# Patient Record
Sex: Male | Born: 1960
Health system: Southern US, Community
[De-identification: ages and names within clinical notes are randomized; demographics above are authoritative.]

## PROBLEM LIST (undated history)

## (undated) DIAGNOSIS — M1990 Unspecified osteoarthritis, unspecified site: Secondary | ICD-10-CM

## (undated) DIAGNOSIS — F419 Anxiety disorder, unspecified: Secondary | ICD-10-CM

## (undated) DIAGNOSIS — Z720 Tobacco use: Secondary | ICD-10-CM

## (undated) DIAGNOSIS — F32A Depression, unspecified: Secondary | ICD-10-CM

## (undated) DIAGNOSIS — F1910 Other psychoactive substance abuse, uncomplicated: Secondary | ICD-10-CM

## (undated) DIAGNOSIS — Z860100 Personal history of colon polyps, unspecified: Secondary | ICD-10-CM

## (undated) DIAGNOSIS — K219 Gastro-esophageal reflux disease without esophagitis: Secondary | ICD-10-CM

## (undated) DIAGNOSIS — F309 Manic episode, unspecified: Secondary | ICD-10-CM

## (undated) DIAGNOSIS — F329 Major depressive disorder, single episode, unspecified: Secondary | ICD-10-CM

## (undated) DIAGNOSIS — F319 Bipolar disorder, unspecified: Secondary | ICD-10-CM

## (undated) HISTORY — PX: TONSILLECTOMY: SUR1361

## (undated) HISTORY — DX: Tobacco use: Z72.0

## (undated) HISTORY — PX: EYE SURGERY: SHX253

## (undated) HISTORY — DX: Manic episode, unspecified: F30.9

## (undated) HISTORY — DX: Unspecified osteoarthritis, unspecified site: M19.90

## (undated) HISTORY — DX: Anxiety disorder, unspecified: F41.9

## (undated) HISTORY — DX: Major depressive disorder, single episode, unspecified: F32.9

## (undated) HISTORY — DX: Depression, unspecified: F32.A

## (undated) HISTORY — DX: Other psychoactive substance abuse, uncomplicated: F19.10

## (undated) HISTORY — DX: Bipolar disorder, unspecified: F31.9

## (undated) HISTORY — DX: Gastro-esophageal reflux disease without esophagitis: K21.9

## (undated) NOTE — *Deleted (*Deleted)
Patient ID: Austin Cobb, male   DOB: 07/04/1960, 54 y.o.   MRN: [id number]   After hospitalization 10/15-10/21/2021  From discharge summary: Disposition and follow-up:   [name] was discharged from [facility] in [place] condition.  At the hospital follow up visit please address:  1.  AoC HFrEF. TTE on 12/25/19 showing EF 25-30% with global hypokinesis, unchanged from prior. Not candidate for advanced interventions due to medication non-compliance and recent cocaine use. BNP 2812. Diuresed down to dry weight of 61.7kg. Discharged with Sherryll Burger 24-26mg  BID, spironolactone 25mg  daily, lasix 20mg  daily, and coreg 25mg  BID. Will f/u with cardiology, Dr. Bjorn Pippin at [facility], on January 03, 2020. Will establish new PCP at [facility] on January 12, 2020.  Atrial Flutter w/ variable blockade. On eliquis 5mg  BID at home. Initially loaded with digoxin, but due to medication non-compliance history, switched to coreg. Increased coreg to 25mg  BID before finally achieving adequate rate control. Will discharge with coreg 25mg  BID and eliquis 5mg  BID.  Enterobacter aerogenes UTI. Hx of E coli UTI. Initially had asymptomatic bacteriuria as patient was not complaining of symptoms. On 10/19, began complaining of urinary symptoms and had one episode of urethral bleeding (3cc). Repeat urinalysis showing positive nitrites, large leuks, many bacteria. Initially started on keflex 500mg  BID as urine culture showing >100,000 GNR, suspected to be E coli UTI due to history of E coli UTI. After discharge, found to be Enterobacter aerogenes. Will await susceptibilities before sending the appropriate abx to patient's local pharmacy.  Alcohol Use Disorder. Alcoholic cirrhosis vs alcoholic hepatitis. RUQ U/S consistent with possible cirrhosis, also showed dilated common bile duct (6.30mm). Required CIWA with ativan and librium for a couple of days during  admission, but tolerated quick taper. Has not required ativan for the past several days. Liver enzymes significantly decreased since admission. Emphasized avoidance of alcohol consumption.   AKI on CKDIIIa. Baseline creatinine variable between 1.1 - 1.4. Cr 1.53 on admission. Cr 1.37 on day of discharge.   2.  Labs / imaging needed at time of follow-up: CBC, CMP, repeat UA in 1 month.  3.  Pending labs/ test needing follow-up: NA   Hospital Course by problem list: 1.  AoC HFrEF. TTE on 12/25/19 showing EF 25-30% with global hypokinesis, unchanged from prior. Not candidate for advanced interventions due to medication non-compliance and recent cocaine use. BNP 2812. Diuresed down to dry weight of 61.7kg. Able to maintain euvolemia with lasix 20mg  daily and spironolactone 25mg  daily. Discharged with Entresto 24-26mg  BID, spironolactone 25mg  daily, lasix 20mg  daily, and coreg 25mg  BID. Will f/u with cardiology, Dr. Bjorn Pippin at [facility], on January 03, 2020. Will establish new PCP at [facility] on January 12, 2020.  Atrial Flutter w/ variable blockade. On eliquis 5mg  BID at home. Initially loaded with digoxin, but due to medication non-compliance history, switched to coreg. Increased coreg to 25mg  BID before finally achieving adequate rate control. Will discharge with coreg 25mg  BID and eliquis 5mg  BID.  Enterobacter aerogenes UTI. Hx of E coli UTI. Initially had asymptomatic bacteriuria as patient was not complaining of symptoms. On 10/19, began complaining of urinary symptoms and had one episode of urethral bleeding (3cc). Repeat urinalysis showing positive nitrites, large leuks, many bacteria. Initially started on keflex 500mg  BID as urine culture showing >100,000 GNR, suspected to be E coli UTI due to history of E coli UTI. After discharge, found to be Enterobacter aerogenes.  Will await susceptibilities before sending the appropriate abx to patient's local  pharmacy.  Alcohol Use Disorder. Alcoholic cirrhosis vs alcoholic hepatitis. RUQ U/S consistent with possible cirrhosis, also showed dilated common bile duct (6.21mm). Required CIWA with ativan and librium for a couple of days during admission, but tolerated quick taper. Has not required ativan for the past several days. Emphasized avoidance of alcohol consumption.   AKI on CKDIIIa. Baseline creatinine variable between 1.1 - 1.4. Cr 1.53 on admission. Cr 1.37 on day of discharge.

---

## 2005-01-16 ENCOUNTER — Inpatient Hospital Stay (HOSPITAL_COMMUNITY): Admission: EM | Admit: 2005-01-16 | Discharge: 2005-01-21 | Payer: Self-pay | Admitting: Emergency Medicine

## 2008-07-20 ENCOUNTER — Emergency Department (HOSPITAL_COMMUNITY): Admission: EM | Admit: 2008-07-20 | Discharge: 2008-07-20 | Payer: Self-pay | Admitting: Emergency Medicine

## 2010-05-09 ENCOUNTER — Encounter (INDEPENDENT_AMBULATORY_CARE_PROVIDER_SITE_OTHER): Payer: Self-pay | Admitting: *Deleted

## 2010-05-10 ENCOUNTER — Ambulatory Visit: Payer: Self-pay | Admitting: Cardiology

## 2010-05-17 NOTE — Letter (Signed)
Summary: Pre Visit Letter Revised  [facility]  [address] [place], [place] [address]   Phone: [phone]  Fax: [phone]        05/09/2010 MRN: [id number] Austin Cobb [address] [place], [place]  [address]             Procedure Date:  06/07/2010 @ 1:30   Direct colon-Dr. Arlyce Dice   Welcome to the Gastroenterology Division at [facility].    You are scheduled to see a nurse for your pre-procedure visit on 05/15/2010 at 1:30 on the 3rd floor at [facility], [address]  We ask that you try to arrive at our office 15 minutes prior to your appointment time to allow for check-in.  Please take a minute to review the attached form.  If you answer "Yes" to one or more of the questions on the first page, we ask that you call the person listed at your earliest opportunity.  If you answer "No" to all of the questions, please complete the rest of the form and bring it to your appointment.    Your nurse visit will consist of discussing your medical and surgical history, your immediate family medical history, and your medications.   If you are unable to list all of your medications on the form, please bring the medication bottles to your appointment and we will list them.  We will need to be aware of both prescribed and over the counter drugs.  We will need to know exact dosage information as well.    Please be prepared to read and sign documents such as consent forms, a financial agreement, and acknowledgement forms.  If necessary, and with your consent, a friend or relative is welcome to sit-in on the nurse visit with you.  Please bring your insurance card so that we may make a copy of it.  If your insurance requires a referral to see a specialist, please bring your referral form from your primary care physician.  No co-pay is required for this nurse visit.     If you cannot keep your appointment, please call [phone] to cancel or reschedule  prior to your appointment date.  This allows [place] the opportunity to schedule an appointment for another patient in need of care.    Thank you for choosing [facility] for your medical needs.  We appreciate the opportunity to care for you.  Please visit [place] at our website  to learn more about our practice.  Sincerely, The Gastroenterology Division

## 2010-05-30 ENCOUNTER — Ambulatory Visit (AMBULATORY_SURGERY_CENTER): Payer: Medicare Other | Admitting: *Deleted

## 2010-05-30 VITALS — Ht 70.0 in | Wt 172.0 lb

## 2010-05-30 DIAGNOSIS — Z1211 Encounter for screening for malignant neoplasm of colon: Secondary | ICD-10-CM

## 2010-05-30 DIAGNOSIS — M545 Low back pain, unspecified: Secondary | ICD-10-CM

## 2010-05-30 DIAGNOSIS — R109 Unspecified abdominal pain: Secondary | ICD-10-CM

## 2010-05-30 DIAGNOSIS — R1030 Lower abdominal pain, unspecified: Secondary | ICD-10-CM

## 2010-05-30 MED ORDER — PEG-KCL-NACL-NASULF-NA ASC-C 100 G PO SOLR: 1.0000 | Freq: Once | ORAL | Status: AC

## 2010-06-06 ENCOUNTER — Encounter: Payer: Self-pay | Admitting: Gastroenterology

## 2010-06-07 ENCOUNTER — Encounter: Payer: Self-pay | Admitting: Gastroenterology

## 2010-06-07 ENCOUNTER — Ambulatory Visit (AMBULATORY_SURGERY_CENTER): Payer: Medicare Other | Admitting: Gastroenterology

## 2010-06-07 VITALS — BP 122/90 | HR 73 | Temp 97.9°F | Resp 15 | Ht 70.0 in | Wt 170.0 lb

## 2010-06-07 DIAGNOSIS — D126 Benign neoplasm of colon, unspecified: Secondary | ICD-10-CM

## 2010-06-07 DIAGNOSIS — K635 Polyp of colon: Secondary | ICD-10-CM

## 2010-06-07 DIAGNOSIS — Z1211 Encounter for screening for malignant neoplasm of colon: Secondary | ICD-10-CM

## 2010-06-07 NOTE — Patient Instructions (Signed)
Discharge instructions reviewed  Repeat colonoscopy in 3 years  Education material on polyps given

## 2010-06-08 ENCOUNTER — Telehealth: Payer: Self-pay | Admitting: *Deleted

## 2010-06-08 NOTE — Telephone Encounter (Signed)

## 2010-06-12 ENCOUNTER — Encounter: Payer: Self-pay | Admitting: Gastroenterology

## 2010-06-12 NOTE — Procedures (Signed)
Summary: Colonoscopy  Patient: [name] Note: All result statuses are Final unless otherwise noted.  Tests: (1) Colonoscopy (COL)   COL Colonoscopy           DONE     [facility]     [address]     [place], [place]  [address]          COLONOSCOPY PROCEDURE REPORT          PATIENT:  Austin Cobb, Austin Cobb  MR#:  [id number]     BIRTHDATE:  December 27, 1960, 49 yrs. old  GENDER:  male          ENDOSCOPIST:  Barbette Hair. Arlyce Dice, MD     Referred by:  Marga Melnick, M.D.          PROCEDURE DATE:  06/07/2010     PROCEDURE:  Colonoscopy with snare polypectomy     ASA CLASS:  Class II     INDICATIONS:  1) Routine Risk Screening          MEDICATIONS:   Fentanyl 100 mcg IV, Versed 10 mg IV, Benadryl 50     mg IV          DESCRIPTION OF PROCEDURE:   After the risks benefits and     alternatives of the procedure were thoroughly explained, informed     consent was obtained.  Digital rectal exam was performed and     revealed no abnormalities.   The LB CF-[id number] endoscope     was introduced through the anus and advanced to the cecum, which     was identified by both the appendix and ileocecal valve, without     limitations.  The quality of the prep was excellent, using     MoviPrep.  The instrument was then slowly withdrawn as the colon     was fully examined.     <<PROCEDUREIMAGES>>          FINDINGS:  Three polyps were found (see image5, image6, and     image7)in the transverse colon. 3 10-40mm sessile polyps - removed     with hot polypectomy snare and submitted to pathology  A sessile     polyp was found in the sigmoid colon. It was 10 mm in size. It was     found 28 cm from the point of entry. Polyp was snared, then     cauterized with monopolar cautery. Retrieval was successful (see     image12). snare polyp  This was otherwise a normal examination of     the colon (see image2, image3, image10, image14, and image16).     Retroflexed views in the rectum  revealed no abnormalities.    The     time to cecum =  3.50  minutes. The scope was then withdrawn (time     =  11.75  min) from the patient and the procedure completed.          COMPLICATIONS:  None          ENDOSCOPIC IMPRESSION:     1) Three polyps in transverse colon     2) 10 mm sessile polyp in the sigmoid colon     3) Otherwise normal examination     RECOMMENDATIONS:     1) Colonoscopy in 3 years b/c of polyposis     2) Sedation with MAC for future procedures          REPEAT EXAM:   3 year(s) Colonoscopy  ______________________________     Barbette Hair Arlyce Dice, MD          CC:          n.     eSIGNED:   Barbette Hair. Guillermo Nehring at 06/07/2010 02:17 PM          Zigmund Gottron, [id number]  Note: An exclamation mark (!) indicates a result that was not dispersed into the flowsheet. Document Creation Date: 06/07/2010 2:17 PM _______________________________________________________________________  (1) Order result status: Final Collection or observation date-time: 06/07/2010 14:06 Requested date-time:  Receipt date-time:  Reported date-time:  Referring Physician:   Ordering Physician: Melvia Heaps [id number]) Specimen Source:  Source: Launa Grill Order Number: [id number] Lab site:

## 2010-06-19 LAB — RAPID URINE DRUG SCREEN, HOSP PERFORMED
Amphetamines: NOT DETECTED
Barbiturates: NOT DETECTED
Benzodiazepines: NOT DETECTED
Cocaine: NOT DETECTED
Opiates: NOT DETECTED
Tetrahydrocannabinol: NOT DETECTED

## 2010-06-19 LAB — CBC
HCT: 41.9 % (ref 39.0–52.0)
Hemoglobin: 14.7 g/dL (ref 13.0–17.0)
MCHC: 35.2 g/dL (ref 30.0–36.0)
MCV: 89.2 fL (ref 78.0–100.0)
Platelets: 217 10*3/uL (ref 150–400)
RBC: 4.7 MIL/uL (ref 4.22–5.81)
RDW: 14.3 % (ref 11.5–15.5)
WBC: 9.3 10*3/uL (ref 4.0–10.5)

## 2010-06-19 LAB — DIFFERENTIAL
Basophils Absolute: 0.1 10*3/uL (ref 0.0–0.1)
Basophils Relative: 1 % (ref 0–1)
Eosinophils Absolute: 0 10*3/uL (ref 0.0–0.7)
Eosinophils Relative: 0 % (ref 0–5)
Lymphocytes Relative: 14 % (ref 12–46)
Lymphs Abs: 1.3 10*3/uL (ref 0.7–4.0)
Monocytes Absolute: 0.5 10*3/uL (ref 0.1–1.0)
Monocytes Relative: 5 % (ref 3–12)
Neutro Abs: 7.4 10*3/uL (ref 1.7–7.7)
Neutrophils Relative %: 79 % — ABNORMAL HIGH (ref 43–77)

## 2010-06-19 LAB — COMPREHENSIVE METABOLIC PANEL
ALT: 38 U/L (ref 0–53)
AST: 106 U/L — ABNORMAL HIGH (ref 0–37)
Albumin: 4.5 g/dL (ref 3.5–5.2)
Alkaline Phosphatase: 84 U/L (ref 39–117)
BUN: 7 mg/dL (ref 6–23)
CO2: 24 mEq/L (ref 19–32)
Calcium: 9.4 mg/dL (ref 8.4–10.5)
Chloride: 94 mEq/L — ABNORMAL LOW (ref 96–112)
Creatinine, Ser: 1.08 mg/dL (ref 0.4–1.5)
GFR calc Af Amer: >60 mL/min (ref >60)
GFR calc non Af Amer: >60 mL/min (ref >60)
Glucose, Bld: 105 mg/dL — ABNORMAL HIGH (ref 70–99)
Potassium: UNDETERMINED mEq/L (ref 3.5–5.1)
Sodium: 133 mEq/L — ABNORMAL LOW (ref 135–145)
Total Bilirubin: 4 mg/dL — ABNORMAL HIGH (ref 0.3–1.2)
Total Protein: UNDETERMINED g/dL (ref 6.0–8.3)

## 2010-06-19 LAB — POTASSIUM: Potassium: 3.7 mEq/L (ref 3.5–5.1)

## 2010-06-19 LAB — ETHANOL: Alcohol, Ethyl (B): <5 mg/dL (ref 0–10)

## 2010-07-10 ENCOUNTER — Encounter: Payer: Self-pay | Admitting: Gastroenterology

## 2010-07-12 ENCOUNTER — Telehealth: Payer: Self-pay | Admitting: *Deleted

## 2010-07-12 NOTE — Telephone Encounter (Signed)
PT STATED PCP IS DAVID HOPPER NOT WILLIAM HOPPER

## 2010-07-12 NOTE — Telephone Encounter (Signed)
Message copied by Merri Ray on Thu Jul 12, 2010  8:59 AM ------      Message from: Melvia Heaps MD      Created: Wed Jul 11, 2010  4:04 PM       That's great news all around.        I will change the PCP      ----- Message -----         From: Pecola Lawless, MD         Sent: 07/11/2010  11:52 AM           To: Louis Meckel, MD            Please have your Nurse verify correct primary care MD. Last night our son Mardelle Matte won cook-off @ "[employer]). Bennette  Sings your praises. " I haven't felt this good & been able to eat well for years", she exclaimed. Thanks, [employer]

## 2010-07-27 NOTE — Discharge Summary (Signed)
Austin Cobb, Austin Cobb             ACCOUNT NO.:  [id number]   MEDICAL RECORD NO.:  [id number]          PATIENT TYPE:  INP   LOCATION:  1404                         FACILITY:  [facility]   PHYSICIAN:  Nelma Rothman, MD   DATE OF BIRTH:  October 30, 1960   DATE OF ADMISSION:  01/16/2005  DATE OF DISCHARGE:  01/21/2005                                 DISCHARGE SUMMARY   DISCHARGE DIAGNOSES:  1.  Alcohol withdrawal with delirium tremens.  2.  Hyponatremia.  3.  Hypokalemia.  4.  Thrombocytopenia likely secondary to alcohol use and cirrhosis.   PROCEDURES:  Abdominal ultrasound on November9 demonstrated prominent liver  size with probable hemangioma in the right lobe of the liver as well as a  contracted gallbladder which was thick-walled without ductal stones,  prominent pancreatic duct measuring 2.5 mm, and a minimal amount of ascites  surrounding the liver.   LABORATORY DATA:  Most recent CBC demonstrated a white blood cell count of  6.2, hemoglobin 11.3, hematocrit 33.1, and platelet count 79.  Reticulocyte  count 1% sodium improved from 123 on admission to 133 in the morning prior  to discharge.  Potassium at that time was 3.2, and creatinine was 0.8.  Liver function tests demonstrated a total bilirubin of 1.8 of which 1.5 was  fractionated as indirect bilirubin, alkaline phosphatase 59, AST 54, ALT 26,  total protein 5.8, albumin 2.7, calcium 7.6.  An LDH level was 173.  TSH  1.550, total iron was 73 with a total iron-binding capacity of 194.  Vitamin  B12 level 549 and folate greater than 20.  Alpha-fetoprotein was 1.7 which  is within normal limits, and a haptoglobin level was 158 which is within  normal limits.  Hepatitis C antibody was negative as was hepatitis B E  antibody.  An alcohol level on admission was less than five.  Urine culture  was negative.   HISTORY AND PHYSICAL:  Austin Cobb is a 50 year old homeless man with  alcoholism who was picked up by the police after he was  found by a bypasser  wandering [place] the side of the highway, appearing confused.   HOSPITAL COURSE AS ARRANGED BY SYSTEMS:  1.  Alcohol withdrawal with delirium tremens.  The patient relates a      longstanding history of alcoholism.  His alcohol level on admission was      undetectable.  He stated that he did not have any money to buy alcohol      for the prior 24 hours, which was [place].  When he was admitted, he was placed on the Ativan      withdrawal protocol, but over the subsequent 48 hours was quite      combative and noncompliant.  His presentation was felt most consistent      with delirium tremens.  He was managed with Ativan standing and as      needed.  This gradually improved over the course of his hospitalization,      as his Ativan was slowly tapered.  2.  Hyponatremia.  This, too, improved over the course of his      hospitalization and was likely secondary to dehydration.  3.  Hepatic hemangioma.  Ultrasound performed on admission demonstrated a      probable hepatic hemangioma, but given his longstanding history of      alcohol use, could not entirely rule out a liver mass.  As the patient      was unable to tolerate an MRI secondary to severe agitation during      delirium tremens, an alpha-fetoprotein level was checked for screening      for hepatocellular carcinoma, which was within normal limits.  This was      felt more likely consistent with a hepatic hemangioma.  It was felt that      he may beneift from having a follow-up MRI performed once his agitation      had improved such that he would tolerate an MRI; however, the patient      left the hosptial prior to this being able to be performed.  4.  Disposition.  Austin Cobb agitation improved greatly over his 6 days      of hospitalization and, in fact, the day prior to discharge, he was      actually quite pleasant, and I had a long conversation with him about       abstinence from alcohol.  He, at that time, declined any assistance in      alcohol cessation.  He strongly wished to be discharged back to the      street where he stated he would start drinking again, as it is the only      thing that makes life tolerable for him.  That evening, I strongly      suggested that he remain in the hospital until our social workers were      able to arrange for possible inpatient alcohol detox and treatment, as      well as mental health evaluation.  He agreed at that time, but it      appears from review of the chart at approximately 2 o'clock a.m. the      following morning, the patient left the hospital against medical advice.      Nelma Rothman, MD  Electronically Signed     RAR/MEDQ  D:  04/01/2005  T:  04/02/2005  Job:  [id number]

## 2010-07-27 NOTE — H&P (Signed)
NAME:  Austin Cobb, Austin Cobb             ACCOUNT NO.:  [id number]   MEDICAL RECORD NO.:  [id number]          PATIENT TYPE:  EMS   LOCATION:  ED                           FACILITY:  [facility]   PHYSICIAN:  Mobolaji B. Bakare, M.D.DATE OF BIRTH:  [birth year]   DATE OF ADMISSION:  01/16/2005  DATE OF DISCHARGE:                                HISTORY & PHYSICAL   PRIMARY CARE PHYSICIAN:  Unassigned.   CHIEF COMPLAINT:  DTs.   HISTORY OF PRESENTING COMPLAINT:  Austin Cobb is a 50 year old male [place] He drinks mainly beer about five to six times  a day. His last drink was yesterday evening. He stated that he went to bed  and woke up this morning with shakiness and pain in his body. He did not  have any alcohol to drink; hence, he migrated to the highway. He was seen by  hematologist who notified 9-1-1 and the patient was picked by EMS.   The patient states that he drinks alcohol to keep his arthritis in check. He  states he has osteoarthritis involving his neck, knees. He denies any  vomiting, abdominal pain, and nausea. He has been having loose BMs  two  times today, but no abdominal pain. He denies chest pain, shortness of  breath, headaches, dysuria, urgency, or increased frequency or micturition.  There is no skin rash.   REVIEW OF SYSTEMS:  As in the HPI.   PAST MEDICAL HISTORY:  Osteoarthritis involving the knees.   PAST SURGICAL HISTORY:  None.   MEDICATIONS:  None.   ALLERGIES:  No known drug allergies.   SOCIAL HISTORY:  The patient has been drinking alcohol on and off since  childhood and he drinks mainly beer; currently drinks about 5 cans per day.  He smokes cigarettes one pack per day and has been smoking since childhood.  He lives on social security. He is homeless. He buys his food from a  convenience store. He [place] [address]. He denies any  drug use.   FAMILY HISTORY:  His parents are in [place]. He does not have any  family  members here in [place]. There is no known family history of heart  disease or stroke.   PHYSICAL EXAMINATION:  VITAL SIGNS: Blood pressure 164/99, temperature 99.0,  pulse 87, respiratory rate 20, O2 saturations of 98% on room air.  GENERAL: The patient is alert, oriented to time, place, and person. He is  shaking all over.  Not in respiratory distress.  HEENT: Normocephalic and atraumatic head. He appears very unkempt. Mucous  membranes moist.  NECK: No elevated JVD, no carotid bruits.  LUNGS: Clear clinically to auscultation.  CVS:  S1 and S2 regular. No murmurs, rubs, or gallops.  ABDOMEN: Nondistended, soft, nontender. No palpable organomegaly. Bowel  sounds present.  EXTREMITIES: No pedal edema or calf tenderness. Dorsalis pedis pulses 2+  bilaterally.  CNS: No focal neurologic deficits. Muscle power 5/5 in all limbs.  SKIN: Very dry and he has hyperpigmentation in the lower extremities. No  rash or petechia.  INITIAL LABORATORY DATA:  White cells 7.6, hemoglobin 12.4, hematocrit 36.1,  MCV 93.3, platelet count 61,000, neutrophils 86%, lymphocytes 6%. Sodium  123, potassium 3.1, chloride 89, bicarbonate 22, glucose 98, BUN 8,  creatinine 0.9, bilirubin 2.4, alkaline phosphatase 76, AST 62, ALT 33,  total protein 6.8, albumin 3.3, calcium 8.2, alcohol less than 5. Urinalysis  revealed specific gravity of 0.22, large blood, trace leukocytes, negative  for nitrates, protein 100. Microscopy insignificant, except for hyaline  cast. Urine drug screen negative.   ASSESSMENT:  Austin Cobb is a 50 year old male presenting with alcohol  withdrawal symptoms. He is homeless and has not seen a physician in several  years.  1.  Alcohol withdrawal syndrome.  2.  Hyponatremia probably secondary to alcohol.  3.  Hypokalemia.  4.  Anemia.  5.  Thrombocytopenia most likely secondary to alcohol.  6.  Abnormal transaminase and elevated bilirubin.  7.  Alcohol abuse.  8.  Tobacco  abuse.  9.  Elevated blood pressure.  10. Homelessness.   PLAN:  1.  Will admit to telemetry, start Ativan withdrawal protocol. We gave      thiamine 100 mg p.o. daily, multivitamin one p.o. daily, folic acid 1 mg      p.o. daily, Vicodin one to two p.o. q.4h. p.r.n. pain, IV fluid normal      saline at 100 mL per hour plus 20 mEq of Kay Ciel.  2.  Will replete potassium with 40 mEq of Kay Ciel p.o. times one now.  3.  Check anemia panel, stool hemoccult, obtain ultrasound of gallbladder      and biliary tree to evaluate for liver cirrhosis and ascites.  4.  Will initiate tobacco cessation and alcohol cessation counseling.  Check      hepatitis B and C serologies. Check serum osmolality, urine osmolality,      and urine sodium. Will check stool culture and Clostridium difficile if      loose stool persists. I anticipate that elevated blood pressure will      improve with control of DTs.  He may require beta blockers.      Mobolaji B. Corky Downs, M.D.  Electronically Signed     MBB/MEDQ  D:  01/16/2005  T:  01/16/2005  Job:  [id number]

## 2011-06-25 ENCOUNTER — Ambulatory Visit (INDEPENDENT_AMBULATORY_CARE_PROVIDER_SITE_OTHER): Payer: Medicare Other | Admitting: Family Medicine

## 2011-06-25 VITALS — BP 135/85 | HR 89 | Temp 98.4°F | Resp 18 | Ht 69.5 in | Wt 160.0 lb

## 2011-06-25 DIAGNOSIS — K219 Gastro-esophageal reflux disease without esophagitis: Secondary | ICD-10-CM

## 2011-06-25 DIAGNOSIS — R079 Chest pain, unspecified: Secondary | ICD-10-CM

## 2011-06-25 DIAGNOSIS — IMO0001 Reserved for inherently not codable concepts without codable children: Secondary | ICD-10-CM

## 2011-06-25 NOTE — Progress Notes (Signed)
[facility]:  Office Visit  Chief Complaint:  Chief Complaint  Patient presents with  . Chest Pain    off and on x 3 years light "bing" pain on both sides of chest  . Mass    off and on x 3 years front of neck    HPI: Austin Cobb is a 51 y.o. male who complains of: 1. Wants a PE-would like his PSA drawn.  2. Has anterior chest pain, not localized, moving, intermittnet depending on movement every 1-2 times a month. Also depends on stress level. He states " that everythime he argues with government about social security for his wife" he gets chest pain. He has had chest pain like this before. He has a history of reflux and alcohol use. Drinks 3-4 24 oz malt liquor daily, stopped drinking hard liquor because can't afford it. Denies use of marijuan, crack cocaine, meth, IVDA. Patient states that he has not tried anything for this except Tums which works when he has CP. He would like a chest xray. Denies all CAGE questions. Has never had blackouts or DTs.  3. Patient wants to know if he can have me fill out paperwork for declaring capacity. He wants to take his "girlfriend/wife off " as Barrister's clerk for social security benefits. "this is the main reason why I am here" he states. He was told by social security admin that he can get his PCP to write a letter of " medical capability" to get his girlfriend off his ss benefits since she is also getting ss benefits soon and can't be on his as a beneficiary if she is getting her own. The patient has a history of anxiety/depression and bipolar which he is not being actively treated for. 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   Past Medical History  Diagnosis Date  . Arthritis   . Anxiety   . Depression History of Bipolar    not on meds  . GERD (gastroesophageal reflux disease)   . Substance abuse     72yrs ago smoke crack and marjuana  . Bipolar 1 disorder   . Manic disorder    Past  Surgical History  Procedure Date  . Eye surgery     bilateral eye sx as a child [age]yrs old.   History   Social History  . Marital Status: Divorced    Spouse Name: N/A    Number of Children: N/A  . Years of Education: N/A   Social History Main Topics  . Smoking status: Current Everyday Smoker -- 1.0 packs/day for 25 years    Types: Cigarettes  . Smokeless tobacco: Never Used  . Alcohol Use: 7.2 oz/week    12 Cans of beer per week  . Drug Use: No  . Sexually Active: None   Other Topics Concern  . None   Social History Narrative  . None   Family History  Problem Relation Age of Onset  . Colon polyps Father   . Diabetes Father   . Diabetes Brother    No Known Allergies Prior to Admission medications   Medication Sig Start Date End Date Taking? Authorizing Provider  aspirin 81 MG tablet Take 81 mg by mouth daily.   Yes Historical Provider, MD  diphenhydramine-acetaminophen (TYLENOL PM) 25-500 MG TABS Take 2 tablets by mouth 3 (three) times daily. As needed for pain    Yes Historical Provider, MD  ROS: Currently The patient denies fevers, chills, night sweats, unintentional weight loss, diaphoresis, dizziness, palpitations, wheezing, dyspnea on exertion, nausea, vomiting, abdominal pain, dysuria, hematuria, melena, numbness, weakness, or tingling. Denies thoughts of SI/HI/hallucinations.  All other systems have been reviewed and were otherwise negative with the exception of those mentioned in the HPI and as above.    PHYSICAL EXAM: Filed Vitals:   06/25/11 1114  BP: 135/85  Pulse: 89  Temp: 98.4 F (36.9 C)  Resp: 18   Filed Vitals:   06/25/11 1114  Height: 5' 9.5" (1.765 m)  Weight: 160 lb (72.576 kg)   Body mass index is 23.29 kg/(m^2).  General: Alert, no acute distress HEENT:  Normocephalic, atraumatic, oropharynx patent.  Cardiovascular:  Regular rate and rhythm, no rubs murmurs or gallops.  No Carotid bruits, radial pulse intact. No pedal edema.    Respiratory: Clear to auscultation bilaterally.  No wheezes, rales, or rhonchi.  No cyanosis, no use of accessory musculature GI: No organomegaly, abdomen is soft and non-tender, positive bowel sounds.  No masses. Skin: No rashes. Neurologic: Facial musculature symmetric. Tangential train of thought and conversation. Mood is appropriate. Makes eye contact Psychiatric: Patient is appropriate throughout our interaction. Lymphatic: No cervical lymphadenopathy Musculoskeletal: Gait intact.   LABS:  EKG/XRAY:   Primary read interpreted by Dr. Conley Rolls at [facility].   ASSESSMENT/PLAN: Encounter Diagnosis  Name Primary?  . Reflux Yes   Continue with Tums prn for GERD, Decrease Alcohol use.  If have worsening s/sx of  CP/SOB then go to ER.  Patient wants me to declare him capable of making his own medical decisions-I do not feel comfortable with his untreated dx of bipolar and anxiety/depression to be able to make  That decision based on this one time initial appt. I have given him a list of psychiatrist to call to see if he can get an appointment with them so that psych can make the " capacity" decision. Also advise him to call the back of his insurance card to see which psychiatrist would take his insurance if the list I gave him does not take his insurance. He needs to have appropriate paperwork from [facility] for that upcoming appointment.     Hamilton Capri PHUONG, DO 06/25/2011 12:34 PM

## 2011-12-26 ENCOUNTER — Ambulatory Visit (INDEPENDENT_AMBULATORY_CARE_PROVIDER_SITE_OTHER): Payer: Medicare Other | Admitting: Family Medicine

## 2011-12-26 VITALS — BP 130/88 | HR 93 | Temp 98.1°F | Resp 16 | Ht 71.4 in | Wt 159.6 lb

## 2011-12-26 DIAGNOSIS — M549 Dorsalgia, unspecified: Secondary | ICD-10-CM

## 2011-12-26 MED ORDER — NABUMETONE 500 MG PO TABS: 500.0000 mg | ORAL_TABLET | Freq: Two times a day (BID) | ORAL | Status: DC

## 2011-12-26 NOTE — Progress Notes (Signed)
[facility]:  Office Visit  Chief Complaint:  Chief Complaint  Patient presents with  . Rectal Pain  . Back Pain    HPI: Austin Cobb is a 51 y.o. male who complains of  Last week since Friday has had discomfort in rectal area, had constipation then diarrhea, has been taking tylenol pm.  This is better. Now has back pain from all of this. He can't make his bed. He has a problem with nerve damage. Denies any numbness, tingling, Denies hematuria, melena or brbpr. Rectal pain now 90% gone. Is drinking malt and liquor daily and still taking tylenol.  Past Medical History  Diagnosis Date  . Arthritis   . Anxiety   . Depression History of Bipolar    not on meds  . GERD (gastroesophageal reflux disease)   . Substance abuse     34yrs ago smoke crack and marjuana  . Bipolar 1 disorder   . Manic disorder    Past Surgical History  Procedure Date  . Eye surgery     bilateral eye sx as a child [age]yrs old.   History   Social History  . Marital Status: Divorced    Spouse Name: N/A    Number of Children: N/A  . Years of Education: N/A   Social History Main Topics  . Smoking status: Current Every Day Smoker -- 1.0 packs/day for 25 years    Types: Cigarettes  . Smokeless tobacco: Never Used  . Alcohol Use: 7.2 oz/week    12 Cans of beer per week  . Drug Use: No  . Sexually Active: Not on file   Other Topics Concern  . Not on file   Social History Narrative  . No narrative on file   Family History  Problem Relation Age of Onset  . Colon polyps Father   . Diabetes Father   . Diabetes Brother    No Known Allergies Prior to Admission medications   Medication Sig Start Date End Date Taking? Authorizing Provider  aspirin 81 MG tablet Take 81 mg by mouth daily.   Yes Historical Provider, MD  diphenhydramine-acetaminophen (TYLENOL PM) 25-500 MG TABS Take 2 tablets by mouth 3 (three) times daily. As needed for pain    Yes Historical Provider, MD     ROS:  The patient denies fevers, chills, night sweats, unintentional weight loss, chest pain, palpitations, wheezing, dyspnea on exertion, nausea, vomiting, abdominal pain, dysuria, hematuria, melena, numbness, weakness, or tingling.  All other systems have been reviewed and were otherwise negative with the exception of those mentioned in the HPI and as above.    PHYSICAL EXAM: Filed Vitals:   12/26/11 1504  BP: 130/88  Pulse: 93  Temp: 98.1 F (36.7 C)  Resp: 16   Filed Vitals:   12/26/11 1504  Height: 5' 11.4" (1.814 m)  Weight: 159 lb 9.6 oz (72.394 kg)   Body mass index is 22.01 kg/(m^2).  General: Alert, no acute distress HEENT:  Normocephalic, atraumatic, oropharynx patent.  Cardiovascular:  Regular rate and rhythm, no rubs murmurs or gallops.  No Carotid bruits, radial pulse intact. No pedal edema.  Respiratory: Clear to auscultation bilaterally.  No wheezes, rales, or rhonchi.  No cyanosis, no use of accessory musculature GI: No organomegaly, abdomen is soft and non-tender, positive bowel sounds.  No masses. Skin: No rashes. Neurologic: Facial musculature symmetric. Psychiatric: Patient is appropriate throughout our interaction. Lymphatic: No cervical lymphadenopathy Musculoskeletal: Gait intact. Back, Right and Left Shoulder ROM intact  5/5 strength Sensation intact 2/2 DTR   LABS: Results for orders placed during the hospital encounter of 07/20/08  DRUG SCREEN PANEL, EMERGENCY      Component Value Range   Opiates NONE DETECTED  NONE DETECTED   Cocaine NONE DETECTED  NONE DETECTED   Benzodiazepines NONE DETECTED  NONE DETECTED   Amphetamines NONE DETECTED  NONE DETECTED   Tetrahydrocannabinol NONE DETECTED  NONE DETECTED   Barbiturates    NONE DETECTED   Value: NONE DETECTED            DRUG SCREEN FOR MEDICAL PURPOSES     ONLY.  IF CONFIRMATION IS NEEDED     FOR ANY PURPOSE, NOTIFY LAB     WITHIN 5 DAYS.                LOWEST DETECTABLE LIMITS     FOR URINE  DRUG SCREEN     Drug Class       Cutoff (ng/mL)     Amphetamine      1000     Barbiturate      200     Benzodiazepine   200     Tricyclics       300     Opiates          300     Cocaine          300     THC              50  ETHANOL      Component Value Range   Alcohol, Ethyl (B)    0 - 10 mg/dL   Value: <5            LOWEST DETECTABLE LIMIT FOR     SERUM ALCOHOL IS 5 mg/dL     FOR MEDICAL PURPOSES ONLY  CBC      Component Value Range   WBC 9.3  4.0 - 10.5 K/uL   RBC 4.70  4.22 - 5.81 MIL/uL   Hemoglobin 14.7  13.0 - 17.0 g/dL   HCT 09.8  11.9 - 14.7 %   MCV 89.2  78.0 - 100.0 fL   MCHC 35.2  30.0 - 36.0 g/dL   RDW 82.9  56.2 - 13.0 %   Platelets 217  150 - 400 K/uL  COMPREHENSIVE METABOLIC PANEL      Component Value Range   Sodium 133 (*) 135 - 145 mEq/L   Potassium UNABLE TO DETERMINE DUE TO HEMOLYSIS  3.5 - 5.1 mEq/L   Chloride 94 (*) 96 - 112 mEq/L   CO2 24  19 - 32 mEq/L   Glucose, Bld 105 (*) 70 - 99 mg/dL   BUN 7  6 - 23 mg/dL   Creatinine, Ser 8.65  0.4 - 1.5 mg/dL   Calcium 9.4  8.4 - 78.4 mg/dL   Total Protein UNABLE TO DETERMINE DUE TO HEMOLYSIS  6.0 - 8.3 g/dL   Albumin 4.5  3.5 - 5.2 g/dL   AST 696 (*) 0 - 37 U/L   ALT 38  0 - 53 U/L   Alkaline Phosphatase 84  39 - 117 U/L   Total Bilirubin 4.0 MARKED HEMOLYSIS (*) 0.3 - 1.2 mg/dL   GFR calc non Af Amer >60  >60 mL/min   GFR calc Af Amer    >60 mL/min   Value: >60            The eGFR has been calculated  using the MDRD equation.     This calculation has not been     validated in all clinical     situations.     eGFR's persistently     <60 mL/min signify     possible Chronic Kidney Disease.  DIFFERENTIAL      Component Value Range   Neutrophils Relative 79 (*) 43 - 77 %   Neutro Abs 7.4  1.7 - 7.7 K/uL   Lymphocytes Relative 14  12 - 46 %   Lymphs Abs 1.3  0.7 - 4.0 K/uL   Monocytes Relative 5  3 - 12 %   Monocytes Absolute 0.5  0.1 - 1.0 K/uL   Eosinophils Relative 0  0 - 5 %   Eosinophils  Absolute 0.0  0.0 - 0.7 K/uL   Basophils Relative 1  0 - 1 %   Basophils Absolute 0.1  0.0 - 0.1 K/uL  POTASSIUM      Component Value Range   Potassium 3.7  3.5 - 5.1 mEq/L     EKG/XRAY:   Primary read interpreted by Dr. Conley Rolls at [facility].   ASSESSMENT/PLAN: Encounter Diagnosis  Name Primary?  . Back pain Yes   His rectal pain is improved with Tylenol PM. He declined a rectal exam. I advise him that he should decrease his Tylenol intake to less than 3grams daily since he drinks malt liquor on a regular basis.  Rx Relafen for chronic back pain and arthritis F/u prn    Aliza Moret PHUONG, DO 12/26/2011 4:17 PM

## 2011-12-27 ENCOUNTER — Telehealth: Payer: Self-pay

## 2011-12-27 NOTE — Telephone Encounter (Signed)
Pt states he saw dr le recently for GI issues. States he was here for a very long time and didn't ask for an antibiotic or pain meds because he was too exhausted by the end of his appt and didn't remember.  Cannot afford to rtc and doesn't have reliable transportation. [facility] [address].  ° °Please call for clarification  °Best: [phone] ° °bf °

## 2011-12-27 NOTE — Telephone Encounter (Signed)
Patient called again and is irate that he has not heard from [place] yet. I explained procedure and he says he cannot wait 24-48 hours, he needs this by 5pm today. I made no guarantees and he is not happy.  Best # [phone]  [facility] - , [place] - [address]

## 2011-12-27 NOTE — Telephone Encounter (Signed)
Pt states he saw dr Conley Rolls recently for GI issues. States he was here for a very long time and didn't ask for an antibiotic or pain meds because he was too exhausted by the end of his appt and didn't remember.  Cannot afford to rtc and doesn't have reliable transportation. Would like antibiotic[facility] [address].   Please call for clarification  Best: [phone]  bf

## 2011-12-28 NOTE — Telephone Encounter (Signed)
I do not see where antibiotics are indicated for his CC. He was given pain medication at his office visit. Is this not helping?

## 2011-12-30 ENCOUNTER — Telehealth: Payer: Self-pay | Admitting: Family Medicine

## 2011-12-30 NOTE — Telephone Encounter (Signed)
Spoke with patient he is very very upset cussing and helling want to speak with Dr Perrin Maltese or Dr Katrinka Blazing states that he wasn't tested correctly and how does Dr Conley Rolls know if he DOESN'T need pain medicine or antibiotics and that were getting worst as a practice please respond to patient I have apologized over and over and told him that I will get someone to respond to this message

## 2011-12-30 NOTE — Telephone Encounter (Signed)
PT STATES HE WANTS A CALL BACK FROM DR Alen Bleacher

## 2011-12-30 NOTE — Telephone Encounter (Signed)
Patient still upset, advised no antibiotic indicated. Advised he is welcome to come back in, he is still angry, he disconnected call. He states he should have not seend Dr Conley Rolls,

## 2011-12-30 NOTE — Telephone Encounter (Signed)
Spoke with patient -- evaluated on Thursday, 12/26/11 by Dr. Conley Rolls.  Having back, shoulder,rectal pain.  Waited 3 hours; took three generic Tylenol PM pills before office visit.  Rectal pain had greatly improved with Tylenol PM.  Pain returned after Tylenol PM wore off.  Initially wanted rectal exam but after waiting three hours declined rectal exam; rectal pain had improved with Tylenol PM.  Similar rectal symptoms in 1997 but spontaneously resolved.  Had prostate exam by Dr. Alwyn Ren in past six months so does not feel he is suffering with prostate infection.  S/p lumbar spine films; rx for Relafen provided.  Called back earlier today requesting antibiotic for "general infection".

## 2011-12-30 NOTE — Telephone Encounter (Signed)
See previous message.  A/P: Recurrent rectal pain.  Patient advised that he must return to [facility] for reevaluation.  We are unable to prescribe antibiotics for a generalized infection.  Warrants rectal exam, prostate exam to rule out rectal abscess, hemorrhoid pathology, prostatitis, etc. May also warrant u/a.  Also advised patient that he must advise [place] at each office visit that he prefers a male provider.  No new rx provided.  Patient expressed understanding and thanked caller for returning his call and clarifying issues.

## 2011-12-30 NOTE — Telephone Encounter (Signed)
The patient called very irate and rude, stating he needs to speak to Dr. Perrin Maltese to "get anything done here".  The patient stated he was seen by Dr. Conley Rolls who should not have seen him because she is male and men should only see male providers.  This Clinical research associate asked the patient if he advised anyone of his concerns at the time, and the patient stated he was too tired from taking 3 Tylenol PMs before coming to office.  The patient stated that it is an unwritten rule that male patients do not see male providers.  The patient stated that he does not have enough money to return to office, pay for bus fair and prescriptions as well.  The patient wants a resolution to this as soon as possible, the patient stated he is in a lot of pain.  Please call the patient at [phone].

## 2012-01-03 ENCOUNTER — Ambulatory Visit (INDEPENDENT_AMBULATORY_CARE_PROVIDER_SITE_OTHER): Payer: Medicare Other | Admitting: Internal Medicine

## 2012-01-03 VITALS — BP 138/88 | HR 72 | Temp 98.2°F | Resp 18 | Ht 70.0 in | Wt 157.0 lb

## 2012-01-03 DIAGNOSIS — F418 Other specified anxiety disorders: Secondary | ICD-10-CM | POA: Insufficient documentation

## 2012-01-03 DIAGNOSIS — G8929 Other chronic pain: Secondary | ICD-10-CM | POA: Insufficient documentation

## 2012-01-03 DIAGNOSIS — M542 Cervicalgia: Secondary | ICD-10-CM | POA: Insufficient documentation

## 2012-01-03 DIAGNOSIS — R197 Diarrhea, unspecified: Secondary | ICD-10-CM

## 2012-01-03 DIAGNOSIS — K6289 Other specified diseases of anus and rectum: Secondary | ICD-10-CM

## 2012-01-03 DIAGNOSIS — F39 Unspecified mood [affective] disorder: Secondary | ICD-10-CM | POA: Insufficient documentation

## 2012-01-03 MED ORDER — CIPROFLOXACIN HCL 500 MG PO TABS: 500.0000 mg | ORAL_TABLET | Freq: Two times a day (BID) | ORAL | Status: DC

## 2012-01-03 MED ORDER — HYDROCORTISONE ACETATE 25 MG RE SUPP: 25.0000 mg | Freq: Two times a day (BID) | RECTAL | Status: DC

## 2012-01-03 NOTE — Progress Notes (Signed)
  Subjective:    Patient ID: Austin Cobb, male    DOB: April 09, 1960, 51 y.o.   MRN: [id number]  HPI See 10/17 not better Still paincontinuously--sharp shooting plus always dull ache Diarrhea every other day 1-2 times for past 7d Pain started 14d w/out any diarrhea Then solid on other days 97 same proctalgia ? Why lasted 2-3 d went away w/out Rx No hematochezia or melena No N/V Eats well No fever colonos 2011 or 12 Then needed f/u polypectomy  Pmh= Patient Active Problem List  Diagnosis  . Chronic shoulder pain  . Neck pain, chronic  . Mood disorder  . Depression with anxiety  Disabled  Review of Systems No fever chills or night sweats No weight loss No anal intercourse No male partners No history of HIV C. History of substance abuse/none for yearsx4    Objective:   Physical Exam Vital signs stable Abdomen soft nontender and nondistended with no organomegaly or masses Rectal exam with no external hemorrhoids Stool is liquid and brown Hemoccult is positive Prostate is small without nodules or tenderness There are no rectal masses No internal hemorrhoids palpated     Results for orders placed in visit on 01/03/12  IFOBT (OCCULT BLOOD)      Component Value Range   IFOBT Positive        Assessment & Plan:  Problem #1 proctalgia Problem #2 recurrent diarrhea Problem #3 heme-positive stool Problem #4 history of internal hemorrhoids by colonoscopy  Plan Meds ordered this encounter  Medications  . ciprofloxacin (CIPRO) 500 MG tablet    Sig: Take 1 tablet (500 mg total) by mouth 2 (two) times daily.    Dispense:  20 tablet    Refill:  0  . hydrocortisone (ANUSOL-HC) 25 MG suppository    Sig: Place 1 suppository (25 mg total) rectally 2 (two) times daily.    Dispense:  12 suppository    Refill:  0   Stool culture done Followup in 10-14 days

## 2012-01-03 NOTE — Patient Instructions (Addendum)
cipro for infection anusol suppos for hemorrhoids

## 2012-01-06 ENCOUNTER — Encounter: Payer: Self-pay | Admitting: Internal Medicine

## 2012-01-06 LAB — WOUND CULTURE

## 2012-01-19 ENCOUNTER — Telehealth: Payer: Self-pay

## 2012-01-19 MED ORDER — HYDROCORTISONE ACETATE 25 MG RE SUPP: 25.0000 mg | Freq: Two times a day (BID) | RECTAL | Status: DC

## 2012-01-19 NOTE — Telephone Encounter (Signed)
Pt notified that rx was sent in 

## 2012-01-19 NOTE — Telephone Encounter (Signed)
The patient called to request refill for [facility] 25mg  suppository.  The patient stated that he ran out of suppositories last evening and that he ran out early due to a bout of diarrhea.  Please call the patient at [phone].  The patient stated that he does not have the money to be seen again and does not have a car to get here.  The patient would like the rx called into to the [facility] on [address]

## 2012-01-19 NOTE — Telephone Encounter (Signed)
Done for patient.

## 2012-02-03 ENCOUNTER — Encounter (HOSPITAL_COMMUNITY): Payer: Self-pay | Admitting: *Deleted

## 2012-02-03 ENCOUNTER — Emergency Department (HOSPITAL_COMMUNITY)
Admission: EM | Admit: 2012-02-03 | Discharge: 2012-02-04 | Disposition: A | Discharge status: Home / Self Care | Payer: Medicare Other | Attending: Emergency Medicine | Admitting: Emergency Medicine

## 2012-02-03 DIAGNOSIS — F17200 Nicotine dependence, unspecified, uncomplicated: Secondary | ICD-10-CM | POA: Insufficient documentation

## 2012-02-03 DIAGNOSIS — K649 Unspecified hemorrhoids: Secondary | ICD-10-CM

## 2012-02-03 DIAGNOSIS — K644 Residual hemorrhoidal skin tags: Secondary | ICD-10-CM | POA: Insufficient documentation

## 2012-02-03 DIAGNOSIS — F411 Generalized anxiety disorder: Secondary | ICD-10-CM | POA: Insufficient documentation

## 2012-02-03 DIAGNOSIS — F3289 Other specified depressive episodes: Secondary | ICD-10-CM | POA: Insufficient documentation

## 2012-02-03 DIAGNOSIS — M129 Arthropathy, unspecified: Secondary | ICD-10-CM | POA: Insufficient documentation

## 2012-02-03 DIAGNOSIS — K219 Gastro-esophageal reflux disease without esophagitis: Secondary | ICD-10-CM | POA: Insufficient documentation

## 2012-02-03 DIAGNOSIS — K602 Anal fissure, unspecified: Secondary | ICD-10-CM | POA: Insufficient documentation

## 2012-02-03 DIAGNOSIS — F3160 Bipolar disorder, current episode mixed, unspecified: Secondary | ICD-10-CM | POA: Insufficient documentation

## 2012-02-03 DIAGNOSIS — F329 Major depressive disorder, single episode, unspecified: Secondary | ICD-10-CM | POA: Insufficient documentation

## 2012-02-03 DIAGNOSIS — Z79899 Other long term (current) drug therapy: Secondary | ICD-10-CM | POA: Insufficient documentation

## 2012-02-03 NOTE — ED Notes (Signed)
Pt reports rectal bleeding that began tonight.  Pt reports pain when he sits down.  Blood was noted on the blue pad.  Pt talks very fast but thoughts are coherent.

## 2012-02-03 NOTE — ED Notes (Signed)
Per EMS- pt in c/o rectal pain and rectal infection. Pt has been seeing urgent cares and various ED's for this over the last several weeks. Pt today noted bright red blood in his underwear, and then noted some in his stool. Pt states this is new. No relief with suppositories and tylenol. Noncompliant with his antibiotics.

## 2012-02-03 NOTE — ED Notes (Signed)
[id number]<AV> Expected date:<BR> Expected time:<BR> Means of arrival:<BR> Comments:<BR> Hold for [place]

## 2012-02-04 MED ORDER — LIDOCAINE HCL 2 % EX GEL
CUTANEOUS | Status: DC | PRN
  Administered 2012-02-04: 10 via TOPICAL
  Filled 2012-02-04: qty 10

## 2012-02-04 MED ORDER — LIDOCAINE (ANORECTAL) 5 % EX CREA: TOPICAL_CREAM | CUTANEOUS | Status: DC

## 2012-02-04 NOTE — ED Provider Notes (Signed)
History     CSN: [id number]  Arrival date & time 02/03/12  2317   First MD Initiated Contact with Patient 02/04/12 0115      Chief Complaint  Patient presents with  . Rectal Pain    (Consider location/radiation/quality/duration/timing/severity/associated sxs/prior treatment) HPI This is a 51 year old male with about a 7 week history of spasmodic, knifelike pain in his rectum. It usually follows bowel movements but recently has been occurring at random times. He has been treated with Anusol hydrocortisone suppositories with transient relief but the pain returned yesterday. It has been associated with rectal bleeding. He states the pain is severe at its worst. He has been taking Tylenol for the pain without relief. He denies nausea, vomiting or abdominal pain.  Past Medical History  Diagnosis Date  . Arthritis   . Anxiety   . Depression History of Bipolar    not on meds  . GERD (gastroesophageal reflux disease)   . Substance abuse     9yrs ago smoke crack and marjuana  . Bipolar 1 disorder   . Manic disorder     Past Surgical History  Procedure Date  . Eye surgery     bilateral eye sx as a child 5-71yrs old.    Family History  Problem Relation Age of Onset  . Colon polyps Father   . Diabetes Father   . Diabetes Brother     History  Substance Use Topics  . Smoking status: Current Every Day Smoker -- 1.0 packs/day for 25 years    Types: Cigarettes  . Smokeless tobacco: Never Used  . Alcohol Use: 7.2 oz/week    12 Cans of beer per week      Review of Systems  All other systems reviewed and are negative.    Allergies  Review of patient's allergies indicates no known allergies.  Home Medications   Current Outpatient Rx  Name  Route  Sig  Dispense  Refill  . ASPIRIN 81 MG PO TABS   Oral   Take 81 mg by mouth 2 (two) times daily.          Marland Kitchen DIPHENHYDRAMINE-APAP (SLEEP) 25-500 MG PO TABS   Oral   Take 2 tablets by mouth 3 (three) times daily. As needed  for pain          . HYDROCORTISONE ACETATE 25 MG RE SUPP   Rectal   Place 1 suppository (25 mg total) rectally 2 (two) times daily.   12 suppository   0   . NABUMETONE 500 MG PO TABS   Oral   Take 1 tablet (500 mg total) by mouth 2 (two) times daily. Take with food   30 tablet   1     BP 160/100  Pulse 100  Resp 20  SpO2 100%  Physical Exam General: Well-developed, well-nourished male in no acute distress; appearance consistent with age of record HENT: normocephalic, atraumatic Eyes: pupils equal round and reactive to light; extraocular muscles intact Neck: supple Heart: regular rate and rhythm Lungs: Normal respiratory effort and excursion Abdomen: soft; nondistended Rectal: Tender, bleeding external hemorrhoid; posterior midline tenderness suggestive of a fissure although patient's pain prohibited formal anoscopy Extremities: No deformity; full range of motion; pulses normal Neurologic: Awake, alert and oriented; motor function intact in all extremities and symmetric; no facial droop Skin: Warm and dry Psychiatric: Normal mood and affect; mildly pressured speech    ED Course  Procedures (including critical care time)     MDM  We'll provide  topical anesthetic and refer to surgery.        Hanley Seamen, MD 02/04/12 0130

## 2012-02-08 ENCOUNTER — Telehealth: Payer: Self-pay

## 2012-02-08 NOTE — Telephone Encounter (Signed)
Pt wants a refill on cipro and hydrocortisone suppositories.  Pt states Dr. Alwyn Ren you can call him with any questions.

## 2012-02-08 NOTE — Telephone Encounter (Signed)
DR.HOPPER, PT WOULD LIKE TO DISCUSS HIS MEDICATIONS WITH YOU, HE STATES THAT HE HAS NOT BEEN ABLE TO TAKE THEM ALL TOGETHER BECAUSE HE HAS NOT HAD THE MONEY TO BUY THEM TOGETHER TO TAKE AT THE SAME TIME. HE STATES HE FEELS THAT NOT TAKING THE MEDICATIONS THAT WERE PRESCRIBED AT THE SAME TIME CAUSED THE PROBLEM WITH RECTAL PAIN TO PRES IST. PLEASE ADVISE BEST# [phone]

## 2012-02-08 NOTE — Telephone Encounter (Signed)
Lm with wife to cb

## 2012-02-09 ENCOUNTER — Other Ambulatory Visit: Payer: Self-pay | Admitting: Internal Medicine

## 2012-02-10 ENCOUNTER — Telehealth: Payer: Self-pay

## 2012-02-10 ENCOUNTER — Other Ambulatory Visit: Payer: Self-pay | Admitting: Internal Medicine

## 2012-02-10 NOTE — Telephone Encounter (Signed)
The patient states the pharmacy needs authorization to fill the ciproflox rx.  The patient stated he needs this done asap.  The patient stated he will probably need to "speak to Dr. Alwyn Ren to get anything done".  The patient uses [facility] in [place].  The patient would like call stating that this has been completed at [phone].

## 2012-02-10 NOTE — Telephone Encounter (Signed)
Patient has called back cursing and raising his voice at me over the phone. I have tried to advise him the Rx request was sent to Dr Alwyn Ren. Patient very angry. States he will go [place] to get his medications. I am unsure how else I can help him. I have advised him Rx request sent. Keirra Zeimet

## 2012-02-10 NOTE — Telephone Encounter (Signed)
Patient's old paper chart was reviewed to determine when I had seen him for these problems. I did a physical exam on him a year and a half ago, and so him once since then, and he has seen a couple of other physicians in the electronic medical record. Advised he come in for recheck, that we would not call in the antibiotics. He was upset about this and wanted to speak to me. I called him back and spoke to him. He expressed his irritation at physicians for not being willing to call in antibiotics. I to explain things to the but he kept on talking nonstop even when I was trying to talk to him, so there is very little communication. Ultimately I up on him because I could not communicate. I have tried to explain to him that he needed to come in and get rechecked. He apparently does not have a car and does not have much money.  If he calls back please just advised him to come on in for a reevaluation. If he is completely unhappy with her care here suggested he find a another physician elsewhere that he can be happy with.

## 2012-02-11 ENCOUNTER — Telehealth: Payer: Self-pay | Admitting: Family Medicine

## 2012-02-11 NOTE — Telephone Encounter (Signed)
Opened in error

## 2012-02-13 ENCOUNTER — Other Ambulatory Visit: Payer: Self-pay | Admitting: *Deleted

## 2012-02-19 ENCOUNTER — Telehealth: Payer: Self-pay

## 2012-02-19 NOTE — Telephone Encounter (Signed)
PT IS VERY IRRITATED AND REFUSES TO LISTEN.  - HE WOULD LIKE FOR [place] TO CALL [place] TO FIND OUT WHAT PLACE ACTUALLY CARRIES THE SUPPOSITORIES HE HAS BEEN PRESCRIBED.  SAYS [place] IN [place] AND THE LOCAL DRUG STORES DO NOT CARRY THE SCRIPT.  SAYS HE IS BEING PING-PONGED BACK AND FORTH BETWEEN [place] AND  AND FEELS LIKE A ORPHAN BECAUSE HE CANT GET THE HELP HE NEEDS.  WANTED OUR ADMINISTRATOR TO CALL HIM BACK.  VERY CONFUSING CONVERSATION.  PLEASE CALL [phone]

## 2012-02-20 MED ORDER — HYDROCORTISONE ACETATE 25 MG RE SUPP: 25.0000 mg | Freq: Two times a day (BID) | RECTAL | Status: DC

## 2012-02-20 NOTE — Telephone Encounter (Signed)
Sent to [facility], they should have them. I tried to call him to advise, his voice mail box was full. I left message for the [facility] to call him if they have them in stock. Amy

## 2012-02-21 ENCOUNTER — Other Ambulatory Visit: Payer: Self-pay | Admitting: Family Medicine

## 2012-02-21 ENCOUNTER — Telehealth: Payer: Self-pay

## 2012-02-21 MED ORDER — HYDROCORTISONE ACETATE 25 MG RE SUPP: 25.0000 mg | Freq: Two times a day (BID) | RECTAL | Status: DC | PRN

## 2012-02-21 NOTE — Telephone Encounter (Addendum)
I spoke with patient for >15 minutes - he did not let me talk a lot - and his speech was tangential - still unsure what patient really wants - He asked for cipro and rectal meds - and I told him that I would not refill his cipro - It looks like the suppository were refilled yesterday and I will send them to the correct pharmacy that the patient requested.  I have him options of recheck here with Dr Alwyn Ren or referral to GI - I had to end the conversation by telling him I could not longer be on the phone with him because I had a patient waiting and i had to tell him this several times.  He still did not tell me what he wanted me to do.  Pt was not rude he just would not listen.

## 2012-02-21 NOTE — Telephone Encounter (Signed)
Pts call was transferred to me while i was working in MR. Pt called asking for refill on antibiotic and to have suppositories resent to a different pharmacy. Pt was difficult on the phone. Pt continued to talk over me and cut me off as i was trying to help him. Benny Lennert entered MR to ask for a chart and overheard the phone call. Sarah asked for the phone to speak with pt and spent over 15 minutes on the phone with pt. Maralyn Sago explained reasons why we could not refill antibiotic continuously and told pt she would send suppository to correct pharmacy. Pt continued to interrupt and ask for the same things over again. Maralyn Sago continued to ask how she could help pt at this time and gave him options of a referral or to see Dr Alwyn Ren again. Sarah ended phone call with above options.   bf

## 2012-02-21 NOTE — Addendum Note (Signed)
Addended by: Morrell Riddle on: 02/21/2012 03:34 PM   Modules accepted: Orders

## 2012-02-24 ENCOUNTER — Telehealth: Payer: Self-pay

## 2012-02-24 NOTE — Telephone Encounter (Signed)
THE ANSWERING SERVICE STATES PT CALLED THEM LAST NIGHT AND WANTED A CALL BACK FROM DR HOPPER, HE DIDN'T STATE WHAT IT WAS ABOUT PLEASE CALL [phone]

## 2012-03-03 ENCOUNTER — Emergency Department (HOSPITAL_COMMUNITY)
Admission: EM | Admit: 2012-03-03 | Discharge: 2012-03-03 | Disposition: A | Discharge status: Home / Self Care | Payer: Medicare Other | Attending: Emergency Medicine | Admitting: Emergency Medicine

## 2012-03-03 ENCOUNTER — Encounter (HOSPITAL_COMMUNITY): Payer: Self-pay | Admitting: Emergency Medicine

## 2012-03-03 DIAGNOSIS — Z8659 Personal history of other mental and behavioral disorders: Secondary | ICD-10-CM | POA: Insufficient documentation

## 2012-03-03 DIAGNOSIS — F17200 Nicotine dependence, unspecified, uncomplicated: Secondary | ICD-10-CM | POA: Insufficient documentation

## 2012-03-03 DIAGNOSIS — Z8719 Personal history of other diseases of the digestive system: Secondary | ICD-10-CM | POA: Insufficient documentation

## 2012-03-03 DIAGNOSIS — F1210 Cannabis abuse, uncomplicated: Secondary | ICD-10-CM | POA: Insufficient documentation

## 2012-03-03 DIAGNOSIS — Z7982 Long term (current) use of aspirin: Secondary | ICD-10-CM | POA: Insufficient documentation

## 2012-03-03 DIAGNOSIS — K6289 Other specified diseases of anus and rectum: Secondary | ICD-10-CM

## 2012-03-03 DIAGNOSIS — Z8739 Personal history of other diseases of the musculoskeletal system and connective tissue: Secondary | ICD-10-CM | POA: Insufficient documentation

## 2012-03-03 MED ORDER — LIDOCAINE HCL 2 % EX GEL
Freq: Once | CUTANEOUS | Status: AC
  Administered 2012-03-03: 16:00:00
  Filled 2012-03-03: qty 10

## 2012-03-03 NOTE — ED Notes (Signed)
Pt left prior to signing D/C instructions. Pt stated he couldn't wait any longer and wanted to catch the bus.

## 2012-03-03 NOTE — ED Provider Notes (Signed)
Medical screening examination/treatment/procedure(s) were performed by non-physician practitioner and as supervising physician I was immediately available for consultation/collaboration.  Ethelda Chick, MD 03/03/12 [phone]

## 2012-03-03 NOTE — ED Notes (Signed)
PA Tran at bedside. 

## 2012-03-03 NOTE — ED Notes (Signed)
Pt reports h/o hemorrhoids, states the medicine we gave him at his last visit helps around the outside but not on the inside.  Pt denies rectal bleeding.

## 2012-03-03 NOTE — ED Provider Notes (Signed)
History     CSN: [id number]  Arrival date & time 03/03/12  1320   First MD Initiated Contact with Patient 03/03/12 1519      Chief Complaint  Patient presents with  . Hemorrhoids    (Consider location/radiation/quality/duration/timing/severity/associated sxs/prior treatment) HPI  51 year old male with hx of hemorroids presents for further evaluation of his rectal pain.  Pt reports he was seen and was diagnosed with hemorrhoids in the ER last month for his rectal pain.  Was given Anusol suppository which has help on the outside, however no improvement inside of rectum.  Report pain has been worsen in the past 3 days.  Denies rectal bleeding.  Denies abdominal pain, fever, chills, rash.  Denies straining when using the bathroom.    Past Medical History  Diagnosis Date  . Arthritis   . Anxiety   . Depression History of Bipolar    not on meds  . GERD (gastroesophageal reflux disease)   . Substance abuse     90yrs ago smoke crack and marjuana  . Bipolar 1 disorder   . Manic disorder     Past Surgical History  Procedure Date  . Eye surgery     bilateral eye sx as a child 5-89yrs old.    Family History  Problem Relation Age of Onset  . Colon polyps Father   . Diabetes Father   . Diabetes Brother     History  Substance Use Topics  . Smoking status: Current Every Day Smoker -- 1.0 packs/day for 25 years    Types: Cigarettes  . Smokeless tobacco: Never Used  . Alcohol Use: 7.2 oz/week    12 Cans of beer per week      Review of Systems  Constitutional: Negative for fever.  Gastrointestinal: Positive for rectal pain. Negative for abdominal pain, diarrhea, constipation and blood in stool.  Musculoskeletal: Negative for back pain.  Skin: Negative for rash.  Neurological: Negative for numbness.    Allergies  Review of patient's allergies indicates no known allergies.  Home Medications   Current Outpatient Rx  Name  Route  Sig  Dispense  Refill  . ASPIRIN 81 MG  PO TABS   Oral   Take 81 mg by mouth 2 (two) times daily.          Marland Kitchen DIPHENHYDRAMINE-APAP (SLEEP) 25-500 MG PO TABS   Oral   Take 2 tablets by mouth 3 (three) times daily. As needed for pain         . HYDROCORTISONE ACETATE 25 MG RE SUPP   Rectal   Place 1 suppository (25 mg total) rectally 2 (two) times daily as needed for hemorrhoids.   6 suppository   0   . LIDOCAINE (ANORECTAL) 5 % EX CREA      Apply to rectal area as needed for pain.   45 g   1     BP 156/100  Pulse 88  Temp 97.8 F (36.6 C) (Oral)  Resp 18  SpO2 100%  Physical Exam  Nursing note and vitals reviewed. Constitutional: He appears well-developed and well-nourished. He appears distressed (agitated, talking to himself, however nontoxic).  HENT:  Head: Atraumatic.  Mouth/Throat: Oropharynx is clear and moist.  Eyes: Conjunctivae normal are normal.  Neck: Neck supple.  Abdominal: Soft. There is no tenderness.  Genitourinary:       Rectal exam with normal rectal tone, external hemorrhoid noted. Questionable anal fissure.  Moderate tenderness on exam.  No mass, prostate tenderness, or  frank blood.    Examination is limited due to patient's uncooperation  Neurological: He is alert.  Skin: Skin is warm. No rash noted.    ED Course  Procedures (including critical care time)  Labs Reviewed - No data to display No results found.   No diagnosis found.  1. Rectal pain  MDM  Pt with hemorrhoid and rectal pain.  Questionable anal fissure.  Pt has been seen several times for same complaint.  Sts the only thing that works is lido lubricant that was given at one of his visit.  He demands the same treatment.  Will apply lido-lube and will refer pt to general surgery for further care.  Pt reports he still have anusol and Preparation H and does not want a refill.    3:50 PM I have apply lido-lube to rectum without difficulty.  Pt tolerates well. Pt agrees to f/u with [facility] for further  care.    BP 156/100  Pulse 88  Temp 97.8 F (36.6 C) (Oral)  Resp 18  SpO2 100%  I have reviewed nursing notes and vital signs. I personally reviewed the imaging tests through PACS system  I reviewed available ER/hospitalization records thought the EMR     Fayrene Helper, PA-C 03/03/12 1551

## 2012-03-17 ENCOUNTER — Ambulatory Visit (INDEPENDENT_AMBULATORY_CARE_PROVIDER_SITE_OTHER): Payer: Self-pay | Admitting: General Surgery

## 2012-03-26 ENCOUNTER — Ambulatory Visit (INDEPENDENT_AMBULATORY_CARE_PROVIDER_SITE_OTHER): Payer: Self-pay | Admitting: General Surgery

## 2012-05-13 ENCOUNTER — Ambulatory Visit (INDEPENDENT_AMBULATORY_CARE_PROVIDER_SITE_OTHER): Payer: Medicare Other | Admitting: General Surgery

## 2012-05-13 ENCOUNTER — Encounter (INDEPENDENT_AMBULATORY_CARE_PROVIDER_SITE_OTHER): Payer: Self-pay | Admitting: Surgery

## 2012-05-13 ENCOUNTER — Ambulatory Visit (INDEPENDENT_AMBULATORY_CARE_PROVIDER_SITE_OTHER): Payer: Medicare Other | Admitting: Surgery

## 2012-05-13 VITALS — BP 170/88 | HR 73 | Temp 97.2°F | Resp 18 | Ht 70.0 in | Wt 160.0 lb

## 2012-05-13 DIAGNOSIS — Z720 Tobacco use: Secondary | ICD-10-CM

## 2012-05-13 DIAGNOSIS — D126 Benign neoplasm of colon, unspecified: Secondary | ICD-10-CM

## 2012-05-13 DIAGNOSIS — F6089 Other specific personality disorders: Secondary | ICD-10-CM

## 2012-05-13 DIAGNOSIS — F17200 Nicotine dependence, unspecified, uncomplicated: Secondary | ICD-10-CM

## 2012-05-13 DIAGNOSIS — K602 Anal fissure, unspecified: Secondary | ICD-10-CM

## 2012-05-13 HISTORY — DX: Tobacco use: Z72.0

## 2012-05-13 MED ORDER — AMBULATORY NON FORMULARY MEDICATION: 1.0000 "application " | Freq: Four times a day (QID) | Status: DC

## 2012-05-13 NOTE — Progress Notes (Signed)
Subjective:     Patient ID: Austin Cobb, male   DOB: Jul 21, 1960, 52 y.o.   MRN: [id number]  HPI  Austin Cobb  [birth year] [id number]  Patient Care Team: Peyton Najjar, MD as PCP - General (Family Medicine) Louis Meckel, MD as Consulting Physician (Gastroenterology)  This patient is a 52 y.o.male who presents today for surgical evaluation at the request of Dr. Alwyn Ren.   Reason for visit: Anal pain  Smoking male with rectal "situation" for the past six months.  He had a flare of anal pain in the mid 1990s it went away after two days.  He notes that this pain started coming back up in October 2013.  He notes that he gets sharp pains with bowel movements.  We will fill some spasming.  He wonders if his prostate is infected as well.  No severe drainage.  Went to the emergency room around Thanksgiving 2013 and then around Christmas Eve.  Workup did not reveal anything scary, however, it was recommended to him he followup with Gen. Surgery for treatment of probable hemorrhoids vs. Fissure.    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  He has been using Preparation H rather regularly.  Switched to Aquaphor or Vaseline.  Has had numerous Anusol prescriptions in the past.  He has been badgering his primary care physician for refills but not wanting to come in.  Finally, he was able to come in and be seen by [place].  Denies much in the way of bleeding.  Sharp pains after he was helping move last month.  Also with defecation.  He has a history of serrated adenoma seen on colon screening colonoscopy two years ago.  Due for followup colonoscopy in 2015.  No personal nor family history of GI/colon cancer, inflammatory bowel disease, irritable bowel syndrome, allergy such as Celiac Sprue, dietary/dairy problems, colitis, ulcers nor gastritis.  No recent sick contacts/gastroenteritis.  No travel outside the country.  No changes in diet.    Patient  Active Problem List  Diagnosis  . Chronic shoulder pain  . Neck pain, chronic  . Mood disorder  . Depression with anxiety  . Serrated adenoma of colon  . Anal fissure  . Tobacco abuse    Past Medical History  Diagnosis Date  . Arthritis   . Anxiety   . Depression History of Bipolar    not on meds  . GERD (gastroesophageal reflux disease)   . Substance abuse     43yrs ago smoke crack and marjuana  . Bipolar 1 disorder   . Manic disorder   . Tobacco abuse 05/13/2012    Past Surgical History  Procedure Laterality Date  . Eye surgery      bilateral eye sx as a child [age]yrs old.    History   Social History  . Marital Status: Divorced    Spouse Name: N/A    Number of Children: N/A  . Years of Education: N/A   Occupational History  . Not on file.   Social History Main Topics  . Smoking status: Current Every Day Smoker -- 1.00 packs/day for 25 years    Types: Cigarettes  . Smokeless tobacco: Never Used  . Alcohol Use: 7.2 oz/week    12 Cans of beer per week  . Drug Use: No  . Sexually Active: No   Other Topics Concern  . Not on file   Social History Narrative  . No  narrative on file    Family History  Problem Relation Age of Onset  . Colon polyps Father   . Diabetes Father   . Diabetes Brother     Current Outpatient Prescriptions  Medication Sig Dispense Refill  . aspirin 81 MG tablet Take 81 mg by mouth 2 (two) times daily.       . diphenhydramine-acetaminophen (TYLENOL PM) 25-500 MG TABS Take 2 tablets by mouth 3 (three) times daily. As needed for pain      . hydrocortisone (ANUSOL-HC) 25 MG suppository Place 1 suppository (25 mg total) rectally 2 (two) times daily as needed for hemorrhoids.  6 suppository  0  . Lidocaine, Anorectal, 5 % CREA Apply to rectal area as needed for pain.  45 g  1  . AMBULATORY NON FORMULARY MEDICATION Place 1 application rectally 4 (four) times daily. Diltiazem 2% compounded suspension.  1 Tube  2   No current  facility-administered medications for this visit.     No Known Allergies  BP 170/88  Pulse 73  Temp(Src) 97.2 F (36.2 C) (Temporal)  Resp 18  Ht 5\' 10"  (1.778 m)  Wt 160 lb (72.576 kg)  BMI 22.96 kg/m2  No results found.   Review of Systems  Constitutional: Negative for fever, chills and diaphoresis.  HENT: Negative for nosebleeds, sore throat, facial swelling, mouth sores, trouble swallowing and ear discharge.   Eyes: Negative for photophobia, discharge and visual disturbance.  Respiratory: Negative for choking, chest tightness, shortness of breath and stridor.   Cardiovascular: Negative for chest pain and palpitations.  Gastrointestinal: Positive for anal bleeding and rectal pain. Negative for nausea, vomiting, abdominal pain, diarrhea, constipation, blood in stool and abdominal distention.  Genitourinary: Negative for dysuria, urgency, difficulty urinating and testicular pain.  Musculoskeletal: Negative for myalgias, back pain, arthralgias and gait problem.  Skin: Negative for color change, pallor, rash and wound.  Neurological: Negative for dizziness, speech difficulty, weakness, numbness and headaches.  Hematological: Negative for adenopathy. Does not bruise/bleed easily.  Psychiatric/Behavioral: Positive for behavioral problems. Negative for hallucinations, confusion, dysphoric mood, decreased concentration and agitation. The patient is hyperactive.        Objective:   Physical Exam  Constitutional: He is oriented to person, place, and time. He appears well-developed and well-nourished. No distress.  HENT:  Head: Normocephalic.  Mouth/Throat: Oropharynx is clear and moist. No oropharyngeal exudate.  Eyes: Conjunctivae and EOM are normal. Pupils are equal, round, and reactive to light. No scleral icterus.  Neck: Normal range of motion. Neck supple. No tracheal deviation present.  Cardiovascular: Normal rate, regular rhythm and intact distal pulses.   Pulmonary/Chest:  Effort normal and breath sounds normal. No respiratory distress.  Abdominal: Soft. He exhibits no distension. There is no tenderness. Hernia confirmed negative in the right inguinal area and confirmed negative in the left inguinal area.  Genitourinary:  Exam done with assistance of male Medical Assistant in the room.  Perianal skin clean with fair hygiene.  No pruritis.  No external skin tags / hemorrhoids of significance.  No pilonidal disease.  No abscess/fistula.     Obvious posterior midline fissure.  Very sensitive.  Increased sphincter tone.  Did not attempt digital or anoscopic rectal examination due to severe sensitivity and spasm.   Musculoskeletal: Normal range of motion. He exhibits no tenderness.  Lymphadenopathy:    He has no cervical adenopathy.       Right: No inguinal adenopathy present.       Left: No inguinal adenopathy  present.  Neurological: He is alert and oriented to person, place, and time. No cranial nerve deficit. He exhibits normal muscle tone. Coordination normal.  Skin: Skin is warm and dry. No rash noted. He is not diaphoretic. No erythema. No pallor.  Psychiatric: He has a normal mood and affect. His behavior is normal. Judgment and thought content normal. His speech is rapid and/or pressured. Cognition and memory are normal.  Very talkative.  Bordering on mania.  Talking to himself in the room by himself area.  Not hostile.  Pleasant.  Very detailed memory of dates/distant events.       Assessment:     History and physical very suspicious for anal fissure.  Chronic tobacco abuse and smoking.  Hypomania with history of verbal combativeness with prior physicians.  No strong evidence of agitation today.     Plan:     Trial of muscle relaxant diltiazem.  Surgery backup plan:  The anatomy & physiology of the anorectal region was discussed.  The pathophysiology of anal fissure and differential diagnosis was discussed.  Natural history progression  was  discussed.   I stressed the importance of a bowel regimen to have daily soft bowel movements to minimize progression of disease.   I discussed the use of warm soaks &  muscle relaxant, diltiazem, to help the anal sphincter relax, allow the spasming to stop, and help the tear/fissure to heal.  If non-operative treatment does not heal the fissure, I would recommend examination under anesthesia for better examination to confirm the diagnosis and treat by lateral internal sphincterotomy to allow the fissure to heal.  Technique, benefits, alternatives discussed.  Risks such as bleeding, pain, incontinence, recurrence, heart attack, death, and other risks were discussed.    Educational handouts further explaining the pathology, treatment options, and bowel regimen were given as well.  The patient expressed understanding & will follow up PRN.  If the pain does not resolve in a few weeks or worsens, he should proceed with surgery.  We talked to the patient about the dangers of smoking.  We stressed that tobacco use dramatically increases the risk of peri-operative complications such as infection, tissue necrosis leaving to problems with incision/wound and organ healing, heart attack, stroke, DVT, pulmonary embolism, and death.  We noted there are programs in our community to help stop smoking.  Definitely keep followup colonoscopy in 2015 given the Four serrated adenomas seen by Dr. Arlyce Dice.  Consider psychiatric evaluation to help stabilize his hypomania.  At the end of the visit he was quite happy with the recommendations and was expressing willingness to go with them.

## 2012-05-13 NOTE — Patient Instructions (Signed)
Anal Fissure, Adult An anal fissure is a small tear or crack in the skin around the anus. Bleeding from a fissure usually stops on its own within a few minutes. However, bleeding will often reoccur with each bowel movement until the crack heals.  CAUSES   Passing large, hard stools.  Frequent diarrheal stools.  Constipation.  Inflammatory bowel disease (Crohn's disease or ulcerative colitis).  Infections.  Anal sex. SYMPTOMS   Small amounts of blood seen on your stools, on toilet paper, or in the toilet after a bowel movement.  Rectal bleeding.  Painful bowel movements.  Itching or irritation around the anus. DIAGNOSIS Your caregiver will examine the anal area. An anal fissure can usually be seen with careful inspection. A rectal exam may be performed and a short tube (anoscope) may be used to examine the anal canal. TREATMENT   You may be instructed to take fiber supplements. These supplements can soften your stool to help make bowel movements easier.  Sitz baths may be recommended to help heal the tear. Do not use soap in the sitz baths.  A medicated cream or ointment may be prescribed to lessen discomfort. HOME CARE INSTRUCTIONS   Maintain a diet high in fruits, whole grains, and vegetables. Avoid constipating foods like bananas and dairy products.  Take sitz baths as directed by your caregiver.  Drink enough fluids to keep your urine clear or pale yellow.  Only take over-the-counter or prescription medicines for pain, discomfort, or fever as directed by your caregiver. Do not take aspirin as this may increase bleeding.  Do not use ointments containing numbing medications (anesthetics) or hydrocortisone. They could slow healing. SEEK MEDICAL CARE IF:   Your fissure is not completely healed within 3 days.  You have further bleeding.  You have a fever.  You have diarrhea mixed with blood.  You have pain.  Your problem is getting worse rather than  better. MAKE SURE YOU:   Understand these instructions.  Will watch your condition.  Will get help right away if you are not doing well or get worse. Document Released: 02/25/2005 Document Revised: 05/20/2011 Document Reviewed: 08/12/2010 [facility] Patient Information 2013 [place], [place].  HEMORRHOIDS  The rectum is the last foot of your colon, and it naturally stretches to hold stool.  Hemorrhoidal piles are natural clusters of blood vessels that help the rectum and anal canal stretch to hold stool and allow bowel movements to eliminate feces.   Hemorrhoids are abnormally swollen blood vessels in the rectum.  Too much pressure in the rectum causes hemorrhoids by forcing blood to stretch and bulge the walls of the veins, sometimes even rupturing them.  Hemorrhoids can become like varicose veins you might see on a person's legs.  Most people will develop a flare of hemorrhoids in their lifetime.  When bulging hemorrhoidal veins are irritated, they can swell, burn, itch, cause pain, and bleed.  Most flares will calm down gradually own within a few weeks.  However, once hemorrhoids are created, they are difficult to get rid of completely and tend to flare more easily than the first flare.   Fortunately, good habits and simple medical treatment usually control hemorrhoids well, and surgery is needed only in severe cases. Types of Hemorrhoids:  Internal hemorrhoids usually don't initially hurt or itch; they are deep inside the rectum and usually have no sensation. If they begin to push out (prolapse), pain and burning can occur.  However, internal hemorrhoids can bleed.  Anal bleeding should not  be ignored since bleeding could come from a dangerous source like colorectal cancer, so persistent rectal bleeding should be investigated by a doctor, sometimes with a colonoscopy.  External hemorrhoids cause most of the symptoms - pain, burning, and itching. Nonirritated hemorrhoids can look like small skin tags  coming out of the anus.   Thrombosed hemorrhoids can form when a hemorrhoid blood vessel bursts and causes the hemorrhoid to suddenly swell.  A purple blood clot can form in it and become an excruciatingly painful lump at the anus. Because of these unpleasant symptoms, immediate incision and drainage by a surgeon at an office visit can provide much relief of the pain.    PREVENTION Avoiding the most frequent causes listed below will prevent most cases of hemorrhoids: Constipation Hard stools Diarrhea  Constant sitting  Straining with bowel movements Sitting on the toilet for a long time  Severe coughing  episodes Pregnancy / Childbirth  Heavy Lifting  Sometimes avoiding the above triggers is difficult:  How can you avoid sitting all day if you have a seated job? Also, we try to avoid coughing and diarrhea, but sometimes it's beyond your control.  Still, there are some practical hints to help: Keep the anal and genital area clean.  Moistened tissues such as flushable wet wipes are less irritating than toilet paper.  Using irrigating showers or bottle irrigation washing gently cleans this sensitive area.   Avoid dry toilet paper when cleaning after bowel movements.  Marland Kitchen Keep the anal and genital area dry.  Lightly pat the rectal area dry.  Avoid rubbing.  Talcum or baby powders can help GET YOUR STOOLS SOFT.   This is the most important way to prevent irritated hemorrhoids.  Hard stools are like sandpaper to the anorectal canal and will cause more problems.  The goal: ONE SOFT BOWEL MOVEMENT A DAY!  BMs from every other day to 3 times a day is a tolerable range Treat coughing, diarrhea and constipation early since irritated hemorrhoids may soon follow.  If your main job activity is seated, always stand or walk during your breaks. Make it a point to stand and walk at least 5 minutes every hour and try to shift frequently in your chair to avoid direct rectal pressure.  Always exhale as you strain or  lift. Don't hold your breath.  Do not delay or try to prevent a bowel movement when the urge is present. Exercise regularly (walking or jogging 60 minutes a day) to stimulate the bowels to move. No reading or other activity while on the toilet. If bowel movements take longer than 5 minutes, you are too constipated. AVOID CONSTIPATION Drink plenty of liquids (1 1/2 to 2 quarts of water and other fluids a day unless fluid restricted for another medical condition). Liquids that contain caffeine (coffee a, tea, soft drinks) can be dehydrating and should be avoided until constipation is controlled. Consider minimizing milk, as dairy products may be constipating. Eat plenty of fiber (30g a day ideal, more if needed).  Fiber is the undigested part of plant food that passes into the colon, acting as "natures broom" to encourage bowel motility and movement.  Fiber can absorb and hold large amounts of water. This results in a larger, bulkier stool, which is soft and easier to pass.  Eating foods high in fiber - 12 servings - such as  Vegetables: Root (potatoes, carrots, turnips), Leafy green (lettuce, salad greens, celery, spinach), High residue (cabbage, broccoli, etc.) Fruit: Fresh, Dried (prunes, apricots,  cherries), Stewed (applesauce)  Whole grain breads, pasta, whole wheat Bran cereals, muffins, etc. Consider adding supplemental bulking fiber which retains large volumes of water: Psyllium ground seeds --available as Metamucil, Konsyl, Effersyllium, Per Diem Fiber, or the less expensive generic forms.  Citrucel  (methylcellulose wood fiber) . FiberCon (Polycarbophil) Polyethylene Glycol - and "artificial" fiber commonly called Miralax or Glycolax.  It is helpful for people with gassy or bloated feelings with regular fiber Flax Seed - a less gassy natural fiber  Laxatives can be useful for a short period if constipation is severe Osmotics (Milk of Magnesia, Fleets Phospho-Soda, Magnesium Citrate)   Stimulants (Senokot,   Castor Oil,  Dulcolax, Ex-Lax)    Laxatives are not a good long-term solution as it can stress the bowels and cause too much mineral loss and dehydration.   Avoid taking laxatives for more than 7 days in a row.  AVOID DIARRHEA Switch to liquids and simpler foods for a few days to avoid stressing your intestines further. Avoid dairy products (especially milk & ice cream) for a short time.  The intestines often can lose the ability to digest lactose when stressed. Avoid foods that cause gassiness or bloating.  Typical foods include beans and other legumes, cabbage, broccoli, and dairy foods.  Every person has some sensitivity to other foods, so listen to your body and avoid those foods that trigger problems for you. Adding fiber (Citrucel, Metamucil, FiberCon, Flax seed, Miralax) gradually can help thicken stools by absorbing excess fluid and retrain the intestines to act more normally.  Slowly increase the dose over a few weeks.  Too much fiber too soon can backfire and cause cramping & bloating. Probiotics (such as active yogurt, Align, etc) may help repopulate the intestines and colon with normal bacteria and calm down a sensitive digestive tract.  Most studies show it to be of mild help, though, and such products can be costly. Medicines: Bismuth subsalicylate (ex. Kayopectate, Pepto Bismol) every 30 minutes for up to 6 doses can help control diarrhea.  Avoid if pregnant. Loperamide (Immodium) can slow down diarrhea.  Start with two tablets (4mg  total) first and then try one tablet every 6 hours.  Avoid if you are having fevers or severe pain.  If you are not better or start feeling worse, stop all medicines and call your doctor for advice Call your doctor if you are getting worse or not better.  Sometimes further testing (cultures, endoscopy, X-ray studies, bloodwork, etc) may be needed to help diagnose and treat the cause of the diarrhea. TREATMENT OF HEMORRHOID FLARE If  these preventive measures fail, you must take action right away! Hemorrhoids are one condition that can be mild in the morning and become intolerable by nightfall. Most hemorrhoidal flares take several weeks to calm down.  These suggestions can help: Warm soaks.  This helps more than any topical medication.  Use up to 8 times a day.  Usually sitz baths or sitting in a warm bathtub helps.  Sitting on moist warm towels are helpful.  Switching to ice packs/cool compresses can be helpful Normalize your bowels.  Extremes of diarrhea or constipation will make hemorrhoids worse.  One soft bowel movement a day is the goal.  Fiber can help get your bowels regular Wet wipes instead of toilet paper Pain control with a NSAID such as ibuprofen (Advil) or naproxen (Aleve) or acetaminophen (Tylenol) around the clock.  Narcotics are constipating and should be minimized if possible Topical creams contain steroids (bydrocortisone) or local  anesthetic (xylocaine) can help make pain and itching more tolerable.   EVALUATION If hemorrhoids are still causing problems, you could benefit by an evaluation by a surgeon.  The surgeon will obtain a history and examine you.  If hemorrhoids are diagnosed, some therapies can be offered in the office, usually with an anoscope into the less sensitive area of the rectum: -injection of hemorrhoids (sclerotherapy) can scar the blood vessels of the swollen/enlarged hemorrhoids to help shrink them down to a more normal size -rubber banding of the enlarged hemorrhoids to help shrink them down to a more normal size -drainage of the blood clot causing a thrombosed hemorrhoid,  to relieve the severe pain   While 90% of the time such problems from hemorrhoids can be managed without preceding to surgery, sometimes the hemorrhoids require a operation to control the problem (uncontrolled bleeding, prolapse, pain, etc.).   This involves being placed under general anesthesia where the surgeon can  confirm the diagnosis and remove, suture, or staple the hemorrhoid(s).  Your surgeon can help you treat the problem appropriately.

## 2012-10-08 ENCOUNTER — Encounter (INDEPENDENT_AMBULATORY_CARE_PROVIDER_SITE_OTHER): Payer: Self-pay | Admitting: Surgery

## 2012-10-08 ENCOUNTER — Ambulatory Visit (INDEPENDENT_AMBULATORY_CARE_PROVIDER_SITE_OTHER): Payer: Medicare Other | Admitting: Surgery

## 2012-10-08 VITALS — BP 118/78 | HR 76 | Resp 18 | Ht 69.0 in | Wt 152.0 lb

## 2012-10-08 DIAGNOSIS — K602 Anal fissure, unspecified: Secondary | ICD-10-CM

## 2012-10-08 DIAGNOSIS — Z720 Tobacco use: Secondary | ICD-10-CM

## 2012-10-08 DIAGNOSIS — F6089 Other specific personality disorders: Secondary | ICD-10-CM

## 2012-10-08 MED ORDER — AMBULATORY NON FORMULARY MEDICATION: 1.0000 "application " | Freq: Four times a day (QID) | Status: DC

## 2012-10-08 NOTE — Progress Notes (Signed)
Subjective:     Patient ID: Austin Cobb, male   DOB: 12-30-1960, 52 y.o.   MRN: [id number]  HPI   Austin Cobb  1960-06-28 [id number]  Patient Care Team: Peyton Najjar, MD as PCP - General (Family Medicine) Louis Meckel, MD as Consulting Physician (Gastroenterology)  This patient is a 52 y.o.male who presents today for surgical evaluation at the request of Dr. Alwyn Ren.   Reason for visit: Persitent anal pain  Smoking male with anal fissure diagnosed by me in March 2014.  He tried many over-the-counter treatments including Aquaphor and steroid creams.  It made it more tolerable but not healing.  I recommended a prescription of diltiazem and if that did not heal, surgical sphincterotomy.  I also recommended he stop smoking.  Returns with similar complaints.  He did not fill the prescription for diltiazem as the $28 copay was not affordable to him at the time.  He tells me is in a much better financial situation.  Please see my note from March for detailed past history/story on this.  He has a history of serrated adenoma seen on colon screening colonoscopy two years ago.  Due for followup colonoscopy in 2015.  No personal nor family history of GI/colon cancer, inflammatory bowel disease, irritable bowel syndrome, allergy such as Celiac Sprue, dietary/dairy problems, colitis, ulcers nor gastritis.  No recent sick contacts/gastroenteritis.  No travel outside the country.  No changes in diet.    Patient Active Problem List   Diagnosis Date Noted  . Serrated adenoma of colon 05/13/2012  . Anal fissure 05/13/2012  . Tobacco abuse 05/13/2012  . 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 Chronic shoulder pain 01/03/2012  . Neck pain, chronic 01/03/2012  . Mood disorder 01/03/2012  . Depression with anxiety 01/03/2012    Past Medical History  Diagnosis Date  . Arthritis   . Anxiety   . Depression History of Bipolar    not on meds  . GERD (gastroesophageal reflux disease)   . Substance  abuse     95yrs ago smoke crack and marjuana  . Bipolar 1 disorder   . Manic disorder   . Tobacco abuse 05/13/2012    Past Surgical History  Procedure Laterality Date  . Eye surgery      bilateral eye sx as a child 6-28yrs old.    History   Social History  . Marital Status: Divorced    Spouse Name: N/A    Number of Children: N/A  . Years of Education: N/A   Occupational History  . Not on file.   Social History Main Topics  . Smoking status: Current Every Day Smoker -- 1.00 packs/day for 25 years    Types: Cigarettes  . Smokeless tobacco: Never Used  . Alcohol Use: 7.2 oz/week    12 Cans of beer per week  . Drug Use: No  . Sexually Active: No   Other Topics Concern  . Not on file   Social History Narrative  . No narrative on file    Family History  Problem Relation Age of Onset  . Colon polyps Father   . Diabetes Father   . Diabetes Brother     Current Outpatient Prescriptions  Medication Sig Dispense Refill  . AMBULATORY NON FORMULARY MEDICATION Place 1 application rectally 4 (four) times daily. Diltiazem 2% compounded suspension.  1 Tube  2  . aspirin 81 MG tablet Take 81 mg by mouth 2 (two) times daily.       . diphenhydramine-acetaminophen (  TYLENOL PM) 25-500 MG TABS Take 2 tablets by mouth 3 (three) times daily. As needed for pain      . hydrocortisone (ANUSOL-HC) 25 MG suppository Place 1 suppository (25 mg total) rectally 2 (two) times daily as needed for hemorrhoids.  6 suppository  0  . Lidocaine, Anorectal, 5 % CREA Apply to rectal area as needed for pain.  45 g  1   No current facility-administered medications for this visit.     No Known Allergies  BP 118/78  Pulse 76  Resp 18  Ht 5\' 9"  (1.753 m)  Wt 152 lb (68.947 kg)  BMI 22.44 kg/m2  No results found.   Review of Systems  Constitutional: Negative for fever, chills and diaphoresis.  HENT: Negative for nosebleeds, sore throat, facial swelling, mouth sores, trouble swallowing and ear  discharge.   Eyes: Negative for photophobia, discharge and visual disturbance.  Respiratory: Negative for choking, chest tightness, shortness of breath and stridor.   Cardiovascular: Negative for chest pain and palpitations.  Gastrointestinal: Positive for anal bleeding and rectal pain. Negative for nausea, vomiting, abdominal pain, diarrhea, constipation, blood in stool and abdominal distention.  Genitourinary: Negative for dysuria, urgency, difficulty urinating and testicular pain.  Musculoskeletal: Negative for myalgias, back pain, arthralgias and gait problem.  Skin: Negative for color change, pallor, rash and wound.  Neurological: Negative for dizziness, speech difficulty, weakness, numbness and headaches.  Hematological: Negative for adenopathy. Does not bruise/bleed easily.  Psychiatric/Behavioral: Positive for behavioral problems. Negative for hallucinations, confusion, dysphoric mood, decreased concentration and agitation. The patient is hyperactive.        Objective:   Physical Exam  Constitutional: He is oriented to person, place, and time. He appears well-developed and well-nourished. No distress.  HENT:  Head: Normocephalic.  Mouth/Throat: Oropharynx is clear and moist. No oropharyngeal exudate.  Eyes: Conjunctivae and EOM are normal. Pupils are equal, round, and reactive to light. No scleral icterus.  Neck: Normal range of motion. Neck supple. No tracheal deviation present.  Cardiovascular: Normal rate, regular rhythm and intact distal pulses.   Pulmonary/Chest: Effort normal and breath sounds normal. No respiratory distress.  Abdominal: Soft. He exhibits no distension. There is no tenderness. Hernia confirmed negative in the right inguinal area and confirmed negative in the left inguinal area.  Genitourinary:  Exam done with assistance of male Medical Assistant in the room.  Perianal skin clean with fair hygiene.  No pruritis.  No external skin tags / hemorrhoids of  significance.  No pilonidal disease.  No abscess/fistula.     Obvious posterior midline fissure.  Sensitive.  Increased sphincter tone.  Again did not attempt digital or anoscopic rectal examination due to severe sensitivity and spasm.   Musculoskeletal: Normal range of motion. He exhibits no tenderness.  Lymphadenopathy:    He has no cervical adenopathy.       Right: No inguinal adenopathy present.       Left: No inguinal adenopathy present.  Neurological: He is alert and oriented to person, place, and time. No cranial nerve deficit. He exhibits normal muscle tone. Coordination normal.  Skin: Skin is warm and dry. No rash noted. He is not diaphoretic. No erythema. No pallor.  Psychiatric: He has a normal mood and affect. Thought content normal. His affect is not angry and not labile. His speech is rapid and/or pressured and tangential. His speech is not slurred. He is hyperactive. He is not aggressive. Thought content is not paranoid. Cognition and memory are normal. Cognition and  memory are not impaired. He does not exhibit a depressed mood. He expresses no homicidal ideation.  Very talkative.  Constantly interrupts c/w mania.  Talking to himself in the room by himself again & then Staff in hallways  Not hostile but pleasant.         Assessment:     History and physical very suspicious for anal fissure.  Chronic tobacco abuse and smoking.  Hypomania with history of verbal combativeness with prior physicians.  No strong evidence of agitation today.     Plan:     Again explained the pathophysiology of fissures.  I again recommended a trial of muscle relaxant diltiazem.  He promises me he will fill at this time.  With his smoking history, I am concerned that it may not work.  However, I Noted there are risks to surgery so I was like to try nonoperative options first which is standard.  Surgery as backup plan:  The anatomy & physiology of the anorectal region was discussed.  The  pathophysiology of anal fissure and differential diagnosis was discussed.  Natural history progression  was discussed.   I stressed the importance of a bowel regimen to have daily soft bowel movements to minimize progression of disease.   I discussed the use of warm soaks &  muscle relaxant, diltiazem, to help the anal sphincter relax, allow the spasming to stop, and help the tear/fissure to heal.  If non-operative treatment does not heal the fissure, I would recommend examination under anesthesia for better examination to confirm the diagnosis and treat by lateral internal sphincterotomy to allow the fissure to heal.  Technique, benefits, alternatives discussed.  Risks such as bleeding, pain, incontinence, recurrence, heart attack, death, and other risks were discussed.    Educational handouts further explaining the pathology, treatment options, and bowel regimen were given as well.  The patient expressed understanding.  If the pain does not resolve in a few weeks or worsens, he should proceed with surgery.  We talked to the patient about the dangers of smoking.  We stressed that tobacco use dramatically increases the risk of peri-operative complications such as infection, tissue necrosis leaving to problems with incision/wound and organ healing, heart attack, stroke, DVT, pulmonary embolism, and death.  We noted there are programs in our community to help stop smoking.  Definitely keep followup colonoscopy in 2015 given the h/o 4 serrated adenomas seen by Dr. Arlyce Dice.  Consider psychiatric evaluation to help stabilize his hypomania.  At the end of the visit he was quite happy with the recommendations and was expressing willingness to go with them.  However, he said this the last time as well.  We will see.Marland KitchenMarland Kitchen

## 2012-10-08 NOTE — Patient Instructions (Addendum)
Use a muscle relaxant diltiazem cream to allow the fissure to heal.  If not healed by three weeks and still feeling pain, you will require surgery to treat this as the handout shows  Anal Fissure, Adult An anal fissure is a small tear or crack in the skin around the anus. Bleeding from a fissure usually stops on its own within a few minutes. However, bleeding will often reoccur with each bowel movement until the crack heals.  CAUSES   Passing large, hard stools.  Frequent diarrheal stools.  Constipation.  Inflammatory bowel disease (Crohn's disease or ulcerative colitis).  Infections.  Anal sex. SYMPTOMS   Small amounts of blood seen on your stools, on toilet paper, or in the toilet after a bowel movement.  Rectal bleeding.  Painful bowel movements.  Itching or irritation around the anus. DIAGNOSIS Your caregiver will examine the anal area. An anal fissure can usually be seen with careful inspection. A rectal exam may be performed and a short tube (anoscope) may be used to examine the anal canal. TREATMENT   You may be instructed to take fiber supplements. These supplements can soften your stool to help make bowel movements easier.  Sitz baths may be recommended to help heal the tear. Do not use soap in the sitz baths.  A medicated cream or ointment may be prescribed to lessen discomfort. HOME CARE INSTRUCTIONS   Maintain a diet high in fruits, whole grains, and vegetables. Avoid constipating foods like bananas and dairy products.  Take sitz baths as directed by your caregiver.  Drink enough fluids to keep your urine clear or pale yellow.  Only take over-the-counter or prescription medicines for pain, discomfort, or fever as directed by your caregiver. Do not take aspirin as this may increase bleeding.  Do not use ointments containing numbing medications (anesthetics) or hydrocortisone. They could slow healing. SEEK MEDICAL CARE IF:   Your fissure is not completely  healed within 3 days.  You have further bleeding.  You have a fever.  You have diarrhea mixed with blood.  You have pain.  Your problem is getting worse rather than better. MAKE SURE YOU:   Understand these instructions.  Will watch your condition.  Will get help right away if you are not doing well or get worse. Document Released: 02/25/2005 Document Revised: 05/20/2011 Document Reviewed: 08/12/2010 [facility] Patient Information 2014 [place], [place].  GETTING TO GOOD BOWEL HEALTH. Irregular bowel habits such as constipation and diarrhea can lead to many problems over time.  Having one soft bowel movement a day is the most important way to prevent further problems.  The anorectal canal is designed to handle stretching and feces to safely manage our ability to get rid of solid waste (feces, poop, stool) out of our body.  BUT, hard constipated stools can act like ripping concrete bricks and diarrhea can be a burning fire to this very sensitive area of our body, causing inflamed hemorrhoids, anal fissures, increasing risk is perirectal abscesses, abdominal pain/bloating, an making irritable bowel worse.     The goal: ONE SOFT BOWEL MOVEMENT A DAY!  To have soft, regular bowel movements:    Drink at least 8 tall glasses of water a day.     Take plenty of fiber.  Fiber is the undigested part of plant food that passes into the colon, acting s "natures broom" to encourage bowel motility and movement.  Fiber can absorb and hold large amounts of water. This results in a larger, bulkier stool, which  is soft and easier to pass. Work gradually over several weeks up to 6 servings a day of fiber (25g a day even more if needed) in the form of: o Vegetables -- Root (potatoes, carrots, turnips), leafy green (lettuce, salad greens, celery, spinach), or cooked high residue (cabbage, broccoli, etc) o Fruit -- Fresh (unpeeled skin & pulp), Dried (prunes, apricots, cherries, etc ),  or stewed ( applesauce)   o Whole grain breads, pasta, etc (whole wheat)  o Bran cereals    Bulking Agents -- This type of water-retaining fiber generally is easily obtained each day by one of the following:  o Psyllium bran -- The psyllium plant is remarkable because its ground seeds can retain so much water. This product is available as Metamucil, Konsyl, Effersyllium, Per Diem Fiber, or the less expensive generic preparation in drug and health food stores. Although labeled a laxative, it really is not a laxative.  o Methylcellulose -- This is another fiber derived from wood which also retains water. It is available as Citrucel. o Polyethylene Glycol - and "artificial" fiber commonly called Miralax or Glycolax.  It is helpful for people with gassy or bloated feelings with regular fiber o Flax Seed - a less gassy fiber than psyllium   No reading or other relaxing activity while on the toilet. If bowel movements take longer than 5 minutes, you are too constipated   AVOID CONSTIPATION.  High fiber and water intake usually takes care of this.  Sometimes a laxative is needed to stimulate more frequent bowel movements, but    Laxatives are not a good long-term solution as it can wear the colon out. o Osmotics (Milk of Magnesia, Fleets phosphosoda, Magnesium citrate, MiraLax, GoLytely) are safer than  o Stimulants (Senokot, Castor Oil, Dulcolax, Ex Lax)    o Do not take laxatives for more than 7days in a row.    IF SEVERELY CONSTIPATED, try a Bowel Retraining Program: o Do not use laxatives.  o Eat a diet high in roughage, such as bran cereals and leafy vegetables.  o Drink six (6) ounces of prune or apricot juice each morning.  o Eat two (2) large servings of stewed fruit each day.  o Take one (1) heaping tablespoon of a psyllium-based bulking agent twice a day. Use sugar-free sweetener when possible to avoid excessive calories.  o Eat a normal breakfast.  o Set aside 15 minutes after breakfast to sit on the toilet, but do  not strain to have a bowel movement.  o If you do not have a bowel movement by the third day, use an enema and repeat the above steps.    Controlling diarrhea o Switch to liquids and simpler foods for a few days to avoid stressing your intestines further. o Avoid dairy products (especially milk & ice cream) for a short time.  The intestines often can lose the ability to digest lactose when stressed. o Avoid foods that cause gassiness or bloating.  Typical foods include beans and other legumes, cabbage, broccoli, and dairy foods.  Every person has some sensitivity to other foods, so listen to our body and avoid those foods that trigger problems for you. o Adding fiber (Citrucel, Metamucil, psyllium, Miralax) gradually can help thicken stools by absorbing excess fluid and retrain the intestines to act more normally.  Slowly increase the dose over a few weeks.  Too much fiber too soon can backfire and cause cramping & bloating. o Probiotics (such as active yogurt, Align, etc) may help  repopulate the intestines and colon with normal bacteria and calm down a sensitive digestive tract.  Most studies show it to be of mild help, though, and such products can be costly. o Medicines:   Bismuth subsalicylate (ex. Kayopectate, Pepto Bismol) every 30 minutes for up to 6 doses can help control diarrhea.  Avoid if pregnant.   Loperamide (Immodium) can slow down diarrhea.  Start with two tablets (4mg  total) first and then try one tablet every 6 hours.  Avoid if you are having fevers or severe pain.  If you are not better or start feeling worse, stop all medicines and call your doctor for advice o Call your doctor if you are getting worse or not better.  Sometimes further testing (cultures, endoscopy, X-ray studies, bloodwork, etc) may be needed to help diagnose and treat the cause of the diarrhea.  ANORECTAL SURGERY: POST OP INSTRUCTIONS  1. Take your usually prescribed home medications unless otherwise  directed. 2. DIET: Follow a light bland diet the first 24 hours after arrival home, such as soup, liquids, crackers, etc.  Be sure to include lots of fluids daily.  Avoid fast food or heavy meals as your are more likely to get nauseated.  Eat a low fat the next few days after surgery.   3. PAIN CONTROL: a. Pain is best controlled by a usual combination of three different methods TOGETHER: i. Ice/Heat ii. Over the counter pain medication iii. Prescription pain medication b. Most patients will experience some swelling and discomfort in the anus/rectal area. and incisions.  Ice packs or heat (30-60 minutes up to 6 times a day) will help. Use ice for the first few days to help decrease swelling and bruising, then switch to heat such as warm towels, sitz baths, warm baths, etc to help relax tight/sore spots and speed recovery.  Some people prefer to use ice alone, heat alone, alternating between ice & heat.  Experiment to what works for you.  Swelling and bruising can take several weeks to resolve.   c. It is helpful to take an over-the-counter pain medication regularly for the first few weeks.  Choose one of the following that works best for you: i. Naproxen (Aleve, etc)  Two 220mg  tabs twice a day ii. Ibuprofen (Advil, etc) Three 200mg  tabs four times a day (every meal & bedtime) iii. Acetaminophen (Tylenol, etc) 500-650mg  four times a day (every meal & bedtime) d. A  prescription for pain medication (such as oxycodone, hydrocodone, etc) should be given to you upon discharge.  Take your pain medication as prescribed.  i. If you are having problems/concerns with the prescription medicine (does not control pain, nausea, vomiting, rash, itching, etc), please call us [phone] to see if we need to switch you to a different pain medicine that will work better for you and/or control your side effect better. ii. If you need a refill on your pain medication, please contact your pharmacy.  They will contact  our office to request authorization. Prescriptions will not be filled after 5 pm or on week-ends. 4. KEEP YOUR BOWELS REGULAR a. The goal is one bowel movement a day b. Avoid getting constipated.  Between the surgery and the pain medications, it is common to experience some constipation.  Increasing fluid intake and taking a fiber supplement (such as Metamucil, Citrucel, FiberCon, MiraLax, etc) 1-2 times a day regularly will usually help prevent this problem from occurring.  A mild laxative (prune juice, Milk of Magnesia, MiraLax, etc) should be  taken according to package directions if there are no bowel movements after 48 hours. c. Watch out for diarrhea.  If you have many loose bowel movements, simplify your diet to bland foods & liquids for a few days.  Stop any stool softeners and decrease your fiber supplement.  Switching to mild anti-diarrheal medications (Kayopectate, Pepto Bismol) can help.  If this worsens or does not improve, please call us.  5. Wound Care a. Remove your bandages the day after surgery.  Unless discharge instructions indicate otherwise, leave your bandage dry and in place overnight.  Remove the bandage during your first bowel movement.   b. Allow the wound packing to fall out over the next few days.  You can trim exposed gauze / ribbon as it falls out.  You do not need to repack the wound unless instructed otherwise.  Wear an absorbent pad or soft cotton gauze in your underwear as needed to catch any drainage and help keep the area  c. Keep the area clean and dry.  Bathe / shower every day.  Keep the area clean by showering / bathing over the incision / wound.   It is okay to soak an open wound to help wash it.  Wet wipes or showers / gentle washing after bowel movements is often less traumatic than regular toilet paper. d. Bonita Quin may have some styrofoam-like soft packing in the rectum which will come out with the first bowel movement.  e. You will often notice bleeding with bowel  movements.  This should slow down by the end of the first week of surgery f. Expect some drainage.  This should slow down, too, by the end of the first week of surgery.  Wear an absorbent pad or soft cotton gauze in your underwear until the drainage stops. 6. ACTIVITIES as tolerated:   a. You may resume regular (light) daily activities beginning the next day-such as daily self-care, walking, climbing stairs-gradually increasing activities as tolerated.  If you can walk 30 minutes without difficulty, it is safe to try more intense activity such as jogging, treadmill, bicycling, low-impact aerobics, swimming, etc. b. Save the most intensive and strenuous activity for last such as sit-ups, heavy lifting, contact sports, etc  Refrain from any heavy lifting or straining until you are off narcotics for pain control.   c. DO NOT PUSH THROUGH PAIN.  Let pain be your guide: If it hurts to do something, don't do it.  Pain is your body warning you to avoid that activity for another week until the pain goes down. d. You may drive when you are no longer taking prescription pain medication, you can comfortably sit for long periods of time, and you can safely maneuver your car and apply brakes. e. Bonita Quin may have sexual intercourse when it is comfortable.  7. FOLLOW UP in our office a. Please call [facility] at [phone] to set up an appointment to see your surgeon in the office for a follow-up appointment approximately 2 weeks after your surgery. b. Make sure that you call for this appointment the day you arrive home to insure a convenient appointment time. 10. IF YOU HAVE DISABILITY OR FAMILY LEAVE FORMS, BRING THEM TO THE OFFICE FOR PROCESSING.  DO NOT GIVE THEM TO YOUR DOCTOR.        WHEN TO CALL us [phone]: 1. Poor pain control 2. Reactions / problems with new medications (rash/itching, nausea, etc)  3. Fever over 101.5 F (38.5 C) 4. Inability to urinate  5. Nausea and/or vomiting 6. Worsening  swelling or bruising 7. Continued bleeding from incision. 8. Increased pain, redness, or drainage from the incision  The clinic staff is available to answer your questions during regular business hours (8:30am-5pm).  Please don't hesitate to call and ask to speak to one of our nurses for clinical concerns.   A surgeon from [facility] is always on call at the hospitals   If you have a medical emergency, go to the nearest emergency room or call 911.    [facility] [address], [address], [place], [place]  [address] ? MAIN: [phone] ? TOLL FREE: [phone] ? FAX [phone] [email or web address]

## 2012-10-29 ENCOUNTER — Telehealth (INDEPENDENT_AMBULATORY_CARE_PROVIDER_SITE_OTHER): Payer: Self-pay | Admitting: *Deleted

## 2012-10-29 NOTE — Telephone Encounter (Signed)
Patient called to report that he continues to have difficulty with pain at this time.  Patient states he has been using all the medication prescribed and gets better for a few days then becomes worse again.  Patient states he doesn't want to keep spending money on medication that isn't going to work for him.  Explained to patient that I do see in Dr. Gordy Savers last note on 10/08/12 where he states if patient is still having pain and complications in 3 weeks then surgery may be appropriate.  Offered to make patient an appt to come in to see Dr. Michaell Cowing so they could discuss this.  Patient states that he does want to move forward with surgery but doesn't want to come back in the office to see Dr. Michaell Cowing.  Patient states he lives further away and doesn't have transportation along with that he doesn't feel like he should have to pay a copay again.  Patient went into a long explanation of how he keeps going to doctors offices and the ED with no relief.  Patient states he believes Dr. Michaell Cowing should be able to call him on the phone to discuss surgery and move forward that way.  Patient asked that a message be sent to Dr. Michaell Cowing to ask him to just call him to formulate the plan forward.  Tried to explain to patient that is not the typical way this works but a message would be sent to ask for Dr. Gordy Savers opinion of what should happen next.  Patient states understanding and agreeable at this time to await Dr. Gordy Savers decision.

## 2012-10-30 ENCOUNTER — Other Ambulatory Visit (INDEPENDENT_AMBULATORY_CARE_PROVIDER_SITE_OTHER): Payer: Self-pay | Admitting: Surgery

## 2012-10-30 NOTE — Telephone Encounter (Signed)
Called pt to let him know that we would just go ahead and schedule pt per Dr Michaell Cowing. The surgical orders have been completed and sent to surgery schedulers. The pt understands.

## 2012-10-30 NOTE — Progress Notes (Signed)
Discussed with the patient before.  I think he needs examination under anesthesia with sphincterotomy.  I posted orders.  I will try and discuss with him later in the day. 

## 2012-11-03 ENCOUNTER — Encounter (HOSPITAL_COMMUNITY): Payer: Self-pay | Admitting: *Deleted

## 2012-11-03 ENCOUNTER — Telehealth (INDEPENDENT_AMBULATORY_CARE_PROVIDER_SITE_OTHER): Payer: Self-pay | Admitting: *Deleted

## 2012-11-03 NOTE — Telephone Encounter (Signed)
Received a call from [facility] with Pre-op regarding the pateint.  Marsh Dolly states that she attempted multiple times to discuss the instructions for surgery with the patient however the patient was difficult to talk with.  She wanted to let us know that she had advised the patient he would need to stop his Aspirin 5 days prior to the surgical date however patient became very argumentative starting this was a major cardiac medication and he did not feel it appropriate to stop.  Marsh Dolly states that she spent approximately 30 minutes discussing Aspirin with the patient.  Patient is not known to have any cardiac issues.  Patient continued to state that he feels very strongly that he should not stop his Aspirin for surgery.  Wilamina wanted to make [place] aware that she has instructed the patient to stop the Aspirin however she truly feels that patient is not going to do so.  She even explained to patient that if he does not stop the Aspirin it could delay his surgery however patient is still adamant that he should not stop Aspirin.  Explained to [facility] that a message will be sent to Dr. Michaell Cowing and Elease Hashimoto CMA to make them aware of this.

## 2012-11-04 ENCOUNTER — Encounter (HOSPITAL_COMMUNITY): Payer: Self-pay | Admitting: Pharmacy Technician

## 2012-11-04 NOTE — Progress Notes (Signed)
EKG from 2/12 obtained for reference, placed on chart

## 2012-11-04 NOTE — Telephone Encounter (Signed)
Called pt to notify him that Dr Michaell Cowing stated it was ok for pt to stay on the baby aspirin before surgery. The pt understands.

## 2012-11-10 ENCOUNTER — Encounter (INDEPENDENT_AMBULATORY_CARE_PROVIDER_SITE_OTHER): Payer: Medicare Other | Admitting: Surgery

## 2012-11-12 ENCOUNTER — Encounter (HOSPITAL_COMMUNITY): Payer: Self-pay | Admitting: *Deleted

## 2012-11-12 ENCOUNTER — Encounter (HOSPITAL_COMMUNITY)
Admission: RE | Disposition: A | Discharge status: Home / Self Care | Payer: Self-pay | Source: Ambulatory Visit | Attending: Surgery

## 2012-11-12 ENCOUNTER — Ambulatory Visit (HOSPITAL_COMMUNITY)
Admission: RE | Admit: 2012-11-12 | Discharge: 2012-11-12 | Disposition: A | Discharge status: Home / Self Care | Payer: Medicare Other | Source: Ambulatory Visit | Attending: Surgery | Admitting: Surgery

## 2012-11-12 ENCOUNTER — Ambulatory Visit (HOSPITAL_COMMUNITY): Payer: Medicare Other | Admitting: Anesthesiology

## 2012-11-12 ENCOUNTER — Encounter (HOSPITAL_COMMUNITY): Payer: Self-pay | Admitting: Anesthesiology

## 2012-11-12 DIAGNOSIS — Z720 Tobacco use: Secondary | ICD-10-CM | POA: Diagnosis present

## 2012-11-12 DIAGNOSIS — K602 Anal fissure, unspecified: Secondary | ICD-10-CM

## 2012-11-12 DIAGNOSIS — K603 Anal fistula, unspecified: Secondary | ICD-10-CM | POA: Insufficient documentation

## 2012-11-12 DIAGNOSIS — F418 Other specified anxiety disorders: Secondary | ICD-10-CM | POA: Diagnosis present

## 2012-11-12 DIAGNOSIS — K219 Gastro-esophageal reflux disease without esophagitis: Secondary | ICD-10-CM | POA: Insufficient documentation

## 2012-11-12 DIAGNOSIS — K620 Anal polyp: Secondary | ICD-10-CM | POA: Insufficient documentation

## 2012-11-12 DIAGNOSIS — F6089 Other specific personality disorders: Secondary | ICD-10-CM | POA: Diagnosis present

## 2012-11-12 HISTORY — DX: Personal history of colon polyps, unspecified: Z86.0100

## 2012-11-12 HISTORY — PX: EXAMINATION UNDER ANESTHESIA: SHX1540

## 2012-11-12 HISTORY — PX: SPHINCTEROTOMY: SHX5279

## 2012-11-12 HISTORY — DX: Personal history of colonic polyps: Z86.010

## 2012-11-12 SURGERY — SPHINCTEROTOMY, ANAL
Anesthesia: General | Site: Anus | Wound class: Dirty or Infected

## 2012-11-12 MED ORDER — CHLORHEXIDINE GLUCONATE 4 % EX LIQD: 1.0000 "application " | Freq: Once | CUTANEOUS | Status: DC

## 2012-11-12 MED ORDER — CISATRACURIUM BESYLATE (PF) 10 MG/5ML IV SOLN
INTRAVENOUS | Status: DC | PRN
  Administered 2012-11-12: 3 mg via INTRAVENOUS

## 2012-11-12 MED ORDER — SODIUM CHLORIDE 0.9 % IV SOLN: 250.0000 mL | INTRAVENOUS | Status: DC | PRN

## 2012-11-12 MED ORDER — LIP MEDEX EX OINT: 1.0000 "application " | TOPICAL_OINTMENT | Freq: Two times a day (BID) | CUTANEOUS | Status: DC

## 2012-11-12 MED ORDER — LACTATED RINGERS IV SOLN
INTRAVENOUS | Status: DC
  Administered 2012-11-12: 1000 mL via INTRAVENOUS

## 2012-11-12 MED ORDER — MIDAZOLAM HCL 5 MG/5ML IJ SOLN
INTRAMUSCULAR | Status: DC | PRN
  Administered 2012-11-12: 2 mg via INTRAVENOUS

## 2012-11-12 MED ORDER — DIPHENHYDRAMINE HCL 50 MG/ML IJ SOLN: 12.5000 mg | Freq: Four times a day (QID) | INTRAMUSCULAR | Status: DC | PRN

## 2012-11-12 MED ORDER — OXYCODONE HCL 5 MG PO TABS: 5.0000 mg | ORAL_TABLET | ORAL | Status: DC | PRN

## 2012-11-12 MED ORDER — FENTANYL CITRATE 0.05 MG/ML IJ SOLN
INTRAMUSCULAR | Status: DC | PRN
  Administered 2012-11-12 (×2): 50 ug via INTRAVENOUS
  Administered 2012-11-12: 150 ug via INTRAVENOUS
  Administered 2012-11-12: 50 ug via INTRAVENOUS

## 2012-11-12 MED ORDER — MENTHOL 3 MG MT LOZG: 1.0000 | LOZENGE | OROMUCOSAL | Status: DC | PRN

## 2012-11-12 MED ORDER — SODIUM CHLORIDE 0.9 % IJ SOLN: 3.0000 mL | Freq: Two times a day (BID) | INTRAMUSCULAR | Status: DC

## 2012-11-12 MED ORDER — BUPIVACAINE-EPINEPHRINE PF 0.25-1:200000 % IJ SOLN
INTRAMUSCULAR | Status: AC
  Filled 2012-11-12: qty 30

## 2012-11-12 MED ORDER — LORAZEPAM 2 MG/ML IJ SOLN: 0.5000 mg | Freq: Three times a day (TID) | INTRAMUSCULAR | Status: DC | PRN

## 2012-11-12 MED ORDER — METOPROLOL TARTRATE 1 MG/ML IV SOLN: 5.0000 mg | Freq: Four times a day (QID) | INTRAVENOUS | Status: DC | PRN

## 2012-11-12 MED ORDER — NAPROXEN 500 MG PO TABS: 500.0000 mg | ORAL_TABLET | Freq: Two times a day (BID) | ORAL | Status: DC

## 2012-11-12 MED ORDER — BUPIVACAINE LIPOSOME 1.3 % IJ SUSP
20.0000 mL | Freq: Once | INTRAMUSCULAR | Status: DC
  Filled 2012-11-12: qty 20

## 2012-11-12 MED ORDER — ONDANSETRON HCL 4 MG/2ML IJ SOLN: 4.0000 mg | Freq: Four times a day (QID) | INTRAMUSCULAR | Status: DC | PRN

## 2012-11-12 MED ORDER — PRAMOXINE HCL 1 % RE FOAM: RECTAL | Status: DC | PRN

## 2012-11-12 MED ORDER — FENTANYL CITRATE 0.05 MG/ML IJ SOLN: 25.0000 ug | INTRAMUSCULAR | Status: DC | PRN

## 2012-11-12 MED ORDER — BUPIVACAINE LIPOSOME 1.3 % IJ SUSP
INTRAMUSCULAR | Status: DC | PRN
  Administered 2012-11-12: 20 mL

## 2012-11-12 MED ORDER — PROMETHAZINE HCL 25 MG/ML IJ SOLN: 12.5000 mg | Freq: Four times a day (QID) | INTRAMUSCULAR | Status: DC | PRN

## 2012-11-12 MED ORDER — LIDOCAINE HCL (CARDIAC) 20 MG/ML IV SOLN
INTRAVENOUS | Status: DC | PRN
  Administered 2012-11-12: 100 mg via INTRAVENOUS

## 2012-11-12 MED ORDER — ALUM & MAG HYDROXIDE-SIMETH 200-200-20 MG/5ML PO SUSP: 30.0000 mL | Freq: Four times a day (QID) | ORAL | Status: DC | PRN

## 2012-11-12 MED ORDER — DEXTROSE 5 % IV SOLN
2.0000 g | INTRAVENOUS | Status: AC
  Administered 2012-11-12: 2 g via INTRAVENOUS
  Filled 2012-11-12: qty 2

## 2012-11-12 MED ORDER — MAGIC MOUTHWASH: 15.0000 mL | Freq: Four times a day (QID) | ORAL | Status: DC | PRN

## 2012-11-12 MED ORDER — SODIUM CHLORIDE 0.9 % IJ SOLN: 3.0000 mL | INTRAMUSCULAR | Status: DC | PRN

## 2012-11-12 MED ORDER — ASPIRIN 81 MG PO TABS: 81.0000 mg | ORAL_TABLET | Freq: Two times a day (BID) | ORAL | Status: DC

## 2012-11-12 MED ORDER — PROPOFOL 10 MG/ML IV BOLUS
INTRAVENOUS | Status: DC | PRN
  Administered 2012-11-12: 50 mg via INTRAVENOUS
  Administered 2012-11-12: 150 mg via INTRAVENOUS

## 2012-11-12 MED ORDER — GLYCOPYRROLATE 0.2 MG/ML IJ SOLN
INTRAMUSCULAR | Status: DC | PRN
  Administered 2012-11-12: 0.2 mg via INTRAVENOUS

## 2012-11-12 MED ORDER — CEFOXITIN SODIUM-DEXTROSE 1-4 GM-% IV SOLR (PREMIX)
INTRAVENOUS | Status: AC
  Filled 2012-11-12: qty 100

## 2012-11-12 SURGICAL SUPPLY — 45 items
APPLICATOR COTTON TIP 6IN STRL (MISCELLANEOUS) ×3 IMPLANT
BLADE HEX COATED 2.75 (ELECTRODE) ×3 IMPLANT
BLADE SURG 15 STRL LF DISP TIS (BLADE) ×4 IMPLANT
BLADE SURG 15 STRL SS (BLADE) ×6
BLADE SURG SZ10 CARB STEEL (BLADE) ×3 IMPLANT
CANISTER SUCTION 2500CC (MISCELLANEOUS) ×3 IMPLANT
CLOTH BEACON ORANGE TIMEOUT ST (SAFETY) ×3 IMPLANT
DECANTER SPIKE VIAL GLASS SM (MISCELLANEOUS) ×3 IMPLANT
DRAPE LAPAROTOMY T 102X78X121 (DRAPES) IMPLANT
DRAPE LG THREE QUARTER DISP (DRAPES) ×3 IMPLANT
DRSG PAD ABDOMINAL 8X10 ST (GAUZE/BANDAGES/DRESSINGS) IMPLANT
ELECT REM PT RETURN 9FT ADLT (ELECTROSURGICAL) ×3
ELECTRODE REM PT RTRN 9FT ADLT (ELECTROSURGICAL) ×2 IMPLANT
GAUZE SPONGE 4X4 16PLY XRAY LF (GAUZE/BANDAGES/DRESSINGS) ×3 IMPLANT
GLOVE BIOGEL PI IND STRL 7.0 (GLOVE) ×2 IMPLANT
GLOVE BIOGEL PI INDICATOR 7.0 (GLOVE) ×1
GLOVE ECLIPSE 8.0 STRL XLNG CF (GLOVE) ×3 IMPLANT
GLOVE INDICATOR 8.0 STRL GRN (GLOVE) ×6 IMPLANT
GOWN STRL NON-REIN LRG LVL3 (GOWN DISPOSABLE) ×3 IMPLANT
GOWN STRL REIN XL XLG (GOWN DISPOSABLE) ×6 IMPLANT
HEMOSTAT SURGICEL 4X8 (HEMOSTASIS) ×3 IMPLANT
KIT BASIN OR (CUSTOM PROCEDURE TRAY) ×3 IMPLANT
LEGGING LITHOTOMY PAIR STRL (DRAPES) IMPLANT
LUBRICANT JELLY K Y 4OZ (MISCELLANEOUS) ×3 IMPLANT
NDL SAFETY ECLIPSE 18X1.5 (NEEDLE) IMPLANT
NEEDLE HYPO 18GX1.5 SHARP (NEEDLE)
NEEDLE HYPO 22GX1.5 SAFETY (NEEDLE) ×3 IMPLANT
NS IRRIG 1000ML POUR BTL (IV SOLUTION) ×3 IMPLANT
PACK BASIC VI WITH GOWN DISP (CUSTOM PROCEDURE TRAY) ×3 IMPLANT
PACK LITHOTOMY IV (CUSTOM PROCEDURE TRAY) ×3 IMPLANT
PENCIL BUTTON HOLSTER BLD 10FT (ELECTRODE) ×3 IMPLANT
SPONGE GAUZE 4X4 12PLY (GAUZE/BANDAGES/DRESSINGS) ×3 IMPLANT
SPONGE LAP 18X18 X RAY DECT (DISPOSABLE) IMPLANT
SPONGE SURGIFOAM ABS GEL 12-7 (HEMOSTASIS) IMPLANT
SUT CHROMIC 2 0 SH (SUTURE) IMPLANT
SUT CHROMIC 3 0 SH 27 (SUTURE) IMPLANT
SUT MON AB 3-0 SH 27 (SUTURE) ×6
SUT MON AB 3-0 SH27 (SUTURE) ×4 IMPLANT
SUT VIC AB 2-0 SH 27 (SUTURE)
SUT VIC AB 2-0 SH 27X BRD (SUTURE) IMPLANT
SUT VIC AB 4-0 SH 18 (SUTURE) IMPLANT
SYR CONTROL 10ML LL (SYRINGE) ×3 IMPLANT
TOWEL OR 17X26 10 PK STRL BLUE (TOWEL DISPOSABLE) ×6 IMPLANT
YANKAUER SUCT BULB TIP 10FT TU (MISCELLANEOUS) ×3 IMPLANT
YANKAUER SUCT BULB TIP NO VENT (SUCTIONS) IMPLANT

## 2012-11-12 NOTE — Anesthesia Preprocedure Evaluation (Addendum)
Anesthesia Evaluation  Patient identified by MRN, date of birth, ID band Patient awake    Reviewed: Allergy & Precautions, H&P , NPO status , Patient's Chart, lab work & pertinent test results  Airway Mallampati: II TM Distance: >3 FB Neck ROM: Full    Dental  (+) Dental Advisory Given   Pulmonary Current Smoker,  breath sounds clear to auscultation        Cardiovascular negative cardio ROS  Rhythm:Regular Rate:Normal     Neuro/Psych PSYCHIATRIC DISORDERS Anxiety Depression negative neurological ROS     GI/Hepatic GERD-  Medicated,(+)     substance abuse  alcohol use, cocaine use and methamphetamine use,   Endo/Other  negative endocrine ROS  Renal/GU negative Renal ROS     Musculoskeletal negative musculoskeletal ROS (+)   Abdominal   Peds  Hematology negative hematology ROS (+)   Anesthesia Other Findings   Reproductive/Obstetrics                          Anesthesia Physical Anesthesia Plan  ASA: III  Anesthesia Plan: General   Post-op Pain Management:    Induction: Intravenous  Airway Management Planned: Oral ETT and LMA  Additional Equipment:   Intra-op Plan:   Post-operative Plan: Extubation in OR  Informed Consent: I have reviewed the patients History and Physical, chart, labs and discussed the procedure including the risks, benefits and alternatives for the proposed anesthesia with the patient or authorized representative who has indicated his/her understanding and acceptance.   Dental advisory given  Plan Discussed with: CRNA  Anesthesia Plan Comments:         Anesthesia Quick Evaluation

## 2012-11-12 NOTE — Transfer of Care (Signed)
Immediate Anesthesia Transfer of Care Note  Patient: Austin Cobb  Procedure(s) Performed: Procedure(s): SPHINCTEROTOMY (N/A) EXAM UNDER ANESTHESIA (N/A)  Patient Location: PACU  Anesthesia Type:General  Level of Consciousness: awake and oriented  Airway & Oxygen Therapy: Patient Spontanous Breathing and Patient connected to face mask oxygen  Post-op Assessment: Report given to PACU RN and Post -op Vital signs reviewed and stable  Post vital signs: Reviewed and stable  Complications: No apparent anesthesia complications

## 2012-11-12 NOTE — Preoperative (Signed)
Beta Blockers   Reason not to administer Beta Blockers:Not Applicable 

## 2012-11-12 NOTE — H&P (View-Only) (Signed)
Discussed with the patient before.  I think he needs examination under anesthesia with sphincterotomy.  I posted orders.  I will try and discuss with him later in the day.

## 2012-11-12 NOTE — Interval H&P Note (Signed)
History and Physical Interval Note:  11/12/2012 10:14 AM  Austin Cobb  has presented today for surgery, with the diagnosis of chronic anal fissure  The various methods of treatment have been discussed with the patient and family. After consideration of risks, benefits and other options for treatment, the patient has consented to  Procedure(s): SPHINCTEROTOMY (N/A) EXAM UNDER ANESTHESIA (N/A) as a surgical intervention .  The patient's history has been reviewed, patient examined, no change in status, stable for surgery.  I have reviewed the patient's chart and labs.  Questions were answered to the patient's satisfaction.     [name]

## 2012-11-12 NOTE — Anesthesia Procedure Notes (Signed)
Procedure Name: Intubation Date/Time: 11/12/2012 10:56 AM Performed by: Leroy Libman L Patient Re-evaluated:Patient Re-evaluated prior to inductionOxygen Delivery Method: Circle system utilized Preoxygenation: Pre-oxygenation with 100% oxygen Intubation Type: IV induction Ventilation: Mask ventilation without difficulty and Oral airway inserted - appropriate to patient size Laryngoscope Size: 3 and Miller Grade View: Grade I Tube type: Oral Tube size: 8.0 mm Number of attempts: 1 Airway Equipment and Method: Stylet Placement Confirmation: ETT inserted through vocal cords under direct vision,  breath sounds checked- equal and bilateral and positive ETCO2 Secured at: 21 cm Tube secured with: Tape Dental Injury: Teeth and Oropharynx as per pre-operative assessment

## 2012-11-12 NOTE — Op Note (Signed)
11/12/2012  11:54 AM  PATIENT:  Austin Cobb  52 y.o. male  Patient Care Team: Peyton Najjar, MD as PCP - General (Family Medicine) Louis Meckel, MD as Consulting Physician (Gastroenterology)  PRE-OPERATIVE DIAGNOSIS:  chronic anal fissure  POST-OPERATIVE DIAGNOSIS:  chronic anal fissure  PROCEDURE:  Procedure(s): SPHINCTEROTOMY EXAM UNDER ANESTHESIA  SURGEON:  Surgeon(s): Ardeth Sportsman, MD  ANESTHESIA:   local and general  EBL:     Delay start of Pharmacological VTE agent (>24hrs) due to surgical blood loss or risk of bleeding:  no  DRAINS: none   SPECIMEN:  Source of Specimen:  Posterior anal fissure  DISPOSITION OF SPECIMEN:  PATHOLOGY  COUNTS:  YES  PLAN OF CARE: Discharge to home after PACU  PATIENT DISPOSITION:  PACU - hemodynamically stable.  INDICATION: Patient with chronic anal fissure.  He tried diltiazem without relief.  I recommended examination and surgical treatment:  The anatomy & physiology of the anorectal region was discussed.  The pathophysiology of anal fissure and differential diagnosis was discussed.  Natural history progression  was discussed.   I stressed the importance of a bowel regimen to have daily soft bowel movements to minimize progression of disease.     The patient's condition is not adequately controlled.  Non-operative treatment has not healed the fissure.  Therefore, I recommended examination under anesthesia for better examination to confirm the diagnosis and treat by lateral internal sphincterotomy to relax the spasm better & allow the fissure to heal.  Technique, benefits, alternatives were discussed.   I noted a good likelihood this will help address the problem.  Risks such as bleeding, pain, incontinence, recurrence, heart attack, death, and other risks were discussed.    Educational handouts further explaining the pathology, treatment options, and bowel regimen were given as well.  The patient expressed understanding &  wishes to proceed with surgery.    OR FINDINGS: Patient had a chronic posterior midline fissure with Hypertrophic sentinel tags and partial granulation.  No evidence of fissure.  No abscess.  No hemorrhoids.    DESCRIPTION:   Informed consent was confirmed. Patient underwent general anesthesia without difficulty. Patient was placed into prone positioning.  The perianal region was prepped and draped in sterile fashion. Surgical timeout confirmed or plan.  I did digital rectal examination and then transitioned over to anoscopy to get a sense of the anatomy.  With gentle finger and anoscopic dilation, I could examine the rectum.  Prostate seems normal.  He had a posterior midline fissure.  It was partially granulated but not fully closed.  There was a fibrous brain causing a ridge keeping it from closing.  It was not part of the sphincter complex.  I went ahead and excised heaped up anal perianal tags to have a more flattened area now.  Excised scar as well.  I made sure that I stayed out of the sphincter complex.  Healthier tissue exposed.  Sphincter complex left alone.  I left the wound open.  I then proceeded with a lateral sphincterotomy.  Left lateral anal canal was identified.  I entered through the mucosa longitudinally.  I was able to find the internal sphincter complex.  Elevated it.  I partially transected the distal half.  I allowed the sphincter to be more fully relaxed.  This was at the apex of the fissure.  I reexamined the anal canal.   There is was no narrowing.  Hemostasis was excellent.  I repeated anoscopy and examination.  Hemostasis  was good. Patient is extubated in recovery room.  There is no one here to discuss operative findings but I will try and reach them by phone.  Instructions are written as well.

## 2012-11-12 NOTE — Anesthesia Postprocedure Evaluation (Signed)
Anesthesia Post Note  Patient: Austin Cobb  Procedure(s) Performed: Procedure(s) (LRB): SPHINCTEROTOMY (N/A) EXAM UNDER ANESTHESIA (N/A)  Anesthesia type: General  Patient location: PACU  Post pain: Pain level controlled  Post assessment: Post-op Vital signs reviewed  Last Vitals: BP 156/104  Pulse 80  Temp(Src) 36.2 C (Oral)  Resp 16  Ht 5\' 10"  (1.778 m)  Wt 155 lb (70.308 kg)  BMI 22.24 kg/m2  SpO2 100%  Post vital signs: Reviewed  Level of consciousness: sedated  Complications: No apparent anesthesia complications

## 2012-11-13 ENCOUNTER — Encounter (HOSPITAL_COMMUNITY): Payer: Self-pay | Admitting: Surgery

## 2012-11-18 ENCOUNTER — Telehealth (INDEPENDENT_AMBULATORY_CARE_PROVIDER_SITE_OTHER): Payer: Self-pay | Admitting: *Deleted

## 2012-11-18 NOTE — Telephone Encounter (Signed)
Returned pt's call. I notified him that Dr Michaell Cowing did check his prostate during the rectal exam and it appeared normal with no masses or tumor. I advised pt that Dr Michaell Cowing did not do much beyond that b/c the rest would be up to a urologist if the pt has any more concerns. I did advise if the pt still concerned about the prostate we could set him up with an urologist. The pt is very pleased that Dr Michaell Cowing did check the prostate and is ok for now. The pt has a f/u appt on 12/01/12 with Dr Michaell Cowing.

## 2012-11-18 NOTE — Telephone Encounter (Signed)
I did check his prostate during the rectal exam.  It appeared to be normal.  No masses or tumor.  Not particularly enlarged.  I did not do much beyond this as I am not a urologist.  If he is still concerned about this, he can see an urologist for more formal evaluation

## 2012-11-18 NOTE — Telephone Encounter (Signed)
Patient called in today to ask if Dr. Michaell Cowing during the surgery checked him for prostate issues and/or cancer.  Patient states he told all of the hospital staff that he wanted him to check him for this and didn't know if he did.  Explained to patient that it would be abnormal for Dr. Michaell Cowing to due this and there is no documentation of this.  Patient states he wants me to ask Dr. Michaell Cowing to see if he did though.  Explained to patient that Dr. Michaell Cowing is unavailable this afternoon however I will send a message to him to ask about this.  Patient states understanding at this time.

## 2012-11-19 ENCOUNTER — Telehealth (INDEPENDENT_AMBULATORY_CARE_PROVIDER_SITE_OTHER): Payer: Self-pay

## 2012-11-19 NOTE — Telephone Encounter (Signed)
Pt called wanting to know when he will be able to have a colonoscopy after his surgery. Pt advised he should wait to have his follow up exam with Dr Michaell Cowing for him to determine when pt will be healed enough for pt to have colonoscopy.Pt states he understands and will keep f/u appt.

## 2012-12-01 ENCOUNTER — Encounter (INDEPENDENT_AMBULATORY_CARE_PROVIDER_SITE_OTHER): Payer: Medicare Other | Admitting: Surgery

## 2012-12-07 NOTE — H&P (Signed)
Austin Cobb  11-Mar-1961 [id number]  CARE TEAM:  PCP: Janace Hoard, MD  Outpatient Care Team: Patient Care Team: Peyton Najjar, MD as PCP - General (Family Medicine) Louis Meckel, MD as Consulting Physician (Gastroenterology)  Inpatient Treatment Team:   This patient is a 52 y.o.male who presents today for surgical evaluation at the request of Dr. Alwyn Ren.   Reason for visit: Persitent anal pain with fissure   Smoking male with anal fissure diagnosed by me in March 2014. He tried many over-the-counter treatments including Aquaphor and steroid creams. It made it more tolerable but not healing. I recommended a prescription of diltiazem and if that did not heal, surgical sphincterotomy. I also recommended he stop smoking.  Returns with similar complaints. He did not fill the prescription for diltiazem as the $28 copay was not affordable to him at the time. He tells me is in a much better financial situation. Please see my note from March for detailed past history/story on this.  He has a history of serrated adenoma seen on colon screening colonoscopy two years ago. Due for followup colonoscopy in 2015. No personal nor family history of GI/colon cancer, inflammatory bowel disease, irritable bowel syndrome, allergy such as Celiac Sprue, dietary/dairy problems, colitis, ulcers nor gastritis. No recent sick contacts/gastroenteritis. No travel outside the country. No changes in diet.   Past Medical History  Diagnosis Date  . Arthritis   . Anxiety   . GERD (gastroesophageal reflux disease)   . Substance abuse     54yrs ago smoke crack and marjuana  . Bipolar 1 disorder   . Manic disorder   . Tobacco abuse 05/13/2012  . Depression History of Bipolar    not on meds/pt. stopped meds on own over 12 yrs ago  . History of colon polyps     Past Surgical History  Procedure Laterality Date  . Eye surgery      bilateral eye sx as a child 6-25yrs old.  . Tonsillectomy    . Sphincterotomy N/A  11/12/2012    Procedure: SPHINCTEROTOMY;  Surgeon: Ardeth Sportsman, MD;  Location: [facility];  Service: General;  Laterality: N/A;  . Examination under anesthesia N/A 11/12/2012    Procedure: Francia Greaves UNDER ANESTHESIA;  Surgeon: Ardeth Sportsman, MD;  Location: [facility];  Service: General;  Laterality: N/A;    History   Social History  . Marital Status: Divorced    Spouse Name: N/A    Number of Children: N/A  . Years of Education: N/A   Occupational History  . Not on file.   Social History Main Topics  . Smoking status: Current Every Day Smoker -- 1.00 packs/day for 35 years    Types: Cigarettes  . Smokeless tobacco: Never Used  . Alcohol Use: 7.2 oz/week    12 Cans of beer per week  . Drug Use: Yes    Special: Cocaine, Marijuana     Comment: Quit x6 yrs ago-   . Sexual Activity: No   Other Topics Concern  . Not on file   Social History Narrative  . No narrative on file    Family History  Problem Relation Age of Onset  . Colon polyps Father   . Diabetes Father   . Diabetes Brother     No current facility-administered medications for this encounter.   Current Outpatient Prescriptions  Medication Sig Dispense Refill  . aspirin 81 MG tablet Take 81 mg by mouth 2 (two) times daily.       Marland Kitchen  oxyCODONE (OXY IR/ROXICODONE) 5 MG immediate release tablet Take 1-2 tablets (5-10 mg total) by mouth every 4 (four) hours as needed for pain.  50 tablet  0  . pramoxine (PROCTOFOAM) 1 % foam Place rectally every 4 (four) hours as needed for hemorrhoids.  15 g  2     No Known Allergies  ROS: Constitutional:  No fevers, chills, sweats.  Weight stable Eyes:  No vision changes, No discharge HENT:  No sore throats, nasal drainage Lymph: No neck swelling, No bruising easily Pulmonary:  No cough, productive sputum CV: No orthopnea, PND  .  No exertional chest/neck/shoulder/arm pain. GI:  No recent sick contacts/gastroenteritis.  No travel outside the country.  No changes in diet. Renal: No  UTIs, No hematuria Genital:  No drainage, bleeding, masses Musculoskeletal: No severe joint pain.  Good ROM major joints Skin:  No sores or lesions.  No rashes Heme/Lymph:  No easy bleeding.  No swollen lymph nodes Neuro: No focal weakness/numbness.  No seizures Psych: No suicidal ideation.  No hallucinations  BP 164/99  Pulse 84  Temp(Src) 97.2 F (36.2 C) (Oral)  Resp 18  Ht 5\' 10"  (1.778 m)  Wt 155 lb (70.308 kg)  BMI 22.24 kg/m2  SpO2 100%  Physical Exam: General: Pt awake/alert/oriented x4 in no major acute distress Eyes: PERRL, normal EOM. Sclera nonicteric Neuro: CN II-XII intact w/o focal sensory/motor deficits. Lymph: No head/neck/groin lymphadenopathy Psych:  No severe delerium/psychosis/paranoia.  Occ talking to himself/pressured speech but mild HENT: Normocephalic, Mucus membranes moist.  No thrush Neck: Supple, No tracheal deviation Chest: No pain.  Good respiratory excursion. CV:  Pulses intact.  Regular rhythm Abdomen: Soft, Nondistended. Nontender.  No incarcerated hernias. Ext:  SCDs BLE.  No significant edema.  No cyanosis Skin: No petechiae / purpurea.  No major sores Musculoskeletal: No severe joint pain.  Good ROM major joints   Results:   Labs: No results found for this or any previous visit (from the past 48 hour(s)).  Imaging / Studies: No results found.  Medications / Allergies: per chart  Antibiotics: Anti-infectives   Start     Dose/Rate Route Frequency Ordered Stop   11/12/12 0801  cefOXitin (MEFOXIN) 2 g in dextrose 5 % 50 mL IVPB     2 g 100 mL/hr over 30 Minutes Intravenous On call to O.R. 11/12/12 0801 11/12/12 1105      Assessment  Austin Cobb  52 y.o. male  25 Days Post-Op  Procedure(s): SPHINCTEROTOMY EXAM UNDER ANESTHESIA  Problem List:  Principal Problem:   Anal fissure Active Problems:   Depression with anxiety   Tobacco abuse   Hypomanic personality   Chronic anal fissure  Plan:  Surgery/EUA:  The  anatomy & physiology of the anorectal region was discussed.  The pathophysiology of anal fissure and differential diagnosis was discussed.  Natural history progression  was discussed.   I stressed the importance of a bowel regimen to have daily soft bowel movements to minimize progression of disease.     The patient's condition is not adequately controlled.  Non-operative treatment has not healed the fissure.  Therefore, I recommended examination under anesthesia for better examination to confirm the diagnosis and treat by lateral internal sphincterotomy to relax the spasm better & allow the fissure to heal.  Technique, benefits, alternatives were discussed.   I noted a good likelihood this will help address the problem.  Risks such as bleeding, pain, incontinence, recurrence, heart attack, death, and other risks were discussed.  Educational handouts further explaining the pathology, treatment options, and bowel regimen were given as well.  The patient expressed understanding & wishes to proceed with surgery.      -VTE prophylaxis- SCDs, etc -mobilize as tolerated to help recovery    Ardeth Sportsman, M.D., F.A.C.S. Gastrointestinal and Minimally Invasive Surgery [facility] [address] [place], [place] [address] [phone] Main / Paging   12/07/2012

## 2013-05-21 ENCOUNTER — Encounter: Payer: Self-pay | Admitting: Gastroenterology

## 2013-07-13 ENCOUNTER — Encounter: Payer: Self-pay | Admitting: Gastroenterology

## 2013-09-14 ENCOUNTER — Encounter: Payer: Medicare Other | Admitting: Gastroenterology

## 2013-09-28 ENCOUNTER — Encounter: Payer: Medicare Other | Admitting: Gastroenterology

## 2013-11-29 ENCOUNTER — Encounter: Payer: Self-pay | Admitting: Gastroenterology

## 2014-01-07 ENCOUNTER — Encounter: Payer: Self-pay | Admitting: Gastroenterology

## 2014-02-15 ENCOUNTER — Ambulatory Visit (AMBULATORY_SURGERY_CENTER): Payer: Self-pay

## 2014-02-15 VITALS — Ht 70.0 in | Wt 151.0 lb

## 2014-02-15 DIAGNOSIS — Z86010 Personal history of colonic polyps: Secondary | ICD-10-CM

## 2014-02-15 MED ORDER — NA SULFATE-K SULFATE-MG SULF 17.5-3.13-1.6 GM/177ML PO SOLN: ORAL | Status: DC

## 2014-02-15 NOTE — Progress Notes (Signed)
Per pt, no allergies to soy or egg products.Pt not taking any weight loss meds or using  O2 at home. 

## 2014-02-23 ENCOUNTER — Encounter: Payer: Medicare Other | Admitting: Gastroenterology

## 2014-03-08 ENCOUNTER — Telehealth: Payer: Self-pay

## 2014-03-08 NOTE — Telephone Encounter (Signed)
Patient has discomfort in his crotch area.  He wants to know if the doctor can evaluate him over the phone.  He is scheduled for a colonoscopy in late January.   [phone]

## 2014-03-08 NOTE — Telephone Encounter (Signed)
Austin Cobb,  Pt called and had questions regarding [employer]. Pt said [employer] dropped him without him even knowing; so pt switched to [facility]. His [employer] will not take in effect until January 1st. [employer] switched his PCP to a different facility. Pt is concerned that his [employer] will not be accepted at our clinic. I informed pt when he receives his card to call them and have his PCP switched back to Dr. Linna Darner and then he should not have any issues. Was this correct to say? I told pt I would put in a message and he would get a call back. Pt thinks he has spoke with you in the past. I'm sorry if this sounds confusing. I was a little confused myself.   Thank you, Nan Maya

## 2014-03-08 NOTE — Telephone Encounter (Signed)
Spoke to pt. He is having discomfort around his scrotum. He has noticed changes in urination. His stream is slower and it can be difficult to start urinating and he also feels like it takes him longer to urinate. He thinks it might be related to his prostate. He wanted to just have the doctor who does his colonoscopy in late January to look at his prostate. I informed him he should be seen before late January.  Pt also has issues with insurance. He was dropped by [facility] and will start [facility] beginning January 1st. [employer] switched his PCP to another facility so he cannot call and change that until he receives his card. Pt is going to hold off on coming into the clinic until he gets his insurance figured out.  Anything else I should have told him?

## 2014-03-09 NOTE — Telephone Encounter (Signed)
Advice given to patient, to RTC for evaluation, was correct.  Patient could call the [facility] [phone] to see if they can see him, or we can do a payment agreement, or he can go to the facility where [facility] has assigned him.

## 2014-03-09 NOTE — Telephone Encounter (Signed)
Spoke to pt- he has his insurance information but does not have the card. Advised pt to RTC with this information and we can still see him. He now has [employer]. Pt is going to wait for the weather to calm down and come in.

## 2014-03-14 ENCOUNTER — Telehealth: Payer: Self-pay | Admitting: Gastroenterology

## 2014-03-14 NOTE — Telephone Encounter (Signed)
Patient has not received new insurance card from [employer]. Went to pick up prep and was unable to. Pharmacist talked to patient about cost and colyte. Explained to patient that this is not a preferred prep with our providers and we could change to Miralax split dose prep and he would not need a prescription as all medications needed for this prep are over the counter. Discussed with patient what he would need to purchase. Informed him that I would print off instructions and mail to him today. If he had not received instructions by Friday then to please call our office to have resent. Also encouraged him to call if questions after receiving them. Verified address.

## 2014-03-24 ENCOUNTER — Encounter: Payer: Self-pay | Admitting: *Deleted

## 2014-03-24 NOTE — Telephone Encounter (Signed)
error 

## 2014-04-08 ENCOUNTER — Encounter: Payer: Self-pay | Admitting: Gastroenterology

## 2014-04-08 ENCOUNTER — Ambulatory Visit (AMBULATORY_SURGERY_CENTER): Payer: Commercial Managed Care - HMO | Admitting: Gastroenterology

## 2014-04-08 VITALS — BP 171/95 | HR 61 | Temp 97.2°F | Resp 15 | Ht 70.0 in | Wt 151.0 lb

## 2014-04-08 DIAGNOSIS — F319 Bipolar disorder, unspecified: Secondary | ICD-10-CM | POA: Diagnosis not present

## 2014-04-08 DIAGNOSIS — Z86010 Personal history of colonic polyps: Secondary | ICD-10-CM

## 2014-04-08 MED ORDER — SODIUM CHLORIDE 0.9 % IV SOLN: 500.0000 mL | INTRAVENOUS | Status: DC

## 2014-04-08 NOTE — Op Note (Signed)
[facility]  [address] [place], [id number]   COLONOSCOPY PROCEDURE REPORT  PATIENT: Austin, Cobb  MR#: [id number] BIRTHDATE: 07-02-1960 , 85  yrs. old GENDER: male ENDOSCOPIST: Inda Castle, MD REFERRED LA:GTXMI Hopper, M.D. PROCEDURE DATE:  04/08/2014 PROCEDURE:   Colonoscopy, diagnostic First Screening Colonoscopy - Avg.  risk and is 50 yrs.  old or older - No.  Prior Negative Screening - Now for repeat screening. N/A  History of Adenoma - Now for follow-up colonoscopy & has been > or = to 3 yrs.  Yes hx of adenoma.  Has been 3 or more years since last colonoscopy.  Polyps Removed Today? No.  Recommend repeat exam, <10 yrs? Yes.  High risk (family or personal hx). ASA CLASS:   Class II INDICATIONS:high risk personal history of colonic polyps.  Multiple polyps 2012 MEDICATIONS: Monitored anesthesia care and Propofol 330 mg IV  DESCRIPTION OF PROCEDURE:   After the risks benefits and alternatives of the procedure were thoroughly explained, informed consent was obtained.  The digital rectal exam revealed no abnormalities of the rectum.   The [id number]  endoscope was introduced through the anus and advanced to the cecum, which was identified by both the appendix and ileocecal valve. No adverse events experienced.   The quality of the prep was Suprep good  The instrument was then slowly withdrawn as the colon was fully examined.      COLON FINDINGS: A normal appearing cecum, ileocecal valve, and appendiceal orifice were identified.  The ascending, transverse, descending, sigmoid colon, and rectum appeared unremarkable. Retroflexed views revealed no abnormalities. The time to cecum=3 minutes 51 seconds.  Withdrawal time=8 minutes 13 seconds.  The scope was withdrawn and the procedure completed. COMPLICATIONS: There were no immediate complications.  ENDOSCOPIC IMPRESSION: Normal colonoscopy  RECOMMENDATIONS: Colonoscopy 3  years  eSigned:  Inda Castle, MD 04/08/2014 3:10 PM   cc:

## 2014-04-08 NOTE — Patient Instructions (Signed)
Impressions/recommendations:  Normal colonoscopy.  Repeat colonoscopy 7 years verbally per Dr. Deatra Ina.  YOU HAD AN ENDOSCOPIC PROCEDURE TODAY AT [facility]: Refer to the procedure report that was given to you for any specific questions about what was found during the examination.  If the procedure report does not answer your questions, please call your gastroenterologist to clarify.  If you requested that your care partner not be given the details of your procedure findings, then the procedure report has been included in a sealed envelope for you to review at your convenience later.  YOU SHOULD EXPECT: Some feelings of bloating in the abdomen. Passage of more gas than usual.  Walking can help get rid of the air that was put into your GI tract during the procedure and reduce the bloating. If you had a lower endoscopy (such as a colonoscopy or flexible sigmoidoscopy) you may notice spotting of blood in your stool or on the toilet paper. If you underwent a bowel prep for your procedure, then you may not have a normal bowel movement for a few days.  DIET: Your first meal following the procedure should be a light meal and then it is ok to progress to your normal diet.  A half-sandwich or bowl of soup is an example of a good first meal.  Heavy or fried foods are harder to digest and may make you feel nauseous or bloated.  Likewise meals heavy in dairy and vegetables can cause extra gas to form and this can also increase the bloating.  Drink plenty of fluids but you should avoid alcoholic beverages for 24 hours.  ACTIVITY: Your care partner should take you home directly after the procedure.  You should plan to take it easy, moving slowly for the rest of the day.  You can resume normal activity the day after the procedure however you should NOT DRIVE or use heavy machinery for 24 hours (because of the sedation medicines used during the test).    SYMPTOMS TO REPORT IMMEDIATELY: A  gastroenterologist can be reached at any hour.  During normal business hours, 8:30 AM to 5:00 PM Monday through Friday, call [phone].  After hours and on weekends, please call the GI answering service at [phone] who will take a message and have the physician on call contact you.   Following lower endoscopy (colonoscopy or flexible sigmoidoscopy):  Excessive amounts of blood in the stool  Significant tenderness or worsening of abdominal pains  Swelling of the abdomen that is new, acute  Fever of 100F or higher  FOLLOW UP: If any biopsies were taken you will be contacted by phone or by letter within the next 1-3 weeks.  Call your gastroenterologist if you have not heard about the biopsies in 3 weeks.  Our staff will call the home number listed on your records the next business day following your procedure to check on you and address any questions or concerns that you may have at that time regarding the information given to you following your procedure. This is a courtesy call and so if there is no answer at the home number and we have not heard from you through the emergency physician on call, we will assume that you have returned to your regular daily activities without incident.  SIGNATURES/CONFIDENTIALITY: You and/or your care partner have signed paperwork which will be entered into your electronic medical record.  These signatures attest to the fact that that the information above on your After Visit Summary  has been reviewed and is understood.  Full responsibility of the confidentiality of this discharge information lies with you and/or your care-partner. 

## 2014-04-08 NOTE — Progress Notes (Signed)
Report to PACU, RN, vss, BBS= Clear.  

## 2014-04-08 NOTE — Progress Notes (Signed)
Dr. Deatra Ina in to consult with patient. Verbally the patient was informed repeat colonoscopy in 7 years, report states 3 years. Paged Dr. Deatra Ina after he had left the building, and was asked to correct the report. It was a typographical error and repeat colonoscopy should be 7 years.

## 2014-04-11 ENCOUNTER — Telehealth: Payer: Self-pay | Admitting: *Deleted

## 2014-04-11 NOTE — Telephone Encounter (Signed)
  Follow up Call-  Call back number 04/08/2014  Post procedure Call Back phone  # [phone]  Permission to leave phone message No  comments no voice mail    Spoke with wife, who states "Who is calling this early in the morning?" I explained who I was and why I was calling Will not let me talk to Mr. Austin Cobb but states he is "fine."

## 2014-04-21 ENCOUNTER — Ambulatory Visit (INDEPENDENT_AMBULATORY_CARE_PROVIDER_SITE_OTHER): Payer: Commercial Managed Care - HMO | Admitting: Family Medicine

## 2014-04-21 VITALS — BP 150/100 | HR 102 | Temp 98.2°F | Resp 16 | Ht 71.0 in | Wt 149.0 lb

## 2014-04-21 DIAGNOSIS — R109 Unspecified abdominal pain: Secondary | ICD-10-CM | POA: Diagnosis not present

## 2014-04-21 DIAGNOSIS — R1030 Lower abdominal pain, unspecified: Secondary | ICD-10-CM

## 2014-04-21 DIAGNOSIS — F99 Mental disorder, not otherwise specified: Secondary | ICD-10-CM

## 2014-04-21 LAB — COMPREHENSIVE METABOLIC PANEL
ALBUMIN: 4.5 g/dL (ref 3.5–5.2)
ALT: 32 U/L (ref 0–53)
AST: 55 U/L — ABNORMAL HIGH (ref 0–37)
Alkaline Phosphatase: 92 U/L (ref 39–117)
BILIRUBIN TOTAL: 1 mg/dL (ref 0.2–1.2)
BUN: 10 mg/dL (ref 6–23)
CHLORIDE: 104 meq/L (ref 96–112)
CO2: 26 meq/L (ref 19–32)
CREATININE: 0.91 mg/dL (ref 0.50–1.35)
Calcium: 8.7 mg/dL (ref 8.4–10.5)
Glucose, Bld: 92 mg/dL (ref 70–99)
Potassium: 4.4 mEq/L (ref 3.5–5.3)
Sodium: 141 mEq/L (ref 135–145)
Total Protein: 7.2 g/dL (ref 6.0–8.3)

## 2014-04-21 LAB — CBC
HCT: 42.2 % (ref 39.0–52.0)
Hemoglobin: 14.2 g/dL (ref 13.0–17.0)
MCH: 29.3 pg (ref 26.0–34.0)
MCHC: 33.6 g/dL (ref 30.0–36.0)
MCV: 87 fL (ref 78.0–100.0)
MPV: 9 fL (ref 8.6–12.4)
PLATELETS: 177 10*3/uL (ref 150–400)
RBC: 4.85 MIL/uL (ref 4.22–5.81)
RDW: 15.2 % (ref 11.5–15.5)
WBC: 8 10*3/uL (ref 4.0–10.5)

## 2014-04-21 NOTE — Progress Notes (Addendum)
[facility] [address], [place] [address] [phone]  Date:  04/21/2014   Name:  Austin Cobb   DOB:  10-10-1960   MRN:  [id number]  PCP:  Ruben Reason, MD    Chief Complaint: Prostate Check   History of Present Illness:  Austin Cobb is a 54 y.o. very pleasant male patient who presents with the following:  History of mental illness and per chart a long history of rectal complaints.  States that he had a colonoscopy last week; "because this did not show the problem" he thinks that he must have prostate cancer.  He notes a problem around his scrotum and perineum which is long standing.  He is not able to really describe the issue but states that he can just tell something is not right.  He would like me to do an x-ray or otherwise determine if he has prostate cancer.     He reports that he is able to sleep well and denies racing thoughts.   He left clinic for a time during our visit- was in the lobby talking to other people there and then went out to the parking lot to smoke for a while. Came back inside to have his blood drawn, stated that "I don't have time to burn up around here, my wife is 78 years old and takes 7 medications.  I never know when I will need to call an ambulance for her." Patient Active Problem List   Diagnosis Date Noted  . Serrated adenoma of colon 05/13/2012  . Anal fissure 05/13/2012  . Tobacco abuse 05/13/2012  . 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 Chronic shoulder pain 01/03/2012  . Neck pain, chronic 01/03/2012  . Mood disorder 01/03/2012  . Depression with anxiety 01/03/2012    Past Medical History  Diagnosis Date  . Arthritis   . Anxiety   . GERD (gastroesophageal reflux disease)   . Substance abuse     6yrs ago smoke crack and marjuana  . Bipolar 1 disorder   . Manic disorder   . Tobacco abuse 05/13/2012  . Depression History of Bipolar    not on meds/pt. stopped meds on own over 12 yrs ago  . History of  colon polyps     Past Surgical History  Procedure Laterality Date  . Eye surgery      bilateral eye sx as a child [age]yrs old.  . Tonsillectomy    . Sphincterotomy N/A 11/12/2012    Procedure: SPHINCTEROTOMY;  Surgeon: Adin Hector, MD;  Location: [facility];  Service: General;  Laterality: N/A;  . Examination under anesthesia N/A 11/12/2012    Procedure: Jasmine December UNDER ANESTHESIA;  Surgeon: Adin Hector, MD;  Location: [facility];  Service: General;  Laterality: N/A;    History  Substance Use Topics  . Smoking status: Current Every Day Smoker -- 1.00 packs/day for 35 years    Types: Cigarettes  . Smokeless tobacco: Never Used  . Alcohol Use: 2.4 oz/week    4 Cans of beer per week    Family History  Problem Relation Age of Onset  . Colon polyps Father   . Diabetes Father   . Diabetes Brother     No Known Allergies  Medication list has been reviewed and updated.  Current Outpatient Prescriptions on File Prior to Visit  Medication Sig Dispense Refill  . aspirin 81 MG tablet Take 81 mg by mouth 2 (two) times daily.     Marland Kitchen  Diphenhydramine-APAP, sleep, (TYLENOL PM EXTRA STRENGTH PO) Take by mouth. Take 2 at bedtime     No current facility-administered medications on file prior to visit.    Review of Systems:  As per HPI- otherwise negative.   Physical Examination: Filed Vitals:   04/21/14 1358  BP: 150/100  Pulse: 114  Temp: 98.2 F (36.8 C)  Resp: 16   Filed Vitals:   04/21/14 1358  Height: 5\' 11"  (1.803 m)  Weight: 149 lb (67.586 kg)   Body mass index is 20.79 kg/(m^2). Ideal Body Weight: Weight in (lb) to have BMI = 25: 178.9  GEN: WDWN, NAD, Non-toxic, A & O x 3, patient sitting in room with gown open to front and genitals exposed, removed cover that I placed over him several times Tobacco odor HEENT: Atraumatic, Normocephalic. Neck supple. No masses, No LAD. Ears and Nose: No external deformity. CV: RRR, No M/G/R. No JVD. No thrill. No extra heart  sounds. PULM: CTA B, no wheezes, crackles, rhonchi. No retractions. No resp. distress. No accessory muscle use. ABD: S, NT, ND. No rebound. No HSM. EXTR: No c/c/e NEURO Normal gait.  PSYCH: is not psychotic but is clearly hypomanic. Pressured speech, tangential speech, rapid speech.  Unusual behavior as described in HPI GU: no abnormality noted of genitals- scrotum, testes or penis.  No apparent tenderness, no masses or redness.  No penile discharge. Perineum unremarkable.  Prostate and rectal exam unremarkable   Wt Readings from Last 3 Encounters:  04/21/14 149 lb (67.586 kg)  04/08/14 151 lb (68.493 kg)  02/15/14 151 lb (68.493 kg)   Noted elevated BP on several occasions in chart.  However it is difficult to judge his true BP given his mania.    Assessment and Plan: Groin pain, unspecified laterality - Plan: PSA, CBC, Comprehensive metabolic panel  Chronic mental illness  Austin Cobb is here today with a non- specific complaint of genital/ rectal pain that has been present for at least 2 years per chart review.  Explained that I cannot tell him today if he has prostate cancer- we will await his PSA and then plan to refer him to urology if necessary.  Explained that I cannot predict his exact treatment plan if he does turn out to have prostate cancer.  He agreed to wait until I get his results and we will talk then.   His care is complicated by hypomania.  He has been off of medications for some time but currently does not feel that he has any problems or need medications   Signed Lamar Blinks, MD  addnd 2/12 CMA Latoria called pt today and let him know that his labs look good, prostate cancer is unlikely.  We will refer to urology for evaluation of his chronic groin pain.  He states understanding.    Placed referral today- JC

## 2014-04-22 ENCOUNTER — Telehealth: Payer: Self-pay | Admitting: Family Medicine

## 2014-04-22 LAB — PSA: PSA: 1.1 ng/mL (ref <=4.00)

## 2014-04-22 NOTE — Addendum Note (Signed)
Addended by: Lamar Blinks C on: 04/22/2014 02:36 PM   Modules accepted: Orders

## 2014-04-22 NOTE — Telephone Encounter (Signed)
Patient called and asked to speak with someone from our "nurse advisory board". Told patient I can take message and send to clinical staff. He has several issues including that he believes he has prostate cancer. Patient went on about past doctor's visits were they have used their fingers to determine if he has colon or prostate cancer. Patient states that he still having sensations in his groin and left buttocks area like a "gnawing feeling". Please call patient at [phone]. Patient states that if he is not at home when we call we can leave his wife a message.

## 2014-04-22 NOTE — Telephone Encounter (Signed)
Should this go to Dr. Linna Darner?

## 2014-04-23 NOTE — Telephone Encounter (Signed)
Dr. Lorelei Pont, please advise. Thanks

## 2014-04-23 NOTE — Telephone Encounter (Signed)
Patient states that last night he and his brother discussed his symptoms and conducted their own investigation through research on the Internet. States that his entire family has health issues. Patient believes he has cancer. Wants to know results of blood test because he states that he may need to purchase a $100,000 life insurance policy for his wife and dog. Informed clinical TL and Dr. Lorelei Pont that patient is very aggressive and yells at phones staff. They have asked that if patient calls to find clinical TL so they can speak with him.   [phone]

## 2014-04-23 NOTE — Telephone Encounter (Signed)
Patient is upset b/c he is not speaking to a medically trained person to answer his question regarding if he has cancer or an infection. Forwarded call to [employer].

## 2014-04-23 NOTE — Telephone Encounter (Signed)
Called him back and reassured that we do not think he has prostate CA based on his PSA test.  I will refer him to urology for further evaluation of his chronic genital/ groin pain.  He states understanding.  Asked him to let us know if he does not hear about this appt in the next few days

## 2014-05-17 ENCOUNTER — Telehealth: Payer: Self-pay

## 2014-05-17 ENCOUNTER — Telehealth: Payer: Self-pay | Admitting: Gastroenterology

## 2014-05-17 NOTE — Telephone Encounter (Signed)
Assessment and Plan: Groin pain, unspecified laterality - Plan: PSA, CBC, Comprehensive metabolic panel  Chronic mental illness  Gloria is here today with a non- specific complaint of genital/ rectal pain that has been present for at least 2 years per chart review. Explained that I cannot tell him today if he has prostate cancer- we will await his PSA and then plan to refer him to urology if necessary. Explained that I cannot predict his exact treatment plan if he does turn out to have prostate cancer. He agreed to wait until I get his results and we will talk then.   His care is complicated by hypomania. He has been off of medications for some time but currently does not feel that he has any problems or need medications   Signed Lamar Blinks, MD  addnd 2/12 CMA Latoria called pt today and let him know that his labs look good, prostate cancer is unlikely. We will refer to urology for evaluation of his chronic groin pain. He states understanding.   Placed referral today- JC  Dr. Lorelei Pont please advise. I feel he should keep his appt wwith the urologist.

## 2014-05-17 NOTE — Telephone Encounter (Signed)
Please give him a call back.  He is always welcome to come and see [place] but so far we have not been able to find a reason for his pain, which is indeed why we referred him to a specialist.  I agree that he should keep his follow-up appt.

## 2014-05-17 NOTE — Telephone Encounter (Signed)
Patient states he is still having issues with his scrotum. Per patient all his test came back clearing him of prostate cancer but he seems to think he may have an infection. He wants to know if he could just come back here and get more antibiotics instead of going to [facility]. He stated he does not have transportation and has to pay a cab and his cost to go to a specialist is high. Per patient he feels he has the same condition is brother had which was prostate infection. He feels he may need to also start taking pain medications. I informed patient our office was referring him to a specialist to further evaluate him more than our office can. Patient still request someone from our office to call him at [phone]

## 2014-05-17 NOTE — Telephone Encounter (Signed)
[facility] called and states pt refused referral.

## 2014-05-18 NOTE — Telephone Encounter (Signed)
Spoke with pt, advised message from Dr. Lorelei Pont. He states he would like Dr Linna Darner or Dr. Carlota Raspberry to review because he feels Dr. Lorelei Pont was up to the job. The patient feels he should be treated for infection and doesn't feel this is Actuary. His brother had the same thing and he feels he is spending money to see a urologist for nothing. He does not want to come in, he wants everyone to put their head together and come up with a solution. He states he has no money for this  Due to having to pay for a cab, copays, and specialist visits. Please advise, I spent 10 minutes on the phone with pt. He keeps saying he does not think this is too difficult.

## 2014-05-18 NOTE — Telephone Encounter (Signed)
Call patient back:  I have reviewed the reports and do not feel like I am going to be able to contribute anything more on this. He is arty been assessed. He is free to come back for another evaluation here, but it will probably end up that he has to pay a co-pay year and then again at the urologist office so he might as well go on straight to the urologist office for the evaluation that he seems to need.

## 2014-05-18 NOTE — Telephone Encounter (Signed)
Advised we are unable to determine if he has any issue with his prostate. Encouraged him to pursue seeing urology if he has concerns.

## 2014-05-18 NOTE — Telephone Encounter (Signed)
Pt calling again about questions about prostate being looked at, at the time of his colonoscopy,  Very persistant and does not want to have to see a Urologist.  (trying to cut the middle man)

## 2014-05-19 NOTE — Telephone Encounter (Signed)
Spoke with pt, advised message from Dr. Linna Darner. Pt agreed to call the urologist office and make appt.

## 2014-06-21 DIAGNOSIS — N500 Atrophy of testis: Secondary | ICD-10-CM | POA: Diagnosis not present

## 2014-06-21 DIAGNOSIS — N419 Inflammatory disease of prostate, unspecified: Secondary | ICD-10-CM | POA: Diagnosis not present

## 2014-06-23 ENCOUNTER — Encounter: Payer: Self-pay | Admitting: Family Medicine

## 2014-06-23 DIAGNOSIS — N419 Inflammatory disease of prostate, unspecified: Secondary | ICD-10-CM | POA: Insufficient documentation

## 2014-07-21 DIAGNOSIS — Z0100 Encounter for examination of eyes and vision without abnormal findings: Secondary | ICD-10-CM | POA: Diagnosis not present

## 2014-12-16 ENCOUNTER — Telehealth: Payer: Self-pay

## 2014-12-16 NOTE — Telephone Encounter (Signed)
Pt called wanting advise on his neck issues. I let him know he would need to be seen. He still would like a CB. Please advise at [phone]

## 2016-05-12 ENCOUNTER — Emergency Department (HOSPITAL_COMMUNITY)
Admission: EM | Admit: 2016-05-12 | Discharge: 2016-05-12 | Disposition: A | Discharge status: Home / Self Care | Payer: Medicare HMO | Attending: Emergency Medicine | Admitting: Emergency Medicine

## 2016-05-12 ENCOUNTER — Encounter (HOSPITAL_COMMUNITY): Payer: Self-pay | Admitting: *Deleted

## 2016-05-12 ENCOUNTER — Emergency Department (HOSPITAL_COMMUNITY): Payer: Medicare HMO

## 2016-05-12 DIAGNOSIS — N3000 Acute cystitis without hematuria: Secondary | ICD-10-CM | POA: Insufficient documentation

## 2016-05-12 DIAGNOSIS — I10 Essential (primary) hypertension: Secondary | ICD-10-CM

## 2016-05-12 DIAGNOSIS — F17210 Nicotine dependence, cigarettes, uncomplicated: Secondary | ICD-10-CM | POA: Insufficient documentation

## 2016-05-12 DIAGNOSIS — R300 Dysuria: Secondary | ICD-10-CM | POA: Diagnosis present

## 2016-05-12 DIAGNOSIS — Z7982 Long term (current) use of aspirin: Secondary | ICD-10-CM | POA: Insufficient documentation

## 2016-05-12 DIAGNOSIS — Z79899 Other long term (current) drug therapy: Secondary | ICD-10-CM | POA: Diagnosis not present

## 2016-05-12 DIAGNOSIS — B9689 Other specified bacterial agents as the cause of diseases classified elsewhere: Secondary | ICD-10-CM | POA: Diagnosis not present

## 2016-05-12 DIAGNOSIS — R079 Chest pain, unspecified: Secondary | ICD-10-CM | POA: Insufficient documentation

## 2016-05-12 LAB — URINALYSIS, ROUTINE W REFLEX MICROSCOPIC
BILIRUBIN URINE: NEGATIVE
Bacteria, UA: NONE SEEN
GLUCOSE, UA: NEGATIVE mg/dL
KETONES UR: NEGATIVE mg/dL
Nitrite: NEGATIVE
PH: 6 (ref 5.0–8.0)
Protein, ur: 100 mg/dL — AB
SPECIFIC GRAVITY, URINE: 1.011 (ref 1.005–1.030)
Squamous Epithelial / LPF: NONE SEEN

## 2016-05-12 LAB — I-STAT CHEM 8, ED
BUN: 10 mg/dL (ref 6–20)
CHLORIDE: 100 mmol/L — AB (ref 101–111)
Calcium, Ion: 1.01 mmol/L — ABNORMAL LOW (ref 1.15–1.40)
Creatinine, Ser: 1.1 mg/dL (ref 0.61–1.24)
Glucose, Bld: 96 mg/dL (ref 65–99)
HEMATOCRIT: 45 % (ref 39.0–52.0)
Hemoglobin: 15.3 g/dL (ref 13.0–17.0)
Potassium: 4.2 mmol/L (ref 3.5–5.1)
SODIUM: 140 mmol/L (ref 135–145)
TCO2: 27 mmol/L (ref 0–100)

## 2016-05-12 LAB — COMPREHENSIVE METABOLIC PANEL
ALT: 17 U/L (ref 17–63)
ANION GAP: 12 (ref 5–15)
AST: 33 U/L (ref 15–41)
Albumin: 4.1 g/dL (ref 3.5–5.0)
Alkaline Phosphatase: 93 U/L (ref 38–126)
BUN: 9 mg/dL (ref 6–20)
CHLORIDE: 100 mmol/L — AB (ref 101–111)
CO2: 26 mmol/L (ref 22–32)
Calcium: 8.9 mg/dL (ref 8.9–10.3)
Creatinine, Ser: 0.96 mg/dL (ref 0.61–1.24)
GFR calc non Af Amer: >60 mL/min (ref >60)
Glucose, Bld: 99 mg/dL (ref 65–99)
POTASSIUM: 4.1 mmol/L (ref 3.5–5.1)
SODIUM: 138 mmol/L (ref 135–145)
Total Bilirubin: 0.7 mg/dL (ref 0.3–1.2)
Total Protein: 7.9 g/dL (ref 6.5–8.1)

## 2016-05-12 LAB — CBC WITH DIFFERENTIAL/PLATELET
BASOS ABS: 0.2 10*3/uL — AB (ref 0.0–0.1)
Basophils Relative: 3 %
Eosinophils Absolute: 0.3 10*3/uL (ref 0.0–0.7)
Eosinophils Relative: 4 %
HCT: 41.4 % (ref 39.0–52.0)
Hemoglobin: 14.6 g/dL (ref 13.0–17.0)
LYMPHS ABS: 2.4 10*3/uL (ref 0.7–4.0)
Lymphocytes Relative: 32 %
MCH: 30.9 pg (ref 26.0–34.0)
MCHC: 35.3 g/dL (ref 30.0–36.0)
MCV: 87.5 fL (ref 78.0–100.0)
MONOS PCT: 6 %
Monocytes Absolute: 0.4 10*3/uL (ref 0.1–1.0)
Neutro Abs: 4.1 10*3/uL (ref 1.7–7.7)
Neutrophils Relative %: 55 %
PLATELETS: 190 10*3/uL (ref 150–400)
RBC: 4.73 MIL/uL (ref 4.22–5.81)
RDW: 13.9 % (ref 11.5–15.5)
WBC: 7.4 10*3/uL (ref 4.0–10.5)

## 2016-05-12 LAB — I-STAT TROPONIN, ED
Troponin i, poc: 0 ng/mL (ref 0.00–0.08)
Troponin i, poc: 0 ng/mL (ref 0.00–0.08)

## 2016-05-12 LAB — D-DIMER, QUANTITATIVE (NOT AT ARMC): D DIMER QUANT: 1.33 ug{FEU}/mL — AB (ref 0.00–0.50)

## 2016-05-12 LAB — RAPID URINE DRUG SCREEN, HOSP PERFORMED
Amphetamines: NOT DETECTED
BARBITURATES: NOT DETECTED
BENZODIAZEPINES: NOT DETECTED
Cocaine: NOT DETECTED
Opiates: NOT DETECTED
Tetrahydrocannabinol: NOT DETECTED

## 2016-05-12 LAB — LIPASE, BLOOD: Lipase: 35 U/L (ref 11–51)

## 2016-05-12 MED ORDER — NICOTINE 21 MG/24HR TD PT24
21.0000 mg | MEDICATED_PATCH | Freq: Once | TRANSDERMAL | Status: DC
  Administered 2016-05-12: 21 mg via TRANSDERMAL
  Filled 2016-05-12: qty 1

## 2016-05-12 MED ORDER — DEXTROSE 5 % IV SOLN
1.0000 g | Freq: Once | INTRAVENOUS | Status: AC
  Administered 2016-05-12: 1 g via INTRAVENOUS
  Filled 2016-05-12: qty 10

## 2016-05-12 MED ORDER — LORAZEPAM 2 MG/ML IJ SOLN
2.0000 mg | Freq: Once | INTRAMUSCULAR | Status: AC
  Administered 2016-05-12: 2 mg via INTRAVENOUS
  Filled 2016-05-12: qty 1

## 2016-05-12 MED ORDER — IOPAMIDOL (ISOVUE-370) INJECTION 76%
INTRAVENOUS | Status: AC
  Administered 2016-05-12: 100 mL
  Filled 2016-05-12: qty 100

## 2016-05-12 MED ORDER — CEPHALEXIN 500 MG PO CAPS: 500.0000 mg | ORAL_CAPSULE | Freq: Three times a day (TID) | ORAL | 0 refills | Status: AC

## 2016-05-12 MED ORDER — LORAZEPAM 2 MG/ML IJ SOLN
1.0000 mg | Freq: Once | INTRAMUSCULAR | Status: AC
  Administered 2016-05-12: 1 mg via INTRAVENOUS
  Filled 2016-05-12: qty 1

## 2016-05-12 MED ORDER — HYDROXYZINE HCL 50 MG PO TABS: 25.0000 mg | ORAL_TABLET | Freq: Three times a day (TID) | ORAL | 0 refills | Status: DC | PRN

## 2016-05-12 NOTE — Discharge Instructions (Signed)
-   You did have a small nodule on your chest CT. Follow-up with your doctor for repeat exam and CT in 6-12 months.

## 2016-05-12 NOTE — ED Triage Notes (Signed)
Pt arrived by [facility] for episode of chest pain that lasted 3 sec. Received Asa pta. Pt pulled iv out on arrival.

## 2016-05-12 NOTE — ED Provider Notes (Signed)
[facility] DEPT Provider Note   CSN: [id number] Arrival date & time: 05/12/16  [id number]     History   Chief Complaint Chief Complaint  Patient presents with  . Chest Pain    HPI Austin Cobb is a 56 y.o. male.  HPI 56 year old male with past medical history of bipolar disorder and polysubstance abuse who presents with chest pain. Patient states that he has been under significant amount of increased stress recently due to his wife's sickness. Earlier this morning, at approximately 4:00, the patient developed acute onset of sharp, transient, bilateral chest pain. This resolved over 4-5 seconds and has not returned. He denies any preceding symptoms. He currently feels back to his baseline. He does note that he likely has a urine infection which is common for him. Denies any flank pain, nausea, vomiting, or fever. Denies any history of blood clots. He denies any recent drug use. No specific alleviating or remitting factors and the symptoms came and resolved while he was sitting down.   Past Medical History:  Diagnosis Date  . Anxiety   . Arthritis   . Bipolar 1 disorder ([place])   . Depression History of Bipolar   not on meds/pt. stopped meds on own over 12 yrs ago  . GERD (gastroesophageal reflux disease)   . History of colon polyps   . Manic disorder ([place])   . Substance abuse    61yrs ago smoke crack and marjuana  . Tobacco abuse 05/13/2012    Patient Active Problem List   Diagnosis Date Noted  . Prostatitis 06/23/2014  . Serrated adenoma of colon 05/13/2012  . Anal fissure 05/13/2012  . Tobacco abuse 05/13/2012  . 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 Chronic shoulder pain 01/03/2012  . Neck pain, chronic 01/03/2012  . Mood disorder ([place]) 01/03/2012  . Depression with anxiety 01/03/2012    Past Surgical History:  Procedure Laterality Date  . EXAMINATION UNDER ANESTHESIA N/A 11/12/2012   Procedure: EXAM UNDER ANESTHESIA;  Surgeon: Adin Hector, MD;  Location: [facility];   Service: General;  Laterality: N/A;  . EYE SURGERY     bilateral eye sx as a child [age]yrs old.  Joan Mayans N/A 11/12/2012   Procedure: Joan Mayans;  Surgeon: Adin Hector, MD;  Location: [facility];  Service: General;  Laterality: N/A;  . TONSILLECTOMY         Home Medications    Prior to Admission medications   Medication Sig Start Date End Date Taking? Authorizing Provider  aspirin 81 MG tablet Take 81 mg by mouth 2 (two) times daily.    Yes Historical Provider, MD  Diphenhydramine-APAP, sleep, (TYLENOL PM EXTRA STRENGTH PO) Take by mouth. Take 2 at bedtime   Yes Historical Provider, MD  cephALEXin (KEFLEX) 500 MG capsule Take 1 capsule (500 mg total) by mouth 3 (three) times daily. 05/12/16 05/19/16  Duffy Bruce, MD  hydrOXYzine (ATARAX/VISTARIL) 50 MG tablet Take 0.5 tablets (25 mg total) by mouth every 8 (eight) hours as needed for anxiety (sleep). 05/12/16   Duffy Bruce, MD    Family History Family History  Problem Relation Age of Onset  . Colon polyps Father   . Diabetes Father   . Diabetes Brother     Social History Social History  Substance Use Topics  . Smoking status: Current Every Day Smoker    Packs/day: 1.00    Years: 35.00    Types: Cigarettes  . Smokeless tobacco: Never Used  . Alcohol use 2.4 oz/week  4 Cans of beer per week     Allergies   Patient has no known allergies.   Review of Systems Review of Systems  Constitutional: Positive for fatigue. Negative for chills and fever.  HENT: Negative for congestion and rhinorrhea.   Eyes: Negative for visual disturbance.  Respiratory: Negative for cough, shortness of breath and wheezing.   Cardiovascular: Positive for chest pain (Transient, resolved). Negative for leg swelling.  Gastrointestinal: Negative for abdominal pain, diarrhea, nausea and vomiting.  Genitourinary: Positive for dysuria. Negative for flank pain, scrotal swelling, testicular pain and urgency.  Musculoskeletal: Negative for  neck pain and neck stiffness.  Skin: Negative for rash and wound.  Allergic/Immunologic: Negative for immunocompromised state.  Neurological: Negative for syncope, weakness and headaches.  All other systems reviewed and are negative.    Physical Exam Updated Vital Signs BP 155/98   Pulse 71   Resp 18   SpO2 98%   Physical Exam  Constitutional: He is oriented to person, place, and time. He appears well-developed and well-nourished. No distress.  HENT:  Head: Normocephalic and atraumatic.  Eyes: Conjunctivae are normal.  Neck: Neck supple.  Cardiovascular: Normal rate, regular rhythm and normal heart sounds.  Exam reveals no friction rub.   No murmur heard. Pulmonary/Chest: Effort normal and breath sounds normal. No respiratory distress. He has no wheezes. He has no rales.  Abdominal: He exhibits no distension.  Musculoskeletal: He exhibits no edema.  Neurological: He is alert and oriented to person, place, and time. He exhibits normal muscle tone.  Skin: Skin is warm. Capillary refill takes less than 2 seconds.  Psychiatric: He has a normal mood and affect.  Nursing note and vitals reviewed.    ED Treatments / Results  Labs (all labs ordered are listed, but only abnormal results are displayed) Labs Reviewed  CBC WITH DIFFERENTIAL/PLATELET - Abnormal; Notable for the following:       Result Value   Basophils Absolute 0.2 (*)    All other components within normal limits  COMPREHENSIVE METABOLIC PANEL - Abnormal; Notable for the following:    Chloride 100 (*)    All other components within normal limits  URINALYSIS, ROUTINE W REFLEX MICROSCOPIC - Abnormal; Notable for the following:    APPearance CLOUDY (*)    Hgb urine dipstick SMALL (*)    Protein, ur 100 (*)    Leukocytes, UA LARGE (*)    All other components within normal limits  D-DIMER, QUANTITATIVE (NOT AT [facility]) - Abnormal; Notable for the following:    D-Dimer, Quant 1.33 (*)    All other components within  normal limits  I-STAT CHEM 8, ED - Abnormal; Notable for the following:    Chloride 100 (*)    Calcium, Ion 1.01 (*)    All other components within normal limits  URINE CULTURE  RAPID URINE DRUG SCREEN, HOSP PERFORMED  LIPASE, BLOOD  I-STAT TROPOININ, ED  I-STAT TROPOININ, ED    EKG  EKG Interpretation  Date/Time:  Sunday May 12 2016 07:53:39 EST Ventricular Rate:  74 PR Interval:    QRS Duration: 98 QT Interval:  352 QTC Calculation: 391 R Axis:   64 Text Interpretation:  Sinus rhythm Probable anteroseptal infarct, old LVH with repolarization abnormalities No significant change since last tracing Confirmed by Kelten Enochs MD, Hayzlee Mcsorley [phone]) on 05/12/2016 11:41:05 AM       Radiology Dg Chest 2 View  Result Date: 05/12/2016 CLINICAL DATA:  Patient reports recent episode of chest pain that lasted 3  seconds. EXAM: CHEST  2 VIEW COMPARISON:  None. FINDINGS: Heart is mildly enlarged. There are no focal consolidations or pleural effusions. No pulmonary edema. IMPRESSION: Mild cardiomegaly. Electronically Signed   By: Nolon Nations M.D.   On: 05/12/2016 10:13   Ct Angio Chest Pe W Or Wo Contrast  Result Date: 05/12/2016 CLINICAL DATA:  Chest pain EXAM: CT ANGIOGRAPHY CHEST WITH CONTRAST TECHNIQUE: Multidetector CT imaging of the chest was performed using the standard protocol during bolus administration of intravenous contrast. Multiplanar CT image reconstructions and MIPs were obtained to evaluate the vascular anatomy. CONTRAST:  80 cc Isovue 370 COMPARISON:  Chest x-ray from earlier same day. FINDINGS: Cardiovascular: There is no pulmonary embolism identified within the main, lobar or segmental pulmonary arteries bilaterally. Mild aortic atherosclerosis. No aortic aneurysm or evidence of aortic dissection. Heart size is upper normal.  No pericardial effusion. Mediastinum/Nodes: No mass or enlarged lymph nodes seen within the mediastinum, perihilar or axillary regions. Esophagus is  unremarkable. Trachea and central bronchi appear normal. Lungs/Pleura: 7 mm pulmonary nodule within the left upper lobe (series 6, image 48). Lungs otherwise clear. No pleural effusion or pneumothorax. Upper Abdomen: Limited images of the upper abdomen are unremarkable. Musculoskeletal: Mild degenerative spurring throughout the thoracic spine. No acute or suspicious osseous finding. Superficial soft tissues are unremarkable. Review of the MIP images confirms the above findings. IMPRESSION: 1. No acute findings.  No pulmonary embolism. 2. 7 mm pulmonary nodule within the left upper lobe. Non-contrast chest CT at 6-12 months is recommended. If the nodule is stable at time of repeat CT, then future CT at 18-24 months (from today's scan) is considered optional for low-risk patients, but is recommended for high-risk patients. This recommendation follows the consensus statement: Guidelines for Management of Incidental Pulmonary Nodules Detected on CT Images: From the Fleischner Society 2017; Radiology 2017; 284:228-243. 3. Lungs otherwise clear.  No pneumonia or pulmonary edema. 4. Aortic atherosclerosis. Electronically Signed   By: Franki Cabot M.D.   On: 05/12/2016 14:04    Procedures Procedures (including critical care time)  Medications Ordered in ED Medications  nicotine (NICODERM CQ - dosed in mg/24 hours) patch 21 mg (21 mg Transdermal Patch Applied 05/12/16 1309)  LORazepam (ATIVAN) injection 2 mg (2 mg Intravenous Given 05/12/16 0815)  cefTRIAXone (ROCEPHIN) 1 g in dextrose 5 % 50 mL IVPB (0 g Intravenous Stopped 05/12/16 1250)  iopamidol (ISOVUE-370) 76 % injection (100 mLs  Contrast Given 05/12/16 1313)  LORazepam (ATIVAN) injection 1 mg (1 mg Intravenous Given 05/12/16 1307)     Initial Impression / Assessment and Plan / ED Course  I have reviewed the triage vital signs and the nursing notes.  Pertinent labs & imaging results that were available during my care of the patient were reviewed by me and  considered in my medical decision making (see chart for details).     56 year old male with history of bipolar disorder, hypertension, who presents with transient, less than 5 second episode of chest pain. He is hypertensive on arrival and admits to being under significantly increased stress due to his wife's recent illness. He is goal-directed, however, with no suicidal ideation, homicidal ideation, or evidence of overt mania. I do not feel he merits psychiatric evaluation and he states he follows up with a psychiatrist regularly. Otherwise, suspect his pain is likely musculoskeletal versus GI related. He does incidentally have a UTI but no evidence of pyelo.Will treat for this. Otherwise, delta troponin is negative and he has a low  risk heart score and I do not suspect ACS. D-dimer is positive is well but CT angiogram obtained and is negative. Doubt PE or dissection. UDS is negative for cocaine. Patient's pain is completely resolved with Ativan and he is very well-appearing on repeat exam. Will discharge with instructions to follow up with his doctor regarding his UTI as well as his hypertension. Return precautions were given in detail.  Final Clinical Impressions(s) / ED Diagnoses   Final diagnoses:  Chest pain  Acute cystitis without hematuria  Essential hypertension    New Prescriptions Discharge Medication List as of 05/12/2016  2:20 PM    START taking these medications   Details  cephALEXin (KEFLEX) 500 MG capsule Take 1 capsule (500 mg total) by mouth 3 (three) times daily., Starting Sun 05/12/2016, Until Sun 05/19/2016, Print    hydrOXYzine (ATARAX/VISTARIL) 50 MG tablet Take 0.5 tablets (25 mg total) by mouth every 8 (eight) hours as needed for anxiety (sleep)., Starting Sun 05/12/2016, Print         Duffy Bruce, MD 05/12/16 [phone]

## 2016-05-14 LAB — URINE CULTURE

## 2016-05-15 ENCOUNTER — Telehealth: Payer: Self-pay | Admitting: Emergency Medicine

## 2016-05-15 NOTE — Telephone Encounter (Signed)
Post ED Visit - Positive Culture Follow-up  Culture report reviewed by antimicrobial stewardship pharmacist:  []  Elenor Quinones, Pharm.D. [x]  Heide Guile, Pharm.D., BCPS []  Parks Neptune, Pharm.D. []  Alycia Rossetti, Pharm.D., BCPS []  [place], Pharm.D., BCPS, AAHIVP []  Legrand Como, Pharm.D., BCPS, AAHIVP []  Milus Glazier, Pharm.D. []  Stephens November, Pharm.D.  Positive urine culture Treated with cephalexin, organism sensitive to the same and no further patient follow-up is required at this time.  Hazle Nordmann 05/15/2016, 1:27 PM

## 2016-05-19 ENCOUNTER — Telehealth: Payer: Self-pay | Admitting: Family Medicine

## 2016-05-19 NOTE — Telephone Encounter (Signed)
Answering service call.  Total time of call 24 minutes.   Patient calling with concerns of blood pressure and palpitations. Prior patient of Dr. Linna Darner.  He was seen in the ER approximately one week ago after EMS call for chest pain. He was told that his blood pressure was very high by EMS and reportedly 220/120. He was sent home from the emergency room, and was not advised any heart issue at that time. Since ER visit he has had some palpitations that come and go over the past 1 week that occur 3-4 times per day. Denies associated lightheadedness or dizziness. Difficult historian with regards to chest pains during the symptoms as he denied chest pains but then later in conversation states he is taking a full aspirin 3-4 times per day for the sensation in his chest. He is not on any antihypertensives at this time. He denies any current chest pains or current heart palpitations during discussion on phone. It appears his main concern is that he may need to be on other medications to help control the symptoms. He has not checked his blood pressure at home as no monitor.   Phone call difficult due to having to clarify multiple times his specific concerns and reason for call. Also throughout phone call, I was interrupted frequently when trying to clarify his concerns or responses, or to provide information in regards to his symptoms and plan. Recommended ER evaluation, but he did not feel that these were urgent symptoms. Ultimately advised him to be seen in the emergency room if any return of heart palpitations or chest pains tonight.  Also recommended against more than 1 aspirin per day at this time with heartburn symptoms. As long as he is asymptomatic overnight, advised to return to our office first thing in the morning to discuss his symptoms and plan further which may involve blood work, EKG, or cardiology eval. He expressed understanding of this plan and does plan to call our office in the morning to be seen,  and understood overnight ER precautions.

## 2017-04-22 ENCOUNTER — Encounter: Payer: Self-pay | Admitting: Gastroenterology

## 2017-05-22 ENCOUNTER — Ambulatory Visit: Payer: Commercial Managed Care - HMO | Admitting: Family Medicine

## 2017-07-10 ENCOUNTER — Ambulatory Visit: Payer: Self-pay

## 2017-07-10 NOTE — Telephone Encounter (Addendum)
Pt. C/o right shoulder pain started last Saturday when he moved a piece of furniture. States pain hurts in the back at his shoulder blade and comes around front to his breast area. "I think it may be cancer. I can't come in before next week though." Appointment made.  Reason for Disposition . [1] MODERATE pain (e.g., interferes with normal activities) AND [2] present > 3 days  Answer Assessment - Initial Assessment Questions 1. ONSET: "When did the pain start?"     Started Saturday 2. LOCATION: "Where is the pain located?"     Right shoulder blade and comes around to breast area 3. PAIN: "How bad is the pain?" (Scale 1-10; or mild, moderate, severe)   - MILD (1-3): doesn't interfere with normal activities   - MODERATE (4-7): interferes with normal activities (e.g., work or school) or awakens from sleep   - SEVERE (8-10): excruciating pain, unable to do any normal activities, unable to move arm at all due to pain     7-8 4. WORK OR EXERCISE: "Has there been any recent work or exercise that involved this part of the body?"     No 5. CAUSE: "What do you think is causing the shoulder pain?"     "I think it is cancer." 6. OTHER SYMPTOMS: "Do you have any other symptoms?" (e.g., neck pain, swelling, rash, fever, numbness, weakness)     No 7. PREGNANCY: "Is there any chance you are pregnant?" "When was your last menstrual period?"     No  Protocols used: SHOULDER PAIN-A-AH

## 2017-07-16 ENCOUNTER — Ambulatory Visit: Payer: Self-pay | Admitting: Emergency Medicine

## 2017-07-23 ENCOUNTER — Encounter: Payer: Self-pay | Admitting: Emergency Medicine

## 2017-07-23 ENCOUNTER — Other Ambulatory Visit: Payer: Self-pay

## 2017-07-23 ENCOUNTER — Ambulatory Visit: Payer: Medicare HMO | Admitting: Emergency Medicine

## 2017-07-23 VITALS — BP 118/80 | HR 70 | Temp 98.0°F | Resp 16 | Ht 69.0 in | Wt 135.0 lb

## 2017-07-23 DIAGNOSIS — B029 Zoster without complications: Secondary | ICD-10-CM | POA: Diagnosis not present

## 2017-07-23 DIAGNOSIS — Z9181 History of falling: Secondary | ICD-10-CM | POA: Insufficient documentation

## 2017-07-23 MED ORDER — VALACYCLOVIR HCL 1 G PO TABS: 1000.0000 mg | ORAL_TABLET | Freq: Two times a day (BID) | ORAL | 0 refills | Status: DC

## 2017-07-23 MED ORDER — DICLOFENAC SODIUM 75 MG PO TBEC: 75.0000 mg | DELAYED_RELEASE_TABLET | Freq: Two times a day (BID) | ORAL | 0 refills | Status: AC

## 2017-07-23 NOTE — Progress Notes (Signed)
Austin Cobb 57 y.o.   Chief Complaint  Patient presents with  . Herpes Zoster    x 2 weeks - per patient RIGHT side to back  . Fall    more than 2     HISTORY OF PRESENT ILLNESS: This is a 57 y.o. male developed shingles 2 weeks ago complaining of painful rash.  No other significant symptoms.. Shingles  HPI   Prior to Admission medications   Medication Sig Start Date End Date Taking? Authorizing Provider  aspirin 81 MG tablet Take 81 mg by mouth 2 (two) times daily.    Yes [provider]  Diphenhydramine-APAP, sleep, (TYLENOL PM EXTRA STRENGTH PO) Take by mouth. Take 2 at bedtime   Yes [provider]  hydrOXYzine (ATARAX/VISTARIL) 50 MG tablet Take 0.5 tablets (25 mg total) by mouth every 8 (eight) hours as needed for anxiety (sleep). Patient not taking: Reported on 07/23/2017 05/12/16   Duffy Bruce, MD    No Known Allergies  Patient Active Problem List   Diagnosis Date Noted  . Prostatitis 06/23/2014  . Serrated adenoma of colon 05/13/2012  . Anal fissure 05/13/2012  . Tobacco abuse 05/13/2012  . 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 Chronic shoulder pain 01/03/2012  . Neck pain, chronic 01/03/2012  . Mood disorder ([place]) 01/03/2012  . Depression with anxiety 01/03/2012    Past Medical History:  Diagnosis Date  . Anxiety   . Arthritis   . Bipolar 1 disorder ([place])   . Depression History of Bipolar   not on meds/pt. stopped meds on own over 12 yrs ago  . GERD (gastroesophageal reflux disease)   . History of colon polyps   . Manic disorder ([place])   . Substance abuse ([place])    32yrs ago smoke crack and marjuana  . Tobacco abuse 05/13/2012    Past Surgical History:  Procedure Laterality Date  . EXAMINATION UNDER ANESTHESIA N/A 11/12/2012   Procedure: EXAM UNDER ANESTHESIA;  Surgeon: Adin Hector, MD;  Location: [facility];  Service: General;  Laterality: N/A;  . EYE SURGERY     bilateral eye sx as a child [age]yrs old.  Joan Mayans  N/A 11/12/2012   Procedure: Joan Mayans;  Surgeon: Adin Hector, MD;  Location: [facility];  Service: General;  Laterality: N/A;  . TONSILLECTOMY      Social History   Socioeconomic History  . Marital status: Divorced    Spouse name: Not on file  . Number of children: Not on file  . Years of education: Not on file  . Highest education level: Not on file  Occupational History  . Not on file  Social Needs  . Financial resource strain: Not on file  . Food insecurity:    Worry: Not on file    Inability: Not on file  . Transportation needs:    Medical: Not on file    Non-medical: Not on file  Tobacco Use  . Smoking status: Current Every Day Smoker    Packs/day: 1.00    Years: 35.00    Pack years: 35.00    Types: Cigarettes  . Smokeless tobacco: Never Used  Substance and Sexual Activity  . Alcohol use: Yes    Alcohol/week: 2.4 oz    Types: 4 Cans of beer per week  . Drug use: Yes    Types: Cocaine, Marijuana    Comment: Quit x6 yrs ago-   . Sexual activity: Never  Lifestyle  . Physical activity:    Days per  week: Not on file    Minutes per session: Not on file  . Stress: Not on file  Relationships  . Social connections:    Talks on phone: Not on file    Gets together: Not on file    Attends religious service: Not on file    Active member of club or organization: Not on file    Attends meetings of clubs or organizations: Not on file    Relationship status: Not on file  . Intimate partner violence:    Fear of current or ex partner: Not on file    Emotionally abused: Not on file    Physically abused: Not on file    Forced sexual activity: Not on file  Other Topics Concern  . Not on file  Social History Narrative  . Not on file    Family History  Problem Relation Age of Onset  . Colon polyps Father   . Diabetes Father   . Diabetes Brother      Review of Systems  Constitutional: Negative.  Negative for chills and fever.  HENT: Negative.  Negative for sore  throat.   Eyes: Negative.  Negative for discharge and redness.  Respiratory: Negative for cough and shortness of breath.   Cardiovascular: Negative.  Negative for chest pain and palpitations.  Gastrointestinal: Negative.  Negative for abdominal pain, blood in stool, nausea and vomiting.  Genitourinary: Negative.   Musculoskeletal: Negative.  Negative for back pain, myalgias and neck pain.  Skin: Positive for rash.  Neurological: Negative.  Negative for dizziness and headaches.  Endo/Heme/Allergies: Negative.   All other systems reviewed and are negative.   Vitals:   07/23/17 1543  BP: 118/80  Pulse: 70  Resp: 16  Temp: 98 F (36.7 C)  SpO2: 97%    Physical Exam  Constitutional: He is oriented to person, place, and time. He appears well-developed and well-nourished.  HENT:  Head: Normocephalic and atraumatic.  Eyes: Pupils are equal, round, and reactive to light. Conjunctivae and EOM are normal.  Neck: Normal range of motion. Neck supple.  Cardiovascular: Normal rate and regular rhythm.  Pulmonary/Chest: Effort normal and breath sounds normal.  Abdominal: Soft. There is no tenderness.  Neurological: He is alert and oriented to person, place, and time.  Skin: Capillary refill takes less than 2 seconds.  Positive shingles rash to right side T3 dermatome.  Psychiatric: He has a normal mood and affect. His behavior is normal.  Vitals reviewed.  A total of 25 minutes was spent in the room with the patient, greater than 50% of which was in counseling/coordination of care regarding herpes zoster diagnosis, treatment, medications, pain management, prognosis, and need for follow-up.   ASSESSMENT & PLAN: Austin Cobb was seen today for herpes zoster and fall.  Diagnoses and all orders for this visit:  Herpes zoster without complication -     valACYclovir (VALTREX) 1000 MG tablet; Take 1 tablet (1,000 mg total) by mouth 2 (two) times daily. -     diclofenac (VOLTAREN) 75 MG EC tablet;  Take 1 tablet (75 mg total) by mouth 2 (two) times daily for 5 days.    Patient Instructions       IF you received an x-ray today, you will receive an invoice from [facility]. Please contact [facility] at [phone] with questions or concerns regarding your invoice.   IF you received labwork today, you will receive an invoice from [facility]. Please contact [facility] at [phone] with questions or concerns regarding your  invoice.   Our billing staff will not be able to assist you with questions regarding bills from these companies.  You will be contacted with the lab results as soon as they are available. The fastest way to get your results is to activate your My Chart account. Instructions are located on the last page of this paperwork. If you have not heard from [place] regarding the results in 2 weeks, please contact this office.     Shingles Shingles is an infection that causes a painful skin rash and fluid-filled blisters. Shingles is caused by the same virus that causes chickenpox. Shingles only develops in people who:  Have had chickenpox.  Have gotten the chickenpox vaccine. (This is rare.)  The first symptoms of shingles may be itching, tingling, or pain in an area on your skin. A rash will follow in a few days or weeks. The rash is usually on one side of the body in a bandlike or beltlike pattern. Over time, the rash turns into fluid-filled blisters that break open, scab over, and dry up. Medicines may:  Help you manage pain.  Help you recover more quickly.  Help to prevent long-term problems.  Follow these instructions at home: Medicines  Take medicines only as told by your doctor.  Apply an anti-itch or numbing cream to the affected area as told by your doctor. Blister and Rash Care  Take a cool bath or put cool compresses on the area of the rash or blisters as told by your doctor. This may help with pain and itching.  Keep your rash covered  with a loose bandage (dressing). Wear loose-fitting clothing.  Keep your rash and blisters clean with mild soap and cool water or as told by your doctor.  Check your rash every day for signs of infection. These include redness, swelling, and pain that lasts or gets worse.  Do not pick your blisters.  Do not scratch your rash. General instructions  Rest as told by your doctor.  Keep all follow-up visits as told by your doctor. This is important.  Until your blisters scab over, your infection can cause chickenpox in people who have never had it or been vaccinated against it. To prevent this from happening, avoid touching other people or being around other people, especially: ? Babies. ? Pregnant women. ? Children who have eczema. ? Elderly people who have transplants. ? People who have chronic illnesses, such as leukemia or AIDS. Contact a doctor if:  Your pain does not get better with medicine.  Your pain does not get better after the rash heals.  Your rash looks infected. Signs of infection include: ? Redness. ? Swelling. ? Pain that lasts or gets worse. Get help right away if:  The rash is on your face or nose.  You have pain in your face, pain around your eye area, or loss of feeling on one side of your face.  You have ear pain or you have ringing in your ear.  You have loss of taste.  Your condition gets worse. This information is not intended to replace advice given to you by your health care provider. Make sure you discuss any questions you have with your health care provider. Document Released: 08/14/2007 Document Revised: 10/22/2015 Document Reviewed: 12/07/2013 Elsevier Interactive Patient Education  2018 Elsevier Inc.      Agustina Caroli, MD [facility]

## 2017-07-23 NOTE — Patient Instructions (Addendum)
   IF you received an x-ray today, you will receive an invoice from [facility]. Please contact [facility] at [phone] with questions or concerns regarding your invoice.   IF you received labwork today, you will receive an invoice from [facility]. Please contact [facility] at [phone] with questions or concerns regarding your invoice.   Our billing staff will not be able to assist you with questions regarding bills from these companies.  You will be contacted with the lab results as soon as they are available. The fastest way to get your results is to activate your My Chart account. Instructions are located on the last page of this paperwork. If you have not heard from us regarding the results in 2 weeks, please contact this office.     Shingles Shingles is an infection that causes a painful skin rash and fluid-filled blisters. Shingles is caused by the same virus that causes chickenpox. Shingles only develops in people who:  Have had chickenpox.  Have gotten the chickenpox vaccine. (This is rare.)  The first symptoms of shingles may be itching, tingling, or pain in an area on your skin. A rash will follow in a few days or weeks. The rash is usually on one side of the body in a bandlike or beltlike pattern. Over time, the rash turns into fluid-filled blisters that break open, scab over, and dry up. Medicines may:  Help you manage pain.  Help you recover more quickly.  Help to prevent long-term problems.  Follow these instructions at home: Medicines  Take medicines only as told by your doctor.  Apply an anti-itch or numbing cream to the affected area as told by your doctor. Blister and Rash Care  Take a cool bath or put cool compresses on the area of the rash or blisters as told by your doctor. This may help with pain and itching.  Keep your rash covered with a loose bandage (dressing). Wear loose-fitting clothing.  Keep your rash and blisters clean  with mild soap and cool water or as told by your doctor.  Check your rash every day for signs of infection. These include redness, swelling, and pain that lasts or gets worse.  Do not pick your blisters.  Do not scratch your rash. General instructions  Rest as told by your doctor.  Keep all follow-up visits as told by your doctor. This is important.  Until your blisters scab over, your infection can cause chickenpox in people who have never had it or been vaccinated against it. To prevent this from happening, avoid touching other people or being around other people, especially: ? Babies. ? Pregnant women. ? Children who have eczema. ? Elderly people who have transplants. ? People who have chronic illnesses, such as leukemia or AIDS. Contact a doctor if:  Your pain does not get better with medicine.  Your pain does not get better after the rash heals.  Your rash looks infected. Signs of infection include: ? Redness. ? Swelling. ? Pain that lasts or gets worse. Get help right away if:  The rash is on your face or nose.  You have pain in your face, pain around your eye area, or loss of feeling on one side of your face.  You have ear pain or you have ringing in your ear.  You have loss of taste.  Your condition gets worse. This information is not intended to replace advice given to you by your health care provider. Make sure you discuss any questions   you have with your health care provider. Document Released: 08/14/2007 Document Revised: 10/22/2015 Document Reviewed: 12/07/2013 Elsevier Interactive Patient Education  2018 Elsevier Inc.  

## 2017-07-24 ENCOUNTER — Ambulatory Visit: Payer: Self-pay | Admitting: Family Medicine

## 2017-07-24 ENCOUNTER — Ambulatory Visit: Payer: Self-pay | Admitting: Urgent Care

## 2017-07-24 NOTE — Progress Notes (Deleted)
No chief complaint on file.   HPI  4 review of systems  Past Medical History:  Diagnosis Date  . Anxiety   . Arthritis   . Bipolar 1 disorder ([place])   . Depression History of Bipolar   not on meds/pt. stopped meds on own over 12 yrs ago  . GERD (gastroesophageal reflux disease)   . History of colon polyps   . Manic disorder ([place])   . Substance abuse ([place])    51yrs ago smoke crack and marjuana  . Tobacco abuse 05/13/2012    Current Outpatient Medications  Medication Sig Dispense Refill  . aspirin 81 MG tablet Take 81 mg by mouth 2 (two) times daily.     . diclofenac (VOLTAREN) 75 MG EC tablet Take 1 tablet (75 mg total) by mouth 2 (two) times daily for 5 days. 20 tablet 0  . Diphenhydramine-APAP, sleep, (TYLENOL PM EXTRA STRENGTH PO) Take by mouth. Take 2 at bedtime    . hydrOXYzine (ATARAX/VISTARIL) 50 MG tablet Take 0.5 tablets (25 mg total) by mouth every 8 (eight) hours as needed for anxiety (sleep). (Patient not taking: Reported on 07/23/2017) 12 tablet 0  . valACYclovir (VALTREX) 1000 MG tablet Take 1 tablet (1,000 mg total) by mouth 2 (two) times daily. 20 tablet 0   No current facility-administered medications for this visit.     Allergies: No Known Allergies  Past Surgical History:  Procedure Laterality Date  . EXAMINATION UNDER ANESTHESIA N/A 11/12/2012   Procedure: EXAM UNDER ANESTHESIA;  Surgeon: Adin Hector, MD;  Location: [facility];  Service: General;  Laterality: N/A;  . EYE SURGERY     bilateral eye sx as a child 6-60yrs old.  Joan Mayans N/A 11/12/2012   Procedure: Joan Mayans;  Surgeon: Adin Hector, MD;  Location: [facility];  Service: General;  Laterality: N/A;  . TONSILLECTOMY      Social History   Socioeconomic History  . Marital status: Divorced    Spouse name: Not on file  . Number of children: Not on file  . Years of education: Not on file  . Highest education level: Not on file  Occupational History  . Not on file  Social Needs  .  Financial resource strain: Not on file  . Food insecurity:    Worry: Not on file    Inability: Not on file  . Transportation needs:    Medical: Not on file    Non-medical: Not on file  Tobacco Use  . Smoking status: Current Every Day Smoker    Packs/day: 1.00    Years: 35.00    Pack years: 35.00    Types: Cigarettes  . Smokeless tobacco: Never Used  Substance and Sexual Activity  . Alcohol use: Yes    Alcohol/week: 2.4 oz    Types: 4 Cans of beer per week  . Drug use: Yes    Types: Cocaine, Marijuana    Comment: Quit x6 yrs ago-   . Sexual activity: Never  Lifestyle  . Physical activity:    Days per week: Not on file    Minutes per session: Not on file  . Stress: Not on file  Relationships  . Social connections:    Talks on phone: Not on file    Gets together: Not on file    Attends religious service: Not on file    Active member of club or organization: Not on file    Attends meetings of clubs or organizations: Not on file  Relationship status: Not on file  Other Topics Concern  . Not on file  Social History Narrative  . Not on file    Family History  Problem Relation Age of Onset  . Colon polyps Father   . Diabetes Father   . Diabetes Brother      ROS Review of Systems See HPI Constitution: No fevers or chills No malaise No diaphoresis Skin: No rash or itching Eyes: no blurry vision, no double vision GU: no dysuria or hematuria Neuro: no dizziness or headaches * all others reviewed and negative   Objective: There were no vitals filed for this visit.  Physical Exam  Assessment and Plan There are no diagnoses linked to this encounter.   Jaze Rodino P [employer]

## 2017-08-08 ENCOUNTER — Telehealth: Payer: Self-pay | Admitting: Family Medicine

## 2017-08-08 NOTE — Telephone Encounter (Signed)
Copied from [place] [id number]. Topic: Quick Communication - See Telephone Encounter >> Aug 08, 2017  9:07 AM Austin Cobb wrote: CRM for notification. See Telephone encounter for: 08/08/17. Pt called in and wanted to know if the meds for his shingles can be refilled.  He feels like it is is almost gone but would like one more round of the meds.  He seen Austin Cobb Cobb couple of weeks ago .   He has trouble with transportation. He does not drive.   Pharmacy - [facility] on [address] number [phone]

## 2017-08-08 NOTE — Telephone Encounter (Signed)
If patient desires refill on shingles medication, he needs to be seen for an office visit. Please call patient to schedule.

## 2017-08-09 ENCOUNTER — Encounter (HOSPITAL_COMMUNITY): Payer: Self-pay

## 2017-08-09 ENCOUNTER — Emergency Department (HOSPITAL_COMMUNITY)
Admission: EM | Admit: 2017-08-09 | Discharge: 2017-08-09 | Disposition: A | Discharge status: Home / Self Care | Payer: Medicare HMO | Attending: Emergency Medicine | Admitting: Emergency Medicine

## 2017-08-09 ENCOUNTER — Emergency Department (HOSPITAL_COMMUNITY): Payer: Medicare HMO

## 2017-08-09 DIAGNOSIS — F10929 Alcohol use, unspecified with intoxication, unspecified: Secondary | ICD-10-CM | POA: Diagnosis not present

## 2017-08-09 DIAGNOSIS — B029 Zoster without complications: Secondary | ICD-10-CM | POA: Insufficient documentation

## 2017-08-09 DIAGNOSIS — I499 Cardiac arrhythmia, unspecified: Secondary | ICD-10-CM | POA: Diagnosis not present

## 2017-08-09 DIAGNOSIS — D649 Anemia, unspecified: Secondary | ICD-10-CM | POA: Insufficient documentation

## 2017-08-09 DIAGNOSIS — Z79899 Other long term (current) drug therapy: Secondary | ICD-10-CM | POA: Diagnosis not present

## 2017-08-09 DIAGNOSIS — Z7982 Long term (current) use of aspirin: Secondary | ICD-10-CM | POA: Diagnosis not present

## 2017-08-09 DIAGNOSIS — I10 Essential (primary) hypertension: Secondary | ICD-10-CM | POA: Diagnosis not present

## 2017-08-09 DIAGNOSIS — F17210 Nicotine dependence, cigarettes, uncomplicated: Secondary | ICD-10-CM | POA: Diagnosis not present

## 2017-08-09 DIAGNOSIS — F99 Mental disorder, not otherwise specified: Secondary | ICD-10-CM | POA: Diagnosis not present

## 2017-08-09 DIAGNOSIS — R079 Chest pain, unspecified: Secondary | ICD-10-CM | POA: Diagnosis not present

## 2017-08-09 DIAGNOSIS — F29 Unspecified psychosis not due to a substance or known physiological condition: Secondary | ICD-10-CM | POA: Diagnosis not present

## 2017-08-09 LAB — URINALYSIS, ROUTINE W REFLEX MICROSCOPIC
BILIRUBIN URINE: NEGATIVE
GLUCOSE, UA: NEGATIVE mg/dL
Hgb urine dipstick: NEGATIVE
KETONES UR: NEGATIVE mg/dL
Leukocytes, UA: NEGATIVE
NITRITE: NEGATIVE
PH: 6 (ref 5.0–8.0)
Protein, ur: NEGATIVE mg/dL
SPECIFIC GRAVITY, URINE: 1.002 — AB (ref 1.005–1.030)

## 2017-08-09 LAB — COMPREHENSIVE METABOLIC PANEL
ALK PHOS: 71 U/L (ref 38–126)
ALT: 16 U/L — ABNORMAL LOW (ref 17–63)
ANION GAP: 10 (ref 5–15)
AST: 28 U/L (ref 15–41)
Albumin: 4.3 g/dL (ref 3.5–5.0)
BUN: 8 mg/dL (ref 6–20)
CALCIUM: 9 mg/dL (ref 8.9–10.3)
CHLORIDE: 107 mmol/L (ref 101–111)
CO2: 25 mmol/L (ref 22–32)
Creatinine, Ser: 1.01 mg/dL (ref 0.61–1.24)
GFR calc non Af Amer: >60 mL/min (ref >60)
GLUCOSE: 77 mg/dL (ref 65–99)
Potassium: 3.7 mmol/L (ref 3.5–5.1)
SODIUM: 142 mmol/L (ref 135–145)
Total Bilirubin: 0.7 mg/dL (ref 0.3–1.2)
Total Protein: 7.9 g/dL (ref 6.5–8.1)

## 2017-08-09 LAB — CBC
HCT: 35.8 % — ABNORMAL LOW (ref 39.0–52.0)
HEMOGLOBIN: 12.2 g/dL — AB (ref 13.0–17.0)
MCH: 31 pg (ref 26.0–34.0)
MCHC: 34.1 g/dL (ref 30.0–36.0)
MCV: 90.9 fL (ref 78.0–100.0)
PLATELETS: 189 10*3/uL (ref 150–400)
RBC: 3.94 MIL/uL — ABNORMAL LOW (ref 4.22–5.81)
RDW: 13.2 % (ref 11.5–15.5)
WBC: 6.6 10*3/uL (ref 4.0–10.5)

## 2017-08-09 LAB — RAPID URINE DRUG SCREEN, HOSP PERFORMED
AMPHETAMINES: NOT DETECTED
Barbiturates: NOT DETECTED
Benzodiazepines: NOT DETECTED
Cocaine: POSITIVE — AB
OPIATES: NOT DETECTED
TETRAHYDROCANNABINOL: NOT DETECTED

## 2017-08-09 LAB — I-STAT TROPONIN, ED: TROPONIN I, POC: 0 ng/mL (ref 0.00–0.08)

## 2017-08-09 LAB — MAGNESIUM: Magnesium: 2.1 mg/dL (ref 1.7–2.4)

## 2017-08-09 LAB — LIPASE, BLOOD: LIPASE: 40 U/L (ref 11–51)

## 2017-08-09 LAB — ETHANOL: Alcohol, Ethyl (B): 272 mg/dL — ABNORMAL HIGH (ref <10)

## 2017-08-09 MED ORDER — ASPIRIN 81 MG PO CHEW
324.0000 mg | CHEWABLE_TABLET | Freq: Once | ORAL | Status: AC
  Administered 2017-08-09: 324 mg via ORAL
  Filled 2017-08-09: qty 4

## 2017-08-09 MED ORDER — SODIUM CHLORIDE 0.9 % IV SOLN: INTRAVENOUS | Status: DC

## 2017-08-09 MED ORDER — SODIUM CHLORIDE 0.9 % IV BOLUS
1000.0000 mL | Freq: Once | INTRAVENOUS | Status: AC
  Administered 2017-08-09: 1000 mL via INTRAVENOUS

## 2017-08-09 NOTE — ED Notes (Signed)
Pt roommate is Morene Rankins [phone]. Call if needed, she does not have transportation.

## 2017-08-09 NOTE — Discharge Instructions (Addendum)
1. Medications: usual home medications 2. Treatment: rest, drink plenty of fluids,  3. Follow Up: Please followup with your primary doctor in 3-5 days for discussion of your diagnoses and further evaluation after today's visit including repeat bloodwork; Please return to the ER for new or persistent chest pain, sweating, passing out or other concerns

## 2017-08-09 NOTE — ED Provider Notes (Signed)
[facility] EMERGENCY DEPARTMENT Provider Note   CSN: [id number] Arrival date & time: 08/09/17  1928     History   Chief Complaint Chief Complaint  Patient presents with  . Chest Pain  . Herpes Zoster    HPI Austin Cobb is a 57 y.o. male with a hx of anxiety, arthritis, bipolar disorder, depression, GERD, substance abuse, manic disorder presents to the Emergency Department complaining of acute right-sided chest pain that radiated to his left side lasting approximately 10 seconds onset approximately 1 hour ago and not returning.  Reports no previous cardiac history.  He denies associated symptoms with the chest pain including nausea, diaphoresis, shortness of breath, syncope or near syncope.  Patient reports that he smokes more than 1 pack of cigarettes per day and drinks many beers every day.  Patient states he was recently diagnosed with shingles and has been dealing with the right-sided shingles pain for approximately 1 month.  No known aggravating or alleviating factors.  Patient does report that his shingles rash hurts with palpation.  He denies cough, fever, nasal congestion or other URI symptoms.  The history is provided by the patient and medical records. No language interpreter was used.    Past Medical History:  Diagnosis Date  . Anxiety   . Arthritis   . Bipolar 1 disorder ([place])   . Depression History of Bipolar   not on meds/pt. stopped meds on own over 12 yrs ago  . GERD (gastroesophageal reflux disease)   . History of colon polyps   . Manic disorder ([place])   . Substance abuse ([place])    52yrs ago smoke crack and marjuana  . Tobacco abuse 05/13/2012    Patient Active Problem List   Diagnosis Date Noted  . Herpes zoster without complication 76/28/3151  . History of fall 07/23/2017  . Prostatitis 06/23/2014  . Serrated adenoma of colon 05/13/2012  . Anal fissure 05/13/2012  . Tobacco abuse 05/13/2012  . 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 Chronic shoulder pain 01/03/2012  . Neck pain, chronic 01/03/2012  . Mood disorder ([place]) 01/03/2012  . Depression with anxiety 01/03/2012    Past Surgical History:  Procedure Laterality Date  . EXAMINATION UNDER ANESTHESIA N/A 11/12/2012   Procedure: EXAM UNDER ANESTHESIA;  Surgeon: Adin Hector, MD;  Location: [facility];  Service: General;  Laterality: N/A;  . EYE SURGERY     bilateral eye sx as a child [age]yrs old.  Joan Mayans N/A 11/12/2012   Procedure: Joan Mayans;  Surgeon: Adin Hector, MD;  Location: [facility];  Service: General;  Laterality: N/A;  . TONSILLECTOMY          Home Medications    Prior to Admission medications   Medication Sig Start Date End Date Taking? Authorizing Provider  aspirin 81 MG tablet Take 81 mg by mouth 2 (two) times daily.     [provider]  Diphenhydramine-APAP, sleep, (TYLENOL PM EXTRA STRENGTH PO) Take by mouth. Take 2 at bedtime    [provider]  hydrOXYzine (ATARAX/VISTARIL) 50 MG tablet Take 0.5 tablets (25 mg total) by mouth every 8 (eight) hours as needed for anxiety (sleep). Patient not taking: Reported on 07/23/2017 05/12/16   Duffy Bruce, MD  valACYclovir (VALTREX) 1000 MG tablet Take 1 tablet (1,000 mg total) by mouth 2 (two) times daily. 07/23/17   Horald Pollen, MD    Family History Family History  Problem Relation Age of Onset  . Colon polyps  Father   . Diabetes Father   . Diabetes Brother     Social History Social History   Tobacco Use  . Smoking status: Current Every Day Smoker    Packs/day: 1.00    Years: 35.00    Pack years: 35.00    Types: Cigarettes  . Smokeless tobacco: Never Used  Substance Use Topics  . Alcohol use: Yes    Alcohol/week: 2.4 oz    Types: 4 Cans of beer per week  . Drug use: Yes    Types: Cocaine, Marijuana    Comment: Quit x6 yrs ago-      Allergies   Patient has no known allergies.   Review of Systems Review of Systems  Constitutional:  Negative for appetite change, diaphoresis, fatigue, fever and unexpected weight change.  HENT: Negative for mouth sores.   Eyes: Negative for visual disturbance.  Respiratory: Negative for cough, chest tightness, shortness of breath and wheezing.   Cardiovascular: Positive for chest pain.  Gastrointestinal: Negative for abdominal pain, constipation, diarrhea, nausea and vomiting.  Endocrine: Negative for polydipsia, polyphagia and polyuria.  Genitourinary: Negative for dysuria, frequency, hematuria and urgency.  Musculoskeletal: Negative for back pain and neck stiffness.  Skin: Positive for rash.  Allergic/Immunologic: Negative for immunocompromised state.  Neurological: Negative for syncope, light-headedness and headaches.  Hematological: Does not bruise/bleed easily.  Psychiatric/Behavioral: Negative for sleep disturbance. The patient is not nervous/anxious.      Physical Exam Updated Vital Signs BP 107/74   Pulse 80   Resp 12   Ht 5\' 10"  (1.778 m)   Wt 61.2 kg (135 lb)   SpO2 98%   BMI 19.37 kg/m   Physical Exam  Constitutional: He appears well-developed and well-nourished. No distress.  Awake, alert, nontoxic appearance  HENT:  Head: Normocephalic and atraumatic.  Mouth/Throat: Oropharynx is clear and moist. No oropharyngeal exudate.  Eyes: Conjunctivae are normal. No scleral icterus.  Neck: Normal range of motion. Neck supple.  Cardiovascular: Normal rate, regular rhythm and intact distal pulses.  Pulmonary/Chest: Effort normal and breath sounds normal. No respiratory distress. He has no wheezes.  Equal chest expansion  Abdominal: Soft. Bowel sounds are normal. He exhibits no mass. There is no tenderness. There is no rebound and no guarding.  Healing rash standing from the right paraspinal muscles around the right side of his chest and across the right side of his chest to his sternum.  This is tender to palpation.  Musculoskeletal: Normal range of motion. He exhibits no  edema.  Neurological: He is alert.  Speech is clear and goal oriented Moves extremities without ataxia  Skin: Skin is warm and dry. Rash noted. He is not diaphoretic.  Psychiatric: He has a normal mood and affect.  Nursing note and vitals reviewed.    ED Treatments / Results  Labs (all labs ordered are listed, but only abnormal results are displayed) Labs Reviewed  CBC - Abnormal; Notable for the following components:      Result Value   RBC 3.94 (*)    Hemoglobin 12.2 (*)    HCT 35.8 (*)    All other components within normal limits  COMPREHENSIVE METABOLIC PANEL - Abnormal; Notable for the following components:   ALT 16 (*)    All other components within normal limits  RAPID URINE DRUG SCREEN, HOSP PERFORMED - Abnormal; Notable for the following components:   Cocaine POSITIVE (*)    All other components within normal limits  URINALYSIS, ROUTINE W REFLEX MICROSCOPIC - Abnormal;  Notable for the following components:   Color, Urine STRAW (*)    Specific Gravity, Urine 1.002 (*)    All other components within normal limits  ETHANOL - Abnormal; Notable for the following components:   Alcohol, Ethyl (B) 272 (*)    All other components within normal limits  MAGNESIUM  LIPASE, BLOOD  CBG MONITORING, ED  I-STAT TROPONIN, ED    EKG EKG Interpretation  Date/Time:  Saturday August 09 2017 19:57:59 EDT Ventricular Rate:  84 PR Interval:    QRS Duration: 101 QT Interval:  361 QTC Calculation: 427 R Axis:   71 Text Interpretation:  Sinus rhythm Probable left ventricular hypertrophy Anterior Q waves, possibly due to LVH ST elevation, consider inferior injury No significant change since last tracing Confirmed by Blanchie Dessert [phone]) on 08/09/2017 8:02:17 PM   Radiology Dg Chest 2 View  Result Date: 08/09/2017 CLINICAL DATA:  Acute chest pain. EXAM: CHEST - 2 VIEW COMPARISON:  05/12/2016 chest radiograph FINDINGS: Mild cardiomegaly noted. There is no evidence of focal airspace  disease, pulmonary edema, suspicious pulmonary nodule/mass, pleural effusion, or pneumothorax. No acute bony abnormalities are identified. Remote LEFT rib fractures noted. IMPRESSION: Mild cardiomegaly without evidence of acute cardiopulmonary disease. Electronically Signed   By: Margarette Canada M.D.   On: 08/09/2017 20:29    Procedures Procedures (including critical care time)  Medications Ordered in ED Medications  sodium chloride 0.9 % bolus 1,000 mL (0 mLs Intravenous Stopped 08/09/17 2226)    And  0.9 %  sodium chloride infusion ( Intravenous Not Given 08/09/17 2045)  aspirin chewable tablet 324 mg (324 mg Oral Given 08/09/17 2028)     Initial Impression / Assessment and Plan / ED Course  I have reviewed the triage vital signs and the nursing notes.  Pertinent labs & imaging results that were available during my care of the patient were reviewed by me and considered in my medical decision making (see chart for details).  Clinical Course as of Aug 10 2230  Sat Aug 09, 2017  2052 EKG is without ischemia and is unchanged from prior.   [HM]  2052 No pneumothorax, pulmonary edema or evidence of pneumonia.  No lung nodules noted.  I personally evaluated these images.  DG Chest 2 View [HM]  2052 Patient noted to be hypertensive.  Suspect this has been long-term due to his degree of LVH on his EKG.  BP(!): 197/98 [HM]  2227 Blood pressure improved significantly after patient calmed down.  BP: 107/74 [HM]  2228 Mild anemia noted.  Below baseline which was checked more than 1 year ago.  Hemoglobin(!): 12.2 [HM]  2228 Negative  Troponin i, poc: 0.00 [HM]  2228 Noted  COCAINE(!): POSITIVE [HM]  2228 Elevated  Alcohol, Ethyl (B)(!): 272 [HM]    Clinical Course User Index [HM] Levada Bowersox, Jarrett Soho, PA-C    With complaints of fleeting chest pain.  Pain has not returned throughout his time here in the emergency department.  Troponin is negative and EKG is without acute ischemia.  Patient's pain  is more likely related to his shingles.  Patient is cocaine positive and intoxicated.  Mild anemia, however I do not believe that this is the cause of his chest pain.  Labs otherwise reassuring.  Patient will have close follow-up with his primary care provider.  Discussed reasons to return immediately to the emergency department including new or worsening chest pain.  Patient states understanding and is in agreement with this plan.  Final Clinical Impressions(s) /  ED Diagnoses   Final diagnoses:  Herpes zoster without complication  Chest pain, unspecified type  Anemia, unspecified type    ED Discharge Orders    None       Audley Hinojos, Gwenlyn Perking 08/09/17 2232    Blanchie Dessert, MD 08/10/17 2130

## 2017-08-09 NOTE — ED Triage Notes (Signed)
Pt arrived via [facility] from home c/o chest pain radiating across chest and a shingles rash.

## 2017-08-12 ENCOUNTER — Telehealth: Payer: Self-pay | Admitting: Emergency Medicine

## 2017-08-12 NOTE — Telephone Encounter (Signed)
Called pt to try and get him scheduled for an OV for a refill for shingles med. He will need to be seen by someone before a refill will be given. Could not leave a VM - no DPR and no VM.

## 2019-05-04 ENCOUNTER — Other Ambulatory Visit: Payer: Self-pay

## 2019-05-04 ENCOUNTER — Emergency Department (HOSPITAL_COMMUNITY): Payer: Medicare PPO

## 2019-05-04 ENCOUNTER — Inpatient Hospital Stay (HOSPITAL_COMMUNITY)
Admission: EM | Admit: 2019-05-04 | Discharge: 2019-05-26 | DRG: 286 | Disposition: A | Discharge status: Skilled Nursing Facility | Payer: Medicare PPO | Attending: Internal Medicine | Admitting: Internal Medicine

## 2019-05-04 ENCOUNTER — Encounter (HOSPITAL_COMMUNITY): Payer: Self-pay | Admitting: Physician Assistant

## 2019-05-04 DIAGNOSIS — I4891 Unspecified atrial fibrillation: Secondary | ICD-10-CM | POA: Diagnosis not present

## 2019-05-04 DIAGNOSIS — D696 Thrombocytopenia, unspecified: Secondary | ICD-10-CM | POA: Diagnosis not present

## 2019-05-04 DIAGNOSIS — F10929 Alcohol use, unspecified with intoxication, unspecified: Secondary | ICD-10-CM | POA: Diagnosis not present

## 2019-05-04 DIAGNOSIS — I081 Rheumatic disorders of both mitral and tricuspid valves: Secondary | ICD-10-CM | POA: Diagnosis not present

## 2019-05-04 DIAGNOSIS — B9620 Unspecified Escherichia coli [E. coli] as the cause of diseases classified elsewhere: Secondary | ICD-10-CM | POA: Diagnosis present

## 2019-05-04 DIAGNOSIS — F1410 Cocaine abuse, uncomplicated: Secondary | ICD-10-CM | POA: Diagnosis present

## 2019-05-04 DIAGNOSIS — I5082 Biventricular heart failure: Secondary | ICD-10-CM | POA: Diagnosis not present

## 2019-05-04 DIAGNOSIS — N179 Acute kidney failure, unspecified: Secondary | ICD-10-CM | POA: Diagnosis not present

## 2019-05-04 DIAGNOSIS — R4182 Altered mental status, unspecified: Secondary | ICD-10-CM | POA: Diagnosis not present

## 2019-05-04 DIAGNOSIS — I4589 Other specified conduction disorders: Secondary | ICD-10-CM | POA: Diagnosis present

## 2019-05-04 DIAGNOSIS — I509 Heart failure, unspecified: Secondary | ICD-10-CM | POA: Diagnosis not present

## 2019-05-04 DIAGNOSIS — F1910 Other psychoactive substance abuse, uncomplicated: Secondary | ICD-10-CM | POA: Diagnosis present

## 2019-05-04 DIAGNOSIS — G9341 Metabolic encephalopathy: Secondary | ICD-10-CM | POA: Diagnosis present

## 2019-05-04 DIAGNOSIS — F329 Major depressive disorder, single episode, unspecified: Secondary | ICD-10-CM | POA: Diagnosis not present

## 2019-05-04 DIAGNOSIS — R7989 Other specified abnormal findings of blood chemistry: Secondary | ICD-10-CM | POA: Diagnosis present

## 2019-05-04 DIAGNOSIS — F17210 Nicotine dependence, cigarettes, uncomplicated: Secondary | ICD-10-CM | POA: Diagnosis present

## 2019-05-04 DIAGNOSIS — I5041 Acute combined systolic (congestive) and diastolic (congestive) heart failure: Secondary | ICD-10-CM | POA: Diagnosis not present

## 2019-05-04 DIAGNOSIS — J90 Pleural effusion, not elsewhere classified: Secondary | ICD-10-CM | POA: Diagnosis present

## 2019-05-04 DIAGNOSIS — Z20822 Contact with and (suspected) exposure to covid-19: Secondary | ICD-10-CM | POA: Diagnosis present

## 2019-05-04 DIAGNOSIS — F10939 Alcohol use, unspecified with withdrawal, unspecified: Secondary | ICD-10-CM | POA: Diagnosis present

## 2019-05-04 DIAGNOSIS — Z7982 Long term (current) use of aspirin: Secondary | ICD-10-CM

## 2019-05-04 DIAGNOSIS — R030 Elevated blood-pressure reading, without diagnosis of hypertension: Secondary | ICD-10-CM | POA: Diagnosis not present

## 2019-05-04 DIAGNOSIS — I5042 Chronic combined systolic (congestive) and diastolic (congestive) heart failure: Secondary | ICD-10-CM | POA: Diagnosis present

## 2019-05-04 DIAGNOSIS — F319 Bipolar disorder, unspecified: Secondary | ICD-10-CM | POA: Diagnosis not present

## 2019-05-04 DIAGNOSIS — R5381 Other malaise: Secondary | ICD-10-CM | POA: Diagnosis not present

## 2019-05-04 DIAGNOSIS — I428 Other cardiomyopathies: Secondary | ICD-10-CM | POA: Diagnosis present

## 2019-05-04 DIAGNOSIS — N390 Urinary tract infection, site not specified: Secondary | ICD-10-CM | POA: Diagnosis present

## 2019-05-04 DIAGNOSIS — R778 Other specified abnormalities of plasma proteins: Secondary | ICD-10-CM | POA: Diagnosis not present

## 2019-05-04 DIAGNOSIS — R0602 Shortness of breath: Secondary | ICD-10-CM

## 2019-05-04 DIAGNOSIS — Z8371 Family history of colonic polyps: Secondary | ICD-10-CM

## 2019-05-04 DIAGNOSIS — E875 Hyperkalemia: Secondary | ICD-10-CM | POA: Diagnosis not present

## 2019-05-04 DIAGNOSIS — I248 Other forms of acute ischemic heart disease: Secondary | ICD-10-CM | POA: Diagnosis not present

## 2019-05-04 DIAGNOSIS — R531 Weakness: Secondary | ICD-10-CM | POA: Diagnosis not present

## 2019-05-04 DIAGNOSIS — I2510 Atherosclerotic heart disease of native coronary artery without angina pectoris: Secondary | ICD-10-CM | POA: Diagnosis not present

## 2019-05-04 DIAGNOSIS — I5043 Acute on chronic combined systolic (congestive) and diastolic (congestive) heart failure: Secondary | ICD-10-CM | POA: Diagnosis not present

## 2019-05-04 DIAGNOSIS — F1020 Alcohol dependence, uncomplicated: Secondary | ICD-10-CM | POA: Diagnosis not present

## 2019-05-04 DIAGNOSIS — I10 Essential (primary) hypertension: Secondary | ICD-10-CM | POA: Diagnosis not present

## 2019-05-04 DIAGNOSIS — I484 Atypical atrial flutter: Secondary | ICD-10-CM | POA: Diagnosis not present

## 2019-05-04 DIAGNOSIS — K761 Chronic passive congestion of liver: Secondary | ICD-10-CM | POA: Diagnosis present

## 2019-05-04 DIAGNOSIS — R0902 Hypoxemia: Secondary | ICD-10-CM | POA: Diagnosis not present

## 2019-05-04 DIAGNOSIS — Z209 Contact with and (suspected) exposure to unspecified communicable disease: Secondary | ICD-10-CM | POA: Diagnosis not present

## 2019-05-04 DIAGNOSIS — F1010 Alcohol abuse, uncomplicated: Secondary | ICD-10-CM

## 2019-05-04 DIAGNOSIS — F10231 Alcohol dependence with withdrawal delirium: Secondary | ICD-10-CM | POA: Diagnosis not present

## 2019-05-04 DIAGNOSIS — D75829 Heparin-induced thrombocytopenia, unspecified: Secondary | ICD-10-CM

## 2019-05-04 DIAGNOSIS — R000 Tachycardia, unspecified: Secondary | ICD-10-CM | POA: Diagnosis not present

## 2019-05-04 DIAGNOSIS — I4892 Unspecified atrial flutter: Secondary | ICD-10-CM | POA: Diagnosis present

## 2019-05-04 DIAGNOSIS — F1027 Alcohol dependence with alcohol-induced persisting dementia: Secondary | ICD-10-CM | POA: Diagnosis present

## 2019-05-04 DIAGNOSIS — Z79899 Other long term (current) drug therapy: Secondary | ICD-10-CM

## 2019-05-04 DIAGNOSIS — T508X5A Adverse effect of diagnostic agents, initial encounter: Secondary | ICD-10-CM | POA: Diagnosis present

## 2019-05-04 DIAGNOSIS — F17200 Nicotine dependence, unspecified, uncomplicated: Secondary | ICD-10-CM | POA: Diagnosis not present

## 2019-05-04 DIAGNOSIS — I4819 Other persistent atrial fibrillation: Secondary | ICD-10-CM | POA: Diagnosis present

## 2019-05-04 DIAGNOSIS — E876 Hypokalemia: Secondary | ICD-10-CM | POA: Diagnosis not present

## 2019-05-04 DIAGNOSIS — I5021 Acute systolic (congestive) heart failure: Secondary | ICD-10-CM | POA: Diagnosis not present

## 2019-05-04 DIAGNOSIS — E43 Unspecified severe protein-calorie malnutrition: Secondary | ICD-10-CM | POA: Diagnosis not present

## 2019-05-04 DIAGNOSIS — N141 Nephropathy induced by other drugs, medicaments and biological substances: Secondary | ICD-10-CM | POA: Diagnosis present

## 2019-05-04 DIAGNOSIS — F419 Anxiety disorder, unspecified: Secondary | ICD-10-CM | POA: Diagnosis present

## 2019-05-04 DIAGNOSIS — Z9119 Patient's noncompliance with other medical treatment and regimen: Secondary | ICD-10-CM

## 2019-05-04 DIAGNOSIS — M2550 Pain in unspecified joint: Secondary | ICD-10-CM | POA: Diagnosis not present

## 2019-05-04 DIAGNOSIS — N189 Chronic kidney disease, unspecified: Secondary | ICD-10-CM | POA: Diagnosis present

## 2019-05-04 DIAGNOSIS — F10239 Alcohol dependence with withdrawal, unspecified: Secondary | ICD-10-CM | POA: Diagnosis present

## 2019-05-04 DIAGNOSIS — E0781 Sick-euthyroid syndrome: Secondary | ICD-10-CM | POA: Diagnosis present

## 2019-05-04 DIAGNOSIS — R748 Abnormal levels of other serum enzymes: Secondary | ICD-10-CM | POA: Diagnosis not present

## 2019-05-04 DIAGNOSIS — Z7401 Bed confinement status: Secondary | ICD-10-CM | POA: Diagnosis not present

## 2019-05-04 DIAGNOSIS — F1210 Cannabis abuse, uncomplicated: Secondary | ICD-10-CM | POA: Diagnosis present

## 2019-05-04 DIAGNOSIS — K7040 Alcoholic hepatic failure without coma: Secondary | ICD-10-CM | POA: Diagnosis present

## 2019-05-04 DIAGNOSIS — J9600 Acute respiratory failure, unspecified whether with hypoxia or hypercapnia: Secondary | ICD-10-CM | POA: Diagnosis present

## 2019-05-04 DIAGNOSIS — Z91199 Patient's noncompliance with other medical treatment and regimen due to unspecified reason: Secondary | ICD-10-CM

## 2019-05-04 DIAGNOSIS — I429 Cardiomyopathy, unspecified: Secondary | ICD-10-CM | POA: Diagnosis not present

## 2019-05-04 DIAGNOSIS — R451 Restlessness and agitation: Secondary | ICD-10-CM | POA: Diagnosis present

## 2019-05-04 DIAGNOSIS — I110 Hypertensive heart disease with heart failure: Secondary | ICD-10-CM | POA: Diagnosis not present

## 2019-05-04 DIAGNOSIS — B3749 Other urogenital candidiasis: Secondary | ICD-10-CM | POA: Diagnosis present

## 2019-05-04 DIAGNOSIS — D7582 Heparin induced thrombocytopenia (HIT): Secondary | ICD-10-CM | POA: Diagnosis not present

## 2019-05-04 DIAGNOSIS — F19939 Other psychoactive substance use, unspecified with withdrawal, unspecified: Secondary | ICD-10-CM | POA: Diagnosis not present

## 2019-05-04 DIAGNOSIS — Z833 Family history of diabetes mellitus: Secondary | ICD-10-CM

## 2019-05-04 DIAGNOSIS — K219 Gastro-esophageal reflux disease without esophagitis: Secondary | ICD-10-CM | POA: Diagnosis present

## 2019-05-04 DIAGNOSIS — I340 Nonrheumatic mitral (valve) insufficiency: Secondary | ICD-10-CM | POA: Diagnosis not present

## 2019-05-04 LAB — MAGNESIUM: Magnesium: 1.6 mg/dL — ABNORMAL LOW (ref 1.7–2.4)

## 2019-05-04 LAB — CBC
HCT: 35.3 % — ABNORMAL LOW (ref 39.0–52.0)
Hemoglobin: 11.8 g/dL — ABNORMAL LOW (ref 13.0–17.0)
MCH: 31.3 pg (ref 26.0–34.0)
MCHC: 33.4 g/dL (ref 30.0–36.0)
MCV: 93.6 fL (ref 80.0–100.0)
Platelets: 167 10*3/uL (ref 150–400)
RBC: 3.77 MIL/uL — ABNORMAL LOW (ref 4.22–5.81)
RDW: 14 % (ref 11.5–15.5)
WBC: 5.8 10*3/uL (ref 4.0–10.5)
nRBC: 0 % (ref 0.0–0.2)

## 2019-05-04 LAB — COMPREHENSIVE METABOLIC PANEL
ALT: 46 U/L — ABNORMAL HIGH (ref 0–44)
ALT: 53 U/L — ABNORMAL HIGH (ref 0–44)
AST: 66 U/L — ABNORMAL HIGH (ref 15–41)
AST: 77 U/L — ABNORMAL HIGH (ref 15–41)
Albumin: 3.4 g/dL — ABNORMAL LOW (ref 3.5–5.0)
Albumin: 3.5 g/dL (ref 3.5–5.0)
Alkaline Phosphatase: 73 U/L (ref 38–126)
Alkaline Phosphatase: 76 U/L (ref 38–126)
Anion gap: 10 (ref 5–15)
Anion gap: 13 (ref 5–15)
BUN: 19 mg/dL (ref 6–20)
BUN: 20 mg/dL (ref 6–20)
CO2: 21 mmol/L — ABNORMAL LOW (ref 22–32)
CO2: 21 mmol/L — ABNORMAL LOW (ref 22–32)
Calcium: 8.7 mg/dL — ABNORMAL LOW (ref 8.9–10.3)
Calcium: 8.7 mg/dL — ABNORMAL LOW (ref 8.9–10.3)
Chloride: 108 mmol/L (ref 98–111)
Chloride: 105 mmol/L (ref 98–111)
Creatinine, Ser: 1.19 mg/dL (ref 0.61–1.24)
Creatinine, Ser: 1.25 mg/dL — ABNORMAL HIGH (ref 0.61–1.24)
GFR calc Af Amer: >60 mL/min (ref >60)
GFR calc Af Amer: >60 mL/min (ref >60)
GFR calc non Af Amer: >60 mL/min (ref >60)
GFR calc non Af Amer: >60 mL/min (ref >60)
Glucose, Bld: 124 mg/dL — ABNORMAL HIGH (ref 70–99)
Glucose, Bld: 156 mg/dL — ABNORMAL HIGH (ref 70–99)
Potassium: 4.4 mmol/L (ref 3.5–5.1)
Potassium: 4.1 mmol/L (ref 3.5–5.1)
Sodium: 139 mmol/L (ref 135–145)
Sodium: 139 mmol/L (ref 135–145)
Total Bilirubin: 0.9 mg/dL (ref 0.3–1.2)
Total Bilirubin: 1.1 mg/dL (ref 0.3–1.2)
Total Protein: 6.4 g/dL — ABNORMAL LOW (ref 6.5–8.1)
Total Protein: 6.5 g/dL (ref 6.5–8.1)

## 2019-05-04 LAB — PHOSPHORUS: Phosphorus: 4.2 mg/dL (ref 2.5–4.6)

## 2019-05-04 LAB — CBC WITH DIFFERENTIAL/PLATELET
Abs Immature Granulocytes: 0.02 10*3/uL (ref 0.00–0.07)
Basophils Absolute: 0.1 10*3/uL (ref 0.0–0.1)
Basophils Relative: 2 %
Eosinophils Absolute: 0.1 10*3/uL (ref 0.0–0.5)
Eosinophils Relative: 2 %
HCT: 35.2 % — ABNORMAL LOW (ref 39.0–52.0)
Hemoglobin: 11.7 g/dL — ABNORMAL LOW (ref 13.0–17.0)
Immature Granulocytes: 0 %
Lymphocytes Relative: 23 %
Lymphs Abs: 1.5 10*3/uL (ref 0.7–4.0)
MCH: 32 pg (ref 26.0–34.0)
MCHC: 33.2 g/dL (ref 30.0–36.0)
MCV: 96.2 fL (ref 80.0–100.0)
Monocytes Absolute: 0.6 10*3/uL (ref 0.1–1.0)
Monocytes Relative: 9 %
Neutro Abs: 4.2 10*3/uL (ref 1.7–7.7)
Neutrophils Relative %: 64 %
Platelets: 152 10*3/uL (ref 150–400)
RBC: 3.66 MIL/uL — ABNORMAL LOW (ref 4.22–5.81)
RDW: 14.2 % (ref 11.5–15.5)
WBC: 6.5 10*3/uL (ref 4.0–10.5)
nRBC: 0 % (ref 0.0–0.2)

## 2019-05-04 LAB — LACTIC ACID, PLASMA
Lactic Acid, Venous: 2.1 mmol/L (ref 0.5–1.9)
Lactic Acid, Venous: 1.7 mmol/L (ref 0.5–1.9)

## 2019-05-04 LAB — URINALYSIS, ROUTINE W REFLEX MICROSCOPIC
Bilirubin Urine: NEGATIVE
Glucose, UA: NEGATIVE mg/dL
Hgb urine dipstick: NEGATIVE
Ketones, ur: NEGATIVE mg/dL
Nitrite: NEGATIVE
Protein, ur: 100 mg/dL — AB
Specific Gravity, Urine: 1.024 (ref 1.005–1.030)
WBC, UA: >50 WBC/hpf — ABNORMAL HIGH (ref 0–5)
pH: 5 (ref 5.0–8.0)

## 2019-05-04 LAB — POC SARS CORONAVIRUS 2 AG -  ED: SARS Coronavirus 2 Ag: NEGATIVE

## 2019-05-04 LAB — TROPONIN I (HIGH SENSITIVITY)
Troponin I (High Sensitivity): 48 ng/L — ABNORMAL HIGH (ref <18)
Troponin I (High Sensitivity): 50 ng/L — ABNORMAL HIGH (ref <18)

## 2019-05-04 LAB — RAPID URINE DRUG SCREEN, HOSP PERFORMED
Amphetamines: NOT DETECTED
Barbiturates: NOT DETECTED
Benzodiazepines: NOT DETECTED
Cocaine: NOT DETECTED
Opiates: NOT DETECTED
Tetrahydrocannabinol: NOT DETECTED

## 2019-05-04 LAB — TSH: TSH: 5.358 u[IU]/mL — ABNORMAL HIGH (ref 0.350–4.500)

## 2019-05-04 LAB — BRAIN NATRIURETIC PEPTIDE: B Natriuretic Peptide: 2060.2 pg/mL — ABNORMAL HIGH (ref 0.0–100.0)

## 2019-05-04 LAB — HIV ANTIBODY (ROUTINE TESTING W REFLEX): HIV Screen 4th Generation wRfx: NONREACTIVE

## 2019-05-04 LAB — D-DIMER, QUANTITATIVE: D-Dimer, Quant: 1.54 ug/mL-FEU — ABNORMAL HIGH (ref 0.00–0.50)

## 2019-05-04 MED ORDER — SODIUM CHLORIDE 0.9 % IV SOLN
1.0000 g | INTRAVENOUS | Status: DC
  Administered 2019-05-05 – 2019-05-07 (×4): 1 g via INTRAVENOUS
  Filled 2019-05-04 (×3): qty 10

## 2019-05-04 MED ORDER — METOPROLOL TARTRATE 5 MG/5ML IV SOLN
2.5000 mg | INTRAVENOUS | Status: DC | PRN
  Administered 2019-05-04 – 2019-05-05 (×3): 2.5 mg via INTRAVENOUS
  Filled 2019-05-04 (×2): qty 5

## 2019-05-04 MED ORDER — HEPARIN BOLUS VIA INFUSION
4000.0000 [IU] | Freq: Once | INTRAVENOUS | Status: AC
  Administered 2019-05-04: 4000 [IU] via INTRAVENOUS
  Filled 2019-05-04: qty 4000

## 2019-05-04 MED ORDER — SODIUM CHLORIDE 0.9 % IV SOLN
1.0000 g | Freq: Once | INTRAVENOUS | Status: AC
  Administered 2019-05-04: 1 g via INTRAVENOUS
  Filled 2019-05-04: qty 10

## 2019-05-04 MED ORDER — ADULT MULTIVITAMIN W/MINERALS CH
1.0000 | ORAL_TABLET | Freq: Every day | ORAL | Status: DC
  Administered 2019-05-04 – 2019-05-14 (×9): 1 via ORAL
  Filled 2019-05-04 (×10): qty 1

## 2019-05-04 MED ORDER — METOPROLOL TARTRATE 25 MG PO TABS
25.0000 mg | ORAL_TABLET | Freq: Four times a day (QID) | ORAL | Status: DC
  Administered 2019-05-04 – 2019-05-06 (×7): 25 mg via ORAL
  Filled 2019-05-04 (×7): qty 1

## 2019-05-04 MED ORDER — LORAZEPAM 2 MG/ML IJ SOLN
1.0000 mg | INTRAMUSCULAR | Status: AC | PRN
  Administered 2019-05-06 – 2019-05-07 (×5): 2 mg via INTRAVENOUS
  Filled 2019-05-04 (×4): qty 1
  Filled 2019-05-04: qty 2
  Filled 2019-05-04: qty 1

## 2019-05-04 MED ORDER — HEPARIN (PORCINE) 25000 UT/250ML-% IV SOLN
1150.0000 [IU]/h | INTRAVENOUS | Status: DC
  Administered 2019-05-04: 1150 [IU]/h via INTRAVENOUS
  Filled 2019-05-04: qty 250

## 2019-05-04 MED ORDER — THIAMINE HCL 100 MG/ML IJ SOLN
100.0000 mg | Freq: Every day | INTRAMUSCULAR | Status: DC
  Administered 2019-05-07 – 2019-05-20 (×3): 100 mg via INTRAVENOUS
  Filled 2019-05-04 (×3): qty 2

## 2019-05-04 MED ORDER — FUROSEMIDE 10 MG/ML IJ SOLN
40.0000 mg | Freq: Two times a day (BID) | INTRAMUSCULAR | Status: DC
  Administered 2019-05-05 (×2): 40 mg via INTRAVENOUS
  Filled 2019-05-04 (×2): qty 4

## 2019-05-04 MED ORDER — FOLIC ACID 1 MG PO TABS
1.0000 mg | ORAL_TABLET | Freq: Every day | ORAL | Status: DC
  Administered 2019-05-04 – 2019-05-14 (×10): 1 mg via ORAL
  Filled 2019-05-04 (×11): qty 1

## 2019-05-04 MED ORDER — LORAZEPAM 1 MG PO TABS
1.0000 mg | ORAL_TABLET | ORAL | Status: AC | PRN
  Administered 2019-05-06: 2 mg via ORAL
  Filled 2019-05-04: qty 2

## 2019-05-04 MED ORDER — FUROSEMIDE 10 MG/ML IJ SOLN
40.0000 mg | Freq: Once | INTRAMUSCULAR | Status: AC
  Administered 2019-05-04: 40 mg via INTRAVENOUS
  Filled 2019-05-04: qty 4

## 2019-05-04 MED ORDER — LORAZEPAM 2 MG/ML IJ SOLN
0.5000 mg | Freq: Once | INTRAMUSCULAR | Status: AC
  Administered 2019-05-04: 0.5 mg via INTRAVENOUS
  Filled 2019-05-04: qty 1

## 2019-05-04 MED ORDER — THIAMINE HCL 100 MG PO TABS
100.0000 mg | ORAL_TABLET | Freq: Every day | ORAL | Status: DC
  Administered 2019-05-04 – 2019-05-26 (×20): 100 mg via ORAL
  Filled 2019-05-04 (×21): qty 1

## 2019-05-04 MED ORDER — FUROSEMIDE 10 MG/ML IJ SOLN: 40.0000 mg | Freq: Two times a day (BID) | INTRAMUSCULAR | Status: DC

## 2019-05-04 NOTE — H&P (Signed)
History and Physical  Austin Cobb [id number] DOB: [birth year] DOA: 05/04/2019  Referring physician: ER provider PCP: Patient, No Pcp Per  Outpatient Specialists:    Patient coming from: Home  Chief Complaint: Shortness of breath  HPI:  Patient is a 59 year old African-American male with past medical history significant for tobacco use disorder, substance abuse (crack and marijuana), bipolar disorder and lifelong alcoholism.  Patient's last drink was 2 days ago.  Patient presents with 1 day history of worsening shortness of breath.  On further questioning, patient reported that he has had dyspnea on exertion and fatigue for about 2 months or more, with associated paroxysmal nocturnal dyspnea.  No leg edema.  No fever or chills, no headache, no neck pain, no URI symptoms, no GI symptoms or urinary symptoms.  Patient has intermittent palpitations and vague chest pain.  Due to worsening shortness of breath, patient decided to come to the hospital for further assessment and management.  On presentation, patient was found to be in atrial flutter with rapid ventricular response (heart rate in the 130s), elevated blood pressure with systolic blood pressure of 170s, elevated cardiac BNP and mildly elevated troponin.  Chest x-ray reveals cardiomegaly and right pleural effusion.  Hospitalist team has been asked to admit patient for further assessment and management.  ED Course: On presentation to the hospital, vitals revealed HR 98.3, blood pressure of 171/135 mmHg, heart rate of 132, respiratory rate of 21 and O2 sat of 99%.  EKG revealed atrial flutter with RVR.  Pertinent labs as documented above.  ER team is consulted the cardiology team.  Patient has also received 1 dose of IV Lasix 40 mg.  UA reveals pyuria and many bacteria, urine cultures pending and patient has received 1 dose of Rocephin.  Cardiology will proceed with echocardiogram as per cardiology documentation and possible ischemic work-up if  EF is impaired.  Pertinent labs: As documented above.  EKG: Independently reviewed.   Imaging: independently reviewed.   Review of Systems:  Negative for fever, visual changes, sore throat, rash, new muscle aches, dysuria, bleeding, n/v/abdominal pain.  Past Medical History:  Diagnosis Date  . Anxiety   . Arthritis   . Bipolar 1 disorder ([place])   . Depression    Bipolar, not on meds/pt. stopped meds on own over 12 yrs ago  . GERD (gastroesophageal reflux disease)   . History of colon polyps   . Manic disorder ([place])   . Substance abuse ([place])    48yrs ago smoke crack and marjuana  . Tobacco abuse 05/13/2012    Past Surgical History:  Procedure Laterality Date  . EXAMINATION UNDER ANESTHESIA N/A 11/12/2012   Procedure: EXAM UNDER ANESTHESIA;  Surgeon: Adin Hector, MD;  Location: [facility];  Service: General;  Laterality: N/A;  . EYE SURGERY     bilateral eye sx as a child 6-80yrs old.  Joan Mayans N/A 11/12/2012   Procedure: Joan Mayans;  Surgeon: Adin Hector, MD;  Location: [facility];  Service: General;  Laterality: N/A;  . TONSILLECTOMY       reports that he has been smoking cigarettes. He has a 35.00 pack-year smoking history. He has never used smokeless tobacco. He reports current alcohol use of about 4.0 standard drinks of alcohol per week. He reports current drug use. Drugs: Cocaine and Marijuana.  No Known Allergies  Family History  Problem Relation Age of Onset  . Colon polyps Father   . Diabetes Father   . Diabetes Brother  Prior to Admission medications   Medication Sig Start Date End Date Taking? Authorizing Provider  aspirin 81 MG tablet Take 81 mg by mouth 2 (two) times daily.    Yes [provider]  Diphenhydramine-APAP, sleep, (TYLENOL PM EXTRA STRENGTH PO) Take by mouth. Take 2 at bedtime   Yes [provider]  OVER THE COUNTER MEDICATION Apply 1 application topically as needed (for itching).   Yes [provider]   hydrOXYzine (ATARAX/VISTARIL) 50 MG tablet Take 0.5 tablets (25 mg total) by mouth every 8 (eight) hours as needed for anxiety (sleep). Patient not taking: Reported on 07/23/2017 05/12/16   Duffy Bruce, MD  valACYclovir (VALTREX) 1000 MG tablet Take 1 tablet (1,000 mg total) by mouth 2 (two) times daily. 07/23/17   Horald Pollen, MD    Physical Exam: Vitals:   05/04/19 1630 05/04/19 1645 05/04/19 1700 05/04/19 1715  BP: (!) 174/140 (!) 167/137 (!) 168/137 (!) 171/135  Pulse:  (!) 132 (!) 131 (!) 132  Resp: (!) 29 (!) 22 (!) 28 (!) 21  Temp:      TempSrc:      SpO2:  100% 98% 99%  Weight:      Height:        Constitutional:  . Appears calm and comfortable Eyes:  Marland Kitchen Mild pallor. No jaundice.  ENMT:  . external ears, nose appear normal Neck:  . Neck is supple. JVD is equivocal Respiratory:  . Decreased air entry right lung base posteriorly . Respiratory effort normal. No retractions or accessory muscle use Cardiovascular:  . S1S2, tachycardic  . No LE extremity edema   Abdomen:  . Abdomen is soft and non tender. Organs are difficult to assess. Neurologic:  . Awake and alert. . Moves all limbs.  Wt Readings from Last 3 Encounters:  05/04/19 68 kg  08/09/17 61.2 kg  07/23/17 61.2 kg    I have personally reviewed following labs and imaging studies  Labs on Admission:  CBC: Recent Labs  Lab 05/04/19 1540  WBC 6.5  NEUTROABS 4.2  HGB 11.7*  HCT 35.2*  MCV 96.2  PLT 0000000   Basic Metabolic Panel: Recent Labs  Lab 05/04/19 1540  NA 139  K 4.4  CL 108  CO2 21*  GLUCOSE 124*  BUN 19  CREATININE 1.19  CALCIUM 8.7*   Liver Function Tests: Recent Labs  Lab 05/04/19 1540  AST 66*  ALT 46*  ALKPHOS 73  BILITOT 0.9  PROT 6.4*  ALBUMIN 3.4*   No results for input(s): LIPASE, AMYLASE in the last 168 hours. No results for input(s): AMMONIA in the last 168 hours. Coagulation Profile: No results for input(s): INR, PROTIME in the last 168  hours. Cardiac Enzymes: No results for input(s): CKTOTAL, CKMB, CKMBINDEX, TROPONINI in the last 168 hours. BNP (last 3 results) No results for input(s): PROBNP in the last 8760 hours. HbA1C: No results for input(s): HGBA1C in the last 72 hours. CBG: No results for input(s): GLUCAP in the last 168 hours. Lipid Profile: No results for input(s): CHOL, HDL, LDLCALC, TRIG, CHOLHDL, LDLDIRECT in the last 72 hours. Thyroid Function Tests: No results for input(s): TSH, T4TOTAL, FREET4, T3FREE, THYROIDAB in the last 72 hours. Anemia Panel: No results for input(s): VITAMINB12, FOLATE, FERRITIN, TIBC, IRON, RETICCTPCT in the last 72 hours. Urine analysis:    Component Value Date/Time   COLORURINE AMBER (A) 05/04/2019 1600   APPEARANCEUR HAZY (A) 05/04/2019 1600   LABSPEC 1.024 05/04/2019 1600   PHURINE 5.0  05/04/2019 1600   GLUCOSEU NEGATIVE 05/04/2019 1600   HGBUR NEGATIVE 05/04/2019 1600   BILIRUBINUR NEGATIVE 05/04/2019 1600   KETONESUR NEGATIVE 05/04/2019 1600   PROTEINUR 100 (A) 05/04/2019 1600   NITRITE NEGATIVE 05/04/2019 1600   LEUKOCYTESUR LARGE (A) 05/04/2019 1600   Sepsis Labs: @LABRCNTIP (procalcitonin:4,lacticidven:4) )No results found for this or any previous visit (from the past 240 hour(s)).    Radiological Exams on Admission: DG Chest Portable 1 View  Result Date: 05/04/2019 CLINICAL DATA:  Shortness of breath EXAM: PORTABLE CHEST 1 VIEW COMPARISON:  August 09, 2017 FINDINGS: There is moderate cardiomegaly, increased from the prior exam. Mildly hazy airspace opacity seen at the right lung base with a probable small right pleural effusion. No acute osseous abnormality. IMPRESSION: Moderate cardiomegaly, increased from the prior exam, and could be combination of cardiomegaly and/or pericardial effusion. Probable small right pleural effusion and hazy airspace opacity at the right lung base which could be layering effusion/atelectasis. Electronically Signed   By: Prudencio Pair  M.D.   On: 05/04/2019 16:01    EKG: Independently reviewed.   Active Problems:   Atrial flutter with rapid ventricular response (HCC)   Assessment/Plan Atrial flutter with rapid ventricular response: -Admit patient for further assessment and management. -Proceed echocardiogram as planned -Use beta-blocker to control heart rate for now as EF is unknown. -Continue anticoagulation with heparin drip -Optimize heart rate control -Check D-dimer and TSH -Possible ischemic work-up if EF is impaired. -Cardiology team input is appreciated.  CHF, acute, probably systolic/possible cardiomyopathy: -Follow echocardiogram -Continue beta-blocker and diuretics (IV Lasix 40 mg twice daily) -Further management will depend on the echocardiogram findings.  Elevated blood pressure:  -No prior documentation of hypertension -Monitor closely -Continue beta-blocker  History of alcohol abuse: -Patient has been on alcohol alcohol his life. -Patient drinks mainly beer. -Start patient on CIWA protocol.  Pyuria/possible UTI: -Follow urine culture already ordered -Continue IV Rocephin -Further management will depend on urine culture results.  DVT prophylaxis: On heparin drip Code Status: Full code Family Communication:  Disposition Plan: Home eventually Consults called: ER has consulted cardiology Admission status: Inpatient  Time spent: 65 minutes  Dana Allan, MD  [facility] Pager #: [phone] 7PM-7AM contact night coverage as above  05/04/2019, 7:00 PM

## 2019-05-04 NOTE — Progress Notes (Signed)
ANTICOAGULATION CONSULT NOTE - Initial Consult  Pharmacy Consult for Heparin Indication: atrial fibrillation  No Known Allergies  Patient Measurements: Height: 5\' 10"  (177.8 cm) Weight: 150 lb (68 kg) IBW/kg (Calculated) : 73 Heparin Dosing Weight: 68 kg  Vital Signs: Temp: 98.3 F (36.8 C) (02/23 1500) Temp Source: Oral (02/23 1500) BP: 183/147 (02/23 1900) Pulse Rate: 132 (02/23 1900)  Labs: Recent Labs    05/04/19 1540 05/04/19 1719  HGB 11.7*  --   HCT 35.2*  --   PLT 152  --   CREATININE 1.19  --   TROPONINIHS 48* 50*    Estimated Creatinine Clearance: 64.3 mL/min (by C-G formula based on SCr of 1.19 mg/dL).   Medical History: Past Medical History:  Diagnosis Date  . Anxiety   . Arthritis   . Bipolar 1 disorder ([place])   . Depression    Bipolar, not on meds/pt. stopped meds on own over 12 yrs ago  . GERD (gastroesophageal reflux disease)   . History of colon polyps   . Manic disorder ([place])   . Substance abuse ([place])    8yrs ago smoke crack and marjuana  . Tobacco abuse 05/13/2012    Medications:  Scheduled:  . folic acid  1 mg Oral Daily  . furosemide  40 mg Intravenous BID  . heparin  4,000 Units Intravenous Once  . metoprolol tartrate  25 mg Oral Q6H  . multivitamin with minerals  1 tablet Oral Daily  . thiamine  100 mg Oral Daily   Or  . thiamine  100 mg Intravenous Daily   Infusions:  . cefTRIAXone (ROCEPHIN)  IV 1 g (05/04/19 1909)  . heparin      Assessment: Patient is a 39 yom that is being admitted for chest pain and SOB. The patient was found to have CHF and rapid a-flutter. Pharmacy has been asked to dose heparin for the new onset of a-flutter.   Goal of Therapy:  Heparin level 0.3-0.7 units/ml Monitor platelets by anticoagulation protocol: Yes   Plan:  - Heparin bolus of 4000 units IV x 1 dose  - Followed by Heparin Drip @ 1150 units/hr  - Heparin level in 6 hours  - Monitor patient for s/s of bleeding and cbc while on  heparin  Duanne Limerick PharmD. BCPS  05/04/2019,7:18 PM

## 2019-05-04 NOTE — ED Provider Notes (Signed)
Emergency Department Provider Note   I have reviewed the triage vital signs and the nursing notes.   HISTORY  Chief Complaint No chief complaint on file.   HPI Austin Cobb is a 59 y.o. male patient presents to the emergency department for evaluation of multiple symptoms but chiefly among them he is having shortness of breath starting yesterday with generalized weakness.  He denies associated pain.  He states that starting yesterday he felt more short of breath especially while lying flat and trying to sleep.  Symptoms progressed throughout the night and today.  He ultimately called EMS for evaluation.  He denies any prior history of similar symptoms.  He denies fevers, chills but has had some cough/congestion type symptoms as well.  No sick contacts.   Patient is also concerned regarding some dark color to his urine with mild dysuria and is concerned he may have developed a urinary tract infection.  He states he is had this before and this feels similar.  Denies any back or flank pain.  No abdominal discomfort.  Past Medical History:  Diagnosis Date  . Anxiety   . Arthritis   . Bipolar 1 disorder ([place])   . Depression    Bipolar, not on meds/pt. stopped meds on own over 12 yrs ago  . GERD (gastroesophageal reflux disease)   . History of colon polyps   . Manic disorder ([place])   . Substance abuse ([place])    77yrs ago smoke crack and marjuana  . Tobacco abuse 05/13/2012    Patient Active Problem List   Diagnosis Date Noted  . Atrial flutter with rapid ventricular response ([place]) 05/04/2019  . Acute heart failure ([place])   . Essential hypertension   . Herpes zoster without complication 0000000  . History of fall 07/23/2017  . Prostatitis 06/23/2014  . Serrated adenoma of colon 05/13/2012  . Anal fissure 05/13/2012  . Tobacco abuse 05/13/2012  . 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 Chronic shoulder pain 01/03/2012  . Neck pain, chronic 01/03/2012  . Mood disorder  ([place]) 01/03/2012  . Depression with anxiety 01/03/2012    Past Surgical History:  Procedure Laterality Date  . EXAMINATION UNDER ANESTHESIA N/A 11/12/2012   Procedure: EXAM UNDER ANESTHESIA;  Surgeon: Adin Hector, MD;  Location: [facility];  Service: General;  Laterality: N/A;  . EYE SURGERY     bilateral eye sx as a child [age]yrs old.  Joan Mayans N/A 11/12/2012   Procedure: Joan Mayans;  Surgeon: Adin Hector, MD;  Location: [facility];  Service: General;  Laterality: N/A;  . TONSILLECTOMY      Allergies Patient has no known allergies.  Family History  Problem Relation Age of Onset  . Colon polyps Father   . Diabetes Father   . Diabetes Brother     Social History Social History   Tobacco Use  . Smoking status: Current Every Day Smoker    Packs/day: 1.00    Years: 35.00    Pack years: 35.00    Types: Cigarettes  . Smokeless tobacco: Never Used  Substance Use Topics  . Alcohol use: Yes    Alcohol/week: 4.0 standard drinks    Types: 4 Cans of beer per week  . Drug use: Yes    Types: Cocaine, Marijuana    Comment: Quit x6 yrs ago-     Review of Systems  Constitutional: No fever/chills. Positive generalized weakness.  Eyes: No visual changes. ENT: No sore throat. Cardiovascular: Denies chest pain. Respiratory: Positive  shortness of breath and cough/congestion type symptoms.  Gastrointestinal: No abdominal pain. No nausea, no vomiting.  No diarrhea.  No constipation. Genitourinary: Positive for dysuria. Musculoskeletal: Negative for back pain. Skin: Negative for rash. Neurological: Negative for headaches, focal weakness or numbness.  10-point ROS otherwise negative.  ____________________________________________   PHYSICAL EXAM:  VITAL SIGNS: ED Triage Vitals  Enc Vitals Group     BP 05/04/19 1459 (!) 165/119     Pulse Rate 05/04/19 1459 (!) 138     Resp 05/04/19 1459 (!) 35     Temp 05/04/19 1500 98.3 F (36.8 C)     Temp Source 05/04/19 1500 Oral      SpO2 05/04/19 1459 100 %   Constitutional: Alert and oriented. Well appearing and in no acute distress. Eyes: Conjunctivae are normal.  Head: Atraumatic. Nose: No congestion/rhinnorhea. Mouth/Throat: Mucous membranes are moist.  Neck: No stridor.  Cardiovascular: Tachycardia. Good peripheral circulation. Grossly normal heart sounds.   Respiratory: Normal respiratory effort.  No retractions. Lungs diminished at the bases.  Gastrointestinal: Soft and nontender. No distention.  Musculoskeletal: No lower extremity tenderness nor edema. No gross deformities of extremities. Neurologic:  Normal speech and language. No gross focal neurologic deficits are appreciated.  Skin:  Skin is warm, dry and intact. No rash noted.   ____________________________________________   LABS (all labs ordered are listed, but only abnormal results are displayed)  Labs Reviewed  COMPREHENSIVE METABOLIC PANEL - Abnormal; Notable for the following components:      Result Value   CO2 21 (*)    Glucose, Bld 124 (*)    Calcium 8.7 (*)    Total Protein 6.4 (*)    Albumin 3.4 (*)    AST 66 (*)    ALT 46 (*)    All other components within normal limits  LACTIC ACID, PLASMA - Abnormal; Notable for the following components:   Lactic Acid, Venous 2.1 (*)    All other components within normal limits  CBC WITH DIFFERENTIAL/PLATELET - Abnormal; Notable for the following components:   RBC 3.66 (*)    Hemoglobin 11.7 (*)    HCT 35.2 (*)    All other components within normal limits  BRAIN NATRIURETIC PEPTIDE - Abnormal; Notable for the following components:   B Natriuretic Peptide 2,060.2 (*)    All other components within normal limits  URINALYSIS, ROUTINE W REFLEX MICROSCOPIC - Abnormal; Notable for the following components:   Color, Urine AMBER (*)    APPearance HAZY (*)    Protein, ur 100 (*)    Leukocytes,Ua LARGE (*)    WBC, UA >50 (*)    Bacteria, UA MANY (*)    All other components within normal  limits  COMPREHENSIVE METABOLIC PANEL - Abnormal; Notable for the following components:   CO2 21 (*)    Glucose, Bld 156 (*)    Creatinine, Ser 1.25 (*)    Calcium 8.7 (*)    AST 77 (*)    ALT 53 (*)    All other components within normal limits  MAGNESIUM - Abnormal; Notable for the following components:   Magnesium 1.6 (*)    All other components within normal limits  CBC - Abnormal; Notable for the following components:   RBC 3.77 (*)    Hemoglobin 11.8 (*)    HCT 35.3 (*)    All other components within normal limits  TSH - Abnormal; Notable for the following components:   TSH 5.358 (*)    All other  components within normal limits  D-DIMER, QUANTITATIVE (NOT AT [facility]) - Abnormal; Notable for the following components:   D-Dimer, Quant 1.54 (*)    All other components within normal limits  TROPONIN I (HIGH SENSITIVITY) - Abnormal; Notable for the following components:   Troponin I (High Sensitivity) 48 (*)    All other components within normal limits  TROPONIN I (HIGH SENSITIVITY) - Abnormal; Notable for the following components:   Troponin I (High Sensitivity) 50 (*)    All other components within normal limits  URINE CULTURE  SARS CORONAVIRUS 2 (TAT 6-24 HRS)  LACTIC ACID, PLASMA  RAPID URINE DRUG SCREEN, HOSP PERFORMED  PHOSPHORUS  HIV ANTIBODY (ROUTINE TESTING W REFLEX)  BASIC METABOLIC PANEL  CBC  HEPARIN LEVEL (UNFRACTIONATED)  POC SARS CORONAVIRUS 2 AG -  ED   ____________________________________________  EKG   EKG Interpretation  Date/Time:  Tuesday May 04 2019 14:59:11 EST Ventricular Rate:  131 PR Interval:    QRS Duration: 99 QT Interval:  290 QTC Calculation: 429 R Axis:   57 Text Interpretation: Sinus rhythm. Anteroseptal infarct, old Repol abnrm suggests ischemia, diffuse leads Minimal ST elevation, lateral leads No STEMI but changed from 2019 tracing. Confirmed by Nanda Quinton [id number]) on 05/04/2019 3:05:42 PM        ____________________________________________  RADIOLOGY  DG Chest Portable 1 View  Result Date: 05/04/2019 CLINICAL DATA:  Shortness of breath EXAM: PORTABLE CHEST 1 VIEW COMPARISON:  August 09, 2017 FINDINGS: There is moderate cardiomegaly, increased from the prior exam. Mildly hazy airspace opacity seen at the right lung base with a probable small right pleural effusion. No acute osseous abnormality. IMPRESSION: Moderate cardiomegaly, increased from the prior exam, and could be combination of cardiomegaly and/or pericardial effusion. Probable small right pleural effusion and hazy airspace opacity at the right lung base which could be layering effusion/atelectasis. Electronically Signed   By: Prudencio Pair M.D.   On: 05/04/2019 16:01    ____________________________________________   PROCEDURES  Procedure(s) performed:   Procedures  CRITICAL CARE Performed by: Margette Fast Total critical care time: 35 minutes Critical care time was exclusive of separately billable procedures and treating other patients. Critical care was necessary to treat or prevent imminent or life-threatening deterioration. Critical care was time spent personally by me on the following activities: development of treatment plan with patient and/or surrogate as well as nursing, discussions with consultants, evaluation of patient's response to treatment, examination of patient, obtaining history from patient or surrogate, ordering and performing treatments and interventions, ordering and review of laboratory studies, ordering and review of radiographic studies, pulse oximetry and re-evaluation of patient's condition.  Nanda Quinton, MD Emergency Medicine  ____________________________________________   INITIAL IMPRESSION / ASSESSMENT AND PLAN / ED COURSE  Pertinent labs & imaging results that were available during my care of the patient were reviewed by me and considered in my medical decision making (see chart for  details).   Patient presents to the emergency department for evaluation of shortness of breath with congestion type symptoms along with UTI symptoms.  He is afebrile but does have a significant tachycardia up to 131 on arrival with tachypnea.  No hypoxemia.  Some crackles at the bases.  No lower extremity edema. COVID is pending.  Chest x-ray shows cardiomegaly with pleural effusions noted.  They mention possibility for pericardial effusion in the differential.  Will discuss with cardiology.  Patient's BNP is significantly elevated along with mild elevation in troponin and lactate.  Doubt sepsis.  Will  treat UTI with Rocephin and give lasix here.   Spoke with Cardiology who will consult. Will admit for TRH.   Discussed patient's case with TRH to request admission. Patient and family (if present) updated with plan. Care transferred to [facility] service.  I reviewed all nursing notes, vitals, pertinent old records, EKGs, labs, imaging (as available).  ____________________________________________  FINAL CLINICAL IMPRESSION(S) / ED DIAGNOSES  Final diagnoses:  SOB (shortness of breath)  Atrial flutter, unspecified type ([place])    MEDICATIONS GIVEN DURING THIS VISIT:  Medications  metoprolol tartrate (LOPRESSOR) injection 2.5 mg (2.5 mg Intravenous Given 05/04/19 1859)  metoprolol tartrate (LOPRESSOR) tablet 25 mg (25 mg Oral Given 05/04/19 1911)  LORazepam (ATIVAN) tablet 1-4 mg (has no administration in time range)    Or  LORazepam (ATIVAN) injection 1-4 mg (has no administration in time range)  thiamine tablet 100 mg (100 mg Oral Given 05/04/19 2206)    Or  thiamine (B-1) injection 100 mg ( Intravenous See Alternative [id number] 0000000)  folic acid (FOLVITE) tablet 1 mg (1 mg Oral Given 05/04/19 2206)  multivitamin with minerals tablet 1 tablet (1 tablet Oral Given 05/04/19 2207)  cefTRIAXone (ROCEPHIN) 1 g in sodium chloride 0.9 % 100 mL IVPB (has no administration in time range)  heparin ADULT  infusion 100 units/mL (25000 units/266mL sodium chloride 0.45%) (1,150 Units/hr Intravenous New Bag/Given 05/04/19 2050)  furosemide (LASIX) injection 40 mg (has no administration in time range)  LORazepam (ATIVAN) injection 0.5 mg (0.5 mg Intravenous Given 05/04/19 1709)  furosemide (LASIX) injection 40 mg (40 mg Intravenous Given 05/04/19 1859)  cefTRIAXone (ROCEPHIN) 1 g in sodium chloride 0.9 % 100 mL IVPB (0 g Intravenous Stopped 05/04/19 2005)  heparin bolus via infusion 4,000 Units (4,000 Units Intravenous Bolus from Bag 05/04/19 2051)    Note:  This document was prepared using Dragon voice recognition software and may include unintentional dictation errors.  Nanda Quinton, MD, [facility] Emergency Medicine    Ivyanna Sibert, [facility], MD 05/04/19 [phone]

## 2019-05-04 NOTE — ED Triage Notes (Signed)
Progressive weakness over the last month with SOB starting yesterday.  No CP, dizziness, nausea.  + for bed bugs.

## 2019-05-04 NOTE — ED Triage Notes (Signed)
Pt has multiple concerns that he would like to address with the MD today that have been occurring chronically.  Pt  In NAD, resp deep et un labored

## 2019-05-04 NOTE — Consult Note (Addendum)
Cardiology Consultation:   Patient ID: Austin Cobb; [id number]; Apr 07, 1960   Admit date: 05/04/2019 Date of Consult: 05/04/2019  Primary Care Provider: Patient, No Pcp Per Primary Cardiologist: No primary care provider on file. new Primary Electrophysiologist:  None   Patient Profile:   Austin Cobb is a 59 y.o. male with a hx of bipolar (not on rx), GERD, remote cocaine/THC, who is being seen today for the evaluation of CP, SOB at the request of Dr Laverta Baltimore.  History of Present Illness:   Mr. Kosmicki reports a 1 day history of SOB. He has not had chest pain.   He has been dealing with increased fatigue and possibly DOE, but was getting along. However, he was struggling more with errands such as going to the store. Getting very tired and his activity level was definitely decreased. He denies LE edema, orthopnea or PND until last pm.   He went to the store yesterday and was exhausted. He got a ride home, otherwise did not think he would have made it.   Last pm, he went to bed but could not sleep. Describes orthopnea and PND. No real palpitations, but did feel his heart pound at times. SOB with any exertion.  He came to the ER, where he is in CHF and rapid atrial flutter. He has never been diagnosed with either.     Past Medical History:  Diagnosis Date  . Anxiety   . Arthritis   . Bipolar 1 disorder ([place])   . Depression    Bipolar, not on meds/pt. stopped meds on own over 12 yrs ago  . GERD (gastroesophageal reflux disease)   . History of colon polyps   . Manic disorder ([place])   . Substance abuse ([place])    61yrs ago smoke crack and marjuana  . Tobacco abuse 05/13/2012    Past Surgical History:  Procedure Laterality Date  . EXAMINATION UNDER ANESTHESIA N/A 11/12/2012   Procedure: EXAM UNDER ANESTHESIA;  Surgeon: Adin Hector, MD;  Location: [facility];  Service: General;  Laterality: N/A;  . EYE SURGERY     bilateral eye sx as a child 6-61yrs old.  Joan Mayans N/A  11/12/2012   Procedure: Joan Mayans;  Surgeon: Adin Hector, MD;  Location: [facility];  Service: General;  Laterality: N/A;  . TONSILLECTOMY       Prior to Admission medications   Medication Sig Start Date End Date Taking? Authorizing Provider  aspirin 81 MG tablet Take 81 mg by mouth 2 (two) times daily.    Yes [provider]  Diphenhydramine-APAP, sleep, (TYLENOL PM EXTRA STRENGTH PO) Take by mouth. Take 2 at bedtime   Yes [provider]  OVER THE COUNTER MEDICATION Apply 1 application topically as needed (for itching).   Yes [provider]  hydrOXYzine (ATARAX/VISTARIL) 50 MG tablet Take 0.5 tablets (25 mg total) by mouth every 8 (eight) hours as needed for anxiety (sleep). Patient not taking: Reported on 07/23/2017 05/12/16   Duffy Bruce, MD  valACYclovir (VALTREX) 1000 MG tablet Take 1 tablet (1,000 mg total) by mouth 2 (two) times daily. 07/23/17   Horald Pollen, MD    Inpatient Medications: Scheduled Meds: . furosemide  40 mg Intravenous Once   Continuous Infusions: . cefTRIAXone (ROCEPHIN)  IV     PRN Meds:   Allergies:   No Known Allergies  Social History:   Social History   Socioeconomic History  . Marital status: Divorced    Spouse name: Not on  file  . Number of children: Not on file  . Years of education: Not on file  . Highest education level: Not on file  Occupational History  . Not on file  Tobacco Use  . Smoking status: Current Every Day Smoker    Packs/day: 1.00    Years: 35.00    Pack years: 35.00    Types: Cigarettes  . Smokeless tobacco: Never Used  Substance and Sexual Activity  . Alcohol use: Yes    Alcohol/week: 4.0 standard drinks    Types: 4 Cans of beer per week  . Drug use: Yes    Types: Cocaine, Marijuana    Comment: Quit x6 yrs ago-   . Sexual activity: Never  Other Topics Concern  . Not on file  Social History Narrative  . Not on file   Social Determinants of Health   Financial Resource  Strain:   . Difficulty of Paying Living Expenses: Not on file  Food Insecurity:   . Worried About Charity fundraiser in the Last Year: Not on file  . Ran Out of Food in the Last Year: Not on file  Transportation Needs:   . Lack of Transportation (Medical): Not on file  . Lack of Transportation (Non-Medical): Not on file  Physical Activity:   . Days of Exercise per Week: Not on file  . Minutes of Exercise per Session: Not on file  Stress:   . Feeling of Stress : Not on file  Social Connections:   . Frequency of Communication with Friends and Family: Not on file  . Frequency of Social Gatherings with Friends and Family: Not on file  . Attends Religious Services: Not on file  . Active Member of Clubs or Organizations: Not on file  . Attends Archivist Meetings: Not on file  . Marital Status: Not on file  Intimate Partner Violence:   . Fear of Current or Ex-Partner: Not on file  . Emotionally Abused: Not on file  . Physically Abused: Not on file  . Sexually Abused: Not on file    Family History:   Family History  Problem Relation Age of Onset  . Colon polyps Father   . Diabetes Father   . Diabetes Brother    Family Status:  Family Status  Relation Name Status  . Father  Deceased  . Brother  Alive  . Mother  Deceased  . Sister  Alive    ROS:  Please see the history of present illness.  All other ROS reviewed and negative.     Physical Exam/Data:   Vitals:   05/04/19 1630 05/04/19 1645 05/04/19 1700 05/04/19 1715  BP: (!) 174/140 (!) 167/137 (!) 168/137 (!) 171/135  Pulse:  (!) 132 (!) 131 (!) 132  Resp: (!) 29 (!) 22 (!) 28 (!) 21  Temp:      TempSrc:      SpO2:  100% 98% 99%  Weight:      Height:       No intake or output data in the 24 hours ending 05/04/19 1753 Filed Weights   05/04/19 1524  Weight: 68 kg   Body mass index is 21.52 kg/m.  General:  Well nourished, well developed, male in no acute distress HEENT: normal Lymph: no  adenopathy Neck: JVD - to jaw Endocrine:  No thryomegaly Vascular: No carotid bruits; 4/4 extremity pulses 2+  Cardiac:  normal S1, S2; RRR; no murmur Lungs:  Rales bilaterally, no wheezing, rhonchi  Abd: soft, nontender, no hepatomegaly  Ext: no edema Musculoskeletal:  No deformities, BUE and BLE strength normal and equal Skin: warm and dry  Neuro:  CNs 2-12 intact, no focal abnormalities noted Psych:  Normal affect   EKG:  The EKG was personally reviewed and demonstrates:  02/23 ECG is atrial flutter, HR 131, T wave inversions are new Telemetry:  Telemetry was personally reviewed and demonstrates:  Atrial flutter, RVR   CV studies:   None   Laboratory Data:   Chemistry Recent Labs  Lab 05/04/19 1540  NA 139  K 4.4  CL 108  CO2 21*  GLUCOSE 124*  BUN 19  CREATININE 1.19  CALCIUM 8.7*  GFRNONAA >60  GFRAA >60  ANIONGAP 10    Lab Results  Component Value Date   ALT 46 (H) 05/04/2019   AST 66 (H) 05/04/2019   ALKPHOS 73 05/04/2019   BILITOT 0.9 05/04/2019   Hematology Recent Labs  Lab 05/04/19 1540  WBC 6.5  RBC 3.66*  HGB 11.7*  HCT 35.2*  MCV 96.2  MCH 32.0  MCHC 33.2  RDW 14.2  PLT 152   Cardiac Enzymes High Sensitivity Troponin:   Recent Labs  Lab 05/04/19 1540  TROPONINIHS 48*      BNP Recent Labs  Lab 05/04/19 1540  BNP 2,060.2*    DDimer No results for input(s): DDIMER in the last 168 hours. TSH: No results found for: TSH Lipids:No results found for: CHOL, HDL, LDLCALC, LDLDIRECT, TRIG, CHOLHDL HgbA1c:No results found for: HGBA1C Magnesium:  Magnesium  Date Value Ref Range Status  08/09/2017 2.1 1.7 - 2.4 mg/dL Final    Comment:    Performed at [facility], [place] [address], [place], [place] [address]     Radiology/Studies:  DG Chest Portable 1 View  Result Date: 05/04/2019 CLINICAL DATA:  Shortness of breath EXAM: PORTABLE CHEST 1 VIEW COMPARISON:  August 09, 2017 FINDINGS: There is moderate cardiomegaly, increased  from the prior exam. Mildly hazy airspace opacity seen at the right lung base with a probable small right pleural effusion. No acute osseous abnormality. IMPRESSION: Moderate cardiomegaly, increased from the prior exam, and could be combination of cardiomegaly and/or pericardial effusion. Probable small right pleural effusion and hazy airspace opacity at the right lung base which could be layering effusion/atelectasis. Electronically Signed   By: Prudencio Pair M.D.   On: 05/04/2019 16:01    Assessment and Plan:   1. Acute CHF - Admit, diurese, suspect S-CHF - ck echo - continue to cycle ez - if EF down, will need ischemic eval  2. Atrial flutter, RVR - rapid rate - new dx - use BB to slow HR, not Cardizem because of probable LVD - start heparin - will need DOAC when invasive eval completed   3. Acute hypoxic respiratory failure: likely 2/2 systolic heart failure as above -Diuresis  Active Problems:   * No active hospital problems. *   For questions or updates, please contact [place] Please consult [email or web address] for contact info under Cardiology/STEMI.   Jonetta Speak, PA-C  05/04/2019 5:53 PM   Patient seen and examined.  Agree with above documentation.  Mr. Demarce is a 59 year old male with history of BPD, GERD, remote cocaine/marijuana use.  Cardiology is consulted for evaluation of suspected acute heart failure at the request of Dr. Laverta Baltimore.  Patient reports that he has had shortness of breath for the last day.  States that over the last day has been struggling with  his daily activities and feeling very tired.  Denies any exertional chest pain.  On presentation to the ED, noted to be hypertensive to 165/119, heart rate 130s.  EKG personally reviewed, shows atrial flutter with 2:1 conduction, rate 131, Q waves in V1-2,aVL, T wave inversions in V4-6, 1 mm ST depression in leads II, III, aVF.  Telemetry shows atrial flutter with rate 130s.  Labs notable for creatinine  1.2, BNP 2068, high-sensitivity troponin 48->50, lactate 2.1->1.7, mild transaminitis (AST 66, ALT 46),UA with >50 WBCs.  On exam, patient is alert and oriented, tachycardic, regular rhythm, no murmurs, bibasilar crackles, no LE edema, +JVD.   For his atrial flutter, will start on p.o. metoprolol for rate control.  Can start Lopressor 25 mg every 6 hours and titrate as needed.  Would start heparin drip for anticoagulation.  Will check TTE.  He is volume overloaded, suspect acute systolic heart failure.  Will diurese with IV Lasix 40 mg twice daily.  Donato Heinz, MD

## 2019-05-04 NOTE — ED Notes (Signed)
ot calling out with c/o intermittent CP and difficulty breathing.  Slightly anxious and requesting something to calm his nerves.  MD notified.  VS remain unchanged and stable.

## 2019-05-05 ENCOUNTER — Inpatient Hospital Stay (HOSPITAL_COMMUNITY): Payer: Medicare PPO

## 2019-05-05 ENCOUNTER — Encounter (HOSPITAL_COMMUNITY): Payer: Self-pay | Admitting: Internal Medicine

## 2019-05-05 DIAGNOSIS — R748 Abnormal levels of other serum enzymes: Secondary | ICD-10-CM

## 2019-05-05 DIAGNOSIS — R7989 Other specified abnormal findings of blood chemistry: Secondary | ICD-10-CM

## 2019-05-05 DIAGNOSIS — B9620 Unspecified Escherichia coli [E. coli] as the cause of diseases classified elsewhere: Secondary | ICD-10-CM

## 2019-05-05 DIAGNOSIS — R5381 Other malaise: Secondary | ICD-10-CM

## 2019-05-05 DIAGNOSIS — F17200 Nicotine dependence, unspecified, uncomplicated: Secondary | ICD-10-CM

## 2019-05-05 DIAGNOSIS — R531 Weakness: Secondary | ICD-10-CM

## 2019-05-05 DIAGNOSIS — I5043 Acute on chronic combined systolic (congestive) and diastolic (congestive) heart failure: Secondary | ICD-10-CM

## 2019-05-05 LAB — HEMOGLOBIN A1C
Hgb A1c MFr Bld: 5.3 % (ref 4.8–5.6)
Mean Plasma Glucose: 105.41 mg/dL

## 2019-05-05 LAB — CBC
HCT: 37.2 % — ABNORMAL LOW (ref 39.0–52.0)
Hemoglobin: 12.1 g/dL — ABNORMAL LOW (ref 13.0–17.0)
MCH: 31.1 pg (ref 26.0–34.0)
MCHC: 32.5 g/dL (ref 30.0–36.0)
MCV: 95.6 fL (ref 80.0–100.0)
Platelets: 170 10*3/uL (ref 150–400)
RBC: 3.89 MIL/uL — ABNORMAL LOW (ref 4.22–5.81)
RDW: 14.3 % (ref 11.5–15.5)
WBC: 6.2 10*3/uL (ref 4.0–10.5)
nRBC: 0 % (ref 0.0–0.2)

## 2019-05-05 LAB — ECHOCARDIOGRAM COMPLETE
Height: 69 in
Weight: 2151.69 oz

## 2019-05-05 LAB — HEPATITIS PANEL, ACUTE
HCV Ab: NONREACTIVE
Hep A IgM: NONREACTIVE
Hep B C IgM: NONREACTIVE
Hepatitis B Surface Ag: NONREACTIVE

## 2019-05-05 LAB — MAGNESIUM: Magnesium: 1.5 mg/dL — ABNORMAL LOW (ref 1.7–2.4)

## 2019-05-05 LAB — LIPID PANEL
Cholesterol: 76 mg/dL (ref 0–200)
HDL: 33 mg/dL — ABNORMAL LOW (ref >40)
LDL Cholesterol: 35 mg/dL (ref 0–99)
Total CHOL/HDL Ratio: 2.3 RATIO
Triglycerides: 41 mg/dL (ref <150)
VLDL: 8 mg/dL (ref 0–40)

## 2019-05-05 LAB — BASIC METABOLIC PANEL
Anion gap: 12 (ref 5–15)
BUN: 20 mg/dL (ref 6–20)
CO2: 22 mmol/L (ref 22–32)
Calcium: 8.9 mg/dL (ref 8.9–10.3)
Chloride: 104 mmol/L (ref 98–111)
Creatinine, Ser: 1.42 mg/dL — ABNORMAL HIGH (ref 0.61–1.24)
GFR calc Af Amer: >60 mL/min (ref >60)
GFR calc non Af Amer: 54 mL/min — ABNORMAL LOW (ref >60)
Glucose, Bld: 135 mg/dL — ABNORMAL HIGH (ref 70–99)
Potassium: 3.9 mmol/L (ref 3.5–5.1)
Sodium: 138 mmol/L (ref 135–145)

## 2019-05-05 LAB — CK: Total CK: 116 U/L (ref 49–397)

## 2019-05-05 LAB — GLUCOSE, CAPILLARY
Glucose-Capillary: 103 mg/dL — ABNORMAL HIGH (ref 70–99)
Glucose-Capillary: 122 mg/dL — ABNORMAL HIGH (ref 70–99)

## 2019-05-05 LAB — T4, FREE: Free T4: 1.68 ng/dL — ABNORMAL HIGH (ref 0.61–1.12)

## 2019-05-05 LAB — HEPARIN LEVEL (UNFRACTIONATED)
Heparin Unfractionated: 0.75 IU/mL — ABNORMAL HIGH (ref 0.30–0.70)
Heparin Unfractionated: 0.62 IU/mL (ref 0.30–0.70)

## 2019-05-05 LAB — SARS CORONAVIRUS 2 (TAT 6-24 HRS): SARS Coronavirus 2: NEGATIVE

## 2019-05-05 MED ORDER — IOHEXOL 350 MG/ML SOLN
80.0000 mL | Freq: Once | INTRAVENOUS | Status: AC | PRN
  Administered 2019-05-05: 80 mL via INTRAVENOUS

## 2019-05-05 MED ORDER — APIXABAN 5 MG PO TABS
5.0000 mg | ORAL_TABLET | Freq: Two times a day (BID) | ORAL | Status: DC
  Administered 2019-05-05 – 2019-05-06 (×4): 5 mg via ORAL
  Filled 2019-05-05 (×4): qty 1

## 2019-05-05 MED ORDER — MAGNESIUM SULFATE 2 GM/50ML IV SOLN
2.0000 g | Freq: Once | INTRAVENOUS | Status: AC
  Administered 2019-05-05: 2 g via INTRAVENOUS
  Filled 2019-05-05: qty 50

## 2019-05-05 NOTE — Anesthesia Preprocedure Evaluation (Addendum)
Anesthesia Evaluation  Patient identified by MRN, date of birth, ID band Patient awake    Reviewed: Allergy & Precautions, NPO status , Patient's Chart, lab work & pertinent test results  Airway Mallampati: II  TM Distance: >3 FB Neck ROM: Full    Dental no notable dental hx. (+) Dental Advisory Given, Poor Dentition   Pulmonary Current Smoker,    Pulmonary exam normal breath sounds clear to auscultation       Cardiovascular hypertension, Pt. on medications and Pt. on home beta blockers +CHF  Normal cardiovascular exam+ dysrhythmias Atrial Fibrillation  Rhythm:Irregular Rate:Abnormal  05/05/19 Echo  EF 10-15%!   Neuro/Psych PSYCHIATRIC DISORDERS Bipolar Disorder    GI/Hepatic GERD  ,(+)     substance abuse  ,   Endo/Other  negative endocrine ROS  Renal/GU Cr 1.42 K+ 3.9     Musculoskeletal   Abdominal   Peds  Hematology Hgb 12.9   Anesthesia Other Findings   Reproductive/Obstetrics                            Anesthesia Physical Anesthesia Plan  ASA: IV  Anesthesia Plan: MAC   Post-op Pain Management:    Induction: Intravenous  PONV Risk Score and Plan: Treatment may vary due to age or medical condition  Airway Management Planned: Nasal Cannula and Natural Airway  Additional Equipment: None  Intra-op Plan:   Post-operative Plan:   Informed Consent: I have reviewed the patients History and Physical, chart, labs and discussed the procedure including the risks, benefits and alternatives for the proposed anesthesia with the patient or authorized representative who has indicated his/her understanding and acceptance.     Dental advisory given  Plan Discussed with: CRNA  Anesthesia Plan Comments:        Anesthesia Quick Evaluation

## 2019-05-05 NOTE — Progress Notes (Signed)
PROGRESS NOTE  Austin Cobb [id number] DOB: Apr 28, 1960   PCP: Patient, No Pcp Per  Patient is from: Home.  Lives with a friend.  Ambulates independently at baseline.  DOA: 05/04/2019 LOS: 1  Brief Narrative / Interim history: 59 year old male with history of tobacco, EtOH and polysubstance abuse, bipolar disorder, anxiety and depression presenting with progressive dyspnea for 1 day and exertional dyspnea and fatigue for over 2 months.  Also had paroxysmal nocturnal dyspnea.   In ED, found to be in A.  Flutter with RVR with HR in the 130s.  Also hypertensive with SBP in 170s and DBP in 130s.  UDS negative.  Mildly elevated AST and ALT.BNP 2000.  High-sensitivity troponin 48> 50.  EKG revealed a flutter and LVH.  Lactic acid 2.1> 1.7.  Hgb 11.7.  UA with large LE and many bacteria.  POC COVID-19 test negative.  Cardiology consulted.  Patient admitted for atrial flutter with RVR and unknown type acute CHF.  D-dimer and COVID-19 PCR ordered.  Subjective: No major events overnight or this morning.  Feels a little better from breathing standpoint.  He denies chest pain.  Feels weak.  Does not seem to have orthopnea.  He is lying flat.  Denies GI symptoms.  Has discomfort with urination for over 2 years.  Denies acute change.  He denies fever or chills.  Remains tachycardic with heart rate in 120s.  Blood pressure remains elevated.  D-dimer elevated to 1.54.   Objective: Vitals:   05/04/19 2035 05/04/19 2040 05/04/19 2100 05/05/19 0500  BP:  (!) 155/116  (!) 150/103  Pulse:  (!) 130 (!) 129 (!) 128  Resp:  18  18  Temp:  (!) 97.4 F (36.3 C)  98 F (36.7 C)  TempSrc:  Oral  Oral  SpO2:  100% 92% 100%  Weight: 61.6 kg  61.6 kg 61 kg  Height: 5\' 9"  (1.753 m)  5\' 9"  (1.753 m)     Intake/Output Summary (Last 24 hours) at 05/05/2019 1357 Last data filed at 05/05/2019 0600 Gross per 24 hour  Intake 957.79 ml  Output 350 ml  Net 607.79 ml   Filed Weights   05/04/19 2035 05/04/19 2100  05/05/19 0500  Weight: 61.6 kg 61.6 kg 61 kg    Examination:  GENERAL: No acute distress.  Appears well.  Lying flat in bed. HEENT: MMM.  Vision and hearing grossly intact.  NECK: Supple.  Prominent JVD to his jaw. RESP:  No IWOB.  Fair aeration bilaterally.  No crackles. CVS: HR in 120s.  Regular rhythm.. Heart sounds normal.  ABD/GI/GU: Bowel sounds present. Soft. Non tender.  MSK/EXT:  Moves extremities. No apparent deformity. No edema or calf tenderness. SKIN: no apparent skin lesion or wound NEURO: Awake, alert and oriented appropriately.  No apparent focal neuro deficit. PSYCH: Calm. Normal affect.  Procedures:  None  Assessment & Plan: Atrial flutter with RVR: HR remains in 120s to 130s despite metoprolol.  CHA2DS2-VASc score of 2 -Plan for TEE/DCCV on 2/25 per cardiology -Continue metoprolol and Eliquis per cardiology  Acute CHF: Unknown type.  Presented with cardinal symptoms.  BNP 2000.  LVH on EKG.  CXR with cardiomegaly and small right pleural effusion.  Has prominent JVD on exam.  CTA shows bilateral pleural effusion.  Intake and output incomplete.  Only 350 cc overnight documented.  Creatinine slightly trended up from 1.19-1.42. -Follow Echo -On IV Lasix 40 mg twice daily per cardiology -Monitor fluid status, renal function and lytes  Elevated d-dimer: CTA negative for PE but moderate bilateral pleural effusions, right > left.   Elevated troponin: Likely demand ischemia from a flutter and CHF. -Cardiac work-up as above  Elevated liver enzymes: Likely due to congestive hepatopathy. -Check CK and acute hepatitis panel -Manage CHF as above  Hypomagnesemia: -Replenish and recheck.  E. coli UTI: Patient have symptoms although chronic.  UA and urine culture concerning. -Continue ceftriaxone pending urine culture sensitivity.  Essential hypertension: BP elevated. -Cardiac meds as above  History of polysubstance use -Cessation counseling -Consult CSW for  resources  Tobacco use disorder -Encourage cessation -Nicotine patch  Alcohol use disorder -Continue CIWA with as needed Ativan -Continue vitamins  Generalized weakness/debility -PT/OT eval  Euthyroid sick syndrome: TSH 5.4.  Free T4 1.68 -Repeat thyroid panel in 4 to 6 weeks outpatient.                DVT prophylaxis: On Eliquis for anticoagulation Code Status: Full code Family Communication: Patient and/or RN. Available if any question.  Discharge barrier: Remains in a flutter with RVR. Also with dyspnea and generalized weakness.  Plan for TEE/DCCV by cardiology on 2/25. Patient is from: Home.  Lives with friend. Final disposition: To be determined based on clinical progress  Consultants: Cardiology   Microbiology summarized: [id number] negative.  Sch Meds:  Scheduled Meds: . apixaban  5 mg Oral BID  . folic acid  1 mg Oral Daily  . furosemide  40 mg Intravenous BID  . metoprolol tartrate  25 mg Oral Q6H  . multivitamin with minerals  1 tablet Oral Daily  . thiamine  100 mg Oral Daily   Or  . thiamine  100 mg Intravenous Daily   Continuous Infusions: . cefTRIAXone (ROCEPHIN)  IV     PRN Meds:.LORazepam **OR** LORazepam, metoprolol tartrate  Antimicrobials: Anti-infectives (From admission, onward)   Start     Dose/Rate Route Frequency Ordered Stop   05/05/19 1900  cefTRIAXone (ROCEPHIN) 1 g in sodium chloride 0.9 % 100 mL IVPB     1 g 200 mL/hr over 30 Minutes Intravenous Every 24 hours 05/04/19 2110     05/04/19 1730  cefTRIAXone (ROCEPHIN) 1 g in sodium chloride 0.9 % 100 mL IVPB     1 g 200 mL/hr over 30 Minutes Intravenous  Once 05/04/19 1724 05/04/19 2005       I have personally reviewed the following labs and images: CBC: Recent Labs  Lab 05/04/19 1540 05/04/19 2144 05/05/19 0130  WBC 6.5 5.8 6.2  NEUTROABS 4.2  --   --   HGB 11.7* 11.8* 12.1*  HCT 35.2* 35.3* 37.2*  MCV 96.2 93.6 95.6  PLT 152 167 170   BMP &GFR Recent Labs   Lab 05/04/19 1540 05/04/19 2144 05/05/19 0130 05/05/19 1006  NA 139 139 138  --   K 4.4 4.1 3.9  --   CL 108 105 104  --   CO2 21* 21* 22  --   GLUCOSE 124* 156* 135*  --   BUN 19 20 20   --   CREATININE 1.19 1.25* 1.42*  --   CALCIUM 8.7* 8.7* 8.9  --   MG  --  1.6*  --  1.5*  PHOS  --  4.2  --   --    Estimated Creatinine Clearance: 48.3 mL/min (A) (by C-G formula based on SCr of 1.42 mg/dL (H)). Liver & Pancreas: Recent Labs  Lab 05/04/19 1540 05/04/19 2144  AST 66* 77*  ALT 46* 53*  ALKPHOS 73 76  BILITOT 0.9 1.1  PROT 6.4* 6.5  ALBUMIN 3.4* 3.5   No results for input(s): LIPASE, AMYLASE in the last 168 hours. No results for input(s): AMMONIA in the last 168 hours. Diabetic: Recent Labs    05/05/19 1006  HGBA1C 5.3   No results for input(s): GLUCAP in the last 168 hours. Cardiac Enzymes: No results for input(s): CKTOTAL, CKMB, CKMBINDEX, TROPONINI in the last 168 hours. No results for input(s): PROBNP in the last 8760 hours. Coagulation Profile: No results for input(s): INR, PROTIME in the last 168 hours. Thyroid Function Tests: Recent Labs    05/04/19 2144 05/05/19 1006  TSH 5.358*  --   FREET4  --  1.68*   Lipid Profile: Recent Labs    05/05/19 1006  CHOL 76  HDL 33*  LDLCALC 35  TRIG 41  CHOLHDL 2.3   Anemia Panel: No results for input(s): VITAMINB12, FOLATE, FERRITIN, TIBC, IRON, RETICCTPCT in the last 72 hours. Urine analysis:    Component Value Date/Time   COLORURINE AMBER (A) 05/04/2019 1600   APPEARANCEUR HAZY (A) 05/04/2019 1600   LABSPEC 1.024 05/04/2019 1600   PHURINE 5.0 05/04/2019 1600   GLUCOSEU NEGATIVE 05/04/2019 1600   HGBUR NEGATIVE 05/04/2019 1600   BILIRUBINUR NEGATIVE 05/04/2019 1600   KETONESUR NEGATIVE 05/04/2019 1600   PROTEINUR 100 (A) 05/04/2019 1600   NITRITE NEGATIVE 05/04/2019 1600   LEUKOCYTESUR LARGE (A) 05/04/2019 1600   Sepsis Labs: Invalid input(s): PROCALCITONIN, [place]  Microbiology: Recent  Results (from the past 240 hour(s))  Urine culture     Status: Abnormal (Preliminary result)   Collection Time: 05/04/19  3:47 PM   Specimen: Urine, Clean Catch  Result Value Ref Range Status   Specimen Description URINE, CLEAN CATCH  Final   Special Requests NONE  Final   Culture (A)  Final    >=100,000 COLONIES/mL ESCHERICHIA COLI SUSCEPTIBILITIES TO FOLLOW Performed at [facility], [address], [place], [place] [address]    Report Status PENDING  Incomplete  SARS CORONAVIRUS 2 (TAT 6-24 HRS) Nasopharyngeal Nasopharyngeal Swab     Status: None   Collection Time: 05/05/19  8:06 AM   Specimen: Nasopharyngeal Swab  Result Value Ref Range Status   SARS Coronavirus 2 NEGATIVE NEGATIVE Final    Comment: (NOTE) SARS-CoV-2 target nucleic acids are NOT DETECTED. The SARS-CoV-2 RNA is generally detectable in upper and lower respiratory specimens during the acute phase of infection. Negative results do not preclude SARS-CoV-2 infection, do not rule out co-infections with other pathogens, and should not be used as the sole basis for treatment or other patient management decisions. Negative results must be combined with clinical observations, patient history, and epidemiological information. The expected result is Negative. Fact Sheet for Patients: SugarRoll.be Fact Sheet for Healthcare Providers: https://www.woods-mathews.com/ This test is not yet approved or cleared by the [place] FDA and  has been authorized for detection and/or diagnosis of SARS-CoV-2 by FDA under an Emergency Use Authorization (EUA). This EUA will remain  in effect (meaning this test can be used) for the duration of the COVID-19 declaration under Section 56 4(b)(1) of the Act, 21 U.S.C. section 360bbb-3(b)(1), unless the authorization is terminated or revoked sooner. Performed at [facility], [place] [address], [place], [place] [address]     Radiology  Studies: CT ANGIO CHEST PE W OR WO CONTRAST  Result Date: 05/05/2019 CLINICAL DATA:  Increasing shortness of breath for 1 day. Chest pain. EXAM: CT ANGIOGRAPHY CHEST  WITH CONTRAST TECHNIQUE: Multidetector CT imaging of the chest was performed using the standard protocol during bolus administration of intravenous contrast. Multiplanar CT image reconstructions and MIPs were obtained to evaluate the vascular anatomy. CONTRAST:  80 mL OMNIPAQUE IOHEXOL 350 MG/ML SOLN COMPARISON:  Single-view of the chest 05/04/2019. CT chest 05/12/2016. FINDINGS: Cardiovascular: Distal segmental branches are not opacified. No pulmonary embolus is identified. There is marked cardiomegaly. No pericardial effusion. Scattered aortic atherosclerotic calcifications are present. Mediastinum/Nodes: No enlarged mediastinal, hilar, or axillary lymph nodes. Thyroid gland, trachea, and esophagus demonstrate no significant findings. Lungs/Pleura: Moderate bilateral pleural effusions are seen, larger on the right. Scattered mild ground-glass attenuation is likely due to atelectasis. No consolidative process, nodule, mass or pneumothorax. Upper Abdomen: Negative. Musculoskeletal: No acute or focal abnormality. Review of the MIP images confirms the above findings. IMPRESSION: Negative for pulmonary embolus. As noted above, the distal segmental branches are not opacified with contrast. Marked cardiomegaly. Moderate bilateral pleural effusions, larger on the right. Aortic Atherosclerosis (ICD10-I70.0). Electronically Signed   By: Inge Rise M.D.   On: 05/05/2019 13:46   DG Chest Portable 1 View  Result Date: 05/04/2019 CLINICAL DATA:  Shortness of breath EXAM: PORTABLE CHEST 1 VIEW COMPARISON:  August 09, 2017 FINDINGS: There is moderate cardiomegaly, increased from the prior exam. Mildly hazy airspace opacity seen at the right lung base with a probable small right pleural effusion. No acute osseous abnormality. IMPRESSION: Moderate  cardiomegaly, increased from the prior exam, and could be combination of cardiomegaly and/or pericardial effusion. Probable small right pleural effusion and hazy airspace opacity at the right lung base which could be layering effusion/atelectasis. Electronically Signed   By: Prudencio Pair M.D.   On: 05/04/2019 16:01      Whitnie Deleon T. Penrose  If 7PM-7AM, please contact night-coverage [email or web address] Password [facility] 05/05/2019, 1:57 PM

## 2019-05-05 NOTE — H&P (View-Only) (Signed)
Progress Note  Patient Name: Austin Cobb Date of Encounter: 05/05/2019  Primary Cardiologist: Donato Heinz, MD   Subjective   Patient reports improvement in SOB and orthopnea this morning. Feeling a little on edge. He is attempting to pull his IV tubing apart when I entered the room because it is confining him. Has not had a drink since Sunday. No complaints of chest pain. He is unaware of his atrial flutter with rapid rates. Updated his roommate Austin Cobb via phone.   Inpatient Medications    Scheduled Meds: . apixaban  5 mg Oral BID  . folic acid  1 mg Oral Daily  . furosemide  40 mg Intravenous BID  . metoprolol tartrate  25 mg Oral Q6H  . multivitamin with minerals  1 tablet Oral Daily  . thiamine  100 mg Oral Daily   Or  . thiamine  100 mg Intravenous Daily   Continuous Infusions: . cefTRIAXone (ROCEPHIN)  IV     PRN Meds: LORazepam **OR** LORazepam, metoprolol tartrate   Vital Signs    Vitals:   05/04/19 2035 05/04/19 2040 05/04/19 2100 05/05/19 0500  BP:  (!) 155/116  (!) 150/103  Pulse:  (!) 130 (!) 129 (!) 128  Resp:  18  18  Temp:  (!) 97.4 F (36.3 C)  98 F (36.7 C)  TempSrc:  Oral  Oral  SpO2:  100% 92% 100%  Weight: 61.6 kg  61.6 kg 61 kg  Height: 5\' 9"  (1.753 m)  5\' 9"  (1.753 m)     Intake/Output Summary (Last 24 hours) at 05/05/2019 1152 Last data filed at 05/05/2019 0600 Gross per 24 hour  Intake 957.79 ml  Output 350 ml  Net 607.79 ml   Filed Weights   05/04/19 2035 05/04/19 2100 05/05/19 0500  Weight: 61.6 kg 61.6 kg 61 kg    Telemetry    Atrial flutter with predominately 2:1 AV block, rates in the 120s-130s.  - Personally Reviewed  ECG    No new tracings - Personally Reviewed  Physical Exam   GEN: No acute distress.   Neck: unable to assess JVD due to patient cooperation Cardiac: IRIR, no murmurs, rubs, or gallops.  Respiratory: Clear to auscultation bilaterally, no wheezes/ rales/ rhonchi GI: NABS, Soft,  nontender, non-distended  MS: No edema; No deformity. Neuro:  Nonfocal, moving all extremities spontaneously Psych: Anxious   Labs    Chemistry Recent Labs  Lab 05/04/19 1540 05/04/19 2144 05/05/19 0130  NA 139 139 138  K 4.4 4.1 3.9  CL 108 105 104  CO2 21* 21* 22  GLUCOSE 124* 156* 135*  BUN 19 20 20   CREATININE 1.19 1.25* 1.42*  CALCIUM 8.7* 8.7* 8.9  PROT 6.4* 6.5  --   ALBUMIN 3.4* 3.5  --   AST 66* 77*  --   ALT 46* 53*  --   ALKPHOS 73 76  --   BILITOT 0.9 1.1  --   GFRNONAA >60 >60 54*  GFRAA >60 >60 >60  ANIONGAP 10 13 12      Hematology Recent Labs  Lab 05/04/19 1540 05/04/19 2144 05/05/19 0130  WBC 6.5 5.8 6.2  RBC 3.66* 3.77* 3.89*  HGB 11.7* 11.8* 12.1*  HCT 35.2* 35.3* 37.2*  MCV 96.2 93.6 95.6  MCH 32.0 31.3 31.1  MCHC 33.2 33.4 32.5  RDW 14.2 14.0 14.3  PLT 152 167 170    Cardiac EnzymesNo results for input(s): TROPONINI in the last 168 hours. No results for input(s):  TROPIPOC in the last 168 hours.   BNP Recent Labs  Lab 05/04/19 1540  BNP 2,060.2*     DDimer  Recent Labs  Lab 05/04/19 2144  DDIMER 1.54*     Radiology    DG Chest Portable 1 View  Result Date: 05/04/2019 CLINICAL DATA:  Shortness of breath EXAM: PORTABLE CHEST 1 VIEW COMPARISON:  August 09, 2017 FINDINGS: There is moderate cardiomegaly, increased from the prior exam. Mildly hazy airspace opacity seen at the right lung base with a probable small right pleural effusion. No acute osseous abnormality. IMPRESSION: Moderate cardiomegaly, increased from the prior exam, and could be combination of cardiomegaly and/or pericardial effusion. Probable small right pleural effusion and hazy airspace opacity at the right lung base which could be layering effusion/atelectasis. Electronically Signed   By: Prudencio Pair M.D.   On: 05/04/2019 16:01    Cardiac Studies   Echo pending.   Patient Profile     59 y.o. male with PMH of bipolar disorder, GERD, remote cocaine/THC abuse,  who presented with chest pain and SOB, found to have acute CHF and atrial flutter with RVR for which cardiology is following.   Assessment & Plan    1. Acute respiratory failure likely 2/2 acute CHF: patient presented with SOB. BNP elevated to 2000. CXR with moderate cardiomegaly with small right pleural effusion and hazy opacities at right lung base. Echo is pending. Ddimer also elevated to 1.54. CTA Chest is pending. He was started on IV lasix 40mg  daily for acute CHF. Weight is down 1lb from yesterday. I&Os are incomplete with unmeasured urine output. COVID pcr negative, confirmatory pending.  - Will follow-up echocardiogram - would not be surprised if EF is down given recent Afib with RVR and poorly controlled HTN.  - Continue lasix - favor transition to po in the next 24 hours as he seems to be nearing euvolemia and Cr bumped a bit.  - Continue strict I&Os and daily weights to help guide diuresis.  - Monitor electrolytes closely with goal K>4, Mg >2 - Follow-up CTA chest to f/o PE given elevated Ddimer.  2. Atrial flutter with RVR: rates persistently in the 120s-130s despite addition of metoprolol 25mg  q6h. Anticipate he may need a TEE/DCCV prior to discharge if rate control continues to be challenging - Continue metoprolol - can consolidate to succinate if EF is down - Will start eliquis 5mg  BID for CHA2DS2-VASc Score of at least 2 (CHF and HTN) - additional risk stratification with Hemoglobin A1C and CTA chest are pending. - Will plan for TEE/DCCV tomorrow. Risks and benefits of transesophageal echocardiogram have been explained including risks of esophageal damage, perforation (1:10,000 risk), bleeding, pharyngeal hematoma as well as other potential complications associated with conscious sedation including aspiration, arrhythmia, respiratory failure and death. Alternatives to treatment were discussed, questions were answered. Patient is willing to proceed.   3. HTN: BP significantly elevated  this admission. Avoiding CCB's given high suspicion for LV dysfunction.  - Managing in the context of #1 and 2 for now. Anticipate addition of ARB/ARNI prior to discharge.  4. Elevated TSH: TSH 5.3 this admission. No prior history of hypothyroidism.  - Will addon FT4 to morning labs - Defer management to primary team.   5. ETOH abuse: has not had a drink since Sunday. He does have a history of DTs. He feels on edge this morning. He is outside of the typical window for withdrawal - Continue to monitor CIWA    For questions or updates,  please contact [place] Please consult [email or web address] for contact info under Cardiology/STEMI.      Signed, Abigail Butts, PA-C  05/05/2019, 11:52 AM   [phone]  Patient seen and examined.  Agree with above documentation. On exam, patient is alert and oriented, tachycardic, regular rhythm, no murmurs, lungs CTAB, no LE edema, + JVD.  Telemetry personally reviewed, shows atrial flutter with rates 120s.  TTE planned for today.  We will continue diuresis with IV Lasix, may transition to PO lasix tomorrow.  Plan for TEE/DCCV tomorrow.  Started on Eliquis 5 mg twice daily today.  Donato Heinz, MD

## 2019-05-05 NOTE — Plan of Care (Signed)
Care plan initiated.

## 2019-05-05 NOTE — Progress Notes (Signed)
ANTICOAGULATION CONSULT NOTE - Follow Up Consult  Pharmacy Consult for heparin Indication: atrial fibrillation  Labs: Recent Labs    05/04/19 1540 05/04/19 1540 05/04/19 1719 05/04/19 2144 05/05/19 0130  HGB 11.7*   < >  --  11.8* 12.1*  HCT 35.2*  --   --  35.3* 37.2*  PLT 152  --   --  167 170  HEPARINUNFRC  --   --   --   --  0.75*  CREATININE 1.19  --   --  1.25* 1.42*  TROPONINIHS 48*  --  50*  --   --    < > = values in this interval not displayed.    Assessment: 59yo male supratherapeutic on heparin with initial dosing for Afib though lab was drawn just ~4h after bolus, which may still be affecting level.  Will continue gtt at current rate for now and recheck level.  Wynona Neat, PharmD, BCPS  05/05/2019,2:50 AM

## 2019-05-05 NOTE — Progress Notes (Signed)
  Echocardiogram 2D Echocardiogram has been performed.  Bobbye Charleston 05/05/2019, 3:46 PM

## 2019-05-05 NOTE — Progress Notes (Signed)
ANTICOAGULATION CONSULT NOTE - Initial Consult  Pharmacy Consult for apixaban Indication: atrial fibrillation  No Known Allergies  Patient Measurements: Height: 5\' 9"  (175.3 cm) Weight: 134 lb 7.7 oz (61 kg) IBW/kg (Calculated) : 70.7   Vital Signs: Temp: 98 F (36.7 C) (02/24 0500) Temp Source: Oral (02/24 0500) BP: 150/103 (02/24 0500) Pulse Rate: 128 (02/24 0500)  Labs: Recent Labs    05/04/19 1540 05/04/19 1540 05/04/19 1719 05/04/19 2144 05/05/19 0130 05/05/19 0711  HGB 11.7*   < >  --  11.8* 12.1*  --   HCT 35.2*  --   --  35.3* 37.2*  --   PLT 152  --   --  167 170  --   HEPARINUNFRC  --   --   --   --  0.75* 0.62  CREATININE 1.19  --   --  1.25* 1.42*  --   TROPONINIHS 48*  --  50*  --   --   --    < > = values in this interval not displayed.    Estimated Creatinine Clearance: 48.3 mL/min (A) (by C-G formula based on SCr of 1.42 mg/dL (H)).   Medical History: Past Medical History:  Diagnosis Date  . Anxiety   . Arthritis   . Bipolar 1 disorder ([place])   . Depression    Bipolar, not on meds/pt. stopped meds on own over 12 yrs ago  . GERD (gastroesophageal reflux disease)   . History of colon polyps   . Manic disorder ([place])   . Substance abuse ([place])    17yrs ago smoke crack and marjuana  . Tobacco abuse 05/13/2012   Assessment: 59 year old male with afib, will transition off heparin to apixaban today.   Patient currently does not require dose adjustments but he is borderline, age 87, wt 83 (60kg cutoff), scr has trended up to 1.4 (1.5 cutoff). CBC within normal limits.   Goal of Therapy:  Monitor platelets by anticoagulation protocol: Yes   Plan:  Stop heparin after apixaban given Apixaban 5mg  bid - first dose now  Erin Hearing PharmD., BCPS Clinical Pharmacist 05/05/2019 11:36 AM

## 2019-05-05 NOTE — Progress Notes (Addendum)
Progress Note  Patient Name: Austin Cobb Date of Encounter: 05/05/2019  Primary Cardiologist: Donato Heinz, MD   Subjective   Patient reports improvement in SOB and orthopnea this morning. Feeling a little on edge. He is attempting to pull his IV tubing apart when I entered the room because it is confining him. Has not had a drink since Sunday. No complaints of chest pain. He is unaware of his atrial flutter with rapid rates. Updated his roommate [name] via phone.   Inpatient Medications    Scheduled Meds: . apixaban  5 mg Oral BID  . folic acid  1 mg Oral Daily  . furosemide  40 mg Intravenous BID  . metoprolol tartrate  25 mg Oral Q6H  . multivitamin with minerals  1 tablet Oral Daily  . thiamine  100 mg Oral Daily   Or  . thiamine  100 mg Intravenous Daily   Continuous Infusions: . cefTRIAXone (ROCEPHIN)  IV     PRN Meds: LORazepam **OR** LORazepam, metoprolol tartrate   Vital Signs    Vitals:   05/04/19 2035 05/04/19 2040 05/04/19 2100 05/05/19 0500  BP:  (!) 155/116  (!) 150/103  Pulse:  (!) 130 (!) 129 (!) 128  Resp:  18  18  Temp:  (!) 97.4 F (36.3 C)  98 F (36.7 C)  TempSrc:  Oral  Oral  SpO2:  100% 92% 100%  Weight: 61.6 kg  61.6 kg 61 kg  Height: 5\' 9"  (1.753 m)  5\' 9"  (1.753 m)     Intake/Output Summary (Last 24 hours) at 05/05/2019 1152 Last data filed at 05/05/2019 0600 Gross per 24 hour  Intake 957.79 ml  Output 350 ml  Net 607.79 ml   Filed Weights   05/04/19 2035 05/04/19 2100 05/05/19 0500  Weight: 61.6 kg 61.6 kg 61 kg    Telemetry    Atrial flutter with predominately 2:1 AV block, rates in the 120s-130s.  - Personally Reviewed  ECG    No new tracings - Personally Reviewed  Physical Exam   GEN: No acute distress.   Neck: unable to assess JVD due to patient cooperation Cardiac: IRIR, no murmurs, rubs, or gallops.  Respiratory: Clear to auscultation bilaterally, no wheezes/ rales/ rhonchi GI: NABS, Soft,  nontender, non-distended  MS: No edema; No deformity. Neuro:  Nonfocal, moving all extremities spontaneously Psych: Anxious   Labs    Chemistry Recent Labs  Lab 05/04/19 1540 05/04/19 2144 05/05/19 0130  NA 139 139 138  K 4.4 4.1 3.9  CL 108 105 104  CO2 21* 21* 22  GLUCOSE 124* 156* 135*  BUN 19 20 20   CREATININE 1.19 1.25* 1.42*  CALCIUM 8.7* 8.7* 8.9  PROT 6.4* 6.5  --   ALBUMIN 3.4* 3.5  --   AST 66* 77*  --   ALT 46* 53*  --   ALKPHOS 73 76  --   BILITOT 0.9 1.1  --   GFRNONAA >60 >60 54*  GFRAA >60 >60 >60  ANIONGAP 10 13 12      Hematology Recent Labs  Lab 05/04/19 1540 05/04/19 2144 05/05/19 0130  WBC 6.5 5.8 6.2  RBC 3.66* 3.77* 3.89*  HGB 11.7* 11.8* 12.1*  HCT 35.2* 35.3* 37.2*  MCV 96.2 93.6 95.6  MCH 32.0 31.3 31.1  MCHC 33.2 33.4 32.5  RDW 14.2 14.0 14.3  PLT 152 167 170    Cardiac EnzymesNo results for input(s): TROPONINI in the last 168 hours. No results for input(s):  TROPIPOC in the last 168 hours.   BNP Recent Labs  Lab 05/04/19 1540  BNP 2,060.2*     DDimer  Recent Labs  Lab 05/04/19 2144  DDIMER 1.54*     Radiology    DG Chest Portable 1 View  Result Date: 05/04/2019 CLINICAL DATA:  Shortness of breath EXAM: PORTABLE CHEST 1 VIEW COMPARISON:  August 09, 2017 FINDINGS: There is moderate cardiomegaly, increased from the prior exam. Mildly hazy airspace opacity seen at the right lung base with a probable small right pleural effusion. No acute osseous abnormality. IMPRESSION: Moderate cardiomegaly, increased from the prior exam, and could be combination of cardiomegaly and/or pericardial effusion. Probable small right pleural effusion and hazy airspace opacity at the right lung base which could be layering effusion/atelectasis. Electronically Signed   By: Prudencio Pair M.D.   On: 05/04/2019 16:01    Cardiac Studies   Echo pending.   Patient Profile     59 y.o. male with PMH of bipolar disorder, GERD, remote cocaine/THC abuse,  who presented with chest pain and SOB, found to have acute CHF and atrial flutter with RVR for which cardiology is following.   Assessment & Plan    1. Acute respiratory failure likely 2/2 acute CHF: patient presented with SOB. BNP elevated to 2000. CXR with moderate cardiomegaly with small right pleural effusion and hazy opacities at right lung base. Echo is pending. Ddimer also elevated to 1.54. CTA Chest is pending. He was started on IV lasix 40mg  daily for acute CHF. Weight is down 1lb from yesterday. I&Os are incomplete with unmeasured urine output. COVID pcr negative, confirmatory pending.  - Will follow-up echocardiogram - would not be surprised if EF is down given recent Afib with RVR and poorly controlled HTN.  - Continue lasix - favor transition to po in the next 24 hours as he seems to be nearing euvolemia and Cr bumped a bit.  - Continue strict I&Os and daily weights to help guide diuresis.  - Monitor electrolytes closely with goal K>4, Mg >2 - Follow-up CTA chest to f/o PE given elevated Ddimer.  2. Atrial flutter with RVR: rates persistently in the 120s-130s despite addition of metoprolol 25mg  q6h. Anticipate he may need a TEE/DCCV prior to discharge if rate control continues to be challenging - Continue metoprolol - can consolidate to succinate if EF is down - Will start eliquis 5mg  BID for CHA2DS2-VASc Score of at least 2 (CHF and HTN) - additional risk stratification with Hemoglobin A1C and CTA chest are pending. - Will plan for TEE/DCCV tomorrow. Risks and benefits of transesophageal echocardiogram have been explained including risks of esophageal damage, perforation (1:10,000 risk), bleeding, pharyngeal hematoma as well as other potential complications associated with conscious sedation including aspiration, arrhythmia, respiratory failure and death. Alternatives to treatment were discussed, questions were answered. Patient is willing to proceed.   3. HTN: BP significantly elevated  this admission. Avoiding CCB's given high suspicion for LV dysfunction.  - Managing in the context of #1 and 2 for now. Anticipate addition of ARB/ARNI prior to discharge.  4. Elevated TSH: TSH 5.3 this admission. No prior history of hypothyroidism.  - Will addon FT4 to morning labs - Defer management to primary team.   5. ETOH abuse: has not had a drink since Sunday. He does have a history of DTs. He feels on edge this morning. He is outside of the typical window for withdrawal - Continue to monitor CIWA    For questions or updates,  please contact [place] Please consult [email or web address] for contact info under Cardiology/STEMI.      Signed, Abigail Butts, PA-C  05/05/2019, 11:52 AM   [phone]  Patient seen and examined.  Agree with above documentation. On exam, patient is alert and oriented, tachycardic, regular rhythm, no murmurs, lungs CTAB, no LE edema, + JVD.  Telemetry personally reviewed, shows atrial flutter with rates 120s.  TTE planned for today.  We will continue diuresis with IV Lasix, may transition to PO lasix tomorrow.  Plan for TEE/DCCV tomorrow.  Started on Eliquis 5 mg twice daily today.  Donato Heinz, MD

## 2019-05-06 ENCOUNTER — Encounter (HOSPITAL_COMMUNITY)
Admission: EM | Disposition: A | Discharge status: Skilled Nursing Facility | Payer: Self-pay | Source: Home / Self Care | Attending: Internal Medicine

## 2019-05-06 ENCOUNTER — Inpatient Hospital Stay (HOSPITAL_COMMUNITY): Payer: Medicare PPO

## 2019-05-06 ENCOUNTER — Inpatient Hospital Stay (HOSPITAL_COMMUNITY): Payer: Medicare PPO | Admitting: Anesthesiology

## 2019-05-06 ENCOUNTER — Encounter (HOSPITAL_COMMUNITY): Payer: Self-pay | Admitting: Internal Medicine

## 2019-05-06 ENCOUNTER — Inpatient Hospital Stay: Payer: Self-pay

## 2019-05-06 DIAGNOSIS — I5041 Acute combined systolic (congestive) and diastolic (congestive) heart failure: Secondary | ICD-10-CM

## 2019-05-06 DIAGNOSIS — I5082 Biventricular heart failure: Secondary | ICD-10-CM

## 2019-05-06 DIAGNOSIS — N179 Acute kidney failure, unspecified: Secondary | ICD-10-CM

## 2019-05-06 DIAGNOSIS — I4892 Unspecified atrial flutter: Secondary | ICD-10-CM

## 2019-05-06 DIAGNOSIS — I340 Nonrheumatic mitral (valve) insufficiency: Secondary | ICD-10-CM

## 2019-05-06 DIAGNOSIS — R778 Other specified abnormalities of plasma proteins: Secondary | ICD-10-CM

## 2019-05-06 DIAGNOSIS — I5043 Acute on chronic combined systolic (congestive) and diastolic (congestive) heart failure: Secondary | ICD-10-CM | POA: Diagnosis present

## 2019-05-06 DIAGNOSIS — E0781 Sick-euthyroid syndrome: Secondary | ICD-10-CM

## 2019-05-06 DIAGNOSIS — I5042 Chronic combined systolic (congestive) and diastolic (congestive) heart failure: Secondary | ICD-10-CM | POA: Diagnosis present

## 2019-05-06 DIAGNOSIS — R7989 Other specified abnormal findings of blood chemistry: Secondary | ICD-10-CM

## 2019-05-06 DIAGNOSIS — F1020 Alcohol dependence, uncomplicated: Secondary | ICD-10-CM

## 2019-05-06 HISTORY — PX: CARDIOVERSION: SHX1299

## 2019-05-06 HISTORY — PX: TEE WITHOUT CARDIOVERSION: SHX5443

## 2019-05-06 LAB — RENAL FUNCTION PANEL
Albumin: 3.4 g/dL — ABNORMAL LOW (ref 3.5–5.0)
Anion gap: 12 (ref 5–15)
BUN: 25 mg/dL — ABNORMAL HIGH (ref 6–20)
CO2: 23 mmol/L (ref 22–32)
Calcium: 8.6 mg/dL — ABNORMAL LOW (ref 8.9–10.3)
Chloride: 102 mmol/L (ref 98–111)
Creatinine, Ser: 1.52 mg/dL — ABNORMAL HIGH (ref 0.61–1.24)
GFR calc Af Amer: 57 mL/min — ABNORMAL LOW (ref >60)
GFR calc non Af Amer: 49 mL/min — ABNORMAL LOW (ref >60)
Glucose, Bld: 109 mg/dL — ABNORMAL HIGH (ref 70–99)
Phosphorus: 5.4 mg/dL — ABNORMAL HIGH (ref 2.5–4.6)
Potassium: 3.7 mmol/L (ref 3.5–5.1)
Sodium: 137 mmol/L (ref 135–145)

## 2019-05-06 LAB — GLUCOSE, CAPILLARY: Glucose-Capillary: 111 mg/dL — ABNORMAL HIGH (ref 70–99)

## 2019-05-06 LAB — PROTIME-INR
INR: 1.5 — ABNORMAL HIGH (ref 0.8–1.2)
Prothrombin Time: 18 seconds — ABNORMAL HIGH (ref 11.4–15.2)

## 2019-05-06 LAB — CBC
HCT: 33.1 % — ABNORMAL LOW (ref 39.0–52.0)
Hemoglobin: 11.3 g/dL — ABNORMAL LOW (ref 13.0–17.0)
MCH: 31.6 pg (ref 26.0–34.0)
MCHC: 34.1 g/dL (ref 30.0–36.0)
MCV: 92.5 fL (ref 80.0–100.0)
Platelets: 156 10*3/uL (ref 150–400)
RBC: 3.58 MIL/uL — ABNORMAL LOW (ref 4.22–5.81)
RDW: 14 % (ref 11.5–15.5)
WBC: 7.3 10*3/uL (ref 4.0–10.5)
nRBC: 0 % (ref 0.0–0.2)

## 2019-05-06 LAB — URINE CULTURE: Culture: >=100000 — AB

## 2019-05-06 LAB — MAGNESIUM: Magnesium: 2.1 mg/dL (ref 1.7–2.4)

## 2019-05-06 SURGERY — ECHOCARDIOGRAM, TRANSESOPHAGEAL
Anesthesia: Monitor Anesthesia Care

## 2019-05-06 MED ORDER — AMIODARONE HCL IN DEXTROSE 360-4.14 MG/200ML-% IV SOLN
30.0000 mg/h | INTRAVENOUS | Status: DC
  Administered 2019-05-06 – 2019-05-08 (×6): 30 mg/h via INTRAVENOUS
  Administered 2019-05-09 (×3): 60 mg/h via INTRAVENOUS
  Administered 2019-05-10: 30 mg/h via INTRAVENOUS
  Administered 2019-05-10 – 2019-05-11 (×5): 60 mg/h via INTRAVENOUS
  Administered 2019-05-12 – 2019-05-16 (×9): 30 mg/h via INTRAVENOUS
  Filled 2019-05-06 (×27): qty 200

## 2019-05-06 MED ORDER — METOPROLOL SUCCINATE ER 25 MG PO TB24
12.5000 mg | ORAL_TABLET | Freq: Every day | ORAL | Status: DC
  Administered 2019-05-06: 12.5 mg via ORAL
  Filled 2019-05-06: qty 1

## 2019-05-06 MED ORDER — NICOTINE 21 MG/24HR TD PT24
21.0000 mg | MEDICATED_PATCH | Freq: Every day | TRANSDERMAL | Status: DC
  Administered 2019-05-06 – 2019-05-26 (×21): 21 mg via TRANSDERMAL
  Filled 2019-05-06 (×21): qty 1

## 2019-05-06 MED ORDER — HEPARIN (PORCINE) 25000 UT/250ML-% IV SOLN: 1050.0000 [IU]/h | INTRAVENOUS | Status: DC

## 2019-05-06 MED ORDER — LIVING BETTER WITH HEART FAILURE BOOK: Freq: Once | Status: AC

## 2019-05-06 MED ORDER — PROPOFOL 10 MG/ML IV BOLUS
INTRAVENOUS | Status: DC | PRN
  Administered 2019-05-06 (×4): 10 mg via INTRAVENOUS

## 2019-05-06 MED ORDER — METOPROLOL SUCCINATE ER 50 MG PO TB24
50.0000 mg | ORAL_TABLET | Freq: Every day | ORAL | Status: DC
  Filled 2019-05-06: qty 1

## 2019-05-06 MED ORDER — POTASSIUM CHLORIDE CRYS ER 20 MEQ PO TBCR
30.0000 meq | EXTENDED_RELEASE_TABLET | Freq: Once | ORAL | Status: AC
  Administered 2019-05-06: 30 meq via ORAL
  Filled 2019-05-06: qty 1

## 2019-05-06 MED ORDER — METOPROLOL SUCCINATE ER 100 MG PO TB24: 100.0000 mg | ORAL_TABLET | Freq: Every day | ORAL | Status: DC

## 2019-05-06 MED ORDER — SODIUM CHLORIDE 0.9% FLUSH
3.0000 mL | Freq: Two times a day (BID) | INTRAVENOUS | Status: DC
  Administered 2019-05-06 – 2019-05-18 (×10): 3 mL via INTRAVENOUS

## 2019-05-06 MED ORDER — POTASSIUM CHLORIDE CRYS ER 20 MEQ PO TBCR
40.0000 meq | EXTENDED_RELEASE_TABLET | Freq: Once | ORAL | Status: AC
  Administered 2019-05-06: 17:00:00 40 meq via ORAL
  Filled 2019-05-06: qty 2

## 2019-05-06 MED ORDER — DEXMEDETOMIDINE HCL IN NACL 200 MCG/50ML IV SOLN
INTRAVENOUS | Status: DC | PRN
  Administered 2019-05-06 (×5): 4 ug via INTRAVENOUS

## 2019-05-06 MED ORDER — FUROSEMIDE 10 MG/ML IJ SOLN
40.0000 mg | Freq: Two times a day (BID) | INTRAMUSCULAR | Status: DC
  Administered 2019-05-06: 40 mg via INTRAVENOUS
  Filled 2019-05-06 (×2): qty 4

## 2019-05-06 MED ORDER — AMIODARONE LOAD VIA INFUSION
150.0000 mg | Freq: Once | INTRAVENOUS | Status: AC
  Administered 2019-05-06: 150 mg via INTRAVENOUS
  Filled 2019-05-06: qty 83.34

## 2019-05-06 MED ORDER — METOPROLOL SUCCINATE ER 25 MG PO TB24
25.0000 mg | ORAL_TABLET | Freq: Once | ORAL | Status: AC
  Administered 2019-05-06: 25 mg via ORAL
  Filled 2019-05-06: qty 1

## 2019-05-06 MED ORDER — PROPOFOL 500 MG/50ML IV EMUL
INTRAVENOUS | Status: DC | PRN
  Administered 2019-05-06: 30 ug/kg/min via INTRAVENOUS

## 2019-05-06 MED ORDER — SODIUM CHLORIDE 0.9 % IV SOLN: INTRAVENOUS | Status: DC

## 2019-05-06 MED ORDER — HEPARIN (PORCINE) 25000 UT/250ML-% IV SOLN
1050.0000 [IU]/h | INTRAVENOUS | Status: DC
  Administered 2019-05-06: 1050 [IU]/h via INTRAVENOUS
  Filled 2019-05-06: qty 250

## 2019-05-06 MED ORDER — DIGOXIN 125 MCG PO TABS
0.1250 mg | ORAL_TABLET | Freq: Every day | ORAL | Status: DC
  Administered 2019-05-06: 0.125 mg via ORAL
  Filled 2019-05-06: qty 1

## 2019-05-06 MED ORDER — AMIODARONE HCL IN DEXTROSE 360-4.14 MG/200ML-% IV SOLN
30.0000 mg/h | INTRAVENOUS | Status: DC
  Administered 2019-05-06: 60 mg/h via INTRAVENOUS
  Filled 2019-05-06: qty 200

## 2019-05-06 NOTE — Progress Notes (Signed)
Attempted to gain consent for PICC from patient, unable to sign at this time. Patient only arouses briefly with stimulation and unable to remain awake during PICC consent process. Will assess in the am.

## 2019-05-06 NOTE — Progress Notes (Addendum)
Progress Note  Patient Name: Austin Cobb Date of Encounter: 05/06/2019  Primary Cardiologist: Donato Heinz, MD   Subjective   Patient reports feeling okay this morning. He continues to be frustrated by medical equipment; tele box bothers him while sleeping, pulse ox limits movement in room. He feels his breathing is about the same from yesterday. Has a cough. Craving a cigarette. We discussed his ETOH use - drinking 1-2 bottles of malt liquor or high % beer. We discussed the importance of abstaining from alcohol going forward. Attempted to provide CHF education, though he reports being tired. Encouraged him to read the Living with Heart Failure book.    Inpatient Medications    Scheduled Meds: . apixaban  5 mg Oral BID  . folic acid  1 mg Oral Daily  . metoprolol tartrate  25 mg Oral Q6H  . multivitamin with minerals  1 tablet Oral Daily  . thiamine  100 mg Oral Daily   Or  . thiamine  100 mg Intravenous Daily   Continuous Infusions: . cefTRIAXone (ROCEPHIN)  IV Stopped (05/05/19 2030)   PRN Meds: LORazepam **OR** LORazepam, metoprolol tartrate   Vital Signs    Vitals:   05/06/19 0815 05/06/19 0830 05/06/19 0835 05/06/19 0850  BP: 102/72 108/81 90/64 (!) 86/63  Pulse: (!) 109  62 72  Resp: 16  12 20   Temp: 97.7 F (36.5 C)     TempSrc: Temporal     SpO2: 94%  93% 97%  Weight:      Height:        Intake/Output Summary (Last 24 hours) at 05/06/2019 0909 Last data filed at 05/06/2019 0816 Gross per 24 hour  Intake 390 ml  Output --  Net 390 ml   Filed Weights   05/04/19 2100 05/05/19 0500 05/06/19 0706  Weight: 61.6 kg 61 kg 61 kg    Telemetry    Sinus tachycardia with rates in the 110s with return to HR in the 60s when he laid down to sleep - Personally Reviewed  ECG    Sinus tachycardia rate 105, inferolateral T wave abnormalities seen on previous.  - Personally Reviewed  Physical Exam   GEN: Sitting on the edge of his bed eating  breakfast in no acute distress.   Neck: No JVD, no carotid bruits Cardiac: tachycardic, regular rhythm, no murmurs, rubs, or gallops.  Respiratory: diffuse expiratory wheeze, no rhonchi or rales.  GI: NABS, Soft, nontender, non-distended  MS: No edema; No deformity. Neuro:  Nonfocal, moving all extremities spontaneously Psych: Normal affect   Labs    Chemistry Recent Labs  Lab 05/04/19 1540 05/04/19 1540 05/04/19 2144 05/05/19 0130 05/06/19 0549  NA 139   < > 139 138 137  K 4.4   < > 4.1 3.9 3.7  CL 108   < > 105 104 102  CO2 21*   < > 21* 22 23  GLUCOSE 124*   < > 156* 135* 109*  BUN 19   < > 20 20 25*  CREATININE 1.19   < > 1.25* 1.42* 1.52*  CALCIUM 8.7*   < > 8.7* 8.9 8.6*  PROT 6.4*  --  6.5  --   --   ALBUMIN 3.4*  --  3.5  --  3.4*  AST 66*  --  77*  --   --   ALT 46*  --  53*  --   --   ALKPHOS 73  --  76  --   --  BILITOT 0.9  --  1.1  --   --   GFRNONAA >60   < > >60 54* 49*  GFRAA >60   < > >60 >60 57*  ANIONGAP 10   < > 13 12 12    < > = values in this interval not displayed.     Hematology Recent Labs  Lab 05/04/19 2144 05/05/19 0130 05/06/19 0549  WBC 5.8 6.2 7.3  RBC 3.77* 3.89* 3.58*  HGB 11.8* 12.1* 11.3*  HCT 35.3* 37.2* 33.1*  MCV 93.6 95.6 92.5  MCH 31.3 31.1 31.6  MCHC 33.4 32.5 34.1  RDW 14.0 14.3 14.0  PLT 167 170 156    Cardiac EnzymesNo results for input(s): TROPONINI in the last 168 hours. No results for input(s): TROPIPOC in the last 168 hours.   BNP Recent Labs  Lab 05/04/19 1540  BNP 2,060.2*     DDimer  Recent Labs  Lab 05/04/19 2144  DDIMER 1.54*     Radiology    CT ANGIO CHEST PE W OR WO CONTRAST  Result Date: 05/05/2019 CLINICAL DATA:  Increasing shortness of breath for 1 day. Chest pain. EXAM: CT ANGIOGRAPHY CHEST WITH CONTRAST TECHNIQUE: Multidetector CT imaging of the chest was performed using the standard protocol during bolus administration of intravenous contrast. Multiplanar CT image reconstructions  and MIPs were obtained to evaluate the vascular anatomy. CONTRAST:  80 mL OMNIPAQUE IOHEXOL 350 MG/ML SOLN COMPARISON:  Single-view of the chest 05/04/2019. CT chest 05/12/2016. FINDINGS: Cardiovascular: Distal segmental branches are not opacified. No pulmonary embolus is identified. There is marked cardiomegaly. No pericardial effusion. Scattered aortic atherosclerotic calcifications are present. Mediastinum/Nodes: No enlarged mediastinal, hilar, or axillary lymph nodes. Thyroid gland, trachea, and esophagus demonstrate no significant findings. Lungs/Pleura: Moderate bilateral pleural effusions are seen, larger on the right. Scattered mild ground-glass attenuation is likely due to atelectasis. No consolidative process, nodule, mass or pneumothorax. Upper Abdomen: Negative. Musculoskeletal: No acute or focal abnormality. Review of the MIP images confirms the above findings. IMPRESSION: Negative for pulmonary embolus. As noted above, the distal segmental branches are not opacified with contrast. Marked cardiomegaly. Moderate bilateral pleural effusions, larger on the right. Aortic Atherosclerosis (ICD10-I70.0). Electronically Signed   By: Inge Rise M.D.   On: 05/05/2019 13:46   DG Chest Portable 1 View  Result Date: 05/04/2019 CLINICAL DATA:  Shortness of breath EXAM: PORTABLE CHEST 1 VIEW COMPARISON:  August 09, 2017 FINDINGS: There is moderate cardiomegaly, increased from the prior exam. Mildly hazy airspace opacity seen at the right lung base with a probable small right pleural effusion. No acute osseous abnormality. IMPRESSION: Moderate cardiomegaly, increased from the prior exam, and could be combination of cardiomegaly and/or pericardial effusion. Probable small right pleural effusion and hazy airspace opacity at the right lung base which could be layering effusion/atelectasis. Electronically Signed   By: Prudencio Pair M.D.   On: 05/04/2019 16:01   ECHOCARDIOGRAM COMPLETE  Result Date: 05/05/2019     ECHOCARDIOGRAM REPORT   Patient Name:   Austin Cobb Date of Exam: 05/05/2019 Medical Rec #:  [id number]         Height:       69.0 in Accession #:    [id number]        Weight:       134.5 lb Date of Birth:  [birth year]          BSA:          1.745 m Patient Age:    59 years  BP:           150/103 mmHg Patient Gender: M                 HR:           128 bpm. Exam Location:  Inpatient Procedure: 2D Echo, Cardiac Doppler and Color Doppler Indications:    I50.40* Unspecified combined systolic (congestive) and diastolic                 (congestive) heart failure  History:        Patient has no prior history of Echocardiogram examinations.                 Abnormal ECG, Arrythmias:Atrial Flutter, Signs/Symptoms:Dyspnea;                 Risk Factors:Current Smoker and Hypertension. ETOH. Poly                 substance abuse.  Sonographer:    Roseanna Rainbow RDCS Referring Phys: [place]  1. LVOT VTI 7 cm. CO ~3.5 L/min. CI ~2.0 L/min/m2. Left ventricular ejection fraction, by estimation, is 10-15%. The left ventricle has severely decreased function. The left ventricle demonstrates global hypokinesis. Left ventricular diastolic function could not be evaluated. Left ventricular diastolic function could not be evaluated.  2. Right ventricular systolic function is severely reduced. The right ventricular size is severely enlarged. There is normal pulmonary artery systolic pressure. The estimated right ventricular systolic pressure is Q000111Q mmHg.  3. Left atrial size was mildly dilated.  4. Right atrial size was mild to moderately dilated.  5. The mitral valve is grossly normal. Mild to moderate mitral valve regurgitation.  6. The aortic valve is tricuspid. Aortic valve regurgitation is not visualized. No aortic stenosis is present.  7. The inferior vena cava is dilated in size with <50% respiratory variability, suggesting right atrial pressure of 15 mmHg. FINDINGS  Left Ventricle: LVOT VTI 7 cm. CO  ~3.5 L/min. CI ~2.0 L/min/m2. Left ventricular ejection fraction, by estimation, is 10-15%. The left ventricle has severely decreased function. The left ventricle demonstrates global hypokinesis. The left ventricular internal cavity size was normal in size. There is no left ventricular hypertrophy. Left ventricular diastolic function could not be evaluated due to atrial fibrillation. Left ventricular diastolic function could not be evaluated. Right Ventricle: The right ventricular size is severely enlarged. No increase in right ventricular wall thickness. Right ventricular systolic function is severely reduced. There is normal pulmonary artery systolic pressure. The tricuspid regurgitant velocity is 1.82 m/s, and with an assumed right atrial pressure of 15 mmHg, the estimated right ventricular systolic pressure is Q000111Q mmHg. Left Atrium: Left atrial size was mildly dilated. Right Atrium: Right atrial size was mild to moderately dilated. Pericardium: A small pericardial effusion is present. The pericardial effusion is posterior to the left ventricle and the left atrium. Mitral Valve: The mitral valve is grossly normal. Mild to moderate mitral valve regurgitation. Tricuspid Valve: The tricuspid valve is grossly normal. Tricuspid valve regurgitation is mild. Aortic Valve: The aortic valve is tricuspid. Aortic valve regurgitation is not visualized. No aortic stenosis is present. Pulmonic Valve: The pulmonic valve was grossly normal. Pulmonic valve regurgitation is not visualized. Aorta: The aortic root and ascending aorta are structurally normal, with no evidence of dilitation. Venous: The inferior vena cava is dilated in size with less than 50% respiratory variability, suggesting right atrial pressure of 15 mmHg. IAS/Shunts: No atrial level shunt  detected by color flow Doppler.  LEFT VENTRICLE PLAX 2D LVIDd:         4.60 cm     Diastology LVIDs:         4.20 cm     LV e' lateral:   2.12 cm/s LV PW:         1.70 cm      LV E/e' lateral: 28.7 LV IVS:        1.10 cm     LV e' medial:    2.12 cm/s LVOT diam:     2.20 cm     LV E/e' medial:  28.7 LV SV:         27 LV SV Index:   15 LVOT Area:     3.80 cm  LV Volumes (MOD) LV vol d, MOD A2C: 63.8 ml LV vol d, MOD A4C: 80.9 ml LV vol s, MOD A2C: 57.9 ml LV vol s, MOD A4C: 64.5 ml LV SV MOD A2C:     5.9 ml LV SV MOD A4C:     80.9 ml LV SV MOD BP:      12.7 ml RIGHT VENTRICLE            IVC RV S prime:     4.37 cm/s  IVC diam: 3.20 cm TAPSE (M-mode): 0.9 cm LEFT ATRIUM             Index LA diam:        3.70 cm 2.12 cm/m LA Vol (A2C):   51.4 ml 29.45 ml/m LA Vol (A4C):   68.1 ml 39.02 ml/m LA Biplane Vol: 59.3 ml 33.98 ml/m  AORTIC VALVE LVOT Vmax:   49.90 cm/s LVOT Vmean:  35.000 cm/s LVOT VTI:    0.070 m  AORTA Ao Root diam: 3.50 cm Ao Asc diam:  3.20 cm MITRAL VALVE                 TRICUSPID VALVE MV Area (PHT): 6.24 cm      TR Peak grad:   13.2 mmHg MV Decel Time: 122 msec      TR Vmax:        182.00 cm/s MR Peak grad:    88.0 mmHg MR Mean grad:    48.0 mmHg   SHUNTS MR Vmax:         469.00 cm/s Systemic VTI:  0.07 m MR Vmean:        306.0 cm/s  Systemic Diam: 2.20 cm MR PISA:         0.57 cm MR PISA Eff ROA: 6 mm MR PISA Radius:  0.30 cm MV E velocity: 60.90 cm/s Eleonore Chiquito MD Electronically signed by Eleonore Chiquito MD Signature Date/Time: 05/05/2019/6:15:39 PM    Final     Cardiac Studies   Echocardiogram 05/05/19: 1. LVOT VTI 7 cm. CO ~3.5 L/min. CI ~2.0 L/min/m2. Left ventricular  ejection fraction, by estimation, is 10-15%. The left ventricle has  severely decreased function. The left ventricle demonstrates global  hypokinesis. Left ventricular diastolic function  could not be evaluated. Left ventricular diastolic function could not be  evaluated.  2. Right ventricular systolic function is severely reduced. The right  ventricular size is severely enlarged. There is normal pulmonary artery  systolic pressure. The estimated right ventricular systolic pressure  is  Q000111Q mmHg.  3. Left atrial size was mildly dilated.  4. Right atrial size was mild to moderately dilated.  5. The mitral valve is grossly normal. Mild to moderate  mitral valve  regurgitation.  6. The aortic valve is tricuspid. Aortic valve regurgitation is not  visualized. No aortic stenosis is present.  7. The inferior vena cava is dilated in size with <50% respiratory  variability, suggesting right atrial pressure of 15 mmHg.   Patient Profile     59 y.o. male with PMH of bipolar disorder, GERD, remote cocaine/THC abuse, who presented with chest pain and SOB, found to have acute CHF and atrial flutter with RVR for which cardiology is following.   Assessment & Plan    1. Acute combined CHF: patient presented with SOB. BNP elevated to 2000. CXR with moderate cardiomegaly with small right pleural effusion and hazy opacities at right lung base. Echo with severe LV dysfunction - EF 10-15%, global hypokinesis, unable to assess LV diastolic function due to Aflutter, severely dilated RV with reduced RV systolic function, mild LAE, mild-mod RAE, mild-moderate MR, and dilated IVC. Ddimer also elevated to 1.54. CTA Chest is negative for PE. He was started on IV lasix 40mg  daily for acute CHF. Weight is stagnant from yesterday. I&Os are incomplete with no documented urine output. He is s/p cardioversion with return to NSR. BP's briefly soft post DCCV. Suspect cardiomyopathy is tachycardia mediated with possible ETOH component. No significant anginal complaints to suggest ischemic etiology and only noted to have scattered aortic atherosclerosis on CTA chest yesterday. He does have moderate bilateral pleural effusions (R>L). Lasix held this morning given continue rise in Cr - up to 1.5 from 1.4 yesterday; baseline 1.1. Question low output heart failure. Dr. Gardiner Rhyme to review further.  - Living with Heart Failure booklet ordered. - Continue metoprolol - will transition to succinate 50mg  daily at this  time.  - Would benefit from addition of entresto and/or spironolactone if room in BP/Cr - Continue strict I&Os and daily weights to help guide diuresis - encouraged compliance with this  - Monitor electrolytes closely with goal K>4, Mg >2 - Has not been diuresing well, renal function continues to worsen.  Will place PICC to check co-ox/CVP and consult Advanced Heart Failure for evaluation  2. Atrial flutter with RVR: patient presented with Aflutter with predominately 2:1 block with rates 120s-130s despite addition of metoprolol 25mg  q6h. He underwent TEE/DCCV this morning with successful return to sinus rhythm. He is still tachycardic to the 110s.  - Continue metoprolol for rate control - will transition to succinate 50mg  daily at this time.  - Continue eliquis 5mg  BID uninterrupted for 1 month for CHA2DS2-VASc Score of at least 2 (CHF and HTN)  - Given intermittent atrial tachycardia vs atypical flutter post cardioversion, will start on amio gtt to try and maintain sinus rhythm  3. HTN: BP significantly elevated this admission, now soft following cardioversion.  - Managing in the context of #1 and 2 for now.   4. Elevated TSH: TSH 5.3, FT4 1.68 this admission, c/w euthyroid sick syndrome  - Defer management to primary team - recommended for repeat thyroid studies in 4-6 weeks.   5. ETOH abuse: has not had a drink since Sunday. He does have a history of DTs. He feels on edge this morning. He is outside of the typical window for withdrawal - Continue to monitor CIWA   6. Tobacco abuse: smokes 1ppd when money is good. Craving a cigarette today - Will order a nicotine patch.   For questions or updates, please contact [place] Please consult [email or web address] for contact info under Cardiology/STEMI.      Signed, Lorelee Cover.  K[place]eger, PA-C  05/06/2019, 9:09 AM   [phone]  Patient seen and examined.  Agree with above documentation.  Underwent successful TEE/DCCV today.  Has been in  sinus rhythm, but on telemetry appears to also be intermittently going into a supraventricular tachycardia with rates in 100s, suspect atrial tachycardia versus atypical flutter.  On exam, patient is alert and oriented, regular rate and rhythm, no murmurs, lungs CTAB, no LE edema, + JVD.  Labs notable for worsening renal function with diuresis.  Discussed with Dr Aundra Dubin in [place] Failure, recommend PICC placement and check co-ox/CVP.  Start amio to try and maintain sinus rhythm.  Donato Heinz, MD

## 2019-05-06 NOTE — Progress Notes (Signed)
Bilateral lower extremity venous duplex has been completed. Preliminary results can be found in CV Proc through chart review.   05/06/19 10:51 AM Carlos Levering RVT

## 2019-05-06 NOTE — Transfer of Care (Signed)
Immediate Anesthesia Transfer of Care Note  Patient: Austin Cobb  Procedure(s) Performed: TRANSESOPHAGEAL ECHOCARDIOGRAM (TEE) (N/A ) CARDIOVERSION (N/A )  Patient Location: Endoscopy Unit  Anesthesia Type:MAC  Level of Consciousness: drowsy and patient cooperative  Airway & Oxygen Therapy: Patient Spontanous Breathing and Patient connected to nasal cannula oxygen  Post-op Assessment: Report given to RN, Post -op Vital signs reviewed and stable and Patient moving all extremities X 4  Post vital signs: Reviewed and stable  Last Vitals:  Vitals Value Taken Time  BP 102/72 05/06/19 0813  Temp    Pulse 108 05/06/19 0815  Resp 16 05/06/19 0815  SpO2 96 % 05/06/19 0815  Vitals shown include unvalidated device data.  Last Pain:  Vitals:   05/06/19 0706  TempSrc: Oral  PainSc: 0-No pain         Complications: No apparent anesthesia complications

## 2019-05-06 NOTE — TOC Benefit Eligibility Note (Signed)
Transition of Care [facility]) Benefit Eligibility Note    Patient Details  Name: SAGEN AREOLA MRN: [id number] Date of Birth: 25-Aug-1960   Medication/Dose: apixaban (Eliquis) 5 mg twice a day  Covered?: Yes  Tier: 3 Drug  Prescription Coverage Preferred Pharmacy: [facility] or any retail Pharmacy  Spoke with Person/Company/Phone Number:: Denise/ [employer]  Sol [phone]  Co-Pay: 312.00 for 30 day supply  Prior Approval: No  Deductible: Unmet(265.00)  Additional Notes: Check on Xarelto and it is the same amount for a 30 day supply    [employer] Phone Number: 05/06/2019, 12:00 PM

## 2019-05-06 NOTE — CV Procedure (Signed)
    Transesophageal Echocardiogram Note  Austin Cobb [id number] 01-18-1961  Procedure: Transesophageal Echocardiogram Indications:  Atrial flutter   Procedure Details Consent: Obtained Time Out: Verified patient identification, verified procedure, site/side was marked, verified correct patient position, special equipment/implants available, Radiology Safety Procedures followed,  medications/allergies/relevent history reviewed, required imaging and test results available.  Performed  Medications:  During this procedure the patient is administered a proopfol drip - total propofol 111 mg with Precidex 20 mcg by CRNA .   Left Ventrical:  Severe LV dysfunction.   EF ~10%  Mitral Valve: moderate MR , no vegetations   Aortic Valve:  Normal 3 leaflet valve , no vegetations   Tricuspid Valve: normal valve, no vegetations.  Mild TR   Pulmonic Valve:  Mild PI ,    Left Atrium/ Left atrial appendage: no thrombi   Atrial septum: nosignificant ASD or PFO by color doppler .  Bubble contrast was not used.   Aorta: normal    Complications: No apparent complications Patient did tolerate procedure well.      Cardioversion Note  Austin Cobb [id number] 20-Mar-1960  Procedure: DC Cardioversion Indications: atrial flutter   Procedure Details Consent: Obtained Time Out: Verified patient identification, verified procedure, site/side was marked, verified correct patient position, special equipment/implants available, Radiology Safety Procedures followed,  medications/allergies/relevent history reviewed, required imaging and test results available.  Performed  The patient has been on adequate anticoagulation.  The patient received IV Propofol ( see above )  for sedation.  Synchronous cardioversion was performed at  75  joules.  The cardioversion was successful     Complications: No apparent complications Patient did tolerate procedure well.   Thayer Headings, Brooke Bonito., MD,  [facility] 05/06/2019, 8:13 AM

## 2019-05-06 NOTE — Anesthesia Postprocedure Evaluation (Signed)
Anesthesia Post Note  Patient: Austin Cobb  Procedure(s) Performed: TRANSESOPHAGEAL ECHOCARDIOGRAM (TEE) (N/A ) CARDIOVERSION (N/A )     Patient location during evaluation: Endoscopy Anesthesia Type: MAC Level of consciousness: awake and alert Pain management: pain level controlled Vital Signs Assessment: post-procedure vital signs reviewed and stable Respiratory status: spontaneous breathing, nonlabored ventilation, respiratory function stable and patient connected to nasal cannula oxygen Cardiovascular status: blood pressure returned to baseline and stable Postop Assessment: no apparent nausea or vomiting Anesthetic complications: no    Last Vitals:  Vitals:   05/06/19 0850 05/06/19 0940  BP: (!) 86/63   Pulse: 72 (!) 110  Resp: 20 17  Temp:    SpO2: 97% 99%    Last Pain:  Vitals:   05/06/19 0940  TempSrc:   PainSc: 0-No pain                 Barnet Glasgow

## 2019-05-06 NOTE — Progress Notes (Signed)
PROGRESS NOTE  Austin Cobb [id number] DOB: October 01, 1960   PCP: Patient, No Pcp Per  Patient is from: Home.  Lives with a friend.  Ambulates independently at baseline.  DOA: 05/04/2019 LOS: 2  Brief Narrative / Interim history: 59 year old male with history of tobacco, EtOH and polysubstance abuse, bipolar disorder, anxiety and depression presenting with progressive dyspnea for 1 day and exertional dyspnea and fatigue for over 2 months.  Also had paroxysmal nocturnal dyspnea.   In ED, found to be in A.  Flutter with RVR with HR in the 130s.  Also hypertensive with SBP in 170s and DBP in 130s.  UDS negative.  Mildly elevated AST and ALT.BNP 2000.  High-sensitivity troponin 48> 50.  EKG revealed a flutter and LVH.  Lactic acid 2.1> 1.7.  Hgb 11.7.  UA with large LE and many bacteria.  POC COVID-19 test negative.  Cardiology consulted.  Patient admitted for atrial flutter with RVR and unknown type acute CHF.   D-dimer elevated.  CTA chest negative for PE.  Echo with EF of 10 to 15%, global hypokinesis, unknown diastolic function, severely reduced RVEF and severely enlarged RV with RVSP of 28.2 mmHg.   Patient underwent TEE guided cardioversion on 05/06/2019 and converted to NSR. TEE with EF of 10%, moderate MR, no thrombus or significant ASD or PFO.   Subjective: No major events overnight or this morning.  Had TEE with cardioversion this morning as above.  He says he feels tired and weak but no other complaints.  He denies chest pain, dyspnea or palpitation.  Denies GI or UTI symptoms.  He likes to eat breakfast.  Objective: Vitals:   05/06/19 0835 05/06/19 0850 05/06/19 0940 05/06/19 1005  BP: 90/64 (!) 86/63  (!) 123/98  Pulse: 62 72 (!) 110 (!) 110  Resp: 12 20 17    Temp:      TempSrc:      SpO2: 93% 97% 99%   Weight:      Height:        Intake/Output Summary (Last 24 hours) at 05/06/2019 1131 Last data filed at 05/06/2019 0816 Gross per 24 hour  Intake 390 ml  Output --   Net 390 ml   Filed Weights   05/04/19 2100 05/05/19 0500 05/06/19 0706  Weight: 61.6 kg 61 kg 61 kg    Examination:  GENERAL: No acute distress.  Appears well.  HEENT: MMM.  Vision and hearing grossly intact.  NECK: Supple.  No apparent JVD.  RESP:  No IWOB. Good air movement bilaterally. CVS: Tachycardic to 110s.  Regular rhythm. Heart sounds normal.  ABD/GI/GU: Bowel sounds present. Soft. Non tender.  MSK/EXT:  Moves extremities. No apparent deformity. No edema.  SKIN: no apparent skin lesion or wound NEURO: Awake, alert and oriented appropriately.  No apparent focal neuro deficit. PSYCH: Calm. Normal affect.  Procedures:  2/24-TTE with EF of 10 to 15%, global hypokinesis, unknown diastolic function, severely reduced RVEF and severely enlarged RV with RVSP of 28.2 mmHg.   2/25-TEE with EF of 10%, moderate MR, no thrombus or significant ASD or PFO.  Successful cardioversion with conversion to NSR in 60's but now HR in 110.  Assessment & Plan: Atrial flutter with RVR: HR remains in 120s to 130s despite metoprolol.  CHA2DS2-VASc score of 2.  TTE and TEE as above -2/25-successful TEE/DCCV on 2/25. Converted to NSR but HR now in 110's. -On metoprolol and Eliquis per cardiology  Acute combined CHF: TTE ENT as above.  Presented with  cardinal symptoms.  BNP 2000.  LVH on EKG.  CXR with cardiomegaly and small right pleural effusion.  Has prominent JVD on exam.  CTA negative for PE but bilateral pleural effusion.  Intake and output incomplete. Cr 1.19 (admit)> 1.42> 1.52.  -Diuretics per cardiology-holding Lasix in the setting of AKI today -Monitor fluid status, renal function and lytes   Acute kidney injury/azotemia: Baseline Cr ~1.1>>1.19 (admit)> 1.42> 1.52. Likely a combination of CHF/atrial flutter, diuretics and possibly contrast from CTA -Agree with holding diuretics today. -Recheck in the morning  Elevated d-dimer: CTA negative for PE but moderate bilateral pleural effusions,  right > left.   Elevated troponin: Likely demand ischemia from a flutter and CHF.  TTE and TEE as above. -Cardiology following  Elevated liver enzymes: Likely due to congestive hepatopathy. -Check CK and acute hepatitis panel -Manage CHF as above  Hypomagnesemia: Resolved.  E. coli UTI: Patient have symptoms although chronic.  UA and urine culture concerning. -Continue ceftriaxone pending urine culture sensitivity.  Essential hypertension: Soft blood pressures after DCCV but normotensive now. -Cardiac meds as above  History of polysubstance use -Cessation counseling -Consult CSW for resources  Tobacco use disorder: Smokes about 1 pack a day. -Encourage cessation -Nicotine patch  Alcohol use disorder: Reports history of DTs. -Continue CIWA with as needed Ativan -Continue vitamins  Generalized weakness/debility -PT/OT eval  Euthyroid sick syndrome: TSH 5.4.  Free T4 1.68 -Repeat thyroid panel in 4 to 6 weeks outpatient.                DVT prophylaxis: On Eliquis for anticoagulation Code Status: Full code Family Communication: Patient and/or RN. Available if any question.  Discharge barrier: Atrial flutter, acute CHF and AKI Patient is from: Home.  Lives with friend. Final disposition: To be determined based on clinical progress  Consultants: Cardiology   Microbiology summarized: [id number] negative.  Sch Meds:  Scheduled Meds: . apixaban  5 mg Oral BID  . folic acid  1 mg Oral Daily  . Living Better with Heart Failure Book   Does not apply Once  . [START ON 05/07/2019] metoprolol succinate  100 mg Oral Daily  . multivitamin with minerals  1 tablet Oral Daily  . nicotine  21 mg Transdermal Daily  . thiamine  100 mg Oral Daily   Or  . thiamine  100 mg Intravenous Daily   Continuous Infusions: . cefTRIAXone (ROCEPHIN)  IV Stopped (05/05/19 2030)   PRN Meds:.LORazepam **OR** LORazepam, metoprolol tartrate  Antimicrobials: Anti-infectives (From  admission, onward)   Start     Dose/Rate Route Frequency Ordered Stop   05/05/19 1900  cefTRIAXone (ROCEPHIN) 1 g in sodium chloride 0.9 % 100 mL IVPB     1 g 200 mL/hr over 30 Minutes Intravenous Every 24 hours 05/04/19 2110     05/04/19 1730  cefTRIAXone (ROCEPHIN) 1 g in sodium chloride 0.9 % 100 mL IVPB     1 g 200 mL/hr over 30 Minutes Intravenous  Once 05/04/19 1724 05/04/19 2005       I have personally reviewed the following labs and images: CBC: Recent Labs  Lab 05/04/19 1540 05/04/19 2144 05/05/19 0130 05/06/19 0549  WBC 6.5 5.8 6.2 7.3  NEUTROABS 4.2  --   --   --   HGB 11.7* 11.8* 12.1* 11.3*  HCT 35.2* 35.3* 37.2* 33.1*  MCV 96.2 93.6 95.6 92.5  PLT 152 167 170 156   BMP &GFR Recent Labs  Lab 05/04/19 1540 05/04/19 2144 05/05/19 0130  05/05/19 1006 05/06/19 0549  NA 139 139 138  --  137  K 4.4 4.1 3.9  --  3.7  CL 108 105 104  --  102  CO2 21* 21* 22  --  23  GLUCOSE 124* 156* 135*  --  109*  BUN 19 20 20   --  25*  CREATININE 1.19 1.25* 1.42*  --  1.52*  CALCIUM 8.7* 8.7* 8.9  --  8.6*  MG  --  1.6*  --  1.5* 2.1  PHOS  --  4.2  --   --  5.4*   Estimated Creatinine Clearance: 45.1 mL/min (A) (by C-G formula based on SCr of 1.52 mg/dL (H)). Liver & Pancreas: Recent Labs  Lab 05/04/19 1540 05/04/19 2144 05/06/19 0549  AST 66* 77*  --   ALT 46* 53*  --   ALKPHOS 73 76  --   BILITOT 0.9 1.1  --   PROT 6.4* 6.5  --   ALBUMIN 3.4* 3.5 3.4*   No results for input(s): LIPASE, AMYLASE in the last 168 hours. No results for input(s): AMMONIA in the last 168 hours. Diabetic: Recent Labs    05/05/19 1006  HGBA1C 5.3   Recent Labs  Lab 05/05/19 1810 05/05/19 2326  GLUCAP 103* 122*   Cardiac Enzymes: Recent Labs  Lab 05/05/19 1006  CKTOTAL 116   No results for input(s): PROBNP in the last 8760 hours. Coagulation Profile: Recent Labs  Lab 05/06/19 0549  INR 1.5*   Thyroid Function Tests: Recent Labs    05/04/19 2144 05/05/19 1006   TSH 5.358*  --   FREET4  --  1.68*   Lipid Profile: Recent Labs    05/05/19 1006  CHOL 76  HDL 33*  LDLCALC 35  TRIG 41  CHOLHDL 2.3   Anemia Panel: No results for input(s): VITAMINB12, FOLATE, FERRITIN, TIBC, IRON, RETICCTPCT in the last 72 hours. Urine analysis:    Component Value Date/Time   COLORURINE AMBER (A) 05/04/2019 1600   APPEARANCEUR HAZY (A) 05/04/2019 1600   LABSPEC 1.024 05/04/2019 1600   PHURINE 5.0 05/04/2019 1600   GLUCOSEU NEGATIVE 05/04/2019 1600   HGBUR NEGATIVE 05/04/2019 1600   BILIRUBINUR NEGATIVE 05/04/2019 1600   KETONESUR NEGATIVE 05/04/2019 1600   PROTEINUR 100 (A) 05/04/2019 1600   NITRITE NEGATIVE 05/04/2019 1600   LEUKOCYTESUR LARGE (A) 05/04/2019 1600   Sepsis Labs: Invalid input(s): PROCALCITONIN, [place]  Microbiology: Recent Results (from the past 240 hour(s))  Urine culture     Status: Abnormal   Collection Time: 05/04/19  3:47 PM   Specimen: Urine, Clean Catch  Result Value Ref Range Status   Specimen Description URINE, CLEAN CATCH  Final   Special Requests   Final    NONE Performed at [facility], [place] [address] [place], [place] [address]    Culture >=100,000 COLONIES/mL ESCHERICHIA COLI (A)  Final   Report Status 05/06/2019 FINAL  Final   Organism ID, Bacteria ESCHERICHIA COLI (A)  Final      Susceptibility   Escherichia coli - MIC*    AMPICILLIN 8 SENSITIVE Sensitive     CEFAZOLIN <=4 SENSITIVE Sensitive     CEFTRIAXONE <=0.25 SENSITIVE Sensitive     CIPROFLOXACIN <=0.25 SENSITIVE Sensitive     GENTAMICIN <=1 SENSITIVE Sensitive     IMIPENEM <=0.25 SENSITIVE Sensitive     NITROFURANTOIN <=16 SENSITIVE Sensitive     TRIMETH/SULFA <=20 SENSITIVE Sensitive     AMPICILLIN/SULBACTAM 4 SENSITIVE Sensitive  PIP/TAZO <=4 SENSITIVE Sensitive     * >=100,000 COLONIES/mL ESCHERICHIA COLI  SARS CORONAVIRUS 2 (TAT 6-24 HRS) Nasopharyngeal Nasopharyngeal Swab     Status: None   Collection Time: 05/05/19  8:06 AM    Specimen: Nasopharyngeal Swab  Result Value Ref Range Status   SARS Coronavirus 2 NEGATIVE NEGATIVE Final    Comment: (NOTE) SARS-CoV-2 target nucleic acids are NOT DETECTED. The SARS-CoV-2 RNA is generally detectable in upper and lower respiratory specimens during the acute phase of infection. Negative results do not preclude SARS-CoV-2 infection, do not rule out co-infections with other pathogens, and should not be used as the sole basis for treatment or other patient management decisions. Negative results must be combined with clinical observations, patient history, and epidemiological information. The expected result is Negative. Fact Sheet for Patients: SugarRoll.be Fact Sheet for Healthcare Providers: https://www.woods-mathews.com/ This test is not yet approved or cleared by the [place] FDA and  has been authorized for detection and/or diagnosis of SARS-CoV-2 by FDA under an Emergency Use Authorization (EUA). This EUA will remain  in effect (meaning this test can be used) for the duration of the COVID-19 declaration under Section 56 4(b)(1) of the Act, 21 U.S.C. section 360bbb-3(b)(1), unless the authorization is terminated or revoked sooner. Performed at [facility], [place] [address], [place], [place] [address]     Radiology Studies: CT ANGIO CHEST PE W OR WO CONTRAST  Result Date: 05/05/2019 CLINICAL DATA:  Increasing shortness of breath for 1 day. Chest pain. EXAM: CT ANGIOGRAPHY CHEST WITH CONTRAST TECHNIQUE: Multidetector CT imaging of the chest was performed using the standard protocol during bolus administration of intravenous contrast. Multiplanar CT image reconstructions and MIPs were obtained to evaluate the vascular anatomy. CONTRAST:  80 mL OMNIPAQUE IOHEXOL 350 MG/ML SOLN COMPARISON:  Single-view of the chest 05/04/2019. CT chest 05/12/2016. FINDINGS: Cardiovascular: Distal segmental branches are not opacified.  No pulmonary embolus is identified. There is marked cardiomegaly. No pericardial effusion. Scattered aortic atherosclerotic calcifications are present. Mediastinum/Nodes: No enlarged mediastinal, hilar, or axillary lymph nodes. Thyroid gland, trachea, and esophagus demonstrate no significant findings. Lungs/Pleura: Moderate bilateral pleural effusions are seen, larger on the right. Scattered mild ground-glass attenuation is likely due to atelectasis. No consolidative process, nodule, mass or pneumothorax. Upper Abdomen: Negative. Musculoskeletal: No acute or focal abnormality. Review of the MIP images confirms the above findings. IMPRESSION: Negative for pulmonary embolus. As noted above, the distal segmental branches are not opacified with contrast. Marked cardiomegaly. Moderate bilateral pleural effusions, larger on the right. Aortic Atherosclerosis (ICD10-I70.0). Electronically Signed   By: Inge Rise M.D.   On: 05/05/2019 13:46   ECHOCARDIOGRAM COMPLETE  Result Date: 05/05/2019    ECHOCARDIOGRAM REPORT   Patient Name:   SCHAD MELKA Date of Exam: 05/05/2019 Medical Rec #:  [id number]         Height:       69.0 in Accession #:    [id number]        Weight:       134.5 lb Date of Birth:  1960-04-11          BSA:          1.745 m Patient Age:    28 years          BP:           150/103 mmHg Patient Gender: M                 HR:  128 bpm. Exam Location:  Inpatient Procedure: 2D Echo, Cardiac Doppler and Color Doppler Indications:    I50.40* Unspecified combined systolic (congestive) and diastolic                 (congestive) heart failure  History:        Patient has no prior history of Echocardiogram examinations.                 Abnormal ECG, Arrythmias:Atrial Flutter, Signs/Symptoms:Dyspnea;                 Risk Factors:Current Smoker and Hypertension. ETOH. Poly                 substance abuse.  Sonographer:    Roseanna Rainbow RDCS Referring Phys: [place]  1. LVOT VTI 7  cm. CO ~3.5 L/min. CI ~2.0 L/min/m2. Left ventricular ejection fraction, by estimation, is 10-15%. The left ventricle has severely decreased function. The left ventricle demonstrates global hypokinesis. Left ventricular diastolic function could not be evaluated. Left ventricular diastolic function could not be evaluated.  2. Right ventricular systolic function is severely reduced. The right ventricular size is severely enlarged. There is normal pulmonary artery systolic pressure. The estimated right ventricular systolic pressure is Q000111Q mmHg.  3. Left atrial size was mildly dilated.  4. Right atrial size was mild to moderately dilated.  5. The mitral valve is grossly normal. Mild to moderate mitral valve regurgitation.  6. The aortic valve is tricuspid. Aortic valve regurgitation is not visualized. No aortic stenosis is present.  7. The inferior vena cava is dilated in size with <50% respiratory variability, suggesting right atrial pressure of 15 mmHg. FINDINGS  Left Ventricle: LVOT VTI 7 cm. CO ~3.5 L/min. CI ~2.0 L/min/m2. Left ventricular ejection fraction, by estimation, is 10-15%. The left ventricle has severely decreased function. The left ventricle demonstrates global hypokinesis. The left ventricular internal cavity size was normal in size. There is no left ventricular hypertrophy. Left ventricular diastolic function could not be evaluated due to atrial fibrillation. Left ventricular diastolic function could not be evaluated. Right Ventricle: The right ventricular size is severely enlarged. No increase in right ventricular wall thickness. Right ventricular systolic function is severely reduced. There is normal pulmonary artery systolic pressure. The tricuspid regurgitant velocity is 1.82 m/s, and with an assumed right atrial pressure of 15 mmHg, the estimated right ventricular systolic pressure is Q000111Q mmHg. Left Atrium: Left atrial size was mildly dilated. Right Atrium: Right atrial size was mild to  moderately dilated. Pericardium: A small pericardial effusion is present. The pericardial effusion is posterior to the left ventricle and the left atrium. Mitral Valve: The mitral valve is grossly normal. Mild to moderate mitral valve regurgitation. Tricuspid Valve: The tricuspid valve is grossly normal. Tricuspid valve regurgitation is mild. Aortic Valve: The aortic valve is tricuspid. Aortic valve regurgitation is not visualized. No aortic stenosis is present. Pulmonic Valve: The pulmonic valve was grossly normal. Pulmonic valve regurgitation is not visualized. Aorta: The aortic root and ascending aorta are structurally normal, with no evidence of dilitation. Venous: The inferior vena cava is dilated in size with less than 50% respiratory variability, suggesting right atrial pressure of 15 mmHg. IAS/Shunts: No atrial level shunt detected by color flow Doppler.  LEFT VENTRICLE PLAX 2D LVIDd:         4.60 cm     Diastology LVIDs:         4.20 cm     LV  e' lateral:   2.12 cm/s LV PW:         1.70 cm     LV E/e' lateral: 28.7 LV IVS:        1.10 cm     LV e' medial:    2.12 cm/s LVOT diam:     2.20 cm     LV E/e' medial:  28.7 LV SV:         27 LV SV Index:   15 LVOT Area:     3.80 cm  LV Volumes (MOD) LV vol d, MOD A2C: 63.8 ml LV vol d, MOD A4C: 80.9 ml LV vol s, MOD A2C: 57.9 ml LV vol s, MOD A4C: 64.5 ml LV SV MOD A2C:     5.9 ml LV SV MOD A4C:     80.9 ml LV SV MOD BP:      12.7 ml RIGHT VENTRICLE            IVC RV S prime:     4.37 cm/s  IVC diam: 3.20 cm TAPSE (M-mode): 0.9 cm LEFT ATRIUM             Index LA diam:        3.70 cm 2.12 cm/m LA Vol (A2C):   51.4 ml 29.45 ml/m LA Vol (A4C):   68.1 ml 39.02 ml/m LA Biplane Vol: 59.3 ml 33.98 ml/m  AORTIC VALVE LVOT Vmax:   49.90 cm/s LVOT Vmean:  35.000 cm/s LVOT VTI:    0.070 m  AORTA Ao Root diam: 3.50 cm Ao Asc diam:  3.20 cm MITRAL VALVE                 TRICUSPID VALVE MV Area (PHT): 6.24 cm      TR Peak grad:   13.2 mmHg MV Decel Time: 122 msec      TR  Vmax:        182.00 cm/s MR Peak grad:    88.0 mmHg MR Mean grad:    48.0 mmHg   SHUNTS MR Vmax:         469.00 cm/s Systemic VTI:  0.07 m MR Vmean:        306.0 cm/s  Systemic Diam: 2.20 cm MR PISA:         0.57 cm MR PISA Eff ROA: 6 mm MR PISA Radius:  0.30 cm MV E velocity: 60.90 cm/s Eleonore Chiquito MD Electronically signed by Eleonore Chiquito MD Signature Date/Time: 05/05/2019/6:15:39 PM    Final    VAS [place] LOWER EXTREMITY VENOUS (DVT)  Result Date: 05/06/2019  Lower Venous DVTStudy Indications: Elevated Ddimer.  Risk Factors: None identified. Comparison Study: No prior studies. Performing Technologist: Oliver Hum RVT  Examination Guidelines: A complete evaluation includes B-mode imaging, spectral Doppler, color Doppler, and power Doppler as needed of all accessible portions of each vessel. Bilateral testing is considered an integral part of a complete examination. Limited examinations for reoccurring indications may be performed as noted. The reflux portion of the exam is performed with the patient in reverse Trendelenburg.  +---------+---------------+---------+-----------+----------+--------------+ RIGHT    CompressibilityPhasicitySpontaneityPropertiesThrombus Aging +---------+---------------+---------+-----------+----------+--------------+ CFV      Full           Yes      Yes                                 +---------+---------------+---------+-----------+----------+--------------+ SFJ      Full                                                        +---------+---------------+---------+-----------+----------+--------------+  FV Prox  Full                                                        +---------+---------------+---------+-----------+----------+--------------+ FV Mid   Full                                                        +---------+---------------+---------+-----------+----------+--------------+ FV DistalFull                                                         +---------+---------------+---------+-----------+----------+--------------+ PFV      Full                                                        +---------+---------------+---------+-----------+----------+--------------+ POP      Full           Yes      Yes                                 +---------+---------------+---------+-----------+----------+--------------+ PTV      Full                                                        +---------+---------------+---------+-----------+----------+--------------+ PERO     Full                                                        +---------+---------------+---------+-----------+----------+--------------+   +---------+---------------+---------+-----------+----------+--------------+ LEFT     CompressibilityPhasicitySpontaneityPropertiesThrombus Aging +---------+---------------+---------+-----------+----------+--------------+ CFV      Full           Yes      Yes                                 +---------+---------------+---------+-----------+----------+--------------+ SFJ      Full                                                        +---------+---------------+---------+-----------+----------+--------------+ FV Prox  Full                                                        +---------+---------------+---------+-----------+----------+--------------+  FV Mid   Full                                                        +---------+---------------+---------+-----------+----------+--------------+ FV DistalFull                                                        +---------+---------------+---------+-----------+----------+--------------+ PFV      Full                                                        +---------+---------------+---------+-----------+----------+--------------+ POP      Full           Yes      Yes                                  +---------+---------------+---------+-----------+----------+--------------+ PTV      Full                                                        +---------+---------------+---------+-----------+----------+--------------+ PERO     Full                                                        +---------+---------------+---------+-----------+----------+--------------+     Summary: RIGHT: - There is no evidence of deep vein thrombosis in the lower extremity.  - No cystic structure found in the popliteal fossa.  LEFT: - There is no evidence of deep vein thrombosis in the lower extremity.  - No cystic structure found in the popliteal fossa.  *See table(s) above for measurements and observations.    Preliminary       Collan Schoenfeld T. [place]  If 7PM-7AM, please contact night-coverage [email or web address] Password [facility] 05/06/2019, 11:31 AM

## 2019-05-06 NOTE — Interval H&P Note (Signed)
History and Physical Interval Note:  05/06/2019 7:40 AM  Austin Cobb  has presented today for surgery, with the diagnosis of afib.  The various methods of treatment have been discussed with the patient and family. After consideration of risks, benefits and other options for treatment, the patient has consented to  Procedure(s): TRANSESOPHAGEAL ECHOCARDIOGRAM (TEE) (N/A) CARDIOVERSION (N/A) as a surgical intervention.  The patient's history has been reviewed, patient examined, no change in status, stable for surgery.  I have reviewed the patient's chart and labs.  Questions were answered to the patient's satisfaction.     [name]

## 2019-05-06 NOTE — Progress Notes (Addendum)
  Echocardiogram Echocardiogram Transesophageal has been performed.  Austin Cobb A Elber Galyean 05/06/2019, 8:30 AM

## 2019-05-06 NOTE — Plan of Care (Signed)

## 2019-05-06 NOTE — Care Management (Signed)
05-06-19 Benefits Check submitted for Eliquis. Graves-Bigelow, Ocie Cornfield, RN, BSN Case Manager

## 2019-05-06 NOTE — Consult Note (Addendum)
Advanced Heart Failure Team Consult Note   Primary Physician: Patient, No Pcp Per PCP-Cardiologist:  Donato Heinz, MD  AHF: New   Reason for Consultation: New Biventricular Heart Failure, Concern for Low Output  HPI:    Austin Cobb is seen today for evaluation of new biventricular heart failure at the request of Dr. Gardiner Rhyme, General Cardiology.   59 y/o male w/ h/o polysubstance abuse (tobacco and ETOH + remote cocaine and THC use), bipolar disorder and no prior cardiac history, who presented to [facility] on 2/23 w/ complaints of new exertional dyspnea, fatigue and decreased exercise tolerance. No chest pain. He was found to be markedly hypertensive in the ED, BP 165/119 and tachycardic w/ HR in the 130s. EKG showed 2:1 atrial flutter. Pertinent labs/test: COVID negative. UDS negative, HIV negtaive, Hs Trop 48>>50. BNP 2,060. Lactic acid 2.1>>1.7. K 4.1, Mg 1.6, SCr 1.25, TSH 5.4, Free T4 1.7, AST 66, ALT 46, d-dimer elevated at 1.54 but chest CT negative for PE. CXR showed moderate cardiomegaly + small rt pleural effusion.   He was started on metoprolol for rate control + IV heparin for atrial flutter and IV Lasix for CHF. 2D echo 2/24 showed biventricular failure, LV systolic function severely reduced EF 10-15% w/ global hypokinesis. RV severely enlarged w/ severely reduced systolic function. RVSP 28 mmhg. Mild to mod MR noted. TEE 2/25 negative for thrombus. Had successful DCCV today. Currently NSR, rates 70s w/ intermittent runs of atrial tach.  He has had poor response to IV lasix thus far and SCr continues to rise w/ attempts at diuresis, concerning for low output. SCr 1.25>>1.42>>1.52.   Pt not very cooperative during encounter. Sleeping. Curled up in bed w/ covers over head. Grunts when asked questions but no verbal responses. Lower extremities warm to touch. BP 123/98. Current HRs 70s-110s.      Echo 05/05/19 1. LVOT VTI 7 cm. CO ~3.5 L/min. CI ~2.0 L/min/m2. Left  ventricular ejection fraction, by estimation, is 10-15%. The left ventricle has severely decreased function. The left ventricle demonstrates global hypokinesis. Left ventricular diastolic function could not be evaluated. Left ventricular diastolic function could not be evaluated. 2. Right ventricular systolic function is severely reduced. The right ventricular size is severely enlarged. There is normal pulmonary artery systolic pressure. The estimated right ventricular systolic pressure is 22.2 mmHg. 3. Left atrial size was mildly dilated. 4. Right atrial size was mild to moderately dilated. 5. The mitral valve is grossly normal. Mild to moderate mitral valve regurgitation. 6. The aortic valve is tricuspid. Aortic valve regurgitation is not visualized. No aortic stenosis is present. 7. The inferior vena cava is dilated in size with <50% respiratory variability, suggesting right atrial pressure of 15 mmHg.  Review of Systems: [y] = yes, '[ ]'$  = no   . General: Weight gain '[ ]'$ ; Weight loss '[ ]'$ ; Anorexia '[ ]'$ ; Fatigue '[ ]'$ ; Fever '[ ]'$ ; Chills '[ ]'$ ; Weakness '[ ]'$   . Cardiac: Chest pain/pressure '[ ]'$ ; Resting SOB '[ ]'$ ; Exertional SOB '[ ]'$ ; Orthopnea '[ ]'$ ; Pedal Edema '[ ]'$ ; Palpitations '[ ]'$ ; Syncope '[ ]'$ ; Presyncope '[ ]'$ ; Paroxysmal nocturnal dyspnea'[ ]'$   . Pulmonary: Cough '[ ]'$ ; Wheezing'[ ]'$ ; Hemoptysis'[ ]'$ ; Sputum '[ ]'$ ; Snoring '[ ]'$   . GI: Vomiting'[ ]'$ ; Dysphagia'[ ]'$ ; Melena'[ ]'$ ; Hematochezia '[ ]'$ ; Heartburn'[ ]'$ ; Abdominal pain '[ ]'$ ; Constipation '[ ]'$ ; Diarrhea '[ ]'$ ; BRBPR '[ ]'$   . GU: Hematuria'[ ]'$ ; Dysuria '[ ]'$ ; Nocturia'[ ]'$   . Vascular: Pain in  legs with walking '[ ]'$ ; Pain in feet with lying flat '[ ]'$ ; Non-healing sores '[ ]'$ ; Stroke '[ ]'$ ; TIA '[ ]'$ ; Slurred speech '[ ]'$ ;  . Neuro: Headaches'[ ]'$ ; Vertigo'[ ]'$ ; Seizures'[ ]'$ ; Paresthesias'[ ]'$ ;Blurred vision '[ ]'$ ; Diplopia '[ ]'$ ; Vision changes '[ ]'$   . Ortho/Skin: Arthritis '[ ]'$ ; Joint pain '[ ]'$ ; Muscle pain '[ ]'$ ; Joint swelling '[ ]'$ ; Back Pain '[ ]'$ ; Rash '[ ]'$   . Psych: Depression'[ ]'$ ; Anxiety'[ ]'$    . Heme: Bleeding problems '[ ]'$ ; Clotting disorders '[ ]'$ ; Anemia '[ ]'$   . Endocrine: Diabetes '[ ]'$ ; Thyroid dysfunction'[ ]'$   Home Medications Prior to Admission medications   Medication Sig Start Date End Date Taking? Authorizing Provider  valACYclovir (VALTREX) 1000 MG tablet Take 1 tablet (1,000 mg total) by mouth 2 (two) times daily. Patient not taking: Reported on 05/04/2019 07/23/17   Horald Pollen, MD    Past Medical History: Past Medical History:  Diagnosis Date  . Anxiety   . Arthritis   . Bipolar 1 disorder ([place])   . Depression    Bipolar, not on meds/pt. stopped meds on own over 12 yrs ago  . GERD (gastroesophageal reflux disease)   . History of colon polyps   . Manic disorder ([place])   . Substance abuse ([place])    9yr ago smoke crack and marjuana  . Tobacco abuse 05/13/2012    Past Surgical History: Past Surgical History:  Procedure Laterality Date  . EXAMINATION UNDER ANESTHESIA N/A 11/12/2012   Procedure: EXAM UNDER ANESTHESIA;  Surgeon: SAdin Hector MD;  Location: [facility];  Service: General;  Laterality: N/A;  . EYE SURGERY     bilateral eye sx as a child [id number].  . Joan Mayans/A 11/12/2012   Procedure: SPJoan Mayans Surgeon: StAdin HectorMD;  Location: [facility];  Service: General;  Laterality: N/A;  . TONSILLECTOMY      Family History: Family History  Problem Relation Age of Onset  . Colon polyps Father   . Diabetes Father   . Diabetes Brother     Social History: Social History   Socioeconomic History  . Marital status: Divorced    Spouse name: Not on file  . Number of children: Not on file  . Years of education: Not on file  . Highest education level: Not on file  Occupational History  . Not on file  Tobacco Use  . Smoking status: Current Every Day Smoker    Packs/day: 1.00    Years: 35.00    Pack years: 35.00    Types: Cigarettes  . Smokeless tobacco: Never Used  Substance and Sexual Activity  . Alcohol use: Yes     Alcohol/week: 4.0 standard drinks    Types: 4 Cans of beer per week  . Drug use: Yes    Types: Cocaine, Marijuana    Comment: Quit x6 yrs ago-   . Sexual activity: Never  Other Topics Concern  . Not on file  Social History Narrative  . Not on file   Social Determinants of Health   Financial Resource Strain:   . Difficulty of Paying Living Expenses: Not on file  Food Insecurity:   . Worried About RuCharity fundraisern the Last Year: Not on file  . Ran Out of Food in the Last Year: Not on file  Transportation Needs:   . Lack of Transportation (Medical): Not on file  . Lack of Transportation (Non-Medical): Not on file  Physical Activity:   .  Days of Exercise per Week: Not on file  . Minutes of Exercise per Session: Not on file  Stress:   . Feeling of Stress : Not on file  Social Connections:   . Frequency of Communication with Friends and Family: Not on file  . Frequency of Social Gatherings with Friends and Family: Not on file  . Attends Religious Services: Not on file  . Active Member of Clubs or Organizations: Not on file  . Attends Archivist Meetings: Not on file  . Marital Status: Not on file    Allergies:  No Known Allergies  Objective:    Vital Signs:   Temp:  [97.3 F (36.3 C)-97.8 F (36.6 C)] 97.7 F (36.5 C) (02/25 0815) Pulse Rate:  [62-168] 110 (02/25 1005) Resp:  [12-20] 17 (02/25 0940) BP: (86-151)/(63-108) 123/98 (02/25 1005) SpO2:  [93 %-99 %] 99 % (02/25 0940) Weight:  [61 kg] 61 kg (02/25 0706) Last BM Date: 05/04/19  Weight change: Filed Weights   05/04/19 2100 05/05/19 0500 05/06/19 0706  Weight: 61.6 kg 61 kg 61 kg    Intake/Output:   Intake/Output Summary (Last 24 hours) at 05/06/2019 1203 Last data filed at 05/06/2019 0816 Gross per 24 hour  Intake 390 ml  Output --  Net 390 ml      Physical Exam    General:  Thin male, looks much older than actual age. Curled up in bed. No resp difficulty HEENT: normal Neck:  difficult to assess JVD (not cooperative w/ exam). Carotids 2+ bilat; no bruits. No lymphadenopathy or thyromegaly appreciated. Cor: PMI nondisplaced. Regular rhythm, tachy rate. No rubs, gallops or murmurs. Lungs: decreased BS at bases Abdomen: soft, nontender, nondistended. No hepatosplenomegaly. No bruits or masses. Good bowel sounds. Extremities: thin extremities, no cyanosis, clubbing, rash, edema Neuro: alert & orientedx3, cranial nerves grossly intact. moves all 4 extremities w/o difficulty. Affect pleasant   Telemetry   NSR/ sinus tachy, 70s-110s, intermittent runs of atrial tach   EKG    Admit EKG 2/23: 2:1 atrial flutter 130s  Labs   Basic Metabolic Panel: Recent Labs  Lab 05/04/19 1540 05/04/19 1540 05/04/19 2144 05/05/19 0130 05/05/19 1006 05/06/19 0549  NA 139  --  139 138  --  137  K 4.4  --  4.1 3.9  --  3.7  CL 108  --  105 104  --  102  CO2 21*  --  21* 22  --  23  GLUCOSE 124*  --  156* 135*  --  109*  BUN 19  --  20 20  --  25*  CREATININE 1.19  --  1.25* 1.42*  --  1.52*  CALCIUM 8.7*   < > 8.7* 8.9  --  8.6*  MG  --   --  1.6*  --  1.5* 2.1  PHOS  --   --  4.2  --   --  5.4*   < > = values in this interval not displayed.    Liver Function Tests: Recent Labs  Lab 05/04/19 1540 05/04/19 2144 05/06/19 0549  AST 66* 77*  --   ALT 46* 53*  --   ALKPHOS 73 76  --   BILITOT 0.9 1.1  --   PROT 6.4* 6.5  --   ALBUMIN 3.4* 3.5 3.4*   No results for input(s): LIPASE, AMYLASE in the last 168 hours. No results for input(s): AMMONIA in the last 168 hours.  CBC: Recent Labs  Lab 05/04/19 1540  05/04/19 2144 05/05/19 0130 05/06/19 0549  WBC 6.5 5.8 6.2 7.3  NEUTROABS 4.2  --   --   --   HGB 11.7* 11.8* 12.1* 11.3*  HCT 35.2* 35.3* 37.2* 33.1*  MCV 96.2 93.6 95.6 92.5  PLT 152 167 170 156    Cardiac Enzymes: Recent Labs  Lab 05/05/19 1006  CKTOTAL 116    BNP: BNP (last 3 results) Recent Labs    05/04/19 1540  BNP 2,060.2*     ProBNP (last 3 results) No results for input(s): PROBNP in the last 8760 hours.   CBG: Recent Labs  Lab 05/05/19 1810 05/05/19 2326  GLUCAP 103* 122*    Coagulation Studies: Recent Labs    05/06/19 0549  LABPROT 18.0*  INR 1.5*     Imaging   CT ANGIO CHEST PE W OR WO CONTRAST  Result Date: 05/05/2019 CLINICAL DATA:  Increasing shortness of breath for 1 day. Chest pain. EXAM: CT ANGIOGRAPHY CHEST WITH CONTRAST TECHNIQUE: Multidetector CT imaging of the chest was performed using the standard protocol during bolus administration of intravenous contrast. Multiplanar CT image reconstructions and MIPs were obtained to evaluate the vascular anatomy. CONTRAST:  80 mL OMNIPAQUE IOHEXOL 350 MG/ML SOLN COMPARISON:  Single-view of the chest 05/04/2019. CT chest 05/12/2016. FINDINGS: Cardiovascular: Distal segmental branches are not opacified. No pulmonary embolus is identified. There is marked cardiomegaly. No pericardial effusion. Scattered aortic atherosclerotic calcifications are present. Mediastinum/Nodes: No enlarged mediastinal, hilar, or axillary lymph nodes. Thyroid gland, trachea, and esophagus demonstrate no significant findings. Lungs/Pleura: Moderate bilateral pleural effusions are seen, larger on the right. Scattered mild ground-glass attenuation is likely due to atelectasis. No consolidative process, nodule, mass or pneumothorax. Upper Abdomen: Negative. Musculoskeletal: No acute or focal abnormality. Review of the MIP images confirms the above findings. IMPRESSION: Negative for pulmonary embolus. As noted above, the distal segmental branches are not opacified with contrast. Marked cardiomegaly. Moderate bilateral pleural effusions, larger on the right. Aortic Atherosclerosis (ICD10-I70.0). Electronically Signed   By: Inge Rise M.D.   On: 05/05/2019 13:46   ECHOCARDIOGRAM COMPLETE  Result Date: 05/05/2019    ECHOCARDIOGRAM REPORT   Patient Name:   Austin Cobb Date of  Exam: 05/05/2019 Medical Rec #:  [id number]         Height:       69.0 in Accession #:    [id number]        Weight:       134.5 lb Date of Birth:  [birth year]          BSA:          1.745 m Patient Age:    23 years          BP:           150/103 mmHg Patient Gender: M                 HR:           128 bpm. Exam Location:  Inpatient Procedure: 2D Echo, Cardiac Doppler and Color Doppler Indications:    I50.40* Unspecified combined systolic (congestive) and diastolic                 (congestive) heart failure  History:        Patient has no prior history of Echocardiogram examinations.                 Abnormal ECG, Arrythmias:Atrial Flutter, Signs/Symptoms:Dyspnea;  Risk Factors:Current Smoker and Hypertension. ETOH. Poly                 substance abuse.  Sonographer:    Roseanna Rainbow RDCS Referring Phys: [place]  1. LVOT VTI 7 cm. CO ~3.5 L/min. CI ~2.0 L/min/m2. Left ventricular ejection fraction, by estimation, is 10-15%. The left ventricle has severely decreased function. The left ventricle demonstrates global hypokinesis. Left ventricular diastolic function could not be evaluated. Left ventricular diastolic function could not be evaluated.  2. Right ventricular systolic function is severely reduced. The right ventricular size is severely enlarged. There is normal pulmonary artery systolic pressure. The estimated right ventricular systolic pressure is 74.8 mmHg.  3. Left atrial size was mildly dilated.  4. Right atrial size was mild to moderately dilated.  5. The mitral valve is grossly normal. Mild to moderate mitral valve regurgitation.  6. The aortic valve is tricuspid. Aortic valve regurgitation is not visualized. No aortic stenosis is present.  7. The inferior vena cava is dilated in size with <50% respiratory variability, suggesting right atrial pressure of 15 mmHg. FINDINGS  Left Ventricle: LVOT VTI 7 cm. CO ~3.5 L/min. CI ~2.0 L/min/m2. Left ventricular ejection fraction,  by estimation, is 10-15%. The left ventricle has severely decreased function. The left ventricle demonstrates global hypokinesis. The left ventricular internal cavity size was normal in size. There is no left ventricular hypertrophy. Left ventricular diastolic function could not be evaluated due to atrial fibrillation. Left ventricular diastolic function could not be evaluated. Right Ventricle: The right ventricular size is severely enlarged. No increase in right ventricular wall thickness. Right ventricular systolic function is severely reduced. There is normal pulmonary artery systolic pressure. The tricuspid regurgitant velocity is 1.82 m/s, and with an assumed right atrial pressure of 15 mmHg, the estimated right ventricular systolic pressure is 27.0 mmHg. Left Atrium: Left atrial size was mildly dilated. Right Atrium: Right atrial size was mild to moderately dilated. Pericardium: A small pericardial effusion is present. The pericardial effusion is posterior to the left ventricle and the left atrium. Mitral Valve: The mitral valve is grossly normal. Mild to moderate mitral valve regurgitation. Tricuspid Valve: The tricuspid valve is grossly normal. Tricuspid valve regurgitation is mild. Aortic Valve: The aortic valve is tricuspid. Aortic valve regurgitation is not visualized. No aortic stenosis is present. Pulmonic Valve: The pulmonic valve was grossly normal. Pulmonic valve regurgitation is not visualized. Aorta: The aortic root and ascending aorta are structurally normal, with no evidence of dilitation. Venous: The inferior vena cava is dilated in size with less than 50% respiratory variability, suggesting right atrial pressure of 15 mmHg. IAS/Shunts: No atrial level shunt detected by color flow Doppler.  LEFT VENTRICLE PLAX 2D LVIDd:         4.60 cm     Diastology LVIDs:         4.20 cm     LV e' lateral:   2.12 cm/s LV PW:         1.70 cm     LV E/e' lateral: 28.7 LV IVS:        1.10 cm     LV e' medial:     2.12 cm/s LVOT diam:     2.20 cm     LV E/e' medial:  28.7 LV SV:         27 LV SV Index:   15 LVOT Area:     3.80 cm  LV Volumes (MOD) LV vol d,  MOD A2C: 63.8 ml LV vol d, MOD A4C: 80.9 ml LV vol s, MOD A2C: 57.9 ml LV vol s, MOD A4C: 64.5 ml LV SV MOD A2C:     5.9 ml LV SV MOD A4C:     80.9 ml LV SV MOD BP:      12.7 ml RIGHT VENTRICLE            IVC RV S prime:     4.37 cm/s  IVC diam: 3.20 cm TAPSE (M-mode): 0.9 cm LEFT ATRIUM             Index LA diam:        3.70 cm 2.12 cm/m LA Vol (A2C):   51.4 ml 29.45 ml/m LA Vol (A4C):   68.1 ml 39.02 ml/m LA Biplane Vol: 59.3 ml 33.98 ml/m  AORTIC VALVE LVOT Vmax:   49.90 cm/s LVOT Vmean:  35.000 cm/s LVOT VTI:    0.070 m  AORTA Ao Root diam: 3.50 cm Ao Asc diam:  3.20 cm MITRAL VALVE                 TRICUSPID VALVE MV Area (PHT): 6.24 cm      TR Peak grad:   13.2 mmHg MV Decel Time: 122 msec      TR Vmax:        182.00 cm/s MR Peak grad:    88.0 mmHg MR Mean grad:    48.0 mmHg   SHUNTS MR Vmax:         469.00 cm/s Systemic VTI:  0.07 m MR Vmean:        306.0 cm/s  Systemic Diam: 2.20 cm MR PISA:         0.57 cm MR PISA Eff ROA: 6 mm MR PISA Radius:  0.30 cm MV E velocity: 60.90 cm/s Eleonore Chiquito MD Electronically signed by Eleonore Chiquito MD Signature Date/Time: 05/05/2019/6:15:39 PM    Final    VAS [place] LOWER EXTREMITY VENOUS (DVT)  Result Date: 05/06/2019  Lower Venous DVTStudy Indications: Elevated Ddimer.  Risk Factors: None identified. Comparison Study: No prior studies. Performing Technologist: Oliver Hum RVT  Examination Guidelines: A complete evaluation includes B-mode imaging, spectral Doppler, color Doppler, and power Doppler as needed of all accessible portions of each vessel. Bilateral testing is considered an integral part of a complete examination. Limited examinations for reoccurring indications may be performed as noted. The reflux portion of the exam is performed with the patient in reverse Trendelenburg.   +---------+---------------+---------+-----------+----------+--------------+ RIGHT    CompressibilityPhasicitySpontaneityPropertiesThrombus Aging +---------+---------------+---------+-----------+----------+--------------+ CFV      Full           Yes      Yes                                 +---------+---------------+---------+-----------+----------+--------------+ SFJ      Full                                                        +---------+---------------+---------+-----------+----------+--------------+ FV Prox  Full                                                        +---------+---------------+---------+-----------+----------+--------------+  FV Mid   Full                                                        +---------+---------------+---------+-----------+----------+--------------+ FV DistalFull                                                        +---------+---------------+---------+-----------+----------+--------------+ PFV      Full                                                        +---------+---------------+---------+-----------+----------+--------------+ POP      Full           Yes      Yes                                 +---------+---------------+---------+-----------+----------+--------------+ PTV      Full                                                        +---------+---------------+---------+-----------+----------+--------------+ PERO     Full                                                        +---------+---------------+---------+-----------+----------+--------------+   +---------+---------------+---------+-----------+----------+--------------+ LEFT     CompressibilityPhasicitySpontaneityPropertiesThrombus Aging +---------+---------------+---------+-----------+----------+--------------+ CFV      Full           Yes      Yes                                  +---------+---------------+---------+-----------+----------+--------------+ SFJ      Full                                                        +---------+---------------+---------+-----------+----------+--------------+ FV Prox  Full                                                        +---------+---------------+---------+-----------+----------+--------------+ FV Mid   Full                                                        +---------+---------------+---------+-----------+----------+--------------+  FV DistalFull                                                        +---------+---------------+---------+-----------+----------+--------------+ PFV      Full                                                        +---------+---------------+---------+-----------+----------+--------------+ POP      Full           Yes      Yes                                 +---------+---------------+---------+-----------+----------+--------------+ PTV      Full                                                        +---------+---------------+---------+-----------+----------+--------------+ PERO     Full                                                        +---------+---------------+---------+-----------+----------+--------------+     Summary: RIGHT: - There is no evidence of deep vein thrombosis in the lower extremity.  - No cystic structure found in the popliteal fossa.  LEFT: - There is no evidence of deep vein thrombosis in the lower extremity.  - No cystic structure found in the popliteal fossa.  *See table(s) above for measurements and observations.    Preliminary    [place] EKG SITE RITE  Result Date: 05/06/2019 If [facility] image not attached, placement could not be confirmed due to current cardiac rhythm.     Medications:     Current Medications: . apixaban  5 mg Oral BID  . folic acid  1 mg Oral Daily  . Living Better with Heart Failure Book   Does not  apply Once  . [START ON 05/07/2019] metoprolol succinate  50 mg Oral Daily  . multivitamin with minerals  1 tablet Oral Daily  . nicotine  21 mg Transdermal Daily  . thiamine  100 mg Oral Daily   Or  . thiamine  100 mg Intravenous Daily     Infusions: . cefTRIAXone (ROCEPHIN)  IV Stopped (05/05/19 2030)      Assessment/Plan   1. Biventricular Heart Failure: Admitted 2/23 w/ NYHA Class III symptoms. BNP 2,060, CXR w/ cardiomegaly and rt pleural effusion. 2D echo w/ EF 10-15% w/ diffuse hypokinesis. RV severely enlarged w/ severely reduced systolic function. RVSP 28 mmhg. Mild to mod MR noted.  - Suspect likely NICM given biventricular involvement and diffuse hypokinesis, however will need LHC at some point to exclude CAD. Plan RHC to assess filling pressures and output. Most likely tachy mediated from rapid atrial flutter. Uncontrolled/untreated HTN also likely contributor given BP trend.  - Poor  urinary response to IV Lasix thus far w/ rising SCr w/ attempts at diuresis, raising concern for low output.  - Place PICC line to check Co-ox and monitor CVP. If low co-ox, will start milrinone and will need to continue IV amiodarone to help keep in rhythm.  - may need to hold  blocker if low co-ox.  - can add digoxin but will need to monitor renal function closely  - can start low dose spiro - hold addition of ARB/ARNI given rising SCr and plans for potential LHC - Not a transplant candidate w/ substance abuse. Not a candidate for LVAD w/ RV failure  - w/ concomitant AFL and HTN, will need outpatient sleep study to r/o sleep apnea (if agreeable) - likely will need long term a/c for LV thrombus prophylaxis (continue Eliquis) - If SCr improves <1.5, plan cMRI to r/o myocarditis. Will check ESR     2. AKI: SCr rising in the setting of BiV failure. SCr 1.25>>1.42>>1.52. Concern for low output - Place PICC line. Start inotrope if co-ox low - Follow BMP daily   3. Atrial Flutter: 2:1 atrial  flutter on admit. S/p TEE/DCCV 2/25. Currently sinus tach, 110s. K 3.7. Mg 2.1. TSH 5.4. Free T4 mildly elevated at 1.68. Echo w/ mildly dilated LA. RA mild-moderately dilated.  - Continue IV amiodarone - Continue Eliquis 5 mg bid  - ? If ablation candidate. May need EP to evaluate  - Keep K >4.0 and Mg >2.0 - Repeat TFTs as outpatient  - Outpatient sleep study to r/o sleep apnea   4. UTI: - on ceftriaxone per primary  5. ETOH: - drinks ~4 beers daily on average - Continue CIWA protocol  - abstinence imperative given HF and ALF  6. Tobacco Abuse: - nicotine patch   Medication concerns reviewed with patient and pharmacy team. Barriers identified: concerns regarding potential poor compliance w/ phychiatric history and poor social support.  Need to involve social work. He lives in [facility], can arrange paramedicine once discharged.   Length of Stay: 2  Lyda Jester, PA-C  05/06/2019, 12:03 PM  Advanced Heart Failure Team Pager [phone] (M-F; 7a - 4p)  Please contact [facility] for night-coverage after hours (4p -7a ) and weekends on [email or web address]  Patient seen with PA, agree with the above note.   He was admitted this week with dyspnea, found to be in atrial flutter with RVR.  No prior history of heart disease. He reports about 1 month of gradually worsening exertional dyspnea and fatigue. About 1 week ago, he started to feel palpitations.  No chest pain. He tells me that he drinks at least 2 "40s" a day.  He gets "shaky" when he quits drinking.  History of bipolar disorder.   Echo this admission showed EF 15% with severe RV dysfunction.  He went for TEE-guided DCCV this morning and is in NSR today.   General: NAD Neck: JVP 14-16 cm, no thyromegaly or thyroid nodule.  Lungs: Clear to auscultation bilaterally with normal respiratory effort. CV: Lateral PMI.  Heart regular S1/S2, no S3/S4, no murmur.  No peripheral edema.   Abdomen: Soft, nontender, no  hepatosplenomegaly, no distention.  Skin: Intact without lesions or rashes.  Neurologic: Alert and oriented x 3.  Psych: Normal affect. Extremities: No clubbing or cyanosis.  HEENT: Normal.   1. Acute systolic CHF: Echo this admission with EF 15%, severe RV dysfunction.  No prior cardiac history.  Etiology is uncertain => could be tachycardia-mediated CMP but exertional  dyspnea pre-dated palpitations, could be due to long-standing heavy ETOH use, could be viral cardiomyopathy, cannot rule out CAD. He has now had DCCV and is in NSR.  On exam, he is significantly volume overloaded, NYHA class IIIb symptoms. He has not diuresed much.  Creatinine has increased to 1.5. I do have some concern for low output HF with cardiorenal syndrome.  - PICC ordered for today, will follow CVP and co-ox.  - If creatinine remains stable, will plan LHC/RHC tomorrow.  - If co-ox is low, start milrinone 0.25 mcg/kg/min.  - He has not had Lasix today, start Lasix 80 mg IV bid.  - Start digoxin 0.125 daily.  - Hold Toprol XL for now.  2. Atrial flutter: AFL with RVR at admission, uncertain how long.  He had felt palpitations for about a week.  Possible component of tachy-mediated CMP.  He is s/p TEE-guided DCCV and is in NSR.  - Continue amiodarone gtt for now.  - Consider ablation down the road, especially if he can stop drinking.  - Hold Eliquis for now and cover with heparin gtt.  Will hold heparin briefly for cath potentially tomorrow depending on creatinine.  3. ETOH abuse: Strongly encouraged him to quit.  Rehab program would be helpful. He is on CIWA protocol.  4. AKI: Suspect cardiorenal, possibly with low output.  He is volume overloaded so will continue IV Lasix.  As above, will place PICC today to see if milrinone needed.   Loralie Champagne 05/06/2019 3:17 PM

## 2019-05-06 NOTE — Progress Notes (Signed)
ANTICOAGULATION CONSULT NOTE - Initial Consult  Pharmacy Consult for apixaban>>heparin Indication: atrial fibrillation  No Known Allergies  Patient Measurements: Height: 5\' 9"  (175.3 cm) Weight: 134 lb 7.7 oz (61 kg) IBW/kg (Calculated) : 70.7   Vital Signs: Temp: 97.7 F (36.5 C) (02/25 0815) Temp Source: Temporal (02/25 0815) BP: 95/71 (02/25 1334) Pulse Rate: 88 (02/25 1412)  Labs: Recent Labs    05/04/19 1540 05/04/19 1540 05/04/19 1719 05/04/19 2144 05/04/19 2144 05/05/19 0130 05/05/19 0711 05/05/19 1006 05/06/19 0549  HGB 11.7*   < >  --  11.8*   < > 12.1*  --   --  11.3*  HCT 35.2*   < >  --  35.3*  --  37.2*  --   --  33.1*  PLT 152   < >  --  167  --  170  --   --  156  LABPROT  --   --   --   --   --   --   --   --  18.0*  INR  --   --   --   --   --   --   --   --  1.5*  HEPARINUNFRC  --   --   --   --   --  0.75* 0.62  --   --   CREATININE 1.19   < >  --  1.25*  --  1.42*  --   --  1.52*  CKTOTAL  --   --   --   --   --   --   --  116  --   TROPONINIHS 48*  --  50*  --   --   --   --   --   --    < > = values in this interval not displayed.    Estimated Creatinine Clearance: 45.1 mL/min (A) (by C-G formula based on SCr of 1.52 mg/dL (H)).   Medical History: Past Medical History:  Diagnosis Date  . Anxiety   . Arthritis   . Bipolar 1 disorder ([place])   . Depression    Bipolar, not on meds/pt. stopped meds on own over 12 yrs ago  . GERD (gastroesophageal reflux disease)   . History of colon polyps   . Manic disorder ([place])   . Substance abuse ([place])    97yrs ago smoke crack and marjuana  . Tobacco abuse 05/13/2012   Assessment: 59 year old male with afib, started on apixaban 2/24 and now will transition back to heparin in prep for cath tomorrow afternoon.   CBC stable, no bleeding noted. Apixaban taken this am, will start heparin tonight. Aptt and heparin level with am labs.   Goal of Therapy:  Monitor platelets by anticoagulation protocol: Yes   Plan:  Heparin at 1050/hr to start at 8pm tonight Aptt and heparin level in am  Erin Hearing PharmD., BCPS Clinical Pharmacist 05/06/2019 2:56 PM

## 2019-05-07 ENCOUNTER — Inpatient Hospital Stay: Payer: Self-pay

## 2019-05-07 DIAGNOSIS — G9341 Metabolic encephalopathy: Secondary | ICD-10-CM

## 2019-05-07 DIAGNOSIS — E876 Hypokalemia: Secondary | ICD-10-CM

## 2019-05-07 LAB — RENAL FUNCTION PANEL
Albumin: 3.5 g/dL (ref 3.5–5.0)
Albumin: 3.3 g/dL — ABNORMAL LOW (ref 3.5–5.0)
Anion gap: 20 — ABNORMAL HIGH (ref 5–15)
Anion gap: 20 — ABNORMAL HIGH (ref 5–15)
BUN: 33 mg/dL — ABNORMAL HIGH (ref 6–20)
BUN: 37 mg/dL — ABNORMAL HIGH (ref 6–20)
CO2: 15 mmol/L — ABNORMAL LOW (ref 22–32)
CO2: 16 mmol/L — ABNORMAL LOW (ref 22–32)
Calcium: 8.9 mg/dL (ref 8.9–10.3)
Calcium: 8.6 mg/dL — ABNORMAL LOW (ref 8.9–10.3)
Chloride: 101 mmol/L (ref 98–111)
Chloride: 103 mmol/L (ref 98–111)
Creatinine, Ser: 2.41 mg/dL — ABNORMAL HIGH (ref 0.61–1.24)
Creatinine, Ser: 2.55 mg/dL — ABNORMAL HIGH (ref 0.61–1.24)
GFR calc Af Amer: 33 mL/min — ABNORMAL LOW (ref >60)
GFR calc Af Amer: 31 mL/min — ABNORMAL LOW (ref >60)
GFR calc non Af Amer: 28 mL/min — ABNORMAL LOW (ref >60)
GFR calc non Af Amer: 26 mL/min — ABNORMAL LOW (ref >60)
Glucose, Bld: 114 mg/dL — ABNORMAL HIGH (ref 70–99)
Glucose, Bld: 122 mg/dL — ABNORMAL HIGH (ref 70–99)
Phosphorus: 7.3 mg/dL — ABNORMAL HIGH (ref 2.5–4.6)
Phosphorus: 7.2 mg/dL — ABNORMAL HIGH (ref 2.5–4.6)
Potassium: 6.2 mmol/L — ABNORMAL HIGH (ref 3.5–5.1)
Potassium: 5 mmol/L (ref 3.5–5.1)
Sodium: 136 mmol/L (ref 135–145)
Sodium: 139 mmol/L (ref 135–145)

## 2019-05-07 LAB — BASIC METABOLIC PANEL
Anion gap: 16 — ABNORMAL HIGH (ref 5–15)
BUN: 39 mg/dL — ABNORMAL HIGH (ref 6–20)
CO2: 18 mmol/L — ABNORMAL LOW (ref 22–32)
Calcium: 8.3 mg/dL — ABNORMAL LOW (ref 8.9–10.3)
Chloride: 103 mmol/L (ref 98–111)
Creatinine, Ser: 2.58 mg/dL — ABNORMAL HIGH (ref 0.61–1.24)
GFR calc Af Amer: 30 mL/min — ABNORMAL LOW (ref >60)
GFR calc non Af Amer: 26 mL/min — ABNORMAL LOW (ref >60)
Glucose, Bld: 145 mg/dL — ABNORMAL HIGH (ref 70–99)
Potassium: 4.5 mmol/L (ref 3.5–5.1)
Sodium: 137 mmol/L (ref 135–145)

## 2019-05-07 LAB — COOXEMETRY PANEL
Carboxyhemoglobin: 0.8 % (ref 0.5–1.5)
Carboxyhemoglobin: 0.7 % (ref 0.5–1.5)
Methemoglobin: 1.1 % (ref 0.0–1.5)
Methemoglobin: 0.4 % (ref 0.0–1.5)
O2 Saturation: 37.4 %
O2 Saturation: 57.3 %
Total hemoglobin: 11.7 g/dL — ABNORMAL LOW (ref 12.0–16.0)
Total hemoglobin: 10.7 g/dL — ABNORMAL LOW (ref 12.0–16.0)

## 2019-05-07 LAB — CBC
HCT: 40.5 % (ref 39.0–52.0)
Hemoglobin: 13.2 g/dL (ref 13.0–17.0)
MCH: 31.6 pg (ref 26.0–34.0)
MCHC: 32.6 g/dL (ref 30.0–36.0)
MCV: 96.9 fL (ref 80.0–100.0)
Platelets: 165 10*3/uL (ref 150–400)
RBC: 4.18 MIL/uL — ABNORMAL LOW (ref 4.22–5.81)
RDW: 14.6 % (ref 11.5–15.5)
WBC: 9.1 10*3/uL (ref 4.0–10.5)
nRBC: 0.2 % (ref 0.0–0.2)

## 2019-05-07 LAB — HEPATIC FUNCTION PANEL
ALT: 107 U/L — ABNORMAL HIGH (ref 0–44)
AST: 170 U/L — ABNORMAL HIGH (ref 15–41)
Albumin: 3.6 g/dL (ref 3.5–5.0)
Alkaline Phosphatase: 108 U/L (ref 38–126)
Bilirubin, Direct: 0.4 mg/dL — ABNORMAL HIGH (ref 0.0–0.2)
Indirect Bilirubin: 0.5 mg/dL (ref 0.3–0.9)
Total Bilirubin: 0.9 mg/dL (ref 0.3–1.2)
Total Protein: 6.7 g/dL (ref 6.5–8.1)

## 2019-05-07 LAB — APTT
aPTT: 188 seconds (ref 24–36)
aPTT: 138 seconds — ABNORMAL HIGH (ref 24–36)

## 2019-05-07 LAB — MAGNESIUM: Magnesium: 2.5 mg/dL — ABNORMAL HIGH (ref 1.7–2.4)

## 2019-05-07 LAB — AMMONIA: Ammonia: 60 umol/L — ABNORMAL HIGH (ref 9–35)

## 2019-05-07 LAB — SEDIMENTATION RATE: Sed Rate: 7 mm/hr (ref 0–16)

## 2019-05-07 LAB — HEPARIN LEVEL (UNFRACTIONATED): Heparin Unfractionated: >2.2 IU/mL — ABNORMAL HIGH (ref 0.30–0.70)

## 2019-05-07 LAB — C-REACTIVE PROTEIN: CRP: <0.5 mg/dL (ref <1.0)

## 2019-05-07 MED ORDER — LACTULOSE 10 GM/15ML PO SOLN
20.0000 g | Freq: Three times a day (TID) | ORAL | Status: DC
  Administered 2019-05-07 (×2): 20 g via ORAL
  Filled 2019-05-07 (×2): qty 30

## 2019-05-07 MED ORDER — MILRINONE LACTATE IN DEXTROSE 20-5 MG/100ML-% IV SOLN
0.1250 ug/kg/min | INTRAVENOUS | Status: DC
  Administered 2019-05-07 – 2019-05-09 (×3): 0.25 ug/kg/min via INTRAVENOUS
  Filled 2019-05-07 (×3): qty 100

## 2019-05-07 MED ORDER — CHLORHEXIDINE GLUCONATE CLOTH 2 % EX PADS
6.0000 | MEDICATED_PAD | Freq: Every day | CUTANEOUS | Status: DC
  Administered 2019-05-07 – 2019-05-17 (×10): 6 via TOPICAL

## 2019-05-07 MED ORDER — LACTULOSE ENEMA
300.0000 mL | Freq: Three times a day (TID) | ORAL | Status: DC
  Filled 2019-05-07 (×3): qty 300

## 2019-05-07 MED ORDER — HEPARIN (PORCINE) 25000 UT/250ML-% IV SOLN
950.0000 [IU]/h | INTRAVENOUS | Status: DC
  Administered 2019-05-07: 900 [IU]/h via INTRAVENOUS
  Administered 2019-05-08 – 2019-05-10 (×3): 800 [IU]/h via INTRAVENOUS
  Administered 2019-05-11: 950 [IU]/h via INTRAVENOUS
  Filled 2019-05-07 (×4): qty 250

## 2019-05-07 MED ORDER — SODIUM ZIRCONIUM CYCLOSILICATE 10 G PO PACK
10.0000 g | PACK | Freq: Once | ORAL | Status: AC
  Administered 2019-05-07: 10 g via ORAL
  Filled 2019-05-07: qty 1

## 2019-05-07 MED ORDER — SODIUM CHLORIDE 0.9% FLUSH
10.0000 mL | Freq: Two times a day (BID) | INTRAVENOUS | Status: DC
  Administered 2019-05-07 – 2019-05-17 (×10): 10 mL

## 2019-05-07 MED ORDER — SODIUM CHLORIDE 0.9% FLUSH: 10.0000 mL | INTRAVENOUS | Status: DC | PRN

## 2019-05-07 MED ORDER — ISOSORB DINITRATE-HYDRALAZINE 20-37.5 MG PO TABS
0.5000 | ORAL_TABLET | Freq: Three times a day (TID) | ORAL | Status: DC
  Administered 2019-05-07 (×2): 0.5 via ORAL
  Filled 2019-05-07 (×2): qty 1

## 2019-05-07 NOTE — Progress Notes (Signed)
PROGRESS NOTE  Austin Cobb [id number] DOB: 1960/04/25   PCP: Patient, No Pcp Per  Patient is from: Home.  Lives with a friend.  Ambulates independently at baseline.  DOA: 05/04/2019 LOS: 3  Brief Narrative / Interim history: 59 year old male with history of tobacco, EtOH and polysubstance abuse, bipolar disorder, anxiety and depression presenting with progressive dyspnea for 1 day and exertional dyspnea and fatigue for over 2 months.  Also had paroxysmal nocturnal dyspnea.   In ED, found to be in A.  Flutter with RVR with HR in the 130s.  Also hypertensive with SBP in 170s and DBP in 130s.  UDS negative.  Mildly elevated AST and ALT.BNP 2000.  High-sensitivity troponin 48> 50.  EKG revealed a flutter and LVH.  Lactic acid 2.1> 1.7.  Hgb 11.7.  UA with large LE and many bacteria.  POC COVID-19 test negative.  Cardiology consulted.  Patient admitted for atrial flutter with RVR and unknown type acute CHF.   D-dimer elevated.  CTA chest negative for PE.  Echo with EF of 10 to 15%, global hypokinesis, unknown diastolic function, severely reduced RVEF and severely enlarged RV with RVSP of 28.2 mmHg.   Patient underwent TEE guided cardioversion on 05/06/2019 and converted to NSR. TEE with EF of 10%, moderate MR, no thrombus or significant ASD or PFO.   Advanced heart failure team involved.  Started high-dose IV Lasix but renal function gotten worse.  Subjective: No major events overnight.  He is somewhat sleepy this morning. Barely responds to question or follows command.  He had multiple doses of Ativan for alcohol withdrawal.  Hemodynamically stable.  No respiratory distress.  No apparent focal neuro deficits.   Objective: Vitals:   05/07/19 0720 05/07/19 0819 05/07/19 0900 05/07/19 1351  BP: 131/87 (!) 129/91 (!) 134/103 (!) 140/97  Pulse: 73 77 80 82  Resp:  20  16  Temp:  97.9 F (36.6 C)  (!) 97.5 F (36.4 C)  TempSrc:  Axillary  Axillary  SpO2: 90% 92%  100%  Weight:       Height:        Intake/Output Summary (Last 24 hours) at 05/07/2019 1402 Last data filed at 05/06/2019 2143 Gross per 24 hour  Intake 443 ml  Output --  Net 443 ml   Filed Weights   05/05/19 0500 05/06/19 0706 05/07/19 0500  Weight: 61 kg 61 kg 60.8 kg    Examination:  GENERAL: Somnolent.  Chronically ill-appearing. HEENT: MMM.  Vision and hearing grossly intact.  NECK: Supple.  Prominent JVD. RESP:  No IWOB. Good air movement bilaterally. CVS:  RRR. Heart sounds normal.  ABD/GI/GU: Bowel sounds present. Soft. Non tender.  MSK/EXT:  Moves extremities. No apparent deformity. No edema.  SKIN: no apparent skin lesion or wound NEURO: Somnolent.  Barely follows command.  No apparent focal neuro deficit. PSYCH: Somnolent.  No agitation or distress.  Procedures:  2/24-TTE with EF of 10 to 15%, global hypokinesis, unknown diastolic function, severely reduced RVEF and severely enlarged RV with RVSP of 28.2 mmHg.   2/25-TEE with EF of 10%, moderate MR, no thrombus or significant ASD or PFO.  Successful cardioversion with conversion to NSR in 60's but now HR in 110.  Assessment & Plan: Acute combined CHF wheeze severely reduced RVEF: TTE ENT as above.  presented with cardinal symptoms.  BNP 2000.  LVH on EKG.  CXR with cardiomegaly and small right pleural effusion.  Has prominent JVD on exam.  CTA negative for  PE but bilateral pleural effusion.  Intake and output incomplete. Cr 1.19 (admit)> 1.42> 1.52> 2.41.  -Advanced HF team managing-diuretics on hold due to renal function. -On milrinone. -Monitor fluid status, renal function and lytes  Atrial flutter with RVR: RVR resolved.  CHA2DS2-VASc score of 2.  TTE and TEE as above.  -2/25-successful TEE/DCCV on 2/25. Converted to NSR -On amiodarone drip per cardiology.  Acute metabolic encephalopathy: Likely a combination of alcohol withdrawal, Ativan and hepatic encephalopathy.  Had significantly elevated CIWA scores to 14 and received  multiple doses of Ativan overnight and this morning.  Ammonia elevated to 60.  No respiratory distress.  No apparent focal neuro deficit. -Start lactulose -Continue CIWA with as needed Ativan -Fall, aspiration and delirium precautions  Acute kidney injury/azotemia: Baseline Cr ~1.1>>1.19 (admit)> 1.42> 1.52> 2.55. Likely a combination of CHF/atrial flutter, diuretics and possibly contrast from CTA -Agree with holding diuretics today. -Recheck in the morning  Elevated d-dimer: CTA negative for PE but moderate bilateral pleural effusions, right > left.   Elevated troponin: Likely demand ischemia from a flutter and CHF.  TTE and TEE as above. -Cardiology following  Elevated liver enzymes: Likely due to alcohol, CHF and possibly due to amiodarone.  CK within normal.  Acute hepatitis panel and HIV negative. -Manage CHF and EtOH withdrawal as above as above  Hypomagnesemia: Resolved.  E. coli UTI: Patient have symptoms although chronic.  UA and urine culture concerning. -Continue ceftriaxone pending urine culture sensitivity.  Essential hypertension: Elevated blood pressure overnight.  Normotensive this morning. -Cardiac meds as above  History of polysubstance use -Cessation counseling when able -Consult CSW for resources  Tobacco use disorder: Smokes about 1 pack a day. -Encourage cessation -Nicotine patch  Alcohol use disorder: Reports history of DTs. -Continue CIWA, as needed Ativan and vitamins  Generalized weakness/debility -PT/OT eval  Euthyroid sick syndrome: TSH 5.4.  Free T4 1.68 -Repeat thyroid panel in 4 to 6 weeks outpatient.   Hyperkalemia: Resolved.  Anion gap metabolic acidosis: Due to AKI/azotemia? -Continue monitoring.             DVT prophylaxis: On Eliquis for anticoagulation Code Status: Full code Family Communication: Patient and/or RN. Available if any question.  Discharge barrier: Acute decompensated CHF and encephalopathy Patient is from:  Home.  Lives with friend. Final disposition: To be determined based on clinical progress  Consultants: Cardiology   Microbiology summarized: [id number] negative.  Sch Meds:  Scheduled Meds: . Chlorhexidine Gluconate Cloth  6 each Topical Daily  . folic acid  1 mg Oral Daily  . lactulose  20 g Oral TID   Or  . lactulose  300 mL Rectal TID  . multivitamin with minerals  1 tablet Oral Daily  . nicotine  21 mg Transdermal Daily  . sodium chloride flush  10-40 mL Intracatheter Q12H  . sodium chloride flush  3 mL Intravenous Q12H  . thiamine  100 mg Oral Daily   Or  . thiamine  100 mg Intravenous Daily   Continuous Infusions: . amiodarone 30 mg/hr (05/07/19 1339)  . cefTRIAXone (ROCEPHIN)  IV 1 g (05/06/19 1900)  . heparin 900 Units/hr (05/07/19 0723)  . milrinone     PRN Meds:.LORazepam **OR** LORazepam, metoprolol tartrate, sodium chloride flush  Antimicrobials: Anti-infectives (From admission, onward)   Start     Dose/Rate Route Frequency Ordered Stop   05/05/19 1900  cefTRIAXone (ROCEPHIN) 1 g in sodium chloride 0.9 % 100 mL IVPB     1 g 200 mL/hr  over 30 Minutes Intravenous Every 24 hours 05/04/19 2110     05/04/19 1730  cefTRIAXone (ROCEPHIN) 1 g in sodium chloride 0.9 % 100 mL IVPB     1 g 200 mL/hr over 30 Minutes Intravenous  Once 05/04/19 1724 05/04/19 2005       I have personally reviewed the following labs and images: CBC: Recent Labs  Lab 05/04/19 1540 05/04/19 2144 05/05/19 0130 05/06/19 0549 05/07/19 0409  WBC 6.5 5.8 6.2 7.3 9.1  NEUTROABS 4.2  --   --   --   --   HGB 11.7* 11.8* 12.1* 11.3* 13.2  HCT 35.2* 35.3* 37.2* 33.1* 40.5  MCV 96.2 93.6 95.6 92.5 96.9  PLT 152 167 170 156 165   BMP &GFR Recent Labs  Lab 05/04/19 2144 05/05/19 0130 05/05/19 1006 05/06/19 0549 05/07/19 0409 05/07/19 1253  NA 139 138  --  137 136 139  K 4.1 3.9  --  3.7 6.2* 5.0  CL 105 104  --  102 101 103  CO2 21* 22  --  23 15* 16*  GLUCOSE 156* 135*  --  109*  114* 122*  BUN 20 20  --  25* 33* 37*  CREATININE 1.25* 1.42*  --  1.52* 2.41* 2.55*  CALCIUM 8.7* 8.9  --  8.6* 8.9 8.6*  MG 1.6*  --  1.5* 2.1 2.5*  --   PHOS 4.2  --   --  5.4* 7.3* 7.2*   Estimated Creatinine Clearance: 26.8 mL/min (A) (by C-G formula based on SCr of 2.55 mg/dL (H)). Liver & Pancreas: Recent Labs  Lab 05/04/19 1540 05/04/19 2144 05/06/19 0549 05/07/19 0409 05/07/19 1253  AST 66* 77*  --  170*  --   ALT 46* 53*  --  107*  --   ALKPHOS 73 76  --  108  --   BILITOT 0.9 1.1  --  0.9  --   PROT 6.4* 6.5  --  6.7  --   ALBUMIN 3.4* 3.5 3.4* 3.5  3.6 3.3*   No results for input(s): LIPASE, AMYLASE in the last 168 hours. Recent Labs  Lab 05/07/19 1206  AMMONIA 60*   Diabetic: Recent Labs    05/05/19 1006  HGBA1C 5.3   Recent Labs  Lab 05/05/19 1810 05/05/19 2326 05/06/19 2041  GLUCAP 103* 122* 111*   Cardiac Enzymes: Recent Labs  Lab 05/05/19 1006  CKTOTAL 116   No results for input(s): PROBNP in the last 8760 hours. Coagulation Profile: Recent Labs  Lab 05/06/19 0549  INR 1.5*   Thyroid Function Tests: Recent Labs    05/04/19 2144 05/05/19 1006  TSH 5.358*  --   FREET4  --  1.68*   Lipid Profile: Recent Labs    05/05/19 1006  CHOL 76  HDL 33*  LDLCALC 35  TRIG 41  CHOLHDL 2.3   Anemia Panel: No results for input(s): VITAMINB12, FOLATE, FERRITIN, TIBC, IRON, RETICCTPCT in the last 72 hours. Urine analysis:    Component Value Date/Time   COLORURINE AMBER (A) 05/04/2019 1600   APPEARANCEUR HAZY (A) 05/04/2019 1600   LABSPEC 1.024 05/04/2019 1600   PHURINE 5.0 05/04/2019 1600   GLUCOSEU NEGATIVE 05/04/2019 1600   HGBUR NEGATIVE 05/04/2019 1600   BILIRUBINUR NEGATIVE 05/04/2019 1600   KETONESUR NEGATIVE 05/04/2019 1600   PROTEINUR 100 (A) 05/04/2019 1600   NITRITE NEGATIVE 05/04/2019 1600   LEUKOCYTESUR LARGE (A) 05/04/2019 1600   Sepsis Labs: Invalid input(s): PROCALCITONIN, [place]  Microbiology: Recent  Results (  from the past 240 hour(s))  Urine culture     Status: Abnormal   Collection Time: 05/04/19  3:47 PM   Specimen: Urine, Clean Catch  Result Value Ref Range Status   Specimen Description URINE, CLEAN CATCH  Final   Special Requests   Final    NONE Performed at [facility], [address], [place], [place] [address]    Culture >=100,000 COLONIES/mL ESCHERICHIA COLI (A)  Final   Report Status 05/06/2019 FINAL  Final   Organism ID, Bacteria ESCHERICHIA COLI (A)  Final      Susceptibility   Escherichia coli - MIC*    AMPICILLIN 8 SENSITIVE Sensitive     CEFAZOLIN <=4 SENSITIVE Sensitive     CEFTRIAXONE <=0.25 SENSITIVE Sensitive     CIPROFLOXACIN <=0.25 SENSITIVE Sensitive     GENTAMICIN <=1 SENSITIVE Sensitive     IMIPENEM <=0.25 SENSITIVE Sensitive     NITROFURANTOIN <=16 SENSITIVE Sensitive     TRIMETH/SULFA <=20 SENSITIVE Sensitive     AMPICILLIN/SULBACTAM 4 SENSITIVE Sensitive     PIP/TAZO <=4 SENSITIVE Sensitive     * >=100,000 COLONIES/mL ESCHERICHIA COLI  SARS CORONAVIRUS 2 (TAT 6-24 HRS) Nasopharyngeal Nasopharyngeal Swab     Status: None   Collection Time: 05/05/19  8:06 AM   Specimen: Nasopharyngeal Swab  Result Value Ref Range Status   SARS Coronavirus 2 NEGATIVE NEGATIVE Final    Comment: (NOTE) SARS-CoV-2 target nucleic acids are NOT DETECTED. The SARS-CoV-2 RNA is generally detectable in upper and lower respiratory specimens during the acute phase of infection. Negative results do not preclude SARS-CoV-2 infection, do not rule out co-infections with other pathogens, and should not be used as the sole basis for treatment or other patient management decisions. Negative results must be combined with clinical observations, patient history, and epidemiological information. The expected result is Negative. Fact Sheet for Patients: SugarRoll.be Fact Sheet for Healthcare  Providers: https://www.woods-mathews.com/ This test is not yet approved or cleared by the [place] FDA and  has been authorized for detection and/or diagnosis of SARS-CoV-2 by FDA under an Emergency Use Authorization (EUA). This EUA will remain  in effect (meaning this test can be used) for the duration of the COVID-19 declaration under Section 56 4(b)(1) of the Act, 21 U.S.C. section 360bbb-3(b)(1), unless the authorization is terminated or revoked sooner. Performed at [facility], [place] [address], [place], [place] [address]     Radiology Studies: [place] EKG SITE RITE  Result Date: 05/07/2019 If [facility] image not attached, placement could not be confirmed due to current cardiac rhythm.     Samanda Buske T. Thompson  If 7PM-7AM, please contact night-coverage [email or web address] Password [email or web address] 05/07/2019, 2:02 PM

## 2019-05-07 NOTE — Progress Notes (Signed)
Peripherally Inserted Central Catheter/Midline Placement  The IV Nurse has discussed with the patient and/or persons authorized to consent for the patient, the purpose of this procedure and the potential benefits and risks involved with this procedure.  The benefits include less needle sticks, lab draws from the catheter, and the patient may be discharged home with the catheter. Risks include, but not limited to, infection, bleeding, blood clot (thrombus formation), and puncture of an artery; nerve damage and irregular heartbeat and possibility to perform a PICC exchange if needed/ordered by physician.  Alternatives to this procedure were also discussed.  Bard Power PICC patient education guide, fact sheet on infection prevention and patient information card has been provided to patient /or left at bedside.    PICC/Midline Placement Documentation  PICC Double Lumen [id number] PICC Right Basilic 32 cm 1 cm (Active)  Indication for Insertion or Continuance of Line Chronic illness with exacerbations (CF, Sickle Cell, etc.) 05/07/19 1139  Site Assessment Clean;Dry;Intact 05/07/19 1139  Lumen #1 Status Flushed;Saline locked;Blood return noted 05/07/19 1139  Lumen #2 Status Flushed;Saline locked;Blood return noted 05/07/19 1139  Dressing Type Transparent;Securing device 05/07/19 1139  Dressing Status Clean;Dry;Intact 05/07/19 1139  Dressing Change Due 05/14/19 05/07/19 1139   Pt became agitated and uncooperative during PICC insertion procedure. Unable to maintain sterility due to pt pulling, kicking, and swinging both arms. Multiple staff members attempted to redirect pt behaviors, but unsuccessful in attempts. PICC placed using sterile technique as much as physically possible due to pt condition.    Austin Cobb M 05/07/2019, 11:40 AM

## 2019-05-07 NOTE — Progress Notes (Signed)
[place] for Apixaban>>Heparin Indication: atrial fibrillation  No Known Allergies  Patient Measurements: Height: 5\' 9"  (175.3 cm) Weight: 134 lb 0.6 oz (60.8 kg) IBW/kg (Calculated) : 70.7   Vital Signs: Temp: 97.5 F (36.4 C) (02/26 1351) Temp Source: Axillary (02/26 1351) BP: 140/103 (02/26 1500) Pulse Rate: 81 (02/26 1500)  Labs: Recent Labs    05/04/19 1540 05/04/19 1719 05/04/19 2144 05/05/19 0130 05/05/19 0130 05/05/19 0711 05/05/19 1006 05/06/19 0549 05/06/19 0549 05/07/19 0409 05/07/19 1253 05/07/19 1357  HGB 11.7*  --    < > 12.1*   < >  --   --  11.3*  --  13.2  --   --   HCT 35.2*  --    < > 37.2*  --   --   --  33.1*  --  40.5  --   --   PLT 152  --    < > 170  --   --   --  156  --  165  --   --   APTT  --   --   --   --   --   --   --   --   --  188*  --  138*  LABPROT  --   --   --   --   --   --   --  18.0*  --   --   --   --   INR  --   --   --   --   --   --   --  1.5*  --   --   --   --   HEPARINUNFRC  --   --   --  0.75*  --  0.62  --   --   --  >2.20*  --   --   CREATININE 1.19  --    < > 1.42*   < >  --   --  1.52*   < > 2.41* 2.55* 2.58*  CKTOTAL  --   --   --   --   --   --  116  --   --   --   --   --   TROPONINIHS 48* 50*  --   --   --   --   --   --   --   --   --   --    < > = values in this interval not displayed.    Estimated Creatinine Clearance: 26.5 mL/min (A) (by C-G formula based on SCr of 2.58 mg/dL (H)).   Medical History: Past Medical History:  Diagnosis Date  . Anxiety   . Arthritis   . Bipolar 1 disorder ([place])   . Depression    Bipolar, not on meds/pt. stopped meds on own over 12 yrs ago  . GERD (gastroesophageal reflux disease)   . History of colon polyps   . Manic disorder ([place])   . Substance abuse ([place])    28yrs ago smoke crack and marjuana  . Tobacco abuse 05/13/2012   Assessment: 59 year old male with afib, started on apixaban 2/24 and now will transition back to heparin  in prep for cath tomorrow afternoon.   CBC stable, no bleeding noted. Apixaban taken this am, will start heparin tonight. Aptt and heparin level with am labs.   2/26 PM update:  APTT remain elevated   Goal of Therapy:  Heparin level 0.3-0.7  units/mL APTT 66-102 secs Monitor platelets by anticoagulation protocol: Yes   Plan:  Reduce IV heparin to 800 units/hr Recheck aPTT in 8 hrs. Daily aPTT/Heparin level and CBC.  Marguerite Olea, [facility] Clinical Pharmacist Phone [phone]  05/07/2019 3:13 PM

## 2019-05-07 NOTE — Progress Notes (Signed)
Patient ID: Austin Cobb, male   DOB: [birth year], 59 y.o.   MRN: [id number]  Co-ox up to 57% on milrinone 0.25 mcg/kg/min.  More awake.  CVP 11.  Will hold Lasix for now with AKI.  Add Bidil 0.5 tab tid with elevated BP in setting of ETOH withdrawal.   Loralie Champagne 05/07/2019 6:14 PM

## 2019-05-07 NOTE — Plan of Care (Signed)

## 2019-05-07 NOTE — Progress Notes (Signed)
   CO-OX 37%.   Start milrinone 0.25 mcg via PICC. Follow daily CO-OX and CVP.   Mendy Chou NP-C  2:01 PM

## 2019-05-07 NOTE — Progress Notes (Addendum)
Patient ID: Austin Cobb, male   DOB: 1961-02-20, 59 y.o.   MRN: [id number]     Advanced Heart Failure Rounding Note  PCP-Cardiologist: Donato Heinz, MD   Subjective:    Marked change overnight, now with suspected ETOH withdrawal and hypertensive.  He has remained in NSR in the 70s on amiodarone.  Was confused, now heavily sedated on Ativan. Creatinine up to 2.4 and K elevated at 6.2.  AST/ALT elevated, hepatitis serologies negative.   E coli UTI on ceftriaxone.    Objective:   Weight Range: 60.8 kg Body mass index is 19.79 kg/m.   Vital Signs:   Temp:  [97.5 F (36.4 C)] 97.5 F (36.4 C) (02/25 2021) Pulse Rate:  [72-110] 73 (02/26 0720) Resp:  [17-20] 17 (02/25 1643) BP: (86-158)/(63-100) 131/87 (02/26 0720) SpO2:  [90 %-99 %] 90 % (02/26 0720) Weight:  [60.8 kg] 60.8 kg (02/26 0500) Last BM Date: 05/07/19  Weight change: Filed Weights   05/05/19 0500 05/06/19 0706 05/07/19 0500  Weight: 61 kg 61 kg 60.8 kg    Intake/Output:   Intake/Output Summary (Last 24 hours) at 05/07/2019 0837 Last data filed at 05/06/2019 2143 Gross per 24 hour  Intake 443 ml  Output -  Net 443 ml      Physical Exam    General:  Sedated, sleeping HEENT: Normal Neck: Supple. JVP 14 cm. Carotids 2+ bilat; no bruits. No lymphadenopathy or thyromegaly appreciated. Cor: PMI lateral. Regular rate & rhythm. No rubs, gallops or murmurs. Lungs: Clear Abdomen: Soft, nontender, nondistended. No hepatosplenomegaly. No bruits or masses. Good bowel sounds. Extremities: No cyanosis, clubbing, rash, edema Neuro: Sedated, sleeping.  Will not arouse for me.    Telemetry   NSR 70s (personally reviewed)  Labs    CBC Recent Labs    05/04/19 1540 05/04/19 2144 05/06/19 0549 05/07/19 0409  WBC 6.5   < > 7.3 9.1  NEUTROABS 4.2  --   --   --   HGB 11.7*   < > 11.3* 13.2  HCT 35.2*   < > 33.1* 40.5  MCV 96.2   < > 92.5 96.9  PLT 152   < > 156 165   < > = values in this interval  not displayed.   Basic Metabolic Panel Recent Labs    05/06/19 0549 05/07/19 0409  NA 137 136  K 3.7 6.2*  CL 102 101  CO2 23 15*  GLUCOSE 109* 114*  BUN 25* 33*  CREATININE 1.52* 2.41*  CALCIUM 8.6* 8.9  MG 2.1 2.5*  PHOS 5.4* 7.3*   Liver Function Tests Recent Labs    05/04/19 2144 05/04/19 2144 05/06/19 0549 05/07/19 0409  AST 77*  --   --  170*  ALT 53*  --   --  107*  ALKPHOS 76  --   --  108  BILITOT 1.1  --   --  0.9  PROT 6.5  --   --  6.7  ALBUMIN 3.5   < > 3.4* 3.5  3.6   < > = values in this interval not displayed.   No results for input(s): LIPASE, AMYLASE in the last 72 hours. Cardiac Enzymes Recent Labs    05/05/19 1006  CKTOTAL 116    BNP: BNP (last 3 results) Recent Labs    05/04/19 1540  BNP 2,060.2*    ProBNP (last 3 results) No results for input(s): PROBNP in the last 8760 hours.   D-Dimer Recent Labs  05/04/19 2144  DDIMER 1.54*   Hemoglobin A1C Recent Labs    05/05/19 1006  HGBA1C 5.3   Fasting Lipid Panel Recent Labs    05/05/19 1006  CHOL 76  HDL 33*  LDLCALC 35  TRIG 41  CHOLHDL 2.3   Thyroid Function Tests Recent Labs    05/04/19 2144  TSH 5.358*    Other results:   Imaging    VAS [place] LOWER EXTREMITY VENOUS (DVT)  Result Date: 05/06/2019  Lower Venous DVTStudy Indications: Elevated Ddimer.  Risk Factors: None identified. Comparison Study: No prior studies. Performing Technologist: Oliver Hum RVT  Examination Guidelines: A complete evaluation includes B-mode imaging, spectral Doppler, color Doppler, and power Doppler as needed of all accessible portions of each vessel. Bilateral testing is considered an integral part of a complete examination. Limited examinations for reoccurring indications may be performed as noted. The reflux portion of the exam is performed with the patient in reverse Trendelenburg.  +---------+---------------+---------+-----------+----------+--------------+ RIGHT     CompressibilityPhasicitySpontaneityPropertiesThrombus Aging +---------+---------------+---------+-----------+----------+--------------+ CFV      Full           Yes      Yes                                 +---------+---------------+---------+-----------+----------+--------------+ SFJ      Full                                                        +---------+---------------+---------+-----------+----------+--------------+ FV Prox  Full                                                        +---------+---------------+---------+-----------+----------+--------------+ FV Mid   Full                                                        +---------+---------------+---------+-----------+----------+--------------+ FV DistalFull                                                        +---------+---------------+---------+-----------+----------+--------------+ PFV      Full                                                        +---------+---------------+---------+-----------+----------+--------------+ POP      Full           Yes      Yes                                 +---------+---------------+---------+-----------+----------+--------------+ PTV      Full                                                        +---------+---------------+---------+-----------+----------+--------------+  PERO     Full                                                        +---------+---------------+---------+-----------+----------+--------------+   +---------+---------------+---------+-----------+----------+--------------+ LEFT     CompressibilityPhasicitySpontaneityPropertiesThrombus Aging +---------+---------------+---------+-----------+----------+--------------+ CFV      Full           Yes      Yes                                 +---------+---------------+---------+-----------+----------+--------------+ SFJ      Full                                                         +---------+---------------+---------+-----------+----------+--------------+ FV Prox  Full                                                        +---------+---------------+---------+-----------+----------+--------------+ FV Mid   Full                                                        +---------+---------------+---------+-----------+----------+--------------+ FV DistalFull                                                        +---------+---------------+---------+-----------+----------+--------------+ PFV      Full                                                        +---------+---------------+---------+-----------+----------+--------------+ POP      Full           Yes      Yes                                 +---------+---------------+---------+-----------+----------+--------------+ PTV      Full                                                        +---------+---------------+---------+-----------+----------+--------------+ PERO     Full                                                        +---------+---------------+---------+-----------+----------+--------------+  Summary: RIGHT: - There is no evidence of deep vein thrombosis in the lower extremity.  - No cystic structure found in the popliteal fossa.  LEFT: - There is no evidence of deep vein thrombosis in the lower extremity.  - No cystic structure found in the popliteal fossa.  *See table(s) above for measurements and observations. Electronically signed by Monica Martinez MD on 05/06/2019 at 2:01:55 PM.    Final    [place] EKG SITE RITE  Result Date: 05/07/2019 If [facility] image not attached, placement could not be confirmed due to current cardiac rhythm.  [place] EKG SITE RITE  Result Date: 05/06/2019 If [facility] image not attached, placement could not be confirmed due to current cardiac rhythm.     Medications:     Scheduled Medications: . folic acid  1 mg Oral Daily  .  multivitamin with minerals  1 tablet Oral Daily  . nicotine  21 mg Transdermal Daily  . sodium chloride flush  3 mL Intravenous Q12H  . sodium zirconium cyclosilicate  10 g Oral Once  . thiamine  100 mg Oral Daily   Or  . thiamine  100 mg Intravenous Daily     Infusions: . amiodarone 30 mg/hr (05/07/19 0245)  . cefTRIAXone (ROCEPHIN)  IV 1 g (05/06/19 1900)  . heparin 900 Units/hr (05/07/19 0723)     PRN Medications:  LORazepam **OR** LORazepam, metoprolol tartrate   Assessment/Plan   1. Altered mental status/delirium: Marked change.  Suspect ETOH withdrawal with significantly higher BP and history of heavy ETOH.   - On CIWA protocol, has been getting Ativan.  2.  AKI: Suspect cardiorenal, possibly with low output.  On exam, he still appears volume overloaded despite creatinine rise.  - Hopefully, can get PICC line for central access and check co-ox.  If not, will likely start empiric milrinone as I suspect low output.  - K is high, will get Lokelma and recheck in afternoon.  Will get ECG, T waves do no appear peaked.  3. Acute systolic CHF: Echo this admission with EF 15%, severe RV dysfunction.  No prior cardiac history.  Etiology is uncertain => could be tachycardia-mediated CMP but exertional dyspnea pre-dated palpitations, could be due to long-standing heavy ETOH use, could be viral cardiomyopathy, cannot rule out CAD. He has now had DCCV and is in NSR.  On exam, he remains volume overloaded with JVD. He has not diuresed much.  Creatinine is up again.  I am concerned for low output HF.   - He will likely need inotrope, would like PICC first to get co-ox prior to starting milrinone.  If we cannot get PICC, will empirically start milrinone.   - Will cancel RHC/LHC, wait for improvement in delirium and creatinine.  - Hold Lasix for now with AKI, restart eventually when stabilized (after he is on milrinone most likely).  - Hold digoxin with AKI.  - Hold Toprol XL for now.  4.  Atrial flutter: AFL with RVR at admission, uncertain how long.  He had felt palpitations for about a week.  Possible component of tachy-mediated CMP.  He is s/p TEE-guided DCCV and is in NSR.  - Continue amiodarone gtt for now.  - Consider ablation down the road, especially if he can stop drinking.  - Hold Eliquis for now and cover with heparin gtt.   5. ETOH abuse: Strongly encouraged him to quit.  Rehab program would be helpful. Now with withdrawal as above.   Length of Stay: 3  Loralie Champagne, MD  05/07/2019, 8:37 AM  Advanced Heart Failure Team Pager [phone] (M-F; 7a - 4p)  Please contact [facility] for night-coverage after hours (4p -7a ) and weekends on [email or web address]

## 2019-05-07 NOTE — Progress Notes (Signed)
ANTICOAGULATION CONSULT NOTE  Pharmacy Consult for Apixaban>>Heparin Indication: atrial fibrillation  No Known Allergies  Patient Measurements: Height: 5\' 9"  (175.3 cm) Weight: 134 lb 7.7 oz (61 kg) IBW/kg (Calculated) : 70.7   Vital Signs: Temp: 97.5 F (36.4 C) (02/25 2021) Temp Source: Oral (02/25 2021) BP: 145/100 (02/26 0248) Pulse Rate: 78 (02/25 2021)  Labs: Recent Labs    05/04/19 1540 05/04/19 1540 05/04/19 1719 05/04/19 2144 05/04/19 2144 05/05/19 0130 05/05/19 0130 05/05/19 0711 05/05/19 1006 05/06/19 0549 05/07/19 0409  HGB 11.7*   < >  --  11.8*   < > 12.1*   < >  --   --  11.3* 13.2  HCT 35.2*   < >  --  35.3*   < > 37.2*  --   --   --  33.1* 40.5  PLT 152   < >  --  167   < > 170  --   --   --  156 165  APTT  --   --   --   --   --   --   --   --   --   --  188*  LABPROT  --   --   --   --   --   --   --   --   --  18.0*  --   INR  --   --   --   --   --   --   --   --   --  1.5*  --   HEPARINUNFRC  --   --   --   --   --  0.75*  --  0.62  --   --  >2.20*  CREATININE 1.19   < >  --  1.25*  --  1.42*  --   --   --  1.52*  --   CKTOTAL  --   --   --   --   --   --   --   --  116  --   --   TROPONINIHS 48*  --  50*  --   --   --   --   --   --   --   --    < > = values in this interval not displayed.    Estimated Creatinine Clearance: 45.1 mL/min (A) (by C-G formula based on SCr of 1.52 mg/dL (H)).   Medical History: Past Medical History:  Diagnosis Date  . Anxiety   . Arthritis   . Bipolar 1 disorder ([place])   . Depression    Bipolar, not on meds/pt. stopped meds on own over 12 yrs ago  . GERD (gastroesophageal reflux disease)   . History of colon polyps   . Manic disorder ([place])   . Substance abuse ([place])    50yrs ago smoke crack and marjuana  . Tobacco abuse 05/13/2012   Assessment: 59 year old male with afib, started on apixaban 2/24 and now will transition back to heparin in prep for cath tomorrow afternoon.   CBC stable, no bleeding  noted. Apixaban taken this am, will start heparin tonight. Aptt and heparin level with am labs.   2/26 AM update:  APTT elevated  Drawn from opposite arm on heparin per RN  Goal of Therapy:  Heparin level 0.3-0.7 units/mL APTT 66-102 secs Monitor platelets by anticoagulation protocol: Yes   Plan:  Hold heparin x 1 hr Re-start heparin at 900 units/hr  1400 aPTT  Narda Bonds, PharmD, BCPS Clinical Pharmacist Phone: [phone]

## 2019-05-08 DIAGNOSIS — I5043 Acute on chronic combined systolic (congestive) and diastolic (congestive) heart failure: Secondary | ICD-10-CM

## 2019-05-08 LAB — RENAL FUNCTION PANEL
Albumin: 3 g/dL — ABNORMAL LOW (ref 3.5–5.0)
Anion gap: 14 (ref 5–15)
BUN: 41 mg/dL — ABNORMAL HIGH (ref 6–20)
CO2: 20 mmol/L — ABNORMAL LOW (ref 22–32)
Calcium: 8 mg/dL — ABNORMAL LOW (ref 8.9–10.3)
Chloride: 102 mmol/L (ref 98–111)
Creatinine, Ser: 2.18 mg/dL — ABNORMAL HIGH (ref 0.61–1.24)
GFR calc Af Amer: 37 mL/min — ABNORMAL LOW (ref >60)
GFR calc non Af Amer: 32 mL/min — ABNORMAL LOW (ref >60)
Glucose, Bld: 143 mg/dL — ABNORMAL HIGH (ref 70–99)
Phosphorus: 4.3 mg/dL (ref 2.5–4.6)
Potassium: 3.1 mmol/L — ABNORMAL LOW (ref 3.5–5.1)
Sodium: 136 mmol/L (ref 135–145)

## 2019-05-08 LAB — COOXEMETRY PANEL
Carboxyhemoglobin: 0.9 % (ref 0.5–1.5)
Methemoglobin: 0.5 % (ref 0.0–1.5)
O2 Saturation: 66.9 %
Total hemoglobin: 10.2 g/dL — ABNORMAL LOW (ref 12.0–16.0)

## 2019-05-08 LAB — CBC
HCT: 29.8 % — ABNORMAL LOW (ref 39.0–52.0)
Hemoglobin: 10.1 g/dL — ABNORMAL LOW (ref 13.0–17.0)
MCH: 31.5 pg (ref 26.0–34.0)
MCHC: 33.9 g/dL (ref 30.0–36.0)
MCV: 92.8 fL (ref 80.0–100.0)
Platelets: 115 10*3/uL — ABNORMAL LOW (ref 150–400)
RBC: 3.21 MIL/uL — ABNORMAL LOW (ref 4.22–5.81)
RDW: 14.1 % (ref 11.5–15.5)
WBC: 8.9 10*3/uL (ref 4.0–10.5)
nRBC: 0.2 % (ref 0.0–0.2)

## 2019-05-08 LAB — MAGNESIUM: Magnesium: 2 mg/dL (ref 1.7–2.4)

## 2019-05-08 LAB — APTT
aPTT: 102 seconds — ABNORMAL HIGH (ref 24–36)
aPTT: 92 seconds — ABNORMAL HIGH (ref 24–36)

## 2019-05-08 LAB — AMMONIA: Ammonia: 28 umol/L (ref 9–35)

## 2019-05-08 LAB — HEPARIN LEVEL (UNFRACTIONATED): Heparin Unfractionated: >2.2 IU/mL — ABNORMAL HIGH (ref 0.30–0.70)

## 2019-05-08 MED ORDER — AMOXICILLIN 500 MG PO CAPS
500.0000 mg | ORAL_CAPSULE | Freq: Three times a day (TID) | ORAL | Status: AC
  Administered 2019-05-08 – 2019-05-09 (×3): 500 mg via ORAL
  Filled 2019-05-08 (×4): qty 1

## 2019-05-08 MED ORDER — LORAZEPAM 2 MG/ML IJ SOLN
1.0000 mg | INTRAMUSCULAR | Status: AC | PRN
  Administered 2019-05-08 – 2019-05-09 (×5): 4 mg via INTRAVENOUS
  Administered 2019-05-09 (×2): 2 mg via INTRAVENOUS
  Administered 2019-05-09 (×3): 4 mg via INTRAVENOUS
  Administered 2019-05-11 (×2): 2 mg via INTRAVENOUS
  Filled 2019-05-08: qty 2
  Filled 2019-05-08: qty 1
  Filled 2019-05-08: qty 2
  Filled 2019-05-08: qty 1
  Filled 2019-05-08 (×2): qty 2
  Filled 2019-05-08 (×2): qty 1
  Filled 2019-05-08 (×6): qty 2

## 2019-05-08 MED ORDER — POTASSIUM CHLORIDE CRYS ER 20 MEQ PO TBCR
40.0000 meq | EXTENDED_RELEASE_TABLET | ORAL | Status: AC
  Administered 2019-05-08 (×2): 40 meq via ORAL
  Filled 2019-05-08 (×2): qty 2

## 2019-05-08 MED ORDER — LACTULOSE ENEMA
300.0000 mL | Freq: Two times a day (BID) | ORAL | Status: DC
  Filled 2019-05-08 (×21): qty 300

## 2019-05-08 MED ORDER — LORAZEPAM 1 MG PO TABS
1.0000 mg | ORAL_TABLET | ORAL | Status: AC | PRN
  Administered 2019-05-11: 2 mg via ORAL
  Filled 2019-05-08 (×2): qty 2

## 2019-05-08 MED ORDER — POTASSIUM CHLORIDE CRYS ER 20 MEQ PO TBCR: 40.0000 meq | EXTENDED_RELEASE_TABLET | ORAL | Status: DC

## 2019-05-08 MED ORDER — SPIRONOLACTONE 12.5 MG HALF TABLET
12.5000 mg | ORAL_TABLET | Freq: Every day | ORAL | Status: DC
  Administered 2019-05-08: 12.5 mg via ORAL
  Filled 2019-05-08 (×3): qty 1

## 2019-05-08 MED ORDER — ISOSORB DINITRATE-HYDRALAZINE 20-37.5 MG PO TABS
1.0000 | ORAL_TABLET | Freq: Three times a day (TID) | ORAL | Status: DC
  Administered 2019-05-08 (×3): 1 via ORAL
  Filled 2019-05-08 (×3): qty 1

## 2019-05-08 MED ORDER — LACTULOSE 10 GM/15ML PO SOLN
20.0000 g | Freq: Two times a day (BID) | ORAL | Status: DC
  Administered 2019-05-08 – 2019-05-26 (×35): 20 g via ORAL
  Filled 2019-05-08 (×36): qty 30

## 2019-05-08 NOTE — Plan of Care (Signed)
  Problem: Health Behavior/Discharge Planning: Goal: Ability to manage health-related needs will improve Outcome: Progressing   Problem: Clinical Measurements: Goal: Ability to maintain clinical measurements within normal limits will improve Outcome: Progressing Goal: Respiratory complications will improve Outcome: Progressing Goal: Cardiovascular complication will be avoided Outcome: Progressing   

## 2019-05-08 NOTE — Progress Notes (Signed)
Patient ID: Austin Cobb, male   DOB: 04-Oct-1960, 58 y.o.   MRN: [id number]     Advanced Heart Failure Rounding Note  PCP-Cardiologist: Donato Heinz, MD   Subjective:    Doing better this morning.  Remains confused but not agitated.  BP high, remains in NSR.  Co-ox 67% today, CVP 9. He is on milrinone 0.25 mcg/kg/min.  Creatinine down to 2.18.   E coli UTI on ceftriaxone.    Objective:   Weight Range: 62.5 kg Body mass index is 20.35 kg/m.   Vital Signs:   Temp:  [97.5 F (36.4 C)-98.1 F (36.7 C)] 97.9 F (36.6 C) (02/27 0327) Pulse Rate:  [80-85] 85 (02/27 0327) Resp:  [15-16] 16 (02/27 0327) BP: (131-164)/(79-112) 161/79 (02/27 0327) SpO2:  [98 %-100 %] 100 % (02/27 0327) Weight:  [62.5 kg] 62.5 kg (02/27 0327) Last BM Date: 05/07/19  Weight change: Filed Weights   05/06/19 0706 05/07/19 0500 05/08/19 0327  Weight: 61 kg 60.8 kg 62.5 kg    Intake/Output:   Intake/Output Summary (Last 24 hours) at 05/08/2019 0917 Last data filed at 05/07/2019 2211 Gross per 24 hour  Intake 383 ml  Output --  Net 383 ml      Physical Exam    General: Drowsy Neck: JVP 8-9 cm, no thyromegaly or thyroid nodule.  Lungs: Clear to auscultation bilaterally with normal respiratory effort. CV: Lateral PMI.  Heart regular S1/S2, no S3/S4, no murmur.  No peripheral edema.   Abdomen: Soft, nontender, no hepatosplenomegaly, no distention.  Skin: Intact without lesions or rashes.  Neurologic: Confused. Extremities: No clubbing or cyanosis.  HEENT: Normal.    Telemetry   NSR 80s (personally reviewed)  Labs    CBC Recent Labs    05/07/19 0409 05/08/19 0527  WBC 9.1 8.9  HGB 13.2 10.1*  HCT 40.5 29.8*  MCV 96.9 92.8  PLT 165 [id number]   Basic Metabolic Panel Recent Labs    05/07/19 0409 05/07/19 0409 05/07/19 1253 05/07/19 1253 05/07/19 1357 05/08/19 0527  NA 136   < > 139   < > 137 136  K 6.2*   < > 5.0   < > 4.5 3.1*  CL 101   < > 103   < > 103 102   CO2 15*   < > 16*   < > 18* 20*  GLUCOSE 114*   < > 122*   < > 145* 143*  BUN 33*   < > 37*   < > 39* 41*  CREATININE 2.41*   < > 2.55*   < > 2.58* 2.18*  CALCIUM 8.9   < > 8.6*   < > 8.3* 8.0*  MG 2.5*  --   --   --   --  2.0  PHOS 7.3*   < > 7.2*  --   --  4.3   < > = values in this interval not displayed.   Liver Function Tests Recent Labs    05/07/19 0409 05/07/19 0409 05/07/19 1253 05/08/19 0527  AST 170*  --   --   --   ALT 107*  --   --   --   ALKPHOS 108  --   --   --   BILITOT 0.9  --   --   --   PROT 6.7  --   --   --   ALBUMIN 3.5  3.6   < > 3.3* 3.0*   < > = values in  this interval not displayed.   No results for input(s): LIPASE, AMYLASE in the last 72 hours. Cardiac Enzymes Recent Labs    05/05/19 1006  CKTOTAL 116    BNP: BNP (last 3 results) Recent Labs    05/04/19 1540  BNP 2,060.2*    ProBNP (last 3 results) No results for input(s): PROBNP in the last 8760 hours.   D-Dimer No results for input(s): DDIMER in the last 72 hours. Hemoglobin A1C Recent Labs    05/05/19 1006  HGBA1C 5.3   Fasting Lipid Panel Recent Labs    05/05/19 1006  CHOL 76  HDL 33*  LDLCALC 35  TRIG 41  CHOLHDL 2.3   Thyroid Function Tests No results for input(s): TSH, T4TOTAL, T3FREE, THYROIDAB in the last 72 hours.  Invalid input(s): FREET3  Other results:   Imaging    No results found.   Medications:     Scheduled Medications: . amoxicillin  500 mg Oral Q8H  . Chlorhexidine Gluconate Cloth  6 each Topical Daily  . folic acid  1 mg Oral Daily  . isosorbide-hydrALAZINE  1 tablet Oral TID  . lactulose  20 g Oral BID   Or  . lactulose  300 mL Rectal BID  . multivitamin with minerals  1 tablet Oral Daily  . nicotine  21 mg Transdermal Daily  . potassium chloride  40 mEq Oral Q4H  . sodium chloride flush  10-40 mL Intracatheter Q12H  . sodium chloride flush  3 mL Intravenous Q12H  . spironolactone  12.5 mg Oral Daily  . thiamine  100 mg  Oral Daily   Or  . thiamine  100 mg Intravenous Daily    Infusions: . amiodarone 30 mg/hr (05/08/19 0016)  . heparin 800 Units/hr (05/08/19 0011)  . milrinone 0.25 mcg/kg/min (05/08/19 0822)    PRN Medications: metoprolol tartrate, sodium chloride flush   Assessment/Plan   1. Altered mental status/delirium: Suspect ETOH withdrawal with significant rise in BP and history of heavy ETOH.  Also possible hepatic encephalopathy with elevated NH3. Less agitated this morning, able to answer some questions.  - On CIWA protocol, has been getting Ativan.  - On lactulose.  2.  AKI: Suspect cardiorenal, possibly with low output. Contrast nephropathy from PE CT may also play a role.  Creatinine improving on milrinone, down to 2.1 today. 3. Acute systolic CHF: Echo this admission with EF 15%, severe RV dysfunction.  No prior cardiac history.  Etiology is uncertain => could be tachycardia-mediated CMP but exertional dyspnea pre-dated palpitations, could be due to long-standing heavy ETOH use, could be viral cardiomyopathy, cannot rule out CAD. He has now had DCCV and is in NSR.  Co-ox markedly low, milrinone started and co-ox up to 67% today.  Creatinine improving.  CVP 9.  - Continue milrinone 0.25 mcg/kg/min today.   - Hold Lasix for now with AKI and CVP only 9.   - Hold Toprol XL for now with low output. - Increase Bidil to 1 tab tid.  - Add spironolactone 12.5 daily.  - Decrease K replacement to 2 doses today, careful with elevated creatinine.   - Eventual right/left heart cath once delirium and renal failure improved.  4. Atrial flutter: AFL with RVR at admission, uncertain how long.  He had felt palpitations for about a week.  Possible component of tachy-mediated CMP.  He is s/p TEE-guided DCCV and is in NSR.  - Continue amiodarone gtt for now while on milrinone.   - Consider ablation down  the road, especially if he can stop drinking.  - Hold Eliquis for now and cover with heparin gtt.   5.  ETOH abuse: Strongly encouraged him to quit.  Rehab program would be helpful. Now with withdrawal as above.   Length of Stay: [place], MD  05/08/2019, 9:17 AM  Advanced Heart Failure Team Pager [phone] (M-F; 7a - 4p)  Please contact [facility] for night-coverage after hours (4p -7a ) and weekends on [email or web address]

## 2019-05-08 NOTE — Progress Notes (Addendum)
[place] for Apixaban>>Heparin Indication: atrial fibrillation  No Known Allergies  Patient Measurements: Height: 5\' 9"  (175.3 cm) Weight: 137 lb 12.6 oz (62.5 kg) IBW/kg (Calculated) : 70.7   Vital Signs: Temp: 97.9 F (36.6 C) (02/27 0327) Temp Source: Oral (02/27 0327) BP: 161/79 (02/27 0327) Pulse Rate: 85 (02/27 0327)  Labs: Recent Labs    05/05/19 1006 05/06/19 0549 05/06/19 0549 05/07/19 0409 05/07/19 0409 05/07/19 1253 05/07/19 1357 05/08/19 0040 05/08/19 0527  HGB  --  11.3*   < > 13.2  --   --   --   --  10.1*  HCT  --  33.1*  --  40.5  --   --   --   --  29.8*  PLT  --  156  --  165  --   --   --   --  115*  APTT  --   --   --  188*   < >  --  138* 102* 92*  LABPROT  --  18.0*  --   --   --   --   --   --   --   INR  --  1.5*  --   --   --   --   --   --   --   HEPARINUNFRC  --   --   --  >2.20*  --   --   --   --  >2.20*  CREATININE  --  1.52*   < > 2.41*   < > 2.55* 2.58*  --  2.18*  CKTOTAL 116  --   --   --   --   --   --   --   --    < > = values in this interval not displayed.    Estimated Creatinine Clearance: 32.3 mL/min (A) (by C-G formula based on SCr of 2.18 mg/dL (H)).  Assessment: 59 year old M w/ new aFlutter (CHADS2VASc = 0-1 for possible HTN), started on apixaban 2/24 and 2/25 transition back to heparin for cath. Apixaban last dose 2/25 9:30. Will follow APTT until correlates with HL. May need TEE/DCCV before discharge.  HL high due to apixaban. APTT at goal. H/H and plt dropped, no bleeding per RN, no issues with infusion. Drop may be due to dilution of sample.  Goal of Therapy:  Heparin level 0.3-0.7 units/mL APTT 66-102 secs Monitor platelets by anticoagulation protocol: Yes   Plan:  Cont heparin at 800 units/hr Monitor daily aPTT, HL, CBC, plt Monitor for signs/symptoms of bleeding   Benetta Spar, PharmD, BCPS, BCCP Clinical Pharmacist  Please check AMION for all [facility] phone  numbers After 10:00 PM, call [place] [phone]

## 2019-05-08 NOTE — Progress Notes (Signed)
Confirmed pt is in 2:1 aflutter with rate of 120. Dr Sonia Side paged via [place]. Currently on amio and heparin gtt.

## 2019-05-08 NOTE — Progress Notes (Signed)
PROGRESS NOTE  NATHNAEL SHAWCROFT [id number] DOB: 09/17/1960   PCP: Patient, No Pcp Per  Patient is from: Home.  Lives with a friend.  Ambulates independently at baseline.  DOA: 05/04/2019 LOS: 4  Brief Narrative / Interim history: 59 year old male with history of tobacco, EtOH and polysubstance abuse, bipolar disorder, anxiety and depression presenting with progressive dyspnea for 1 day and exertional dyspnea and fatigue for over 2 months.  Also had paroxysmal nocturnal dyspnea.   In ED, found to be in A.  Flutter with RVR with HR in the 130s.  Also hypertensive with SBP in 170s and DBP in 130s.  UDS negative.  Mildly elevated AST and ALT.BNP 2000.  High-sensitivity troponin 48> 50.  EKG revealed a flutter and LVH.  Lactic acid 2.1> 1.7.  Hgb 11.7.  UA with large LE and many bacteria.  POC COVID-19 test negative.  Cardiology consulted.  Patient admitted for atrial flutter with RVR and unknown type acute CHF.   D-dimer elevated.  CTA chest negative for PE.  Echo with EF of 10 to 15%, global hypokinesis, unknown diastolic function, severely reduced RVEF and severely enlarged RV with RVSP of 28.2 mmHg.   Patient underwent TEE guided cardioversion on 05/06/2019 and converted to NSR. TEE with EF of 10%, moderate MR, no thrombus or significant ASD or PFO.   Advanced heart failure team involved.  Started high-dose IV Lasix but renal function gotten worse.  Subjective: No major events overnight or this morning.  More awake but confused.  Continues to feel weak.  Responds no to pain.  CIWA score ranged from 1-3.  Does not appear to be in distress.  I&O incomplete.  Renal function improved.  Objective: Vitals:   05/07/19 1940 05/08/19 0327 05/08/19 0800 05/08/19 1141  BP: (!) 164/89 (!) 161/79  (!) 175/98  Pulse: 80 85 81 (!) 120  Resp: 15 16    Temp: 98.1 F (36.7 C) 97.9 F (36.6 C)  97.9 F (36.6 C)  TempSrc: Oral Oral  Axillary  SpO2: 100% 100% 100%   Weight:  62.5 kg    Height:         Intake/Output Summary (Last 24 hours) at 05/08/2019 1512 Last data filed at 05/08/2019 1141 Gross per 24 hour  Intake 383 ml  Output 500 ml  Net -117 ml   Filed Weights   05/06/19 0706 05/07/19 0500 05/08/19 0327  Weight: 61 kg 60.8 kg 62.5 kg    Examination:  GENERAL: Chronically ill-appearing.  No apparent distress. HEENT: MMM.  Vision and hearing grossly intact.  NECK: Supple.  Prominent JVD. RESP: On RA.  No IWOB. Good air movement bilaterally. CVS:  RRR. Heart sounds normal.  ABD/GI/GU: Bowel sounds present. Soft. Non tender.  MSK/EXT:  Moves extremities. No apparent deformity. No edema.  SKIN: no apparent skin lesion or wound NEURO: Awake but confused.  No apparent focal neuro deficit. PSYCH: Confused.  No distress or agitation.  GENERAL: Chronically ill-appearing.  No apparent distress. HEENT: MMM.  Vision and hearing grossly intact.  NECK: Supple.  Prominent JVD. RESP: On RA.  No IWOB.  Fair aeration bilaterally. CVS:  RRR. Heart sounds normal.  ABD/GI/GU: Bowel sounds present. Soft. Non tender.  MSK/EXT:  Moves extremities. No apparent deformity. No edema.  SKIN: no apparent skin lesion or wound NEURO: Awake but confused.  No facial asymmetry.  Moves all extremities.  PSYCH: Calm.  No distress or agitation.  Procealdures:  2/24-TTE with EF of 10 to 15%, global  hypokinesis, unknown diastolic function, severely reduced RVEF and severely enlarged RV with RVSP of 28.2 mmHg.   2/25-TEE with EF of 10%, moderate MR, no thrombus or significant ASD or PFO.  Successful cardioversion with conversion to NSR in 60's but now HR in 110.  Assessment & Plan: Acute combined CHF wheeze severely reduced RVEF: TTE ENT as above.  presented with cardinal symptoms.  BNP 2000.  LVH on EKG.  CXR with cardiomegaly and small right pleural effusion.  Has prominent JVD on exam.  CTA negative for PE but bilateral pleural effusion.  Intake and output incomplete. Cr 1.19 (admit)> 1.42> 1.52>  2.58> 2.18 -Advanced HF team managing-diuretics on hold due to renal function. -On milrinone gtt, BiDil and Aldactone -Monitor fluid status, renal function and lytes  Atrial flutter with RVR: RVR resolved.  CHA2DS2-VASc score of 2.  TTE and TEE as above.  -2/25-successful TEE/DCCV on 2/25. Converted to NSR -Discontinue amiodarone?  Acute metabolic encephalopathy: Likely a combination of alcohol withdrawal, Ativan and hepatic encephalopathy.  Improved.  -Continue lactulose -Continue CIWA with as needed Ativan -Fall, aspiration and delirium precautions  Acute kidney injury/azotemia: Baseline Cr ~1.1>>1.19 (admit)> 1.42> 1.52> 2.58>2.18. Likely a combination of CHF/atrial flutter, diuretics and possibly contrast from CTA -Diuretics on hold. -Continue monitoring  Elevated d-dimer: CTA negative for PE but moderate bilateral pleural effusions, right > left.   Elevated troponin: Likely demand ischemia from a flutter and CHF.  TTE and TEE as above. -Cardiology following  Elevated liver enzymes: Likely due to alcohol, CHF and possibly due to amiodarone.  CK within normal.  Acute hepatitis panel and HIV negative. -Manage CHF and EtOH withdrawal as above as above  Hypokalemia hypomagnesemia/hypomagnesemia:  -Replenish and recheck.  E. coli UTI: Patient have symptoms although chronic.  Urine culture with pansensitive E. coli. -CTX 2/23-2/27.  Amoxicillin 2/27>  Essential hypertension: Elevated blood pressures this morning  -Cardiology increased BiDil  History of polysubstance use -Cessation counseling when able -Consult CSW for resources  Tobacco use disorder: Smokes about 1 pack a day. -Encourage cessation -Nicotine patch  Alcohol use disorder: Reports history of DTs. -Continue CIWA, as needed Ativan and vitamins  Generalized weakness/debility -PT/OT eval  Euthyroid sick syndrome: TSH 5.4.  Free T4 1.68 -Repeat thyroid panel in 4 to 6 weeks outpatient.   Anion gap metabolic  acidosis: Due to AKI/azotemia?  Improved. -Continue monitoring.             DVT prophylaxis: On Eliquis for anticoagulation Code Status: Full code Family Communication: Patient and/or RN. Available if any question.  Discharge barrier: Acute decompensated CHF and encephalopathy Patient is from: Home.  Lives with friend. Final disposition: To be determined based on clinical progress  Consultants: Cardiology   Microbiology summarized: [id number] negative.  Sch Meds:  Scheduled Meds: . amoxicillin  500 mg Oral Q8H  . Chlorhexidine Gluconate Cloth  6 each Topical Daily  . folic acid  1 mg Oral Daily  . isosorbide-hydrALAZINE  1 tablet Oral TID  . lactulose  20 g Oral BID   Or  . lactulose  300 mL Rectal BID  . multivitamin with minerals  1 tablet Oral Daily  . nicotine  21 mg Transdermal Daily  . potassium chloride  40 mEq Oral Q4H  . sodium chloride flush  10-40 mL Intracatheter Q12H  . sodium chloride flush  3 mL Intravenous Q12H  . spironolactone  12.5 mg Oral Daily  . thiamine  100 mg Oral Daily   Or  .  thiamine  100 mg Intravenous Daily   Continuous Infusions: . amiodarone 30 mg/hr (05/08/19 1138)  . heparin 800 Units/hr (05/08/19 0011)  . milrinone 0.25 mcg/kg/min (05/08/19 0822)   PRN Meds:.metoprolol tartrate, sodium chloride flush  Antimicrobials: Anti-infectives (From admission, onward)   Start     Dose/Rate Route Frequency Ordered Stop   05/08/19 1400  amoxicillin (AMOXIL) capsule 500 mg     500 mg Oral Every 8 hours 05/08/19 0751 05/09/19 2159   05/05/19 1900  cefTRIAXone (ROCEPHIN) 1 g in sodium chloride 0.9 % 100 mL IVPB  Status:  Discontinued     1 g 200 mL/hr over 30 Minutes Intravenous Every 24 hours 05/04/19 2110 05/08/19 0751   05/04/19 1730  cefTRIAXone (ROCEPHIN) 1 g in sodium chloride 0.9 % 100 mL IVPB     1 g 200 mL/hr over 30 Minutes Intravenous  Once 05/04/19 1724 05/04/19 2005       I have personally reviewed the following labs and  images: CBC: Recent Labs  Lab 05/04/19 1540 05/04/19 1540 05/04/19 2144 05/05/19 0130 05/06/19 0549 05/07/19 0409 05/08/19 0527  WBC 6.5   < > 5.8 6.2 7.3 9.1 8.9  NEUTROABS 4.2  --   --   --   --   --   --   HGB 11.7*   < > 11.8* 12.1* 11.3* 13.2 10.1*  HCT 35.2*   < > 35.3* 37.2* 33.1* 40.5 29.8*  MCV 96.2   < > 93.6 95.6 92.5 96.9 92.8  PLT 152   < > 167 170 156 165 115*   < > = values in this interval not displayed.   BMP &GFR Recent Labs  Lab 05/04/19 2144 05/05/19 0130 05/05/19 1006 05/06/19 0549 05/07/19 0409 05/07/19 1253 05/07/19 1357 05/08/19 0527  NA 139   < >  --  137 136 139 137 136  K 4.1   < >  --  3.7 6.2* 5.0 4.5 3.1*  CL 105   < >  --  102 101 103 103 102  CO2 21*   < >  --  23 15* 16* 18* 20*  GLUCOSE 156*   < >  --  109* 114* 122* 145* 143*  BUN 20   < >  --  25* 33* 37* 39* 41*  CREATININE 1.25*   < >  --  1.52* 2.41* 2.55* 2.58* 2.18*  CALCIUM 8.7*   < >  --  8.6* 8.9 8.6* 8.3* 8.0*  MG 1.6*  --  1.5* 2.1 2.5*  --   --  2.0  PHOS 4.2  --   --  5.4* 7.3* 7.2*  --  4.3   < > = values in this interval not displayed.   Estimated Creatinine Clearance: 32.3 mL/min (A) (by C-G formula based on SCr of 2.18 mg/dL (H)). Liver & Pancreas: Recent Labs  Lab 05/04/19 1540 05/04/19 1540 05/04/19 2144 05/06/19 0549 05/07/19 0409 05/07/19 1253 05/08/19 0527  AST 66*  --  77*  --  170*  --   --   ALT 46*  --  53*  --  107*  --   --   ALKPHOS 73  --  76  --  108  --   --   BILITOT 0.9  --  1.1  --  0.9  --   --   PROT 6.4*  --  6.5  --  6.7  --   --   ALBUMIN 3.4*   < >  3.5 3.4* 3.5  3.6 3.3* 3.0*   < > = values in this interval not displayed.   No results for input(s): LIPASE, AMYLASE in the last 168 hours. Recent Labs  Lab 05/07/19 1206 05/08/19 0527  AMMONIA 60* 28   Diabetic: No results for input(s): HGBA1C in the last 72 hours. Recent Labs  Lab 05/05/19 1810 05/05/19 2326 05/06/19 2041  GLUCAP 103* 122* 111*   Cardiac  Enzymes: Recent Labs  Lab 05/05/19 1006  CKTOTAL 116   No results for input(s): PROBNP in the last 8760 hours. Coagulation Profile: Recent Labs  Lab 05/06/19 0549  INR 1.5*   Thyroid Function Tests: No results for input(s): TSH, T4TOTAL, FREET4, T3FREE, THYROIDAB in the last 72 hours. Lipid Profile: No results for input(s): CHOL, HDL, LDLCALC, TRIG, CHOLHDL, LDLDIRECT in the last 72 hours. Anemia Panel: No results for input(s): VITAMINB12, FOLATE, FERRITIN, TIBC, IRON, RETICCTPCT in the last 72 hours. Urine analysis:    Component Value Date/Time   COLORURINE AMBER (A) 05/04/2019 1600   APPEARANCEUR HAZY (A) 05/04/2019 1600   LABSPEC 1.024 05/04/2019 1600   PHURINE 5.0 05/04/2019 1600   GLUCOSEU NEGATIVE 05/04/2019 1600   HGBUR NEGATIVE 05/04/2019 1600   BILIRUBINUR NEGATIVE 05/04/2019 1600   KETONESUR NEGATIVE 05/04/2019 1600   PROTEINUR 100 (A) 05/04/2019 1600   NITRITE NEGATIVE 05/04/2019 1600   LEUKOCYTESUR LARGE (A) 05/04/2019 1600   Sepsis Labs: Invalid input(s): PROCALCITONIN, [place]  Microbiology: Recent Results (from the past 240 hour(s))  Urine culture     Status: Abnormal   Collection Time: 05/04/19  3:47 PM   Specimen: Urine, Clean Catch  Result Value Ref Range Status   Specimen Description URINE, CLEAN CATCH  Final   Special Requests   Final    NONE Performed at [facility], [place] [address], [place], [place] [address]    Culture >=100,000 COLONIES/mL ESCHERICHIA COLI (A)  Final   Report Status 05/06/2019 FINAL  Final   Organism ID, Bacteria ESCHERICHIA COLI (A)  Final      Susceptibility   Escherichia coli - MIC*    AMPICILLIN 8 SENSITIVE Sensitive     CEFAZOLIN <=4 SENSITIVE Sensitive     CEFTRIAXONE <=0.25 SENSITIVE Sensitive     CIPROFLOXACIN <=0.25 SENSITIVE Sensitive     GENTAMICIN <=1 SENSITIVE Sensitive     IMIPENEM <=0.25 SENSITIVE Sensitive     NITROFURANTOIN <=16 SENSITIVE Sensitive     TRIMETH/SULFA <=20 SENSITIVE Sensitive      AMPICILLIN/SULBACTAM 4 SENSITIVE Sensitive     PIP/TAZO <=4 SENSITIVE Sensitive     * >=100,000 COLONIES/mL ESCHERICHIA COLI  SARS CORONAVIRUS 2 (TAT 6-24 HRS) Nasopharyngeal Nasopharyngeal Swab     Status: None   Collection Time: 05/05/19  8:06 AM   Specimen: Nasopharyngeal Swab  Result Value Ref Range Status   SARS Coronavirus 2 NEGATIVE NEGATIVE Final    Comment: (NOTE) SARS-CoV-2 target nucleic acids are NOT DETECTED. The SARS-CoV-2 RNA is generally detectable in upper and lower respiratory specimens during the acute phase of infection. Negative results do not preclude SARS-CoV-2 infection, do not rule out co-infections with other pathogens, and should not be used as the sole basis for treatment or other patient management decisions. Negative results must be combined with clinical observations, patient history, and epidemiological information. The expected result is Negative. Fact Sheet for Patients: SugarRoll.be Fact Sheet for Healthcare Providers: https://www.woods-mathews.com/ This test is not yet approved or cleared by the [place] FDA and  has been authorized for  detection and/or diagnosis of SARS-CoV-2 by FDA under an Emergency Use Authorization (EUA). This EUA will remain  in effect (meaning this test can be used) for the duration of the COVID-19 declaration under Section 56 4(b)(1) of the Act, 21 U.S.C. section 360bbb-3(b)(1), unless the authorization is terminated or revoked sooner. Performed at [facility], [place] [address] [place], [place] [address]     Radiology Studies: No results found.    Jozalynn Noyce T. [place]  If 7PM-7AM, please contact night-coverage [email or web address] Password [email or web address] 05/08/2019, 3:12 PM

## 2019-05-08 NOTE — Progress Notes (Signed)
Increased HR at 110's - 120's. Getting EKG to rule out aflutter/afib.

## 2019-05-08 NOTE — Progress Notes (Signed)
[place] for Apixaban>>Heparin Indication: atrial fibrillation  No Known Allergies  Patient Measurements: Height: 5\' 9"  (175.3 cm) Weight: 134 lb 0.6 oz (60.8 kg) IBW/kg (Calculated) : 70.7   Vital Signs: Temp: 98.1 F (36.7 C) (02/26 1940) Temp Source: Oral (02/26 1940) BP: 164/89 (02/26 1940) Pulse Rate: 80 (02/26 1940)  Labs: Recent Labs    05/05/19 0711 05/05/19 1006 05/06/19 0549 05/06/19 0549 05/07/19 0409 05/07/19 1253 05/07/19 1357 05/08/19 0040  HGB  --   --  11.3*  --  13.2  --   --   --   HCT  --   --  33.1*  --  40.5  --   --   --   PLT  --   --  156  --  165  --   --   --   APTT  --   --   --   --  188*  --  138* 102*  LABPROT  --   --  18.0*  --   --   --   --   --   INR  --   --  1.5*  --   --   --   --   --   HEPARINUNFRC 0.62  --   --   --  >2.20*  --   --   --   CREATININE  --   --  1.52*   < > 2.41* 2.55* 2.58*  --   CKTOTAL  --  116  --   --   --   --   --   --    < > = values in this interval not displayed.    Estimated Creatinine Clearance: 26.5 mL/min (A) (by C-G formula based on SCr of 2.58 mg/dL (H)).   Medical History: Past Medical History:  Diagnosis Date  . Anxiety   . Arthritis   . Bipolar 1 disorder ([place])   . Depression    Bipolar, not on meds/pt. stopped meds on own over 12 yrs ago  . GERD (gastroesophageal reflux disease)   . History of colon polyps   . Manic disorder ([place])   . Substance abuse ([place])    10yrs ago smoke crack and marjuana  . Tobacco abuse 05/13/2012   Assessment: 59 year old male with afib, started on apixaban 2/24 and now will transition back to heparin in prep for cath tomorrow afternoon.   CBC stable, no bleeding noted. Apixaban taken this am, will start heparin tonight. Aptt and heparin level with am labs.   2/27 AM update:  APTT therapeutic after multiple rate decreases  Goal of Therapy:  Heparin level 0.3-0.7 units/mL APTT 66-102 secs Monitor platelets by  anticoagulation protocol: Yes   Plan:  Cont heparin at 800 units/hr Confirmatory aPTT with AM labs  Narda Bonds, PharmD, [place] Pharmacist Phone: [phone]

## 2019-05-09 LAB — CBC
HCT: 33.1 % — ABNORMAL LOW (ref 39.0–52.0)
Hemoglobin: 11.1 g/dL — ABNORMAL LOW (ref 13.0–17.0)
MCH: 31.2 pg (ref 26.0–34.0)
MCHC: 33.5 g/dL (ref 30.0–36.0)
MCV: 93 fL (ref 80.0–100.0)
Platelets: 112 10*3/uL — ABNORMAL LOW (ref 150–400)
RBC: 3.56 MIL/uL — ABNORMAL LOW (ref 4.22–5.81)
RDW: 14.2 % (ref 11.5–15.5)
WBC: 7.4 10*3/uL (ref 4.0–10.5)
nRBC: 0.4 % — ABNORMAL HIGH (ref 0.0–0.2)

## 2019-05-09 LAB — RENAL FUNCTION PANEL
Albumin: 2.9 g/dL — ABNORMAL LOW (ref 3.5–5.0)
Anion gap: 10 (ref 5–15)
BUN: 18 mg/dL (ref 6–20)
CO2: 22 mmol/L (ref 22–32)
Calcium: 8.3 mg/dL — ABNORMAL LOW (ref 8.9–10.3)
Chloride: 105 mmol/L (ref 98–111)
Creatinine, Ser: 1.07 mg/dL (ref 0.61–1.24)
GFR calc Af Amer: >60 mL/min (ref >60)
GFR calc non Af Amer: >60 mL/min (ref >60)
Glucose, Bld: 143 mg/dL — ABNORMAL HIGH (ref 70–99)
Phosphorus: 2.3 mg/dL — ABNORMAL LOW (ref 2.5–4.6)
Potassium: 3.4 mmol/L — ABNORMAL LOW (ref 3.5–5.1)
Sodium: 137 mmol/L (ref 135–145)

## 2019-05-09 LAB — COOXEMETRY PANEL
Carboxyhemoglobin: 1.4 % (ref 0.5–1.5)
Methemoglobin: 1.1 % (ref 0.0–1.5)
O2 Saturation: 64.7 %
Total hemoglobin: 11.5 g/dL — ABNORMAL LOW (ref 12.0–16.0)

## 2019-05-09 LAB — MAGNESIUM: Magnesium: 1.7 mg/dL (ref 1.7–2.4)

## 2019-05-09 LAB — APTT: aPTT: 75 seconds — ABNORMAL HIGH (ref 24–36)

## 2019-05-09 LAB — HEPARIN LEVEL (UNFRACTIONATED): Heparin Unfractionated: 1.02 IU/mL — ABNORMAL HIGH (ref 0.30–0.70)

## 2019-05-09 MED ORDER — CHLORDIAZEPOXIDE HCL 25 MG PO CAPS
25.0000 mg | ORAL_CAPSULE | Freq: Three times a day (TID) | ORAL | Status: DC
  Administered 2019-05-09 – 2019-05-11 (×4): 25 mg via ORAL
  Filled 2019-05-09 (×4): qty 1

## 2019-05-09 MED ORDER — MAGNESIUM SULFATE 2 GM/50ML IV SOLN
2.0000 g | Freq: Once | INTRAVENOUS | Status: AC
  Administered 2019-05-09: 2 g via INTRAVENOUS
  Filled 2019-05-09: qty 50

## 2019-05-09 MED ORDER — POTASSIUM CHLORIDE CRYS ER 20 MEQ PO TBCR
40.0000 meq | EXTENDED_RELEASE_TABLET | Freq: Once | ORAL | Status: AC
  Administered 2019-05-09: 40 meq via ORAL
  Filled 2019-05-09: qty 2

## 2019-05-09 MED ORDER — DIGOXIN 125 MCG PO TABS
0.1250 mg | ORAL_TABLET | Freq: Every day | ORAL | Status: DC
  Administered 2019-05-09 – 2019-05-26 (×18): 0.125 mg via ORAL
  Filled 2019-05-09 (×18): qty 1

## 2019-05-09 MED ORDER — SPIRONOLACTONE 25 MG PO TABS
25.0000 mg | ORAL_TABLET | Freq: Every day | ORAL | Status: DC
  Administered 2019-05-09 – 2019-05-13 (×5): 25 mg via ORAL
  Filled 2019-05-09 (×5): qty 1

## 2019-05-09 MED ORDER — ISOSORB DINITRATE-HYDRALAZINE 20-37.5 MG PO TABS
2.0000 | ORAL_TABLET | Freq: Three times a day (TID) | ORAL | Status: DC
  Administered 2019-05-09 – 2019-05-13 (×10): 2 via ORAL
  Filled 2019-05-09 (×11): qty 2

## 2019-05-09 NOTE — Progress Notes (Signed)
Patient ID: Austin Cobb, male   DOB: 1961-01-31, 59 y.o.   MRN: [id number]     Advanced Heart Failure Rounding Note  PCP-Cardiologist: Donato Heinz, MD   Subjective:    Delirious/hallucinating last night.  Got multiple Ativan doses and now sleeping.  He has been taking his meds when awake.   He went back into atrial flutter yesterday pm, HR 110s.  BP continues to be high.   Co-ox 65% today with CVP around 9.    Objective:   Weight Range: 60.7 kg Body mass index is 19.76 kg/m.   Vital Signs:   Temp:  [97.4 F (36.3 C)-98.1 F (36.7 C)] 97.4 F (36.3 C) (02/28 0653) Pulse Rate:  [85-122] 121 (02/28 0421) Resp:  [16-18] 18 (02/28 0421) BP: (148-195)/(80-122) 187/88 (02/28 0421) SpO2:  [100 %] 100 % (02/27 2353) Weight:  [60.7 kg] 60.7 kg (02/28 0421) Last BM Date: 05/08/19  Weight change: Filed Weights   05/07/19 0500 05/08/19 0327 05/09/19 0421  Weight: 60.8 kg 62.5 kg 60.7 kg    Intake/Output:   Intake/Output Summary (Last 24 hours) at 05/09/2019 0859 Last data filed at 05/09/2019 0100 Gross per 24 hour  Intake 1616.9 ml  Output 1200 ml  Net 416.9 ml      Physical Exam    General: NAD, sleeping Neck: JVP 8 cm, no thyromegaly or thyroid nodule.  Lungs: Clear to auscultation bilaterally with normal respiratory effort. CV: Latearl PMI.  Heart mildly tachy, regular S1/S2, no S3/S4, no murmur.  No peripheral edema.   Abdomen: Soft, nontender, no hepatosplenomegaly, no distention.  Skin: Intact without lesions or rashes.  Neurologic: Sleeping Extremities: No clubbing or cyanosis.  HEENT: Normal.    Telemetry   Atrial flutter 110s (personally reviewed)  Labs    CBC Recent Labs    05/08/19 0527 05/09/19 0537  WBC 8.9 7.4  HGB 10.1* 11.1*  HCT 29.8* 33.1*  MCV 92.8 93.0  PLT 115* [id number]*   Basic Metabolic Panel Recent Labs    05/08/19 0527 05/09/19 0537  NA 136 137  K 3.1* 3.4*  CL 102 105  CO2 20* 22  GLUCOSE 143* 143*  BUN 41*  18  CREATININE 2.18* 1.07  CALCIUM 8.0* 8.3*  MG 2.0 1.7  PHOS 4.3 2.3*   Liver Function Tests Recent Labs    05/07/19 0409 05/07/19 1253 05/08/19 0527 05/09/19 0537  AST 170*  --   --   --   ALT 107*  --   --   --   ALKPHOS 108  --   --   --   BILITOT 0.9  --   --   --   PROT 6.7  --   --   --   ALBUMIN 3.5  3.6   < > 3.0* 2.9*   < > = values in this interval not displayed.   No results for input(s): LIPASE, AMYLASE in the last 72 hours. Cardiac Enzymes No results for input(s): CKTOTAL, CKMB, CKMBINDEX, TROPONINI in the last 72 hours.  BNP: BNP (last 3 results) Recent Labs    05/04/19 1540  BNP 2,060.2*    ProBNP (last 3 results) No results for input(s): PROBNP in the last 8760 hours.   D-Dimer No results for input(s): DDIMER in the last 72 hours. Hemoglobin A1C No results for input(s): HGBA1C in the last 72 hours. Fasting Lipid Panel No results for input(s): CHOL, HDL, LDLCALC, TRIG, CHOLHDL, LDLDIRECT in the last 72 hours. Thyroid Function Tests  No results for input(s): TSH, T4TOTAL, T3FREE, THYROIDAB in the last 72 hours.  Invalid input(s): FREET3  Other results:   Imaging    No results found.   Medications:     Scheduled Medications: . amoxicillin  500 mg Oral Q8H  . Chlorhexidine Gluconate Cloth  6 each Topical Daily  . digoxin  0.125 mg Oral Daily  . folic acid  1 mg Oral Daily  . isosorbide-hydrALAZINE  2 tablet Oral TID  . lactulose  20 g Oral BID   Or  . lactulose  300 mL Rectal BID  . multivitamin with minerals  1 tablet Oral Daily  . nicotine  21 mg Transdermal Daily  . potassium chloride  40 mEq Oral Once  . sodium chloride flush  10-40 mL Intracatheter Q12H  . sodium chloride flush  3 mL Intravenous Q12H  . spironolactone  25 mg Oral Daily  . thiamine  100 mg Oral Daily   Or  . thiamine  100 mg Intravenous Daily    Infusions: . amiodarone 30 mg/hr (05/08/19 2310)  . heparin 800 Units/hr (05/09/19 0556)  . magnesium  sulfate bolus IVPB    . milrinone 0.25 mcg/kg/min (05/09/19 0549)    PRN Medications: LORazepam **OR** LORazepam, metoprolol tartrate, sodium chloride flush   Assessment/Plan   1. Altered mental status/delirium: Suspect ETOH withdrawal with significant rise in BP and history of heavy ETOH.  Also possible hepatic encephalopathy with elevated NH3. Sleeping today after Ativan overnight.  - On CIWA protocol, has been getting Ativan.  - On lactulose.  2.  AKI: Suspect cardiorenal, possibly with low output. Contrast nephropathy from PE CT may also play a role.  Creatinine improving on milrinone, down to 1.07 today. 3. Acute systolic CHF: Echo this admission with EF 15%, severe RV dysfunction.  No prior cardiac history.  Etiology is uncertain => could be tachycardia-mediated CMP but exertional dyspnea pre-dated palpitations, could be due to long-standing heavy ETOH use, could be viral cardiomyopathy, cannot rule out CAD. He had DCCV but atrial flutter recurred yesterday afternoon and remains in flutter today.  Co-ox markedly low, milrinone started and co-ox up to 65% today.  Creatinine improving, down to 1.07.  CVP 9.  - Can decrease milrinone to 0.125 mcg/kg/min.   - CVP 9 and poor po intake, no Lasix for now.   - Hold Toprol XL for now with low output. - Increase Bidil to 2 tabs tid.  - Increase spironolactone to 25 mg daily  - Add digoxin 0.125 daily.  - Replace K and Mg.  - Eventual right/left heart cath once delirium improved.  4. Atrial flutter: AFL with RVR at admission, uncertain how long.  He had felt palpitations for about a week.  Possible component of tachy-mediated CMP.  He is s/p TEE-guided DCCV to NSR but atrial flutter recurred on 2/27.  - Increase amiodarone gtt to 60 mg/hr - Decrease milrinone as above.   - Once he has had cath and is off milrinone, will repeat DCCV if he remains in flutter.  - Consider ablation down the road, especially if he can stop drinking.  - Hold Eliquis  for now and cover with heparin gtt.   5. ETOH abuse: Have strongly encouraged him to quit.  Rehab program would be helpful. Now with withdrawal as above.   Length of Stay: [place], [place]  05/09/2019, 8:59 AM  Advanced Heart Failure Team Pager [phone] (M-F; 7a - 4p)  Please contact [facility] for night-coverage  after hours (4p -7a ) and weekends on [email or web address]

## 2019-05-09 NOTE — Progress Notes (Signed)
Attempting oob at foot of bed. Becoming belligerent and combative with NT. Saying he is being held against his will and is asking for the police. Security called to room. Explained to pt that we are treating his irregular heart rate removing fluid from his body to help his breathing. Ativan 4 mg given IV.

## 2019-05-09 NOTE — Progress Notes (Signed)
Patient did not sleep last night; he just fell asleep. He has been restless, anxious, agitated and hallucinating. He pulled out his IV including tubings, and was exiting the bed. Mittens placed to bilateral hands did not help. He received a total of 12 mg PRN  IV Ativan during the night; not effective. Reorientation/redirection was unsuccessful.

## 2019-05-09 NOTE — Progress Notes (Signed)
PROGRESS NOTE  Austin Cobb [id number] DOB: 1960/07/19   PCP: Patient, No Pcp Per  Patient is from: Home.  Lives with a friend.  Ambulates independently at baseline.  DOA: 05/04/2019 LOS: 5  Brief Narrative / Interim history: 59 year old male with history of tobacco, EtOH and polysubstance abuse, bipolar disorder, anxiety and depression presenting with progressive dyspnea for 1 day and exertional dyspnea and fatigue for over 2 months.  Also had paroxysmal nocturnal dyspnea.   In ED, found to be in A.  Flutter with RVR with HR in the 130s.  Also hypertensive with SBP in 170s and DBP in 130s.  UDS negative.  Mildly elevated AST and ALT.BNP 2000.  High-sensitivity troponin 48> 50.  EKG revealed a flutter and LVH.  Lactic acid 2.1> 1.7.  Hgb 11.7.  UA with large LE and many bacteria.  POC COVID-19 test negative.  Cardiology consulted.  Patient admitted for atrial flutter with RVR and unknown type acute CHF.   D-dimer elevated.  CTA chest negative for PE.  Echo with EF of 10 to 15%, global hypokinesis, unknown diastolic function, severely reduced RVEF and severely enlarged RV with RVSP of 28.2 mmHg.   Patient underwent TEE guided cardioversion on 05/06/2019 and converted to NSR. TEE with EF of 10%, moderate MR, no thrombus or significant ASD or PFO.   Advanced heart failure team involved.  Started high-dose IV Lasix but renal function gotten worse.  Assessment & Plan: Acute systolic CHF with severely reduced RVEF: BNP 2000.  LVH on EKG.  CXR with cardiomegaly and small right pleural effusion. CTA negative for PE but bilateral pleural effusion. Cr 1.19 (admit)> 1.42> 1.52> 2.58> 2.18 -Advanced HF team managing-diuretics on hold due to renal function. -On milrinone gtt, BiDil and Aldactone.  -Monitor fluid status, renal function and lytes.  Atrial flutter with RVR: RVR resolved.  CHA2DS2-VASc score of 2.  TTE and TEE as below.  -2/25-successful TEE/DCCV on 2/25. Converted to NSR but then  flipped back into atrial fibrillation on the afternoon of 05/08/2019.  Cardiology managing.  Amiodarone increased.  Digoxin added.  Aldactone increased.  Milrinone decreased.  Acute metabolic encephalopathy/alcohol withdrawal: Reportedly has history of DTs in the past.  Patient has a started having alcohol withdrawal since last evening.  Has received almost 12 mg of Ativan last night and 4 mg this morning.  Patient was too lethargic to hold any conversation this morning when I saw him.  We will start him on Librium 25 mg 3 times daily and continue as needed Ativan with CIWA protocol.    Acute kidney injury/azotemia: Baseline Cr ~1.1>>1.19 (admit)> 1.42> 1.52> 2.58>2.18> 1.07.  Resolved now.  Likely a combination of CHF/atrial flutter, diuretics and possibly contrast from CTA -Diuretics on hold.  -Continue monitoring  Elevated d-dimer: CTA negative for PE but moderate bilateral pleural effusions, right > left.   Elevated troponin: Likely demand ischemia from a flutter and CHF.  TTE and TEE as above. -Cardiology following  Elevated liver enzymes: Likely due to alcohol, CHF and possibly due to amiodarone.  CK within normal.  Acute hepatitis panel and HIV negative. -Manage CHF and EtOH withdrawal as above as above  Hypokalemia hypomagnesemia/hypomagnesemia:  -Replenish and recheck.  E. coli UTI: Patient have symptoms although chronic.  Urine culture with pansensitive E. coli. -CTX 2/23-2/27.  Amoxicillin 2/27>  Essential hypertension: Slightly elevated blood pressure.  Aldactone and amiodarone increased.  History of polysubstance use -Cessation counseling when able -Consult CSW for resources  Tobacco use  disorder: Smokes about 1 pack a day. -Encourage cessation -Nicotine patch  Generalized weakness/debility -PT/OT eval  Euthyroid sick syndrome: TSH 5.4.  Free T4 1.68 -Repeat thyroid panel in 4 to 6 weeks outpatient.   Anion gap metabolic acidosis: Due to AKI/azotemia?   Improved. -Continue monitoring.             DVT prophylaxis: On Eliquis for anticoagulation Code Status: Full code Family Communication: No family present.  Discharge barrier: Acute decompensated CHF and encephalopathy/alcohol withdrawal Patient is from: Home.  Lives with friend. Final disposition: To be determined based on clinical progress and PT assessment once clinically improved  Consultants: Cardiology   Subjective: Patient seen and examined.  He was too lethargic to hold conversation, likely due to receiving several doses of Ativan overnight due to alcohol withdrawal.  Objective: Vitals:   05/09/19 1141 05/09/19 1225 05/09/19 1311 05/09/19 1313  BP: 126/84 (!) 174/80 (!) 193/87   Pulse: (!) 123   (!) 120  Resp:      Temp:    97.9 F (36.6 C)  TempSrc:    Axillary  SpO2:    100%  Weight:      Height:        Intake/Output Summary (Last 24 hours) at 05/09/2019 1354 Last data filed at 05/09/2019 1314 Gross per 24 hour  Intake 1616.9 ml  Output 1400 ml  Net 216.9 ml   Filed Weights   05/07/19 0500 05/08/19 0327 05/09/19 0421  Weight: 60.8 kg 62.5 kg 60.7 kg    Examination:  General exam: Lethargic Respiratory system: Diminished breath sounds due to poor inspiratory effort Cardiovascular system: S1 & S2 heard, RRR. No JVD, murmurs, rubs, gallops or clicks. No pedal edema. Gastrointestinal system: Abdomen is nondistended, soft and nontender. No organomegaly or masses felt. Normal bowel sounds heard. Central nervous system: Lethargic and confused but no focal deficit. Extremities: Symmetric 5 x 5 power. Skin: No rashes, lesions or ulcers.  Psychiatry: Unable to assess due to lethargy.   Procealdures:  2/24-TTE with EF of 10 to 15%, global hypokinesis, unknown diastolic function, severely reduced RVEF and severely enlarged RV with RVSP of 28.2 mmHg.   2/25-TEE with EF of 10%, moderate MR, no thrombus or significant ASD or PFO.  Successful cardioversion  with conversion to NSR in 60's but now HR in 110.    Microbiology summarized: [id number] negative.  Sch Meds:  Scheduled Meds: . amoxicillin  500 mg Oral Q8H  . chlordiazePOXIDE  25 mg Oral TID  . Chlorhexidine Gluconate Cloth  6 each Topical Daily  . digoxin  0.125 mg Oral Daily  . folic acid  1 mg Oral Daily  . isosorbide-hydrALAZINE  2 tablet Oral TID  . lactulose  20 g Oral BID   Or  . lactulose  300 mL Rectal BID  . multivitamin with minerals  1 tablet Oral Daily  . nicotine  21 mg Transdermal Daily  . sodium chloride flush  10-40 mL Intracatheter Q12H  . sodium chloride flush  3 mL Intravenous Q12H  . spironolactone  25 mg Oral Daily  . thiamine  100 mg Oral Daily   Or  . thiamine  100 mg Intravenous Daily   Continuous Infusions: . amiodarone 60 mg/hr (05/09/19 1016)  . heparin 800 Units/hr (05/09/19 0556)  . milrinone 0.125 mcg/kg/min (05/09/19 0918)   PRN Meds:.LORazepam **OR** LORazepam, metoprolol tartrate, sodium chloride flush  Antimicrobials: Anti-infectives (From admission, onward)   Start     Dose/Rate Route Frequency Ordered  Stop   05/08/19 1400  amoxicillin (AMOXIL) capsule 500 mg     500 mg Oral Every 8 hours 05/08/19 0751 05/09/19 2159   05/05/19 1900  cefTRIAXone (ROCEPHIN) 1 g in sodium chloride 0.9 % 100 mL IVPB  Status:  Discontinued     1 g 200 mL/hr over 30 Minutes Intravenous Every 24 hours 05/04/19 2110 05/08/19 0751   05/04/19 1730  cefTRIAXone (ROCEPHIN) 1 g in sodium chloride 0.9 % 100 mL IVPB     1 g 200 mL/hr over 30 Minutes Intravenous  Once 05/04/19 1724 05/04/19 2005       I have personally reviewed the following labs and images: CBC: Recent Labs  Lab 05/04/19 1540 05/04/19 2144 05/05/19 0130 05/06/19 0549 05/07/19 0409 05/08/19 0527 05/09/19 0537  WBC 6.5   < > 6.2 7.3 9.1 8.9 7.4  NEUTROABS 4.2  --   --   --   --   --   --   HGB 11.7*   < > 12.1* 11.3* 13.2 10.1* 11.1*  HCT 35.2*   < > 37.2* 33.1* 40.5 29.8* 33.1*  MCV  96.2   < > 95.6 92.5 96.9 92.8 93.0  PLT 152   < > 170 156 165 115* 112*   < > = values in this interval not displayed.   BMP &GFR Recent Labs  Lab 05/05/19 0130 05/05/19 1006 05/06/19 0549 05/06/19 0549 05/07/19 0409 05/07/19 1253 05/07/19 1357 05/08/19 0527 05/09/19 0537  NA   < >  --  137   < > 136 139 137 136 137  K   < >  --  3.7   < > 6.2* 5.0 4.5 3.1* 3.4*  CL   < >  --  102   < > 101 103 103 102 105  CO2   < >  --  23   < > 15* 16* 18* 20* 22  GLUCOSE   < >  --  109*   < > 114* 122* 145* 143* 143*  BUN   < >  --  25*   < > 33* 37* 39* 41* 18  CREATININE   < >  --  1.52*   < > 2.41* 2.55* 2.58* 2.18* 1.07  CALCIUM   < >  --  8.6*   < > 8.9 8.6* 8.3* 8.0* 8.3*  MG  --  1.5* 2.1  --  2.5*  --   --  2.0 1.7  PHOS   < >  --  5.4*  --  7.3* 7.2*  --  4.3 2.3*   < > = values in this interval not displayed.   Estimated Creatinine Clearance: 63.8 mL/min (by C-G formula based on SCr of 1.07 mg/dL). Liver & Pancreas: Recent Labs  Lab 05/04/19 1540 05/04/19 1540 05/04/19 2144 05/04/19 2144 05/06/19 0549 05/07/19 0409 05/07/19 1253 05/08/19 0527 05/09/19 0537  AST 66*  --  77*  --   --  170*  --   --   --   ALT 46*  --  53*  --   --  107*  --   --   --   ALKPHOS 73  --  76  --   --  108  --   --   --   BILITOT 0.9  --  1.1  --   --  0.9  --   --   --   PROT 6.4*  --  6.5  --   --  6.7  --   --   --   ALBUMIN 3.4*   < > 3.5   < > 3.4* 3.5  3.6 3.3* 3.0* 2.9*   < > = values in this interval not displayed.   No results for input(s): LIPASE, AMYLASE in the last 168 hours. Recent Labs  Lab 05/07/19 1206 05/08/19 0527  AMMONIA 60* 28   Diabetic: No results for input(s): HGBA1C in the last 72 hours. Recent Labs  Lab 05/05/19 1810 05/05/19 2326 05/06/19 2041  GLUCAP 103* 122* 111*   Cardiac Enzymes: Recent Labs  Lab 05/05/19 1006  CKTOTAL 116   No results for input(s): PROBNP in the last 8760 hours. Coagulation Profile: Recent Labs  Lab 05/06/19 0549   INR 1.5*   Thyroid Function Tests: No results for input(s): TSH, T4TOTAL, FREET4, T3FREE, THYROIDAB in the last 72 hours. Lipid Profile: No results for input(s): CHOL, HDL, LDLCALC, TRIG, CHOLHDL, LDLDIRECT in the last 72 hours. Anemia Panel: No results for input(s): VITAMINB12, FOLATE, FERRITIN, TIBC, IRON, RETICCTPCT in the last 72 hours. Urine analysis:    Component Value Date/Time   COLORURINE AMBER (A) 05/04/2019 1600   APPEARANCEUR HAZY (A) 05/04/2019 1600   LABSPEC 1.024 05/04/2019 1600   PHURINE 5.0 05/04/2019 1600   GLUCOSEU NEGATIVE 05/04/2019 1600   HGBUR NEGATIVE 05/04/2019 1600   BILIRUBINUR NEGATIVE 05/04/2019 1600   KETONESUR NEGATIVE 05/04/2019 1600   PROTEINUR 100 (A) 05/04/2019 1600   NITRITE NEGATIVE 05/04/2019 1600   LEUKOCYTESUR LARGE (A) 05/04/2019 1600   Sepsis Labs: Invalid input(s): PROCALCITONIN, [place]  Microbiology: Recent Results (from the past 240 hour(s))  Urine culture     Status: Abnormal   Collection Time: 05/04/19  3:47 PM   Specimen: Urine, Clean Catch  Result Value Ref Range Status   Specimen Description URINE, CLEAN CATCH  Final   Special Requests   Final    NONE Performed at [facility], [place] [address], [place], [place] [address]    Culture >=100,000 COLONIES/mL ESCHERICHIA COLI (A)  Final   Report Status 05/06/2019 FINAL  Final   Organism ID, Bacteria ESCHERICHIA COLI (A)  Final      Susceptibility   Escherichia coli - MIC*    AMPICILLIN 8 SENSITIVE Sensitive     CEFAZOLIN <=4 SENSITIVE Sensitive     CEFTRIAXONE <=0.25 SENSITIVE Sensitive     CIPROFLOXACIN <=0.25 SENSITIVE Sensitive     GENTAMICIN <=1 SENSITIVE Sensitive     IMIPENEM <=0.25 SENSITIVE Sensitive     NITROFURANTOIN <=16 SENSITIVE Sensitive     TRIMETH/SULFA <=20 SENSITIVE Sensitive     AMPICILLIN/SULBACTAM 4 SENSITIVE Sensitive     PIP/TAZO <=4 SENSITIVE Sensitive     * >=100,000 COLONIES/mL ESCHERICHIA COLI  SARS CORONAVIRUS 2 (TAT 6-24 HRS)  Nasopharyngeal Nasopharyngeal Swab     Status: None   Collection Time: 05/05/19  8:06 AM   Specimen: Nasopharyngeal Swab  Result Value Ref Range Status   SARS Coronavirus 2 NEGATIVE NEGATIVE Final    Comment: (NOTE) SARS-CoV-2 target nucleic acids are NOT DETECTED. The SARS-CoV-2 RNA is generally detectable in upper and lower respiratory specimens during the acute phase of infection. Negative results do not preclude SARS-CoV-2 infection, do not rule out co-infections with other pathogens, and should not be used as the sole basis for treatment or other patient management decisions. Negative results must be combined with clinical observations, patient history, and epidemiological information. The expected result is Negative. Fact Sheet for Patients: SugarRoll.be Fact Sheet for  Healthcare Providers: https://www.woods-mathews.com/ This test is not yet approved or cleared by the [place] and  has been authorized for detection and/or diagnosis of SARS-CoV-2 by FDA under an Emergency Use Authorization (EUA). This EUA will remain  in effect (meaning this test can be used) for the duration of the COVID-19 declaration under Section 56 4(b)(1) of the Act, 21 U.S.C. section 360bbb-3(b)(1), unless the authorization is terminated or revoked sooner. Performed at [facility], [place] [address], [place], [place] [address]     Radiology Studies: No results found.  Darliss Cheney, MD [facility]  If 7PM-7AM, please contact night-coverage [email or web address] 05/09/2019, 1:54 PM

## 2019-05-09 NOTE — Progress Notes (Signed)
[facility] for Apixaban>>Heparin Indication: atrial fibrillation  No Known Allergies  Patient Measurements: Height: 5\' 9"  (175.3 cm) Weight: 133 lb 13.1 oz (60.7 kg) IBW/kg (Calculated) : 70.7   Vital Signs: Temp: 97.4 F (36.3 C) (02/28 0653) Temp Source: Axillary (02/28 0653) BP: 187/88 (02/28 0421) Pulse Rate: 121 (02/28 0421)  Labs: Recent Labs    05/07/19 0409 05/07/19 0409 05/07/19 1253 05/07/19 1357 05/07/19 1357 05/08/19 0040 05/08/19 0527 05/09/19 0537  HGB 13.2   < >  --   --   --   --  10.1* 11.1*  HCT 40.5  --   --   --   --   --  29.8* 33.1*  PLT 165  --   --   --   --   --  115* 112*  APTT 188*  --   --  138*   < > 102* 92* 75*  HEPARINUNFRC >2.20*  --   --   --   --   --  >2.20* 1.02*  CREATININE 2.41*  --    < > 2.58*  --   --  2.18* 1.07   < > = values in this interval not displayed.    Estimated Creatinine Clearance: 63.8 mL/min (by C-G formula based on SCr of 1.07 mg/dL).  Assessment: 59 year old M w/ new aFlutter (CHADS2VASc = 0-1 for possible HTN), started on apixaban 2/24 and 2/25 transition back to heparin for cath. Apixaban last dose 2/25 9:30. Will follow APTT until correlates with HL. 2/25 TEE/DCCV converted patient to NSR. 2/27 back in aflutter briefly then sinus tachycardia.   HL still high due to apixaban, downtrending. APTT at goal. H/H up without transfusion, and plt 160s>110s, watch.  Goal of Therapy:  Heparin level 0.3-0.7 units/mL APTT 66-102 secs Monitor platelets by anticoagulation protocol: Yes   Plan:  Cont heparin at 800 units/hr Follow APTT until correlates with HL Monitor daily aPTT, HL, CBC, plt Monitor for signs/symptoms of bleeding   Benetta Spar, PharmD, BCPS, BCCP Clinical Pharmacist  Please check AMION for all [place] phone numbers After 10:00 PM, call [place]

## 2019-05-10 ENCOUNTER — Encounter (HOSPITAL_COMMUNITY)
Admission: EM | Disposition: A | Discharge status: Skilled Nursing Facility | Payer: Self-pay | Source: Home / Self Care | Attending: Internal Medicine

## 2019-05-10 LAB — HEPARIN LEVEL (UNFRACTIONATED): Heparin Unfractionated: 0.49 IU/mL (ref 0.30–0.70)

## 2019-05-10 LAB — RENAL FUNCTION PANEL
Albumin: 2.8 g/dL — ABNORMAL LOW (ref 3.5–5.0)
Anion gap: 12 (ref 5–15)
BUN: 9 mg/dL (ref 6–20)
CO2: 22 mmol/L (ref 22–32)
Calcium: 8.3 mg/dL — ABNORMAL LOW (ref 8.9–10.3)
Chloride: 101 mmol/L (ref 98–111)
Creatinine, Ser: 0.84 mg/dL (ref 0.61–1.24)
GFR calc Af Amer: >60 mL/min (ref >60)
GFR calc non Af Amer: >60 mL/min (ref >60)
Glucose, Bld: 209 mg/dL — ABNORMAL HIGH (ref 70–99)
Phosphorus: 2.7 mg/dL (ref 2.5–4.6)
Potassium: 3.4 mmol/L — ABNORMAL LOW (ref 3.5–5.1)
Sodium: 135 mmol/L (ref 135–145)

## 2019-05-10 LAB — CBC
HCT: 35.3 % — ABNORMAL LOW (ref 39.0–52.0)
Hemoglobin: 11.9 g/dL — ABNORMAL LOW (ref 13.0–17.0)
MCH: 31 pg (ref 26.0–34.0)
MCHC: 33.7 g/dL (ref 30.0–36.0)
MCV: 91.9 fL (ref 80.0–100.0)
Platelets: 108 10*3/uL — ABNORMAL LOW (ref 150–400)
RBC: 3.84 MIL/uL — ABNORMAL LOW (ref 4.22–5.81)
RDW: 14.3 % (ref 11.5–15.5)
WBC: 6.3 10*3/uL (ref 4.0–10.5)
nRBC: 0.6 % — ABNORMAL HIGH (ref 0.0–0.2)

## 2019-05-10 LAB — COOXEMETRY PANEL
Carboxyhemoglobin: 1.6 % — ABNORMAL HIGH (ref 0.5–1.5)
Methemoglobin: 1.1 % (ref 0.0–1.5)
O2 Saturation: 72.6 %
Total hemoglobin: 12.3 g/dL (ref 12.0–16.0)

## 2019-05-10 LAB — APTT: aPTT: 64 seconds — ABNORMAL HIGH (ref 24–36)

## 2019-05-10 LAB — AMMONIA: Ammonia: 29 umol/L (ref 9–35)

## 2019-05-10 LAB — MAGNESIUM: Magnesium: 1.7 mg/dL (ref 1.7–2.4)

## 2019-05-10 LAB — GLUCOSE, CAPILLARY: Glucose-Capillary: 103 mg/dL — ABNORMAL HIGH (ref 70–99)

## 2019-05-10 SURGERY — RIGHT/LEFT HEART CATH AND CORONARY ANGIOGRAPHY
Anesthesia: LOCAL

## 2019-05-10 MED ORDER — MAGNESIUM SULFATE 2 GM/50ML IV SOLN
2.0000 g | Freq: Once | INTRAVENOUS | Status: AC
  Administered 2019-05-10: 2 g via INTRAVENOUS
  Filled 2019-05-10: qty 50

## 2019-05-10 MED ORDER — SACUBITRIL-VALSARTAN 24-26 MG PO TABS
1.0000 | ORAL_TABLET | Freq: Two times a day (BID) | ORAL | Status: DC
  Administered 2019-05-11 – 2019-05-12 (×4): 1 via ORAL
  Filled 2019-05-10 (×5): qty 1

## 2019-05-10 MED ORDER — POTASSIUM CHLORIDE CRYS ER 20 MEQ PO TBCR
40.0000 meq | EXTENDED_RELEASE_TABLET | Freq: Once | ORAL | Status: AC
  Administered 2019-05-10: 40 meq via ORAL
  Filled 2019-05-10: qty 2

## 2019-05-10 NOTE — Plan of Care (Signed)
  Problem: Coping: Goal: Level of anxiety will decrease Outcome: Progressing   Problem: Pain Managment: Goal: General experience of comfort will improve Outcome: Progressing   Problem: Safety: Goal: Ability to remain free from injury will improve Outcome: Progressing   

## 2019-05-10 NOTE — Progress Notes (Signed)
ANTICOAGULATION CONSULT NOTE  Pharmacy Consult for Heparin Indication: atrial fibrillation  No Known Allergies  Patient Measurements: Height: 5\' 9"  (175.3 cm) Weight: 138 lb 3.7 oz (62.7 kg) IBW/kg (Calculated) : 70.7   Vital Signs: Temp: 97 F (36.1 C) (03/01 0403) Temp Source: Axillary (03/01 0403) BP: 166/93 (03/01 0825) Pulse Rate: 114 (03/01 0825)  Labs: Recent Labs    05/08/19 0527 05/08/19 0527 05/09/19 0537 05/10/19 0525  HGB 10.1*   < > 11.1* 11.9*  HCT 29.8*  --  33.1* 35.3*  PLT 115*  --  112* 108*  APTT 92*  --  75* 64*  HEPARINUNFRC >2.20*  --  1.02* 0.49  CREATININE 2.18*  --  1.07 0.84   < > = values in this interval not displayed.    Estimated Creatinine Clearance: 84 mL/min (by C-G formula based on SCr of 0.84 mg/dL).  Assessment: 59 year old M w/ new aFlutter (CHADS2VASc = 0-1 for possible HTN), started on apixaban 2/24 and 2/25 transition back to heparin for cath. Apixaban last dose 2/25 9:30. Will follow APTT until correlates with HL. 2/25 TEE/DCCV converted patient to NSR. 2/27 back in aflutter briefly then sinus tachycardia.   Heparin level at goal but aptt below therapeutic range. H/H up without transfusion, and plt 108. No bleeding issues noted.   Goal of Therapy:  Heparin level 0.3-0.7 units/mL APTT 66-102 secs Monitor platelets by anticoagulation protocol: Yes   Plan:  Increase heparin to 900 units/hr Will check APTT and HL in am then likely heparin level alone Monitor for signs/symptoms of bleeding   Erin Hearing PharmD., BCPS Clinical Pharmacist 05/10/2019 1:03 PM  Please check AMION for all [place] phone numbers After 10:00 PM, call [place] [phone]

## 2019-05-10 NOTE — Progress Notes (Addendum)
PROGRESS NOTE  JAYTHAN CRAKER [id number] DOB: [birth year]   PCP: Patient, No Pcp Per  Patient is from: Home.  Lives with a friend.  Ambulates independently at baseline.  DOA: 05/04/2019 LOS: 6  Brief Narrative / Interim history: 59 year old male with history of tobacco, EtOH and polysubstance abuse, bipolar disorder, anxiety and depression presenting with progressive dyspnea for 1 day and exertional dyspnea and fatigue for over 2 months.  Also had paroxysmal nocturnal dyspnea.   In ED, found to be in A.  Flutter with RVR with HR in the 130s.  Also hypertensive with SBP in 170s and DBP in 130s.  UDS negative.  Mildly elevated AST and ALT.BNP 2000.  High-sensitivity troponin 48> 50.  EKG revealed a flutter and LVH.  Lactic acid 2.1> 1.7.  Hgb 11.7.  UA with large LE and many bacteria.  POC COVID-19 test negative.  Cardiology consulted.  Patient admitted for atrial flutter with RVR and unknown type acute CHF.   D-dimer elevated.  CTA chest negative for PE.  Echo with EF of 10 to 15%, global hypokinesis, unknown diastolic function, severely reduced RVEF and severely enlarged RV with RVSP of 28.2 mmHg.   Patient underwent TEE guided cardioversion on 05/06/2019 and converted to NSR. TEE with EF of 10%, moderate MR, no thrombus or significant ASD or PFO.   Advanced heart failure team involved.  Started high-dose IV Lasix but renal function gotten worse.  Assessment & Plan: Acute systolic CHF with severely reduced RVEF: BNP 2000.  LVH on EKG.  CXR with cardiomegaly and small right pleural effusion. CTA negative for PE but bilateral pleural effusion. Cr 1.19 (admit)> 1.42> 1.52> 2.58> 2.18 -Advanced HF team managing-diuretics on hold due to renal function. -On milrinone gtt, BiDil and Aldactone.  Milrinone to be stopped by cardiology today on 05/10/2019.  Started on [place]. -Monitor fluid status, renal function and lytes.  Persistent atrial flutter with RVR: RVR resolved.  CHA2DS2-VASc score of  2.  TTE and TEE as below.  -2/25-successful TEE/DCCV on 2/25. Converted to NSR but then flipped back into atrial fibrillation on the afternoon of 05/08/2019.  Cardiology managing.  Amiodarone increased.  Digoxin added.  Aldactone increased.  Milrinone decreased.  Plan for repeat DCCV cardioversion once his acute encephalopathy is resolved  Acute metabolic encephalopathy/alcohol withdrawal: Reportedly has history of DTs in the past.  Patient has a started having alcohol withdrawal since the evening of 05/08/2019.  Has received almost 16 mg of Ativan in last 24 hours.  Patient was too lethargic to hold any conversation this morning when I saw him.  Continue Librium 25 mg 3 times daily along with CIWA protocol.  Acute kidney injury/azotemia: Baseline Cr ~1.1>>1.19 (admit)> 1.42> 1.52> 2.58>2.18> 1.07> 2.84.  Resolved now.  Likely a combination of CHF/atrial flutter, diuretics and possibly contrast from CTA -Diuretics on hold.  -Continue monitoring  Elevated d-dimer: CTA negative for PE but moderate bilateral pleural effusions, right > left.   Elevated troponin: Likely demand ischemia from a flutter and CHF.  TTE and TEE as above. -Cardiology following  Elevated liver enzymes: Likely due to alcohol, CHF and possibly due to amiodarone.  CK within normal.  Acute hepatitis panel and HIV negative. -Manage CHF and EtOH withdrawal as above as above  Hypokalemia hypomagnesemia/hypomagnesemia:  -3.4 potassium.  Replace.  Magnesium normal.  E. coli UTI: Patient have symptoms although chronic.  Urine culture with pansensitive E. coli. -CTX 2/23-2/27.  Amoxicillin 2/27>  Essential hypertension: Slightly elevated blood pressure.  Aldactone and amiodarone increased.  History of polysubstance use -Cessation counseling when able -Consult CSW for resources  Tobacco use disorder: Smokes about 1 pack a day. -Encourage cessation -Nicotine patch  Generalized weakness/debility -PT/OT eval  Euthyroid sick  syndrome: TSH 5.4.  Free T4 1.68 -Repeat thyroid panel in 4 to 6 weeks outpatient.   Anion gap metabolic acidosis: Due to AKI/azotemia?  Improved. -Continue monitoring.    DVT prophylaxis: On Eliquis for anticoagulation Code Status: Full code Family Communication: No family present.  Discharge barrier: Acute decompensated CHF and encephalopathy/alcohol withdrawal Patient is from: Home.  Lives with friend. Final disposition: To be determined based on clinical progress and PT assessment once clinically improved  Consultants: Cardiology   Subjective: Patient seen and examined.  Once again too lethargic to have any conversation.  Hand mittens.  Objective: Vitals:   05/09/19 2359 05/10/19 0403 05/10/19 0817 05/10/19 0825  BP: (!) 166/96 (!) 172/100  (!) 166/93  Pulse: (!) 116 (!) 114 (!) 114 (!) 114  Resp: 20 20    Temp: (!) 97.2 F (36.2 C) (!) 97 F (36.1 C)    TempSrc: Axillary Axillary    SpO2: 94% 100%  99%  Weight:  62.7 kg    Height:        Intake/Output Summary (Last 24 hours) at 05/10/2019 1321 Last data filed at 05/10/2019 1035 Gross per 24 hour  Intake 173 ml  Output 800 ml  Net -627 ml   Filed Weights   05/08/19 0327 05/09/19 0421 05/10/19 0403  Weight: 62.5 kg 60.7 kg 62.7 kg    Examination:  General exam: Lethargic Respiratory system: Diminished breath sounds due to poor inspiratory effort Cardiovascular system: S1 & S2 heard, RRR. No JVD, murmurs, rubs, gallops or clicks. No pedal edema. Gastrointestinal system: Abdomen is nondistended, soft and nontender. No organomegaly or masses felt. Normal bowel sounds heard. Central nervous system: Alert and oriented. No focal neurological deficits. Extremities: Moving all extremities Skin: No rashes, lesions or ulcers.  Psychiatry: Unable to assess due to lethargy   Procealdures:  2/24-TTE with EF of 10 to 15%, global hypokinesis, unknown diastolic function, severely reduced RVEF and severely enlarged RV with  RVSP of 28.2 mmHg.   2/25-TEE with EF of 10%, moderate MR, no thrombus or significant ASD or PFO.  Successful cardioversion with conversion to NSR in 60's but now HR in 110.    Microbiology summarized: [id number] negative.  Sch Meds:  Scheduled Meds: . chlordiazePOXIDE  25 mg Oral TID  . Chlorhexidine Gluconate Cloth  6 each Topical Daily  . digoxin  0.125 mg Oral Daily  . folic acid  1 mg Oral Daily  . isosorbide-hydrALAZINE  2 tablet Oral TID  . lactulose  20 g Oral BID   Or  . lactulose  300 mL Rectal BID  . multivitamin with minerals  1 tablet Oral Daily  . nicotine  21 mg Transdermal Daily  . sacubitril-valsartan  1 tablet Oral BID  . sodium chloride flush  10-40 mL Intracatheter Q12H  . sodium chloride flush  3 mL Intravenous Q12H  . spironolactone  25 mg Oral Daily  . thiamine  100 mg Oral Daily   Or  . thiamine  100 mg Intravenous Daily   Continuous Infusions: . amiodarone 60 mg/hr (05/10/19 1031)  . heparin 800 Units/hr (05/10/19 1233)   PRN Meds:.LORazepam **OR** LORazepam, metoprolol tartrate, sodium chloride flush  Antimicrobials: Anti-infectives (From admission, onward)   Start     Dose/Rate Route  Frequency Ordered Stop   05/08/19 1400  amoxicillin (AMOXIL) capsule 500 mg     500 mg Oral Every 8 hours 05/08/19 0751 05/09/19 2159   05/05/19 1900  cefTRIAXone (ROCEPHIN) 1 g in sodium chloride 0.9 % 100 mL IVPB  Status:  Discontinued     1 g 200 mL/hr over 30 Minutes Intravenous Every 24 hours 05/04/19 2110 05/08/19 0751   05/04/19 1730  cefTRIAXone (ROCEPHIN) 1 g in sodium chloride 0.9 % 100 mL IVPB     1 g 200 mL/hr over 30 Minutes Intravenous  Once 05/04/19 1724 05/04/19 2005       I have personally reviewed the following labs and images: CBC: Recent Labs  Lab 05/04/19 1540 05/04/19 2144 05/06/19 0549 05/07/19 0409 05/08/19 0527 05/09/19 0537 05/10/19 0525  WBC 6.5   < > 7.3 9.1 8.9 7.4 6.3  NEUTROABS 4.2  --   --   --   --   --   --   HGB  11.7*   < > 11.3* 13.2 10.1* 11.1* 11.9*  HCT 35.2*   < > 33.1* 40.5 29.8* 33.1* 35.3*  MCV 96.2   < > 92.5 96.9 92.8 93.0 91.9  PLT 152   < > 156 165 115* 112* 108*   < > = values in this interval not displayed.   BMP &GFR Recent Labs  Lab 05/06/19 0549 05/06/19 0549 05/07/19 0409 05/07/19 0409 05/07/19 1253 05/07/19 1357 05/08/19 0527 05/09/19 0537 05/10/19 0525  NA 137   < > 136   < > 139 137 136 137 135  K 3.7   < > 6.2*   < > 5.0 4.5 3.1* 3.4* 3.4*  CL 102   < > 101   < > 103 103 102 105 101  CO2 23   < > 15*   < > 16* 18* 20* 22 22  GLUCOSE 109*   < > 114*   < > 122* 145* 143* 143* 209*  BUN 25*   < > 33*   < > 37* 39* 41* 18 9  CREATININE 1.52*   < > 2.41*   < > 2.55* 2.58* 2.18* 1.07 0.84  CALCIUM 8.6*   < > 8.9   < > 8.6* 8.3* 8.0* 8.3* 8.3*  MG 2.1  --  2.5*  --   --   --  2.0 1.7 1.7  PHOS 5.4*   < > 7.3*  --  7.2*  --  4.3 2.3* 2.7   < > = values in this interval not displayed.   Estimated Creatinine Clearance: 84 mL/min (by C-G formula based on SCr of 0.84 mg/dL). Liver & Pancreas: Recent Labs  Lab 05/04/19 1540 05/04/19 1540 05/04/19 2144 05/06/19 0549 05/07/19 0409 05/07/19 1253 05/08/19 0527 05/09/19 0537 05/10/19 0525  AST 66*  --  77*  --  170*  --   --   --   --   ALT 46*  --  53*  --  107*  --   --   --   --   ALKPHOS 73  --  76  --  108  --   --   --   --   BILITOT 0.9  --  1.1  --  0.9  --   --   --   --   PROT 6.4*  --  6.5  --  6.7  --   --   --   --   ALBUMIN 3.4*   < >  3.5   < > 3.5  3.6 3.3* 3.0* 2.9* 2.8*   < > = values in this interval not displayed.   No results for input(s): LIPASE, AMYLASE in the last 168 hours. Recent Labs  Lab 05/07/19 1206 05/08/19 0527  AMMONIA 60* 28   Diabetic: No results for input(s): HGBA1C in the last 72 hours. Recent Labs  Lab 05/05/19 1810 05/05/19 2326 05/06/19 2041  GLUCAP 103* 122* 111*   Cardiac Enzymes: Recent Labs  Lab 05/05/19 1006  CKTOTAL 116   No results for input(s): PROBNP  in the last 8760 hours. Coagulation Profile: Recent Labs  Lab 05/06/19 0549  INR 1.5*   Thyroid Function Tests: No results for input(s): TSH, T4TOTAL, FREET4, T3FREE, THYROIDAB in the last 72 hours. Lipid Profile: No results for input(s): CHOL, HDL, LDLCALC, TRIG, CHOLHDL, LDLDIRECT in the last 72 hours. Anemia Panel: No results for input(s): VITAMINB12, FOLATE, FERRITIN, TIBC, IRON, RETICCTPCT in the last 72 hours. Urine analysis:    Component Value Date/Time   COLORURINE AMBER (A) 05/04/2019 1600   APPEARANCEUR HAZY (A) 05/04/2019 1600   LABSPEC 1.024 05/04/2019 1600   PHURINE 5.0 05/04/2019 1600   GLUCOSEU NEGATIVE 05/04/2019 1600   HGBUR NEGATIVE 05/04/2019 1600   BILIRUBINUR NEGATIVE 05/04/2019 1600   KETONESUR NEGATIVE 05/04/2019 1600   PROTEINUR 100 (A) 05/04/2019 1600   NITRITE NEGATIVE 05/04/2019 1600   LEUKOCYTESUR LARGE (A) 05/04/2019 1600   Sepsis Labs: Invalid input(s): PROCALCITONIN, [place]  Microbiology: Recent Results (from the past 240 hour(s))  Urine culture     Status: Abnormal   Collection Time: 05/04/19  3:47 PM   Specimen: Urine, Clean Catch  Result Value Ref Range Status   Specimen Description URINE, CLEAN CATCH  Final   Special Requests   Final    NONE Performed at [facility], [place] [address] [place], [place] [address]    Culture >=100,000 COLONIES/mL ESCHERICHIA COLI (A)  Final   Report Status 05/06/2019 FINAL  Final   Organism ID, Bacteria ESCHERICHIA COLI (A)  Final      Susceptibility   Escherichia coli - MIC*    AMPICILLIN 8 SENSITIVE Sensitive     CEFAZOLIN <=4 SENSITIVE Sensitive     CEFTRIAXONE <=0.25 SENSITIVE Sensitive     CIPROFLOXACIN <=0.25 SENSITIVE Sensitive     GENTAMICIN <=1 SENSITIVE Sensitive     IMIPENEM <=0.25 SENSITIVE Sensitive     NITROFURANTOIN <=16 SENSITIVE Sensitive     TRIMETH/SULFA <=20 SENSITIVE Sensitive     AMPICILLIN/SULBACTAM 4 SENSITIVE Sensitive     PIP/TAZO <=4 SENSITIVE Sensitive     *  >=100,000 COLONIES/mL ESCHERICHIA COLI  SARS CORONAVIRUS 2 (TAT 6-24 HRS) Nasopharyngeal Nasopharyngeal Swab     Status: None   Collection Time: 05/05/19  8:06 AM   Specimen: Nasopharyngeal Swab  Result Value Ref Range Status   SARS Coronavirus 2 NEGATIVE NEGATIVE Final    Comment: (NOTE) SARS-CoV-2 target nucleic acids are NOT DETECTED. The SARS-CoV-2 RNA is generally detectable in upper and lower respiratory specimens during the acute phase of infection. Negative results do not preclude SARS-CoV-2 infection, do not rule out co-infections with other pathogens, and should not be used as the sole basis for treatment or other patient management decisions. Negative results must be combined with clinical observations, patient history, and epidemiological information. The expected result is Negative. Fact Sheet for Patients: SugarRoll.be Fact Sheet for Healthcare Providers: https://www.woods-mathews.com/ This test is not yet approved or cleared by the [place] FDA and  has been authorized for detection and/or diagnosis of SARS-CoV-2 by FDA under an Emergency Use Authorization (EUA). This EUA will remain  in effect (meaning this test can be used) for the duration of the COVID-19 declaration under Section 56 4(b)(1) of the Act, 21 U.S.C. section 360bbb-3(b)(1), unless the authorization is terminated or revoked sooner. Performed at [facility], [place] [address] [place], [place] [address]     Radiology Studies: No results found.  Total time spent 27 minutes Darliss Cheney, MD [facility]  If 7PM-7AM, please contact night-coverage [email or web address] 05/10/2019, 1:21 PM

## 2019-05-10 NOTE — Progress Notes (Addendum)
Patient ID: Austin Cobb, male   DOB: 1960-07-06, 59 y.o.   MRN: [id number]     Advanced Heart Failure Rounding Note  PCP-Cardiologist: Donato Heinz, MD   Subjective:    Per night nurse, ongoing issues w/ delirum/hallucinations again last night.  Got multiple doses of Ativan overnight. A bit confused this morning. Asking for an ice cream cone and a blanket.   Remains in atrial flutter, rates in the 110s. BP still elevated. SCr improved, 0.84 today.   Co-ox 73%. CVP around 7.    Objective:   Weight Range: 62.7 kg Body mass index is 20.41 kg/m.   Vital Signs:   Temp:  [96.9 F (36.1 C)-97.9 F (36.6 C)] 97 F (36.1 C) (03/01 0403) Pulse Rate:  [92-123] 114 (03/01 0403) Resp:  [20] 20 (03/01 0403) BP: (126-193)/(73-100) 172/100 (03/01 0403) SpO2:  [94 %-100 %] 100 % (03/01 0403) Weight:  [62.7 kg] 62.7 kg (03/01 0403) Last BM Date: 05/08/19  Weight change: Filed Weights   05/08/19 0327 05/09/19 0421 05/10/19 0403  Weight: 62.5 kg 60.7 kg 62.7 kg    Intake/Output:   Intake/Output Summary (Last 24 hours) at 05/10/2019 0709 Last data filed at 05/09/2019 2300 Gross per 24 hour  Intake 650.35 ml  Output 700 ml  Net -49.65 ml      Physical Exam    PHYSICAL EXAM: General:  Thin male, looks much older than actual age, resting tremor. No respiratory difficulty HEENT: normal Neck: supple. no JVD. Carotids 2+ bilat; no bruits. No lymphadenopathy or thyromegaly appreciated. Cor: PMI nondisplaced. Irregular rhythm, tachy rate. No rubs, gallops or murmurs. Lungs: clear Abdomen: soft, nontender, nondistended. No hepatosplenomegaly. No bruits or masses. Good bowel sounds. Extremities: no cyanosis, clubbing, rash, edema Neuro: alert but confused, cranial nerves grossly intact. moves all 4 extremities w/o difficulty. Affect pleasant.    Telemetry   Atrial flutter 110s (personally reviewed)  Labs    CBC Recent Labs    05/08/19 0527 05/09/19 0537  WBC 8.9  7.4  HGB 10.1* 11.1*  HCT 29.8* 33.1*  MCV 92.8 93.0  PLT 115* [id number]*   Basic Metabolic Panel Recent Labs    05/09/19 0537 05/10/19 0525  NA 137 135  K 3.4* 3.4*  CL 105 101  CO2 22 22  GLUCOSE 143* 209*  BUN 18 9  CREATININE 1.07 0.84  CALCIUM 8.3* 8.3*  MG 1.7 1.7  PHOS 2.3* 2.7   Liver Function Tests Recent Labs    05/09/19 0537 05/10/19 0525  ALBUMIN 2.9* 2.8*   No results for input(s): LIPASE, AMYLASE in the last 72 hours. Cardiac Enzymes No results for input(s): CKTOTAL, CKMB, CKMBINDEX, TROPONINI in the last 72 hours.  BNP: BNP (last 3 results) Recent Labs    05/04/19 1540  BNP 2,060.2*    ProBNP (last 3 results) No results for input(s): PROBNP in the last 8760 hours.   D-Dimer No results for input(s): DDIMER in the last 72 hours. Hemoglobin A1C No results for input(s): HGBA1C in the last 72 hours. Fasting Lipid Panel No results for input(s): CHOL, HDL, LDLCALC, TRIG, CHOLHDL, LDLDIRECT in the last 72 hours. Thyroid Function Tests No results for input(s): TSH, T4TOTAL, T3FREE, THYROIDAB in the last 72 hours.  Invalid input(s): FREET3  Other results:   Imaging    No results found.   Medications:     Scheduled Medications: . chlordiazePOXIDE  25 mg Oral TID  . Chlorhexidine Gluconate Cloth  6 each Topical Daily  .  digoxin  0.125 mg Oral Daily  . folic acid  1 mg Oral Daily  . isosorbide-hydrALAZINE  2 tablet Oral TID  . lactulose  20 g Oral BID   Or  . lactulose  300 mL Rectal BID  . multivitamin with minerals  1 tablet Oral Daily  . nicotine  21 mg Transdermal Daily  . sodium chloride flush  10-40 mL Intracatheter Q12H  . sodium chloride flush  3 mL Intravenous Q12H  . spironolactone  25 mg Oral Daily  . thiamine  100 mg Oral Daily   Or  . thiamine  100 mg Intravenous Daily    Infusions: . amiodarone 60 mg/hr (05/10/19 0331)  . heparin 800 Units/hr (05/09/19 0556)  . milrinone 0.125 mcg/kg/min (05/09/19 0918)    PRN  Medications: LORazepam **OR** LORazepam, metoprolol tartrate, sodium chloride flush   Assessment/Plan   1. Altered mental status/delirium: Suspect ETOH withdrawal with significant rise in BP and history of heavy ETOH.  Also possible hepatic encephalopathy with elevated NH3. Sleeping today after several doses of Ativan overnight.  - On CIWA protocol, has been getting Ativan.  - On lactulose.  2.  AKI: Suspect cardiorenal, possibly with low output. Contrast nephropathy from PE CT may also play a role.  Creatinine improving on milrinone, down to 0.84 today. 3. Acute systolic CHF: Echo this admission with EF 15%, severe RV dysfunction.  No prior cardiac history.  Etiology is uncertain => could be tachycardia-mediated CMP but exertional dyspnea pre-dated palpitations, could be due to long-standing heavy ETOH use, could be viral cardiomyopathy, cannot rule out CAD. He had DCCV but recurrence of atrial flutter on 2/27  and remains in flutter today, rates in the 110s.  Co-ox markedly low, milrinone started and co-ox up to 73% today.  Creatinine improving, down to 0.84  CVP 7.  - Continue milrinone 0.125 mcg/kg/min.   - CVP 7 and poor po intake, no Lasix for now.   - Hold Toprol XL for now with low output. - Continue Bidil 2 tabs tid.  - Continue spironolactone to 25 mg daily  - Continue digoxin 0.125 daily.  - With improved renal function, can add Entresto 24-26 bid. - Replace K and Mg.  - Eventual right/left heart cath once delirium improved.  4. Atrial flutter: AFL with RVR at admission, uncertain how long.  He had felt palpitations for about a week.  Possible component of tachy-mediated CMP.  He is s/p TEE-guided DCCV to NSR but atrial flutter recurred on 2/27.  - Continue amiodarone gtt 60 mg/hr - monitor on milrinone   - Once he has had cath and is off milrinone, will repeat DCCV if he remains in flutter.  - Consider ablation down the road, especially if he can stop drinking.  - Hold Eliquis  for now and cover with heparin gtt.   - repleate K (3.4) and Mg (1.7) 5. ETOH abuse: Have strongly encouraged him to quit.  Rehab program would be helpful. Now with withdrawal as above.   Length of Stay: [address] PA-C  05/10/2019, 7:09 AM  Advanced Heart Failure Team Pager [phone] (M-F; 7a - 4p)  Please contact [facility] for night-coverage after hours (4p -7a ) and weekends on [email or web address]  Patient seen with PA, agree with the above note.   He is still confused and remains sedated.  Able to answer simple questions.  CVP 7 today with co-ox 73%. Remains in atrial flutter rate about 110.   General: NAD Neck:  No JVD, no thyromegaly or thyroid nodule.  Lungs: Clear to auscultation bilaterally with normal respiratory effort. CV: Nondisplaced PMI.  Heart mildly tachy, regular S1/S2, no S3/S4, no murmur.  No peripheral edema.   Abdomen: Soft, nontender, no hepatosplenomegaly, no distention.  Skin: Intact without lesions or rashes.  Neurologic: Confused  Extremities: No clubbing or cyanosis.  HEENT: Normal.   Stable today but still confused.  Good co-ox and CVP.  - Stop milrinone today.  - With elevated BP, will add Entresto 24/26 bid.  - He does not need diuretic.   When confusion resolved, needs RHC/LHC then repeat DCCV.   Loralie Champagne 05/10/2019 12:47 PM

## 2019-05-11 LAB — CBC
HCT: 36.6 % — ABNORMAL LOW (ref 39.0–52.0)
Hemoglobin: 12.4 g/dL — ABNORMAL LOW (ref 13.0–17.0)
MCH: 30.8 pg (ref 26.0–34.0)
MCHC: 33.9 g/dL (ref 30.0–36.0)
MCV: 91 fL (ref 80.0–100.0)
Platelets: 101 10*3/uL — ABNORMAL LOW (ref 150–400)
RBC: 4.02 MIL/uL — ABNORMAL LOW (ref 4.22–5.81)
RDW: 14.6 % (ref 11.5–15.5)
WBC: 5.9 10*3/uL (ref 4.0–10.5)
nRBC: 0.3 % — ABNORMAL HIGH (ref 0.0–0.2)

## 2019-05-11 LAB — BASIC METABOLIC PANEL
Anion gap: 9 (ref 5–15)
BUN: 8 mg/dL (ref 6–20)
CO2: 23 mmol/L (ref 22–32)
Calcium: 8.3 mg/dL — ABNORMAL LOW (ref 8.9–10.3)
Chloride: 105 mmol/L (ref 98–111)
Creatinine, Ser: 0.95 mg/dL (ref 0.61–1.24)
GFR calc Af Amer: >60 mL/min (ref >60)
GFR calc non Af Amer: >60 mL/min (ref >60)
Glucose, Bld: 102 mg/dL — ABNORMAL HIGH (ref 70–99)
Potassium: 3.6 mmol/L (ref 3.5–5.1)
Sodium: 137 mmol/L (ref 135–145)

## 2019-05-11 LAB — MAGNESIUM: Magnesium: 1.6 mg/dL — ABNORMAL LOW (ref 1.7–2.4)

## 2019-05-11 LAB — HEPARIN LEVEL (UNFRACTIONATED): Heparin Unfractionated: 0.33 IU/mL (ref 0.30–0.70)

## 2019-05-11 LAB — COOXEMETRY PANEL
Carboxyhemoglobin: 1.2 % (ref 0.5–1.5)
Methemoglobin: 0.6 % (ref 0.0–1.5)
O2 Saturation: 78.2 %
Total hemoglobin: 12.6 g/dL (ref 12.0–16.0)

## 2019-05-11 LAB — APTT: aPTT: 72 seconds — ABNORMAL HIGH (ref 24–36)

## 2019-05-11 MED ORDER — SODIUM CHLORIDE 0.9% FLUSH: 3.0000 mL | INTRAVENOUS | Status: DC | PRN

## 2019-05-11 MED ORDER — TRAMADOL HCL 50 MG PO TABS
50.0000 mg | ORAL_TABLET | Freq: Two times a day (BID) | ORAL | Status: AC | PRN
  Administered 2019-05-11: 23:00:00 50 mg via ORAL
  Filled 2019-05-11: qty 1

## 2019-05-11 MED ORDER — SODIUM CHLORIDE 0.9 % IV SOLN: INTRAVENOUS | Status: DC

## 2019-05-11 MED ORDER — SODIUM CHLORIDE 0.9% FLUSH
3.0000 mL | Freq: Two times a day (BID) | INTRAVENOUS | Status: DC
  Administered 2019-05-14 – 2019-05-17 (×6): 3 mL via INTRAVENOUS

## 2019-05-11 MED ORDER — CHLORDIAZEPOXIDE HCL 25 MG PO CAPS: 25.0000 mg | ORAL_CAPSULE | Freq: Two times a day (BID) | ORAL | Status: DC

## 2019-05-11 MED ORDER — MAGNESIUM SULFATE 2 GM/50ML IV SOLN
2.0000 g | Freq: Once | INTRAVENOUS | Status: AC
  Administered 2019-05-11: 2 g via INTRAVENOUS
  Filled 2019-05-11: qty 50

## 2019-05-11 MED ORDER — POTASSIUM CHLORIDE CRYS ER 20 MEQ PO TBCR
40.0000 meq | EXTENDED_RELEASE_TABLET | Freq: Once | ORAL | Status: AC
  Administered 2019-05-11: 40 meq via ORAL
  Filled 2019-05-11: qty 2

## 2019-05-11 MED ORDER — CARVEDILOL 3.125 MG PO TABS
3.1250 mg | ORAL_TABLET | Freq: Two times a day (BID) | ORAL | Status: DC
  Administered 2019-05-12: 3.125 mg via ORAL
  Filled 2019-05-11 (×2): qty 1

## 2019-05-11 MED ORDER — ASPIRIN 81 MG PO CHEW
81.0000 mg | CHEWABLE_TABLET | ORAL | Status: AC
  Administered 2019-05-12: 81 mg via ORAL
  Filled 2019-05-11: qty 1

## 2019-05-11 MED ORDER — SODIUM CHLORIDE 0.9 % IV SOLN: 250.0000 mL | INTRAVENOUS | Status: DC | PRN

## 2019-05-11 MED ORDER — HYDRALAZINE HCL 20 MG/ML IJ SOLN
10.0000 mg | Freq: Four times a day (QID) | INTRAMUSCULAR | Status: DC | PRN
  Administered 2019-05-11 – 2019-05-16 (×3): 10 mg via INTRAVENOUS
  Filled 2019-05-11 (×3): qty 1

## 2019-05-11 MED ORDER — CHLORDIAZEPOXIDE HCL 25 MG PO CAPS
25.0000 mg | ORAL_CAPSULE | Freq: Two times a day (BID) | ORAL | Status: DC
  Administered 2019-05-11 – 2019-05-13 (×4): 25 mg via ORAL
  Filled 2019-05-11 (×4): qty 1

## 2019-05-11 NOTE — Progress Notes (Signed)
PROGRESS NOTE  Austin Cobb [id number] DOB: 1960-04-05   PCP: Patient, No Pcp Per  Patient is from: Home.  Lives with a friend.  Ambulates independently at baseline.  DOA: 05/04/2019 LOS: 7  Brief Narrative / Interim history: 59 year old male with history of tobacco, EtOH and polysubstance abuse, bipolar disorder, anxiety and depression presenting with progressive dyspnea for 1 day and exertional dyspnea and fatigue for over 2 months.  Also had paroxysmal nocturnal dyspnea.   In ED, found to be in A.  Flutter with RVR with HR in the 130s.  Also hypertensive with SBP in 170s and DBP in 130s.  UDS negative.  Mildly elevated AST and ALT.BNP 2000.  High-sensitivity troponin 48> 50.  EKG revealed a flutter and LVH.  Lactic acid 2.1> 1.7.  Hgb 11.7.  UA with large LE and many bacteria.  POC COVID-19 test negative.  Cardiology consulted.  Patient admitted for atrial flutter with RVR and unknown type acute CHF.   D-dimer elevated.  CTA chest negative for PE.  Echo with EF of 10 to 15%, global hypokinesis, unknown diastolic function, severely reduced RVEF and severely enlarged RV with RVSP of 28.2 mmHg.   Patient underwent TEE guided cardioversion on 05/06/2019 and converted to NSR. TEE with EF of 10%, moderate MR, no thrombus or significant ASD or PFO.   Advanced heart failure team involved.  Started high-dose IV Lasix but renal function gotten worse.  Assessment & Plan: Acute systolic CHF with severely reduced RVEF: BNP 2000.  LVH on EKG.  CXR with cardiomegaly and small right pleural effusion. CTA negative for PE but bilateral pleural effusion. Cr 1.19 (admit)> 1.42> 1.52> 2.58> 2.18 -Advanced HF team managing-diuretics on hold due to renal function. -Was on milrinone gtt, BiDil and Aldactone.  BiDil and milrinone stopped by cardiology  on 05/10/2019.  Started on [place]. -Monitor fluid status, renal function and lytes.  Persistent atrial flutter with RVR: RVR resolved.  CHA2DS2-VASc score  of 2.  TTE and TEE as below.  -2/25-successful TEE/DCCV on 2/25. Converted to NSR but then flipped back into atrial fibrillation on the afternoon of 05/08/2019.  Cardiology managing.  Amiodarone increased.  Digoxin added.  Aldactone increased.  Remains on heparin drip.  Plan for repeat DCCV cardioversion once his acute encephalopathy is resolved.  Hopefully tomorrow by cardiology.  Acute metabolic encephalopathy/alcohol withdrawal: Reportedly has history of DTs in the past.  Patient started having alcohol withdrawal since the evening of 05/08/2019.  Has received almost 16 mg of Ativan in those 24 hours.  He was started on Librium 2 days ago.  He is feeling much better.  For the first time, he is alert and oriented x2.  Wanted to get off of the mitten.  Will back off on Librium to twice daily from 3 times daily and continue CIWA protocol.    Acute kidney injury/azotemia: Baseline Cr ~1.1>>1.19 (admit)> 1.42> 1.52> 2.58>2.18> 1.07> 0.84> 0.95.  resolved now.  Likely a combination of CHF/atrial flutter, diuretics and possibly contrast from CTA -Diuretics on hold.  -Continue monitoring  Elevated d-dimer: CTA negative for PE but moderate bilateral pleural effusions, right > left.   Elevated troponin: Likely demand ischemia from a flutter and CHF.  TTE and TEE as above. -Cardiology following  Elevated liver enzymes: Likely due to alcohol, CHF and possibly due to amiodarone.  CK within normal.  Acute hepatitis panel and HIV negative. -Manage CHF and EtOH withdrawal as above as above  Hypokalemia hypomagnesemia/hypomagnesemia:  -3.4 potassium.  Replace.  Magnesium normal.  E. coli UTI: Patient have symptoms although chronic.  Urine culture with pansensitive E. coli. -CTX 2/23-2/27 followed by amoxicillin which she completed the course.  Essential hypertension: Slightly elevated blood pressure.  Aldactone and amiodarone increased.  History of polysubstance use -Cessation counseling when able -Consult  CSW for resources  Tobacco use disorder: Smokes about 1 pack a day. -Encourage cessation -Nicotine patch  Generalized weakness/debility -PT/OT eval  Euthyroid sick syndrome: TSH 5.4.  Free T4 1.68 -Repeat thyroid panel in 4 to 6 weeks outpatient.  Anion gap metabolic acidosis: Due to AKI/azotemia?  Improved. -Continue monitoring.   DVT prophylaxis: Heparin GTT Code Status: Full code Family Communication: No family present.  Discharge barrier: Acute decompensated CHF and encephalopathy/alcohol withdrawal/persistent atrial fibrillation Patient is from: Home.  Lives with friend. Final disposition: To be determined based on clinical progress and PT assessment once clinically improved  Consultants: Cardiology   Subjective: Patient seen and examined.  For the first time, he is alert and oriented x2.  Denied any complaint.  Wanted to get off of the mittens.  Objective: Vitals:   05/10/19 2350 05/11/19 0412 05/11/19 0831 05/11/19 1011  BP:  (!) 148/87 (!) 165/98   Pulse:  (!) 105  97  Resp:      Temp: (!) 97.4 F (36.3 C) 97.6 F (36.4 C) 97.9 F (36.6 C)   TempSrc: Oral Oral Axillary   SpO2:  99%    Weight:  63 kg    Height:        Intake/Output Summary (Last 24 hours) at 05/11/2019 1250 Last data filed at 05/11/2019 0700 Gross per 24 hour  Intake 1735.97 ml  Output --  Net 1735.97 ml   Filed Weights   05/09/19 0421 05/10/19 0403 05/11/19 0412  Weight: 60.7 kg 62.7 kg 63 kg    Examination:  General exam: Appears calm and comfortable  Respiratory system: Clear to auscultation. Respiratory effort normal. Cardiovascular system: S1 & S2 heard, irregularly irregular rate and rhythm. No JVD, murmurs, rubs, gallops or clicks. No pedal edema. Gastrointestinal system: Abdomen is nondistended, soft and nontender. No organomegaly or masses felt. Normal bowel sounds heard. Central nervous system: Alert and oriented. No focal neurological deficits. Extremities: Symmetric 5 x 5  power. Skin: No rashes, lesions or ulcers.    Procealdures:  2/24-TTE with EF of 10 to 15%, global hypokinesis, unknown diastolic function, severely reduced RVEF and severely enlarged RV with RVSP of 28.2 mmHg.   2/25-TEE with EF of 10%, moderate MR, no thrombus or significant ASD or PFO.  Successful cardioversion with conversion to NSR in 60's but now HR in 110.    Microbiology summarized: [id number] negative.  Sch Meds:  Scheduled Meds: . chlordiazePOXIDE  25 mg Oral TID  . Chlorhexidine Gluconate Cloth  6 each Topical Daily  . digoxin  0.125 mg Oral Daily  . folic acid  1 mg Oral Daily  . isosorbide-hydrALAZINE  2 tablet Oral TID  . lactulose  20 g Oral BID   Or  . lactulose  300 mL Rectal BID  . multivitamin with minerals  1 tablet Oral Daily  . nicotine  21 mg Transdermal Daily  . sacubitril-valsartan  1 tablet Oral BID  . sodium chloride flush  10-40 mL Intracatheter Q12H  . sodium chloride flush  3 mL Intravenous Q12H  . spironolactone  25 mg Oral Daily  . thiamine  100 mg Oral Daily   Or  . thiamine  100 mg  Intravenous Daily   Continuous Infusions: . amiodarone 60 mg/hr (05/11/19 1129)  . heparin 950 Units/hr (05/11/19 1155)   PRN Meds:.LORazepam **OR** LORazepam, metoprolol tartrate, sodium chloride flush  Antimicrobials: Anti-infectives (From admission, onward)   Start     Dose/Rate Route Frequency Ordered Stop   05/08/19 1400  amoxicillin (AMOXIL) capsule 500 mg     500 mg Oral Every 8 hours 05/08/19 0751 05/09/19 2159   05/05/19 1900  cefTRIAXone (ROCEPHIN) 1 g in sodium chloride 0.9 % 100 mL IVPB  Status:  Discontinued     1 g 200 mL/hr over 30 Minutes Intravenous Every 24 hours 05/04/19 2110 05/08/19 0751   05/04/19 1730  cefTRIAXone (ROCEPHIN) 1 g in sodium chloride 0.9 % 100 mL IVPB     1 g 200 mL/hr over 30 Minutes Intravenous  Once 05/04/19 1724 05/04/19 2005       I have personally reviewed the following labs and images: CBC: Recent Labs  Lab  05/04/19 1540 05/04/19 2144 05/07/19 0409 05/08/19 0527 05/09/19 0537 05/10/19 0525 05/11/19 0527  WBC 6.5   < > 9.1 8.9 7.4 6.3 5.9  NEUTROABS 4.2  --   --   --   --   --   --   HGB 11.7*   < > 13.2 10.1* 11.1* 11.9* 12.4*  HCT 35.2*   < > 40.5 29.8* 33.1* 35.3* 36.6*  MCV 96.2   < > 96.9 92.8 93.0 91.9 91.0  PLT 152   < > 165 115* 112* 108* 101*   < > = values in this interval not displayed.   BMP &GFR Recent Labs  Lab 05/07/19 0409 05/07/19 0409 05/07/19 1253 05/07/19 1253 05/07/19 1357 05/08/19 0527 05/09/19 0537 05/10/19 0525 05/11/19 0527  NA 136   < > 139   < > 137 136 137 135 137  K 6.2*   < > 5.0   < > 4.5 3.1* 3.4* 3.4* 3.6  CL 101   < > 103   < > 103 102 105 101 105  CO2 15*   < > 16*   < > 18* 20* 22 22 23   GLUCOSE 114*   < > 122*   < > 145* 143* 143* 209* 102*  BUN 33*   < > 37*   < > 39* 41* 18 9 8   CREATININE 2.41*   < > 2.55*   < > 2.58* 2.18* 1.07 0.84 0.95  CALCIUM 8.9   < > 8.6*   < > 8.3* 8.0* 8.3* 8.3* 8.3*  MG 2.5*  --   --   --   --  2.0 1.7 1.7 1.6*  PHOS 7.3*  --  7.2*  --   --  4.3 2.3* 2.7  --    < > = values in this interval not displayed.   Estimated Creatinine Clearance: 74.6 mL/min (by C-G formula based on SCr of 0.95 mg/dL). Liver & Pancreas: Recent Labs  Lab 05/04/19 1540 05/04/19 1540 05/04/19 2144 05/06/19 0549 05/07/19 0409 05/07/19 1253 05/08/19 0527 05/09/19 0537 05/10/19 0525  AST 66*  --  77*  --  170*  --   --   --   --   ALT 46*  --  53*  --  107*  --   --   --   --   ALKPHOS 73  --  76  --  108  --   --   --   --   BILITOT 0.9  --  1.1  --  0.9  --   --   --   --   PROT 6.4*  --  6.5  --  6.7  --   --   --   --   ALBUMIN 3.4*   < > 3.5   < > 3.5  3.6 3.3* 3.0* 2.9* 2.8*   < > = values in this interval not displayed.   No results for input(s): LIPASE, AMYLASE in the last 168 hours. Recent Labs  Lab 05/07/19 1206 05/08/19 0527 05/10/19 2119  AMMONIA 60* 28 29   Diabetic: No results for input(s): HGBA1C in  the last 72 hours. Recent Labs  Lab 05/05/19 1810 05/05/19 2326 05/06/19 2041 05/10/19 2149  GLUCAP 103* 122* 111* 103*   Cardiac Enzymes: Recent Labs  Lab 05/05/19 1006  CKTOTAL 116   No results for input(s): PROBNP in the last 8760 hours. Coagulation Profile: Recent Labs  Lab 05/06/19 0549  INR 1.5*   Thyroid Function Tests: No results for input(s): TSH, T4TOTAL, FREET4, T3FREE, THYROIDAB in the last 72 hours. Lipid Profile: No results for input(s): CHOL, HDL, LDLCALC, TRIG, CHOLHDL, LDLDIRECT in the last 72 hours. Anemia Panel: No results for input(s): VITAMINB12, FOLATE, FERRITIN, TIBC, IRON, RETICCTPCT in the last 72 hours. Urine analysis:    Component Value Date/Time   COLORURINE AMBER (A) 05/04/2019 1600   APPEARANCEUR HAZY (A) 05/04/2019 1600   LABSPEC 1.024 05/04/2019 1600   PHURINE 5.0 05/04/2019 1600   GLUCOSEU NEGATIVE 05/04/2019 1600   HGBUR NEGATIVE 05/04/2019 1600   BILIRUBINUR NEGATIVE 05/04/2019 1600   KETONESUR NEGATIVE 05/04/2019 1600   PROTEINUR 100 (A) 05/04/2019 1600   NITRITE NEGATIVE 05/04/2019 1600   LEUKOCYTESUR LARGE (A) 05/04/2019 1600   Sepsis Labs: Invalid input(s): PROCALCITONIN, [place]  Microbiology: Recent Results (from the past 240 hour(s))  Urine culture     Status: Abnormal   Collection Time: 05/04/19  3:47 PM   Specimen: Urine, Clean Catch  Result Value Ref Range Status   Specimen Description URINE, CLEAN CATCH  Final   Special Requests   Final    NONE Performed at [facility], [place] [address] [place], [place] [address]    Culture >=100,000 COLONIES/mL ESCHERICHIA COLI (A)  Final   Report Status 05/06/2019 FINAL  Final   Organism ID, Bacteria ESCHERICHIA COLI (A)  Final      Susceptibility   Escherichia coli - MIC*    AMPICILLIN 8 SENSITIVE Sensitive     CEFAZOLIN <=4 SENSITIVE Sensitive     CEFTRIAXONE <=0.25 SENSITIVE Sensitive     CIPROFLOXACIN <=0.25 SENSITIVE Sensitive     GENTAMICIN <=1 SENSITIVE  Sensitive     IMIPENEM <=0.25 SENSITIVE Sensitive     NITROFURANTOIN <=16 SENSITIVE Sensitive     TRIMETH/SULFA <=20 SENSITIVE Sensitive     AMPICILLIN/SULBACTAM 4 SENSITIVE Sensitive     PIP/TAZO <=4 SENSITIVE Sensitive     * >=100,000 COLONIES/mL ESCHERICHIA COLI  SARS CORONAVIRUS 2 (TAT 6-24 HRS) Nasopharyngeal Nasopharyngeal Swab     Status: None   Collection Time: 05/05/19  8:06 AM   Specimen: Nasopharyngeal Swab  Result Value Ref Range Status   SARS Coronavirus 2 NEGATIVE NEGATIVE Final    Comment: (NOTE) SARS-CoV-2 target nucleic acids are NOT DETECTED. The SARS-CoV-2 RNA is generally detectable in upper and lower respiratory specimens during the acute phase of infection. Negative results do not preclude SARS-CoV-2 infection, do not rule out co-infections with other pathogens, and should not be used as  the sole basis for treatment or other patient management decisions. Negative results must be combined with clinical observations, patient history, and epidemiological information. The expected result is Negative. Fact Sheet for Patients: SugarRoll.be Fact Sheet for Healthcare Providers: https://www.woods-mathews.com/ This test is not yet approved or cleared by the [place] FDA and  has been authorized for detection and/or diagnosis of SARS-CoV-2 by FDA under an Emergency Use Authorization (EUA). This EUA will remain  in effect (meaning this test can be used) for the duration of the COVID-19 declaration under Section 56 4(b)(1) of the Act, 21 U.S.C. section 360bbb-3(b)(1), unless the authorization is terminated or revoked sooner. Performed at [facility], [place] [address] [place], [place] [address]     Radiology Studies: No results found.  Total time spent 25 minutes Darliss Cheney, MD [facility]  If 7PM-7AM, please contact night-coverage [email or web address] 05/11/2019, 12:50 PM

## 2019-05-11 NOTE — Progress Notes (Addendum)
Patient ID: Austin Cobb, male   DOB: [birth year], 59 y.o.   MRN: [id number]     Advanced Heart Failure Rounding Note  PCP-Cardiologist: Donato Heinz, MD   Subjective:    Co-ox 78%. CVP 6-7  Entresto added yesterday. BP improved. SCr/K stable.   Remains in atrial flutter. Ventricular rates low 100 currently.   Night RN reports no issues overnight. No agitation/ confusion. Did not require ativan.   No complaints today. Feels "fair".  Objective:   Weight Range: 63 kg Body mass index is 20.51 kg/m.   Vital Signs:   Temp:  [97.4 F (36.3 C)-97.8 F (36.6 C)] 97.6 F (36.4 C) (03/02 0412) Pulse Rate:  [103-114] 105 (03/02 0412) Resp:  [16] 16 (03/01 1955) BP: (148-166)/(87-113) 148/87 (03/02 0412) SpO2:  [98 %-100 %] 99 % (03/02 0412) Weight:  [63 kg] 63 kg (03/02 0412) Last BM Date: 05/08/19  Weight change: Filed Weights   05/09/19 0421 05/10/19 0403 05/11/19 0412  Weight: 60.7 kg 62.7 kg 63 kg    Intake/Output:   Intake/Output Summary (Last 24 hours) at 05/11/2019 0705 Last data filed at 05/11/2019 0040 Gross per 24 hour  Intake 230 ml  Output 800 ml  Net -570 ml      Physical Exam    PHYSICAL EXAM: CVP 6-7 General:  Thin male. No respiratory difficulty HEENT: normal Neck: supple. no JVD. Carotids 2+ bilat; no bruits. No lymphadenopathy or thyromegaly appreciated. Cor: PMI nondisplaced. Irregular rhythm, tachy rate. No rubs, gallops or murmurs. Lungs: clear Abdomen: soft, nontender, nondistended. No hepatosplenomegaly. No bruits or masses. Good bowel sounds. Extremities: no cyanosis, clubbing, rash, edema Neuro: alert but confused, cranial nerves grossly intact. moves all 4 extremities w/o difficulty. Affect pleasant.   Telemetry   Atrial flutter low 100s (personally reviewed)  Labs    CBC Recent Labs    05/10/19 0525 05/11/19 0527  WBC 6.3 5.9  HGB 11.9* 12.4*  HCT 35.3* 36.6*  MCV 91.9 91.0  PLT 108* [id number]*   Basic Metabolic  Panel Recent Labs    05/09/19 0537 05/09/19 0537 05/10/19 0525 05/11/19 0527  NA 137   < > 135 137  K 3.4*   < > 3.4* 3.6  CL 105   < > 101 105  CO2 22   < > 22 23  GLUCOSE 143*   < > 209* 102*  BUN 18   < > 9 8  CREATININE 1.07   < > 0.84 0.95  CALCIUM 8.3*   < > 8.3* 8.3*  MG 1.7   < > 1.7 1.6*  PHOS 2.3*  --  2.7  --    < > = values in this interval not displayed.   Liver Function Tests Recent Labs    05/09/19 0537 05/10/19 0525  ALBUMIN 2.9* 2.8*   No results for input(s): LIPASE, AMYLASE in the last 72 hours. Cardiac Enzymes No results for input(s): CKTOTAL, CKMB, CKMBINDEX, TROPONINI in the last 72 hours.  BNP: BNP (last 3 results) Recent Labs    05/04/19 1540  BNP 2,060.2*    ProBNP (last 3 results) No results for input(s): PROBNP in the last 8760 hours.   D-Dimer No results for input(s): DDIMER in the last 72 hours. Hemoglobin A1C No results for input(s): HGBA1C in the last 72 hours. Fasting Lipid Panel No results for input(s): CHOL, HDL, LDLCALC, TRIG, CHOLHDL, LDLDIRECT in the last 72 hours. Thyroid Function Tests No results for input(s): TSH, T4TOTAL, T3FREE, THYROIDAB in  the last 72 hours.  Invalid input(s): FREET3  Other results:   Imaging    No results found.   Medications:     Scheduled Medications: . chlordiazePOXIDE  25 mg Oral TID  . Chlorhexidine Gluconate Cloth  6 each Topical Daily  . digoxin  0.125 mg Oral Daily  . folic acid  1 mg Oral Daily  . isosorbide-hydrALAZINE  2 tablet Oral TID  . lactulose  20 g Oral BID   Or  . lactulose  300 mL Rectal BID  . multivitamin with minerals  1 tablet Oral Daily  . nicotine  21 mg Transdermal Daily  . sacubitril-valsartan  1 tablet Oral BID  . sodium chloride flush  10-40 mL Intracatheter Q12H  . sodium chloride flush  3 mL Intravenous Q12H  . spironolactone  25 mg Oral Daily  . thiamine  100 mg Oral Daily   Or  . thiamine  100 mg Intravenous Daily    Infusions: .  amiodarone 60 mg/hr (05/11/19 0042)  . heparin 900 Units/hr (05/10/19 1430)    PRN Medications: LORazepam **OR** LORazepam, metoprolol tartrate, sodium chloride flush   Assessment/Plan   1. Altered mental status/delirium: Suspect ETOH withdrawal with significant rise in BP and history of heavy ETOH.  Also possible hepatic encephalopathy with elevated NH3. Improving - On CIWA protocol, has been getting Ativan.  - On lactulose.  2.  AKI: Suspect cardiorenal, possibly with low output. Contrast nephropathy from PE CT may also play a role.  Creatinine improving on milrinone, down to 0.95 today. 3. Acute systolic CHF: Echo this admission with EF 15%, severe RV dysfunction.  No prior cardiac history.  Etiology is uncertain => could be tachycardia-mediated CMP but exertional dyspnea pre-dated palpitations, could be due to long-standing heavy ETOH use, could be viral cardiomyopathy, cannot rule out CAD. He had DCCV but recurrence of atrial flutter on 2/27  and remains in flutter today, rates in the low 100s.  Co-ox markedly low, milrinone started.  He is now off milrinone with stable co-ox 78%.  Creatinine improving, down to 0.95  CVP 7.  - Now off milrinone.  - CVP 7 and poor po intake, no Lasix for now.   - Hold Toprol XL for now with low output. - Continue Bidil 2 tabs tid.  - Continue spironolactone to 25 mg daily  - Continue digoxin 0.125 daily.  - Continue Entresto 24-26 bid. Will titrate as BP allows. - Eventual right/left heart cath once delirium improved. Hopefully tomorrow. 4. Atrial flutter: AFL with RVR at admission, uncertain how long.  He had felt palpitations for about a week.  Possible component of tachy-mediated CMP.  He is s/p TEE-guided DCCV to NSR but atrial flutter recurred on 2/27.  - Continue amiodarone gtt 60 mg/hr - monitor on milrinone   - Once he has had cath and is off milrinone, will repeat DCCV if he remains in flutter.  - Consider ablation down the road, especially if  he can stop drinking.  - Hold Eliquis for now and cover with heparin gtt.   5. ETOH abuse: Have strongly encouraged him to quit.  Rehab program would be helpful. Now with withdrawal as above.   Length of Stay: [address] PA-C  05/11/2019, 7:05 AM  Advanced Heart Failure Team Pager [phone] (M-F; 7a - 4p)  Please contact [facility] for night-coverage after hours (4p -7a ) and weekends on [email or web address]  Patient seen with NP, agree with the above note.  He is now stable off milrinone, co-ox 78% with CVP 7.   He is less confused today, able to carry on coherent conversation.   Creatinine stable.  BP still high at times. He remains in atrial flutter rate 100s.   General: NAD Neck: No JVD, no thyromegaly or thyroid nodule.  Lungs: Clear to auscultation bilaterally with normal respiratory effort. CV: Nondisplaced PMI.  Heart mildly tachy, regular S1/S2, no S3/S4, no murmur.  No peripheral edema.  No carotid bruit.  Normal pedal pulses.  Abdomen: Soft, nontender, no hepatosplenomegaly, no distention.  Skin: Intact without lesions or rashes.  Neurologic: Alert and oriented x 3.  Psych: Normal affect. Extremities: No clubbing or cyanosis.  HEENT: Normal.   Stable off milrinone.  Carefully add low dose Coreg today, continue other meds.   Will plan right/left cath tomorrow if he remains lucid as he is today.  Discussed risks/benefits with patient and he agrees to procedure.   After cath, will plan TEE-DCCV possibly Friday morning.  Continue amiodarone gtt (decrease to 30 mg/hr), continue heparin gtt until after cath then Eliquis.   Loralie Champagne 05/11/2019 1:22 PM

## 2019-05-11 NOTE — Progress Notes (Signed)
ANTICOAGULATION CONSULT NOTE  Pharmacy Consult for Heparin Indication: atrial fibrillation  No Known Allergies  Patient Measurements: Height: 5\' 9"  (175.3 cm) Weight: 138 lb 14.2 oz (63 kg) IBW/kg (Calculated) : 70.7   Vital Signs: Temp: 97.9 F (36.6 C) (03/02 0831) Temp Source: Axillary (03/02 0831) BP: 165/98 (03/02 0831) Pulse Rate: 97 (03/02 1011)  Labs: Recent Labs    05/09/19 0537 05/09/19 0537 05/10/19 0525 05/11/19 0527  HGB 11.1*   < > 11.9* 12.4*  HCT 33.1*  --  35.3* 36.6*  PLT 112*  --  108* 101*  APTT 75*  --  64* 72*  HEPARINUNFRC 1.02*  --  0.49 0.33  CREATININE 1.07  --  0.84 0.95   < > = values in this interval not displayed.    Estimated Creatinine Clearance: 74.6 mL/min (by C-G formula based on SCr of 0.95 mg/dL).  Assessment: 59 year old M w/ new aFlutter (CHADS2VASc = 0-1 for possible HTN), started on apixaban 2/24 and 2/25 transition back to heparin for cath. Apixaban last dose 2/25 9:30. Will follow APTT until correlates with HL. 2/25 TEE/DCCV converted patient to NSR. 2/27 back in aflutter briefly then sinus tachycardia.   Heparin level at goal and aptt within therapeutic range, they appear to be correlating now. H/H up without transfusion, and plt 101. No bleeding issues noted.   Goal of Therapy:  Heparin level 0.3-0.7 units/mL APTT 66-102 secs Monitor platelets by anticoagulation protocol: Yes   Plan:  Increase heparin to 950 units/hr Heparin level and CBC daily Monitor for signs/symptoms of bleeding   Erin Hearing PharmD., BCPS Clinical Pharmacist 05/11/2019 11:39 AM  Please check AMION for all [place] phone numbers After 10:00 PM, call [place] [phone]

## 2019-05-11 NOTE — Progress Notes (Signed)
Patient is oriented X 4, but with poor judgement or understanding/compliance to safety precautions. Intermittently agitated, trying to get out of bed and "go home".  Reminded patient of plan of care and his agreement to move forward with cardiac cath and cardioversion.  Several doses of ativan given per CIWA protocol.  Mitts maintained as a precaution to protect lines and tubes. Tele sitter ordered but none available at this time.  Continue close monitoring by staff.

## 2019-05-12 ENCOUNTER — Encounter (HOSPITAL_COMMUNITY)
Admission: EM | Disposition: A | Discharge status: Skilled Nursing Facility | Payer: Self-pay | Source: Home / Self Care | Attending: Internal Medicine

## 2019-05-12 DIAGNOSIS — D696 Thrombocytopenia, unspecified: Secondary | ICD-10-CM

## 2019-05-12 DIAGNOSIS — R0602 Shortness of breath: Secondary | ICD-10-CM

## 2019-05-12 LAB — CBC
HCT: 38.4 % — ABNORMAL LOW (ref 39.0–52.0)
HCT: 36.6 % — ABNORMAL LOW (ref 39.0–52.0)
Hemoglobin: 13.1 g/dL (ref 13.0–17.0)
Hemoglobin: 12.1 g/dL — ABNORMAL LOW (ref 13.0–17.0)
MCH: 30.8 pg (ref 26.0–34.0)
MCH: 30.4 pg (ref 26.0–34.0)
MCHC: 34.1 g/dL (ref 30.0–36.0)
MCHC: 33.1 g/dL (ref 30.0–36.0)
MCV: 90.4 fL (ref 80.0–100.0)
MCV: 92 fL (ref 80.0–100.0)
Platelets: 48 10*3/uL — ABNORMAL LOW (ref 150–400)
Platelets: 35 10*3/uL — ABNORMAL LOW (ref 150–400)
RBC: 4.25 MIL/uL (ref 4.22–5.81)
RBC: 3.98 MIL/uL — ABNORMAL LOW (ref 4.22–5.81)
RDW: 14.8 % (ref 11.5–15.5)
RDW: 14.9 % (ref 11.5–15.5)
WBC: 6.6 10*3/uL (ref 4.0–10.5)
WBC: 5.6 10*3/uL (ref 4.0–10.5)
nRBC: 0.3 % — ABNORMAL HIGH (ref 0.0–0.2)
nRBC: 0.5 % — ABNORMAL HIGH (ref 0.0–0.2)

## 2019-05-12 LAB — BASIC METABOLIC PANEL
Anion gap: 11 (ref 5–15)
BUN: 6 mg/dL (ref 6–20)
CO2: 23 mmol/L (ref 22–32)
Calcium: 8.6 mg/dL — ABNORMAL LOW (ref 8.9–10.3)
Chloride: 101 mmol/L (ref 98–111)
Creatinine, Ser: 0.88 mg/dL (ref 0.61–1.24)
GFR calc Af Amer: >60 mL/min (ref >60)
GFR calc non Af Amer: >60 mL/min (ref >60)
Glucose, Bld: 132 mg/dL — ABNORMAL HIGH (ref 70–99)
Potassium: 3.9 mmol/L (ref 3.5–5.1)
Sodium: 135 mmol/L (ref 135–145)

## 2019-05-12 LAB — COOXEMETRY PANEL
Carboxyhemoglobin: 1.4 % (ref 0.5–1.5)
Methemoglobin: 0.7 % (ref 0.0–1.5)
O2 Saturation: 83.5 %
Total hemoglobin: 13.2 g/dL (ref 12.0–16.0)

## 2019-05-12 LAB — HEPARIN LEVEL (UNFRACTIONATED): Heparin Unfractionated: 0.38 IU/mL (ref 0.30–0.70)

## 2019-05-12 LAB — APTT
aPTT: 72 seconds — ABNORMAL HIGH (ref 24–36)
aPTT: 66 seconds — ABNORMAL HIGH (ref 24–36)

## 2019-05-12 LAB — MAGNESIUM: Magnesium: 1.5 mg/dL — ABNORMAL LOW (ref 1.7–2.4)

## 2019-05-12 SURGERY — RIGHT/LEFT HEART CATH AND CORONARY ANGIOGRAPHY
Anesthesia: LOCAL

## 2019-05-12 MED ORDER — CARVEDILOL 3.125 MG PO TABS
3.1250 mg | ORAL_TABLET | Freq: Once | ORAL | Status: AC
  Administered 2019-05-12: 3.125 mg via ORAL
  Filled 2019-05-12: qty 1

## 2019-05-12 MED ORDER — SACUBITRIL-VALSARTAN 49-51 MG PO TABS
1.0000 | ORAL_TABLET | Freq: Two times a day (BID) | ORAL | Status: DC
  Administered 2019-05-12: 1 via ORAL
  Filled 2019-05-12 (×2): qty 1

## 2019-05-12 MED ORDER — CARVEDILOL 6.25 MG PO TABS: 6.2500 mg | ORAL_TABLET | Freq: Two times a day (BID) | ORAL | Status: DC

## 2019-05-12 MED ORDER — CARVEDILOL 6.25 MG PO TABS
6.2500 mg | ORAL_TABLET | Freq: Two times a day (BID) | ORAL | Status: DC
  Administered 2019-05-13: 6.25 mg via ORAL
  Filled 2019-05-12: qty 1

## 2019-05-12 MED ORDER — SACUBITRIL-VALSARTAN 24-26 MG PO TABS
1.0000 | ORAL_TABLET | Freq: Once | ORAL | Status: AC
  Administered 2019-05-12: 1 via ORAL
  Filled 2019-05-12: qty 1

## 2019-05-12 MED ORDER — MAGNESIUM SULFATE 50 % IJ SOLN
3.0000 g | Freq: Once | INTRAVENOUS | Status: AC
  Administered 2019-05-12: 3 g via INTRAVENOUS
  Filled 2019-05-12: qty 6

## 2019-05-12 MED ORDER — ENSURE ENLIVE PO LIQD
237.0000 mL | Freq: Four times a day (QID) | ORAL | Status: DC
  Administered 2019-05-12 – 2019-05-26 (×31): 237 mL via ORAL

## 2019-05-12 MED ORDER — SACUBITRIL-VALSARTAN 49-51 MG PO TABS
1.0000 | ORAL_TABLET | Freq: Two times a day (BID) | ORAL | Status: DC
  Filled 2019-05-12: qty 1

## 2019-05-12 MED ORDER — SODIUM CHLORIDE 0.9 % IV SOLN
0.1000 mg/kg/h | INTRAVENOUS | Status: DC
  Administered 2019-05-12: 0.1 mg/kg/h via INTRAVENOUS
  Filled 2019-05-12: qty 250

## 2019-05-12 NOTE — Progress Notes (Signed)
Patient ID: Austin Cobb, male   DOB: April 21, 1960, 59 y.o.   MRN: [id number]     Advanced Heart Failure Rounding Note  PCP-Cardiologist: Donato Heinz, MD   Subjective:    Co-ox 83%. CVP 4.   BP remains high, creatinine stable at 0.88.    Remains in atrial flutter. Ventricular rates around 110 currently.   Sedated but oriented to place.   Platelets have dropped from 100s range to 48. He has been on a heparin gtt.   Objective:   Weight Range: 57.9 kg Body mass index is 18.85 kg/m.   Vital Signs:   Temp:  [97.5 F (36.4 C)-98.2 F (36.8 C)] 98.2 F (36.8 C) (03/03 0805) Pulse Rate:  [97-118] 114 (03/03 0805) BP: (155-182)/(82-113) 159/107 (03/03 0805) SpO2:  [98 %-100 %] 99 % (03/03 0805) Weight:  [57.9 kg] 57.9 kg (03/03 0535) Last BM Date: (unkown)  Weight change: Filed Weights   05/10/19 0403 05/11/19 0412 05/12/19 0535  Weight: 62.7 kg 63 kg 57.9 kg    Intake/Output:   Intake/Output Summary (Last 24 hours) at 05/12/2019 0929 Last data filed at 05/12/2019 0843 Gross per 24 hour  Intake 961.04 ml  Output 1775 ml  Net -813.96 ml      Physical Exam    General: NAD Neck: No JVD, no thyromegaly or thyroid nodule.  Lungs: Clear to auscultation bilaterally with normal respiratory effort. CV: Nondisplaced PMI.  Heart mildly tachy, regular S1/S2, no S3/S4, no murmur.  No peripheral edema.   Abdomen: Soft, nontender, no hepatosplenomegaly, no distention.  Skin: Intact without lesions or rashes.  Neurologic: Sedated but oriented to place.  Psych: Flat affect. Extremities: No clubbing or cyanosis.  HEENT: Normal.    Telemetry   Atrial flutter around 110 (personally reviewed)  Labs    CBC Recent Labs    05/11/19 0527 05/12/19 0519  WBC 5.9 6.6  HGB 12.4* 13.1  HCT 36.6* 38.4*  MCV 91.0 90.4  PLT 101* 48*   Basic Metabolic Panel Recent Labs    05/10/19 0525 05/10/19 0525 05/11/19 0527 05/12/19 0519  NA 135   < > 137 135  K 3.4*   <  > 3.6 3.9  CL 101   < > 105 101  CO2 22   < > 23 23  GLUCOSE 209*   < > 102* 132*  BUN 9   < > 8 6  CREATININE 0.84   < > 0.95 0.88  CALCIUM 8.3*   < > 8.3* 8.6*  MG 1.7   < > 1.6* 1.5*  PHOS 2.7  --   --   --    < > = values in this interval not displayed.   Liver Function Tests Recent Labs    05/10/19 0525  ALBUMIN 2.8*   No results for input(s): LIPASE, AMYLASE in the last 72 hours. Cardiac Enzymes No results for input(s): CKTOTAL, CKMB, CKMBINDEX, TROPONINI in the last 72 hours.  BNP: BNP (last 3 results) Recent Labs    05/04/19 1540  BNP 2,060.2*    ProBNP (last 3 results) No results for input(s): PROBNP in the last 8760 hours.   D-Dimer No results for input(s): DDIMER in the last 72 hours. Hemoglobin A1C No results for input(s): HGBA1C in the last 72 hours. Fasting Lipid Panel No results for input(s): CHOL, HDL, LDLCALC, TRIG, CHOLHDL, LDLDIRECT in the last 72 hours. Thyroid Function Tests No results for input(s): TSH, T4TOTAL, T3FREE, THYROIDAB in the last 72 hours.  Invalid input(s): FREET3  Other results:   Imaging    No results found.   Medications:     Scheduled Medications: . carvedilol  6.25 mg Oral BID WC  . chlordiazePOXIDE  25 mg Oral BID  . Chlorhexidine Gluconate Cloth  6 each Topical Daily  . digoxin  0.125 mg Oral Daily  . folic acid  1 mg Oral Daily  . isosorbide-hydrALAZINE  2 tablet Oral TID  . lactulose  20 g Oral BID   Or  . lactulose  300 mL Rectal BID  . multivitamin with minerals  1 tablet Oral Daily  . nicotine  21 mg Transdermal Daily  . sacubitril-valsartan  1 tablet Oral BID  . sodium chloride flush  10-40 mL Intracatheter Q12H  . sodium chloride flush  3 mL Intravenous Q12H  . sodium chloride flush  3 mL Intravenous Q12H  . spironolactone  25 mg Oral Daily  . thiamine  100 mg Oral Daily   Or  . thiamine  100 mg Intravenous Daily    Infusions: . sodium chloride    . sodium chloride 10 mL/hr at 05/12/19  0700  . amiodarone 30 mg/hr (05/12/19 0700)  . heparin 950 Units/hr (05/12/19 0700)    PRN Medications: sodium chloride, hydrALAZINE, metoprolol tartrate, sodium chloride flush, sodium chloride flush   Assessment/Plan   1. Altered mental status/delirium: Suspect ETOH withdrawal with significant rise in BP and history of heavy ETOH.  Also possible hepatic encephalopathy with elevated NH3. He is still somewhat sedated but not as confused.  - On CIWA protocol, has been getting Ativan.  - On lactulose.  2.  AKI: Suspect cardiorenal, possibly with low output. Contrast nephropathy from PE CT may also play a role.  Creatinine improved, down to 0.88 today. 3. Acute systolic CHF: Echo this admission with EF 15%, severe RV dysfunction.  No prior cardiac history.  Etiology is uncertain => could be tachycardia-mediated CMP but exertional dyspnea pre-dated palpitations, could be due to long-standing heavy ETOH use, could be viral cardiomyopathy, cannot rule out CAD. He had DCCV but recurrence of atrial flutter on 2/27  and remains in flutter today, rates in the low 100s.  Co-ox markedly low, milrinone started.  He is now off milrinone with stable co-ox 83%.  Creatinine improved.  CVP 4. BP still very high.  - No diuretic.  - Continue Bidil 2 tabs tid.  - Continue spironolactone to 25 mg daily  - Continue digoxin 0.125 daily.  - Increase Entresto to 49/51 bid.  - Increase Coreg to 6.25 mg bid.  - Eventual right/left heart cath, had planned today but need to recheck platelets (48K and falling if accurate).  4. Atrial flutter: AFL with RVR at admission, uncertain how long.  He had felt palpitations for about a week.  Possible component of tachy-mediated CMP.  He is s/p TEE-guided DCCV to NSR but atrial flutter recurred on 2/27.  - Continue amiodarone gtt 30 mg/hr - Will need repeat DCCV, had planned to do after cath but now with falling platelets timing is uncertain.  - Consider ablation down the road,  especially if he can stop drinking.  - On heparin gtt, but with fall in plts may need bivalirudin.  5. ETOH abuse: Have strongly encouraged him to quit.  Rehab program would be helpful. Now with withdrawal as above.  6. Thrombocytopenia: Platelets have been in 100s range but dropped to 48K today. He has been on heparin gtt.  - Repeat CBC stat.  -  If truly low, will need HIT antibody sent and heparin stopped.  Would start bivalirudin.   Length of Stay: [place], MD  05/12/2019, 9:29 AM  Advanced Heart Failure Team Pager [phone] (M-F; 7a - 4p)  Please contact [facility] for night-coverage after hours (4p -7a ) and weekends on [email or web address]

## 2019-05-12 NOTE — Progress Notes (Signed)
[place] for Heparin >bivalirudin Indication: atrial fibrillation  Allergies  Allergen Reactions  . Heparin     R/o HIT 3/3    Patient Measurements: Height: 5\' 9"  (175.3 cm) Weight: 127 lb 10.3 oz (57.9 kg) IBW/kg (Calculated) : 70.7   Vital Signs: Temp: 98.2 F (36.8 C) (03/03 0805) Temp Source: Axillary (03/03 0805) BP: 159/107 (03/03 0805) Pulse Rate: 114 (03/03 0805)  Labs: Recent Labs    05/10/19 0525 05/10/19 0525 05/11/19 0527 05/11/19 0527 05/12/19 0519 05/12/19 1041  HGB 11.9*   < > 12.4*   < > 13.1 12.1*  HCT 35.3*   < > 36.6*  --  38.4* 36.6*  PLT 108*   < > 101*  --  48* 35*  APTT 64*  --  72*  --   --   --   HEPARINUNFRC 0.49  --  0.33  --  0.38  --   CREATININE 0.84  --  0.95  --  0.88  --    < > = values in this interval not displayed.    Estimated Creatinine Clearance: 74 mL/min (by C-G formula based on SCr of 0.88 mg/dL).  Assessment: 59 year old M w/ new aFlutter (CHADS2VASc = 0-1 for possible HTN), started on apixaban 2/24 and 2/25 transition back to heparin for cath. Apixaban last dose 2/25 9:30. Will follow APTT until correlates with HL. 2/25 TEE/DCCV converted patient to NSR. 2/27 back in aflutter briefly then sinus tachycardia.   pltc dropped overnight 101>48 rechecked this am resulted 35 - will stop heparin - check for HIT and begin bivalirudin drip for anticoagulation  Goal of Therapy:  APTT 50-80sec Monitor platelets by anticoagulation protocol: Yes   Plan:  Stop heparin Draw HIT antibody Begin bivalirudin 0.1mg /kg/hr  Draw aptt 6hr after drip started and daily Monitor s/s bleeding and pltc   Bonnita Nasuti Pharm.D. CPP, BCPS Clinical Pharmacist [phone] 05/12/2019 12:10 PM     Please check AMION for all [facility] phone numbers After 10:00 PM, call [place] [phone]

## 2019-05-12 NOTE — Progress Notes (Signed)
Notified Dr Aundra Dubin of repeat CBC, platelets now 35.  Dr Aundra Dubin said cancel cath and stop Heparin.  New orders in Epic

## 2019-05-12 NOTE — Progress Notes (Addendum)
PROGRESS NOTE    Austin Cobb  [id number] DOB: 06-21-1960 DOA: 05/04/2019 PCP: Patient, No Pcp Per     Brief Narrative:  59 year old BM PMHx Bipolar disorder, Anxiety and Depression tobacco, EtOH and polysubstance abuse,    Presenting with progressive dyspnea for 1 day and exertional dyspnea and fatigue for over 2 months.  Also had paroxysmal nocturnal dyspnea.   In ED, found to be in A.  Flutter with RVR with HR in the 130s.  Also hypertensive with SBP in 170s and DBP in 130s.  UDS negative.  Mildly elevated AST and ALT.BNP 2000.  High-sensitivity troponin 48> 50.  EKG revealed a flutter and LVH.  Lactic acid 2.1> 1.7.  Hgb 11.7.  UA with large LE and many bacteria.  POC COVID-19 test negative.  Cardiology consulted.  Patient admitted for atrial flutter with RVR and unknown type acute CHF.   D-dimer elevated.  CTA chest negative for PE.  Echo with EF of 10 to 15%, global hypokinesis, unknown diastolic function, severely reduced RVEF and severely enlarged RV with RVSP of 28.2 mmHg.   Patient underwent TEE guided cardioversion on 05/06/2019 and converted to NSR. TEE with EF of 10%, moderate MR, no thrombus or significant ASD or PFO.   Advanced heart failure team involved.  Started high-dose IV Lasix but renal function gotten worse.   Subjective: A/O x4, negative CP, negative S OB.   Assessment & Plan:   Active Problems:   Atrial flutter with rapid ventricular response (HCC)   Acute heart failure (HCC)   Essential hypertension   Acute on chronic combined systolic and diastolic CHF (congestive heart failure) (HCC)  Acute systolic CHF -  Acute systolic CHF with severely reduced RVEF: BNP 2000.  LVH on EKG.  CXR with cardiomegaly and small right pleural effusion. CTA negative for PE but bilateral pleural effusion. Cr 1.19 (admit)> 1.42> 1.52> 2.58> 2.18 -Advanced HF team managing-diuretics on hold due to renal function. -Was on milrinone gtt, BiDil and Aldactone.  BiDil  and milrinone stopped by cardiology  on 05/10/2019.  Started on [place]. -Monitor fluid status, renal function and lytes.  Persistent atrial flutter with RVR: RVR resolved.  CHA2DS2-VASc score of 2.  TTE and TEE as below.  -2/25-successful TEE/DCCV on 2/25. Converted to NSR but then flipped back into atrial fibrillation on the afternoon of 05/08/2019.  Cardiology managing.  Amiodarone increased.  Digoxin added.  Aldactone increased.  Remains on heparin drip.  Plan for repeat DCCV cardioversion once his acute encephalopathy is resolved.  Hopefully tomorrow by cardiology.  Acute metabolic encephalopathy/alcohol withdrawal: Reportedly has history of DTs in the past.  Patient started having alcohol withdrawal since the evening of 05/08/2019.  Has received almost 16 mg of Ativan in those 24 hours.  He was started on Librium 2 days ago.  He is feeling much better.  For the first time, he is alert and oriented x2.  Wanted to get off of the mitten.  Will back off on Librium to twice daily from 3 times daily and continue CIWA protocol.    Acute kidney injury/azotemia: Baseline Cr ~1.1>>1.19 (admit)> 1.42> 1.52> 2.58>2.18> 1.07> 0.84> 0.95.  resolved now.  Likely a combination of CHF/atrial flutter, diuretics and possibly contrast from CTA -Diuretics on hold.  -Continue monitoring  Elevated d-dimer: CTA negative for PE but moderate bilateral pleural effusions, right > left.   Elevated troponin: Likely demand ischemia from a flutter and CHF.  TTE and TEE as above. -Cardiology following  Elevated  liver enzymes: Likely due to alcohol, CHF and possibly due to amiodarone.  CK within normal.  Acute hepatitis panel and HIV negative. -Manage CHF and EtOH withdrawal as above as above  Hypokalemia hypomagnesemia/hypomagnesemia:  -3.4 potassium.  Replace.  Magnesium normal.  E. coli UTI: Patient have symptoms although chronic.  Urine culture with pansensitive E. coli. -CTX 2/23-2/27 followed by amoxicillin  which he completed the course.  Essential hypertension: Slightly elevated blood pressure.  Aldactone and amiodarone increased.  History of polysubstance use -Cessation counseling when able -Consult CSW for resources  Tobacco use disorder: Smokes about 1 pack a day. -Encourage cessation -Nicotine patch  Generalized weakness/debility -PT/OT eval  Euthyroid sick syndrome: TSH 5.4.  Free T4 1.68 -Repeat thyroid panel in 4 to 6 weeks outpatient.  Anion gap metabolic acidosis: Due to AKI/azotemia?  Improved. -Continue monitoring.  Hypomagnesmia -Magnesium goal> 2 -Magnesium IV 3 g  Thrombopenia Results for MOHAMMEDALI, KORSAK (MRN [id number]) as of 05/12/2019 14:35  Ref. Range 05/09/2019 05:37 05/10/2019 05:25 05/11/2019 05:27 05/12/2019 05:19 05/12/2019 10:41  Platelets Latest Ref Range: 150 - 400 K/uL 112 (L) 108 (L) 101 (L) 48 (L) 35 (L)  -Avoid all heparin products   DVT prophylaxis: SCD Code Status: Full Family Communication:  Disposition Plan: TBD per cardiology   Consultants:  Cardiology  Procedures/Significant Events:      I have personally reviewed and interpreted all radiology studies and my findings are as above.  VENTILATOR SETTINGS:    Cultures   Antimicrobials: Anti-infectives (From admission, onward)   Start     Dose/Rate Stop   05/08/19 1400  amoxicillin (AMOXIL) capsule 500 mg     500 mg 05/09/19 2159   05/05/19 1900  cefTRIAXone (ROCEPHIN) 1 g in sodium chloride 0.9 % 100 mL IVPB  Status:  Discontinued     1 g 200 mL/hr over 30 Minutes 05/08/19 0751   05/04/19 1730  cefTRIAXone (ROCEPHIN) 1 g in sodium chloride 0.9 % 100 mL IVPB     1 g 200 mL/hr over 30 Minutes 05/04/19 2005       Devices    LINES / TUBES:      Continuous Infusions: . sodium chloride    . sodium chloride 10 mL/hr at 05/12/19 0700  . amiodarone 30 mg/hr (05/12/19 0700)  . bivalirudin (ANGIOMAX) infusion 0.5 mg/mL (Non-ACS indications) 0.1 mg/kg/hr (05/12/19 1313)      Objective: Vitals:   05/12/19 0805 05/12/19 1200 05/12/19 1300 05/12/19 1426  BP: (!) 159/107 (!) 120/91 105/75 91/69  Pulse: (!) 114 (!) 101 86 73  Resp:      Temp: 98.2 F (36.8 C)  (!) 97.5 F (36.4 C)   TempSrc: Axillary  Axillary   SpO2: 99% 99% 98% 99%  Weight:      Height:        Intake/Output Summary (Last 24 hours) at 05/12/2019 1435 Last data filed at 05/12/2019 1200 Gross per 24 hour  Intake 1141.04 ml  Output 1300 ml  Net -158.96 ml   Filed Weights   05/10/19 0403 05/11/19 0412 05/12/19 0535  Weight: 62.7 kg 63 kg 57.9 kg    Examination:  General: A/O x4, no acute respiratory distress, cachectic Eyes: negative scleral hemorrhage, negative anisocoria, negative icterus ENT: Negative Runny nose, negative gingival bleeding, Neck:  Negative scars, masses, torticollis, lymphadenopathy, JVD Lungs: Clear to auscultation bilaterally without wheezes or crackles Cardiovascular: Regular rate and rhythm without murmur gallop or rub normal S1 and S2 Abdomen: negative abdominal pain, nondistended,  positive soft, bowel sounds, no rebound, no ascites, no appreciable mass Extremities: No significant cyanosis, clubbing, or edema bilateral lower extremities Skin: Negative rashes, lesions, ulcers Psychiatric:  Negative depression, negative anxiety, negative fatigue, negative mania  Central nervous system:  Cranial nerves II through XII intact, tongue/uvula midline, all extremities muscle strength 5/5, sensation intact throughout, negative dysarthria, negative expressive aphasia, negative receptive aphasia.  .     Data Reviewed: Care during the described time interval was provided by me .  I have reviewed this patient's available data, including medical history, events of note, physical examination, and all test results as part of my evaluation.  CBC: Recent Labs  Lab 05/09/19 0537 05/10/19 0525 05/11/19 0527 05/12/19 0519 05/12/19 1041  WBC 7.4 6.3 5.9 6.6 5.6  HGB  11.1* 11.9* 12.4* 13.1 12.1*  HCT 33.1* 35.3* 36.6* 38.4* 36.6*  MCV 93.0 91.9 91.0 90.4 92.0  PLT 112* 108* 101* 48* 35*   Basic Metabolic Panel: Recent Labs  Lab 05/07/19 0409 05/07/19 0409 05/07/19 1253 05/07/19 1357 05/08/19 0527 05/09/19 0537 05/10/19 0525 05/11/19 0527 05/12/19 0519  NA 136   < > 139   < > 136 137 135 137 135  K 6.2*   < > 5.0   < > 3.1* 3.4* 3.4* 3.6 3.9  CL 101   < > 103   < > 102 105 101 105 101  CO2 15*   < > 16*   < > 20* 22 22 23 23   GLUCOSE 114*   < > 122*   < > 143* 143* 209* 102* 132*  BUN 33*   < > 37*   < > 41* 18 9 8 6   CREATININE 2.41*   < > 2.55*   < > 2.18* 1.07 0.84 0.95 0.88  CALCIUM 8.9   < > 8.6*   < > 8.0* 8.3* 8.3* 8.3* 8.6*  MG 2.5*   < >  --   --  2.0 1.7 1.7 1.6* 1.5*  PHOS 7.3*  --  7.2*  --  4.3 2.3* 2.7  --   --    < > = values in this interval not displayed.   GFR: Estimated Creatinine Clearance: 74 mL/min (by C-G formula based on SCr of 0.88 mg/dL). Liver Function Tests: Recent Labs  Lab 05/07/19 0409 05/07/19 1253 05/08/19 0527 05/09/19 0537 05/10/19 0525  AST 170*  --   --   --   --   ALT 107*  --   --   --   --   ALKPHOS 108  --   --   --   --   BILITOT 0.9  --   --   --   --   PROT 6.7  --   --   --   --   ALBUMIN 3.5  3.6 3.3* 3.0* 2.9* 2.8*   No results for input(s): LIPASE, AMYLASE in the last 168 hours. Recent Labs  Lab 05/07/19 1206 05/08/19 0527 05/10/19 2119  AMMONIA 60* 28 29   Coagulation Profile: Recent Labs  Lab 05/06/19 0549  INR 1.5*   Cardiac Enzymes: No results for input(s): CKTOTAL, CKMB, CKMBINDEX, TROPONINI in the last 168 hours. BNP (last 3 results) No results for input(s): PROBNP in the last 8760 hours. HbA1C: No results for input(s): HGBA1C in the last 72 hours. CBG: Recent Labs  Lab 05/05/19 1810 05/05/19 2326 05/06/19 2041 05/10/19 2149  GLUCAP 103* 122* 111* 103*   Lipid Profile: No results for input(s):  CHOL, HDL, LDLCALC, TRIG, CHOLHDL, LDLDIRECT in the last  72 hours. Thyroid Function Tests: No results for input(s): TSH, T4TOTAL, FREET4, T3FREE, THYROIDAB in the last 72 hours. Anemia Panel: No results for input(s): VITAMINB12, FOLATE, FERRITIN, TIBC, IRON, RETICCTPCT in the last 72 hours. Sepsis Labs: No results for input(s): PROCALCITON, LATICACIDVEN in the last 168 hours.  Recent Results (from the past 240 hour(s))  Urine culture     Status: Abnormal   Collection Time: 05/04/19  3:47 PM   Specimen: Urine, Clean Catch  Result Value Ref Range Status   Specimen Description URINE, CLEAN CATCH  Final   Special Requests   Final    NONE Performed at [facility], [address], [place], [place] [address]    Culture >=100,000 COLONIES/mL ESCHERICHIA COLI (A)  Final   Report Status 05/06/2019 FINAL  Final   Organism ID, Bacteria ESCHERICHIA COLI (A)  Final      Susceptibility   Escherichia coli - MIC*    AMPICILLIN 8 SENSITIVE Sensitive     CEFAZOLIN <=4 SENSITIVE Sensitive     CEFTRIAXONE <=0.25 SENSITIVE Sensitive     CIPROFLOXACIN <=0.25 SENSITIVE Sensitive     GENTAMICIN <=1 SENSITIVE Sensitive     IMIPENEM <=0.25 SENSITIVE Sensitive     NITROFURANTOIN <=16 SENSITIVE Sensitive     TRIMETH/SULFA <=20 SENSITIVE Sensitive     AMPICILLIN/SULBACTAM 4 SENSITIVE Sensitive     PIP/TAZO <=4 SENSITIVE Sensitive     * >=100,000 COLONIES/mL ESCHERICHIA COLI  SARS CORONAVIRUS 2 (TAT 6-24 HRS) Nasopharyngeal Nasopharyngeal Swab     Status: None   Collection Time: 05/05/19  8:06 AM   Specimen: Nasopharyngeal Swab  Result Value Ref Range Status   SARS Coronavirus 2 NEGATIVE NEGATIVE Final    Comment: (NOTE) SARS-CoV-2 target nucleic acids are NOT DETECTED. The SARS-CoV-2 RNA is generally detectable in upper and lower respiratory specimens during the acute phase of infection. Negative results do not preclude SARS-CoV-2 infection, do not rule out co-infections with other pathogens, and should not be used as the sole basis for treatment or  other patient management decisions. Negative results must be combined with clinical observations, patient history, and epidemiological information. The expected result is Negative. Fact Sheet for Patients: SugarRoll.be Fact Sheet for Healthcare Providers: https://www.-mathews.com/ This test is not yet approved or cleared by the [place] FDA and  has been authorized for detection and/or diagnosis of SARS-CoV-2 by FDA under an Emergency Use Authorization (EUA). This EUA will remain  in effect (meaning this test can be used) for the duration of the COVID-19 declaration under Section 56 4(b)(1) of the Act, 21 U.S.C. section 360bbb-3(b)(1), unless the authorization is terminated or revoked sooner. Performed at [facility], [place] [address] [place], [place] [address]          Radiology Studies: No results found.      Scheduled Meds: . carvedilol  6.25 mg Oral BID WC  . chlordiazePOXIDE  25 mg Oral BID  . Chlorhexidine Gluconate Cloth  6 each Topical Daily  . digoxin  0.125 mg Oral Daily  . feeding supplement (ENSURE ENLIVE)  237 mL Oral 4x daily  . folic acid  1 mg Oral Daily  . isosorbide-hydrALAZINE  2 tablet Oral TID  . lactulose  20 g Oral BID   Or  . lactulose  300 mL Rectal BID  . multivitamin with minerals  1 tablet Oral Daily  . nicotine  21 mg Transdermal Daily  . sacubitril-valsartan  1 tablet Oral BID  . sodium chloride flush  10-40 mL Intracatheter Q12H  . sodium chloride flush  3 mL Intravenous Q12H  . sodium chloride flush  3 mL Intravenous Q12H  . spironolactone  25 mg Oral Daily  . thiamine  100 mg Oral Daily   Or  . thiamine  100 mg Intravenous Daily   Continuous Infusions: . sodium chloride    . sodium chloride 10 mL/hr at 05/12/19 0700  . amiodarone 30 mg/hr (05/12/19 0700)  . bivalirudin (ANGIOMAX) infusion 0.5 mg/mL (Non-ACS indications) 0.1 mg/kg/hr (05/12/19 1313)     LOS: 8 days     Time spent:40 min    Jennie Hannay, Geraldo Docker, MD [facility] Pager [phone]  If 7PM-7AM, please contact night-coverage [email or web address] Password [facility] 05/12/2019, 2:35 PM

## 2019-05-12 NOTE — Progress Notes (Signed)
Initial Nutrition Assessment  **RD working remotely**  DOCUMENTATION CODES:   Not applicable  INTERVENTION:  Monitor for bowel movement; pt has not had one documented in 6 days.    Ensure Enlive po QID, each supplement provides 350 kcal and 20 grams of protein  Continue MVI daily  NUTRITION DIAGNOSIS:   Inadequate oral intake related to lethargy/confusion as evidenced by meal completion < 50%.    GOAL:   Patient will meet greater than or equal to 90% of their needs    MONITOR:   PO intake, Supplement acceptance, Weight trends, Labs, I & O's  REASON FOR ASSESSMENT:   Consult Assessment of nutrition requirement/status  ASSESSMENT:   Pt with a PMH significant for tobacco, EtOH and polysubstance abuse, bipolar disorder, anxiety and depression presented with progressive dyspnea and fatigue. Pt admitted for atrial fluter with RVR and acute CHF  Pt on CIWA protocol.   RD unable to reach pt via phone. Discussed pt with RN.   Of note, no bowel movement has been documented since 05/08/19. Sent secure chat to MD regarding bowel regimen.   PO Intake: 0-100% x 6 recorded meals (33% average intake)  Question accuracy of today's weight reading (57.9 kg) as all previous readings from this admission range from 60.6-63 kg. No previous wt hx since 2019 when pt weighed 61.2 kg. If today's wt is accurate (57.9 kg), pt noted to have a 5% wt loss since 08/09/2017, which is not significant for time frame.   Medications reviewed and include: Folvite, Chronulac, MVI, Aldactone, Thiamine, Magnesium Sulfate   Labs reviewed: Mg 1.5 (L)  UOP: 1,756ml x24 hours I/O: +2,363.15ml since admit   NUTRITION - FOCUSED PHYSICAL EXAM:  RD unable to perform at this time.    Diet Order:   Diet Order            Diet 2 gram sodium Room service appropriate? No; Fluid consistency: Thin; Fluid restriction: 1500 mL Fluid  Diet effective now              EDUCATION NEEDS:   No education needs  have been identified at this time  Skin:  Skin Assessment: Reviewed RN Assessment  Last BM:  05/08/19  Height:   Ht Readings from Last 1 Encounters:  05/06/19 5\' 9"  (1.753 m)    Weight:   Wt Readings from Last 1 Encounters:  05/12/19 57.9 kg     BMI:  Body mass index is 18.85 kg/m.  Estimated Nutritional Needs:   Kcal:  1900-2100  Protein:  95-110 grams  Fluid:  >1.9L/d or per MD   Larkin Ina, MS, RD, LDN RD pager number and weekend/on-call pager number located in [place].

## 2019-05-12 NOTE — Progress Notes (Signed)
Paged by nursing staff for hypotension.   Earlier today carvedilol was increased to 6.25mg  twice a day and as entresto  was increased to 49-51mg  twice a day.  SBP has dropped to 91/69   Hold afternoon bidil.BP parameters set for entresto/carvedilol/bidil.   Austin Brailsford NP-C 2:47 PM

## 2019-05-12 NOTE — Care Management (Signed)
05-12-19 [id number] Case Manager submitted benefits check for Entresto. Case Manager will follow for cost. Graves-Bigelow, Ocie Cornfield, RN,BSN Case Manager

## 2019-05-12 NOTE — Progress Notes (Addendum)
[place] for Heparin >Bivalirudin Indication: atrial fibrillation  Allergies  Allergen Reactions  . Heparin     R/o HIT 3/3    Patient Measurements: Height: 5\' 9"  (175.3 cm) Weight: 127 lb 10.3 oz (57.9 kg) IBW/kg (Calculated) : 70.7   Vital Signs: Temp: 98 F (36.7 C) (03/03 1526) Temp Source: Axillary (03/03 1526) BP: 95/73 (03/03 1826) Pulse Rate: 96 (03/03 1805)  Labs: Recent Labs    05/10/19 0525 05/10/19 0525 05/11/19 0527 05/11/19 0527 05/12/19 0519 05/12/19 1041 05/12/19 1806  HGB 11.9*   < > 12.4*   < > 13.1 12.1*  --   HCT 35.3*   < > 36.6*  --  38.4* 36.6*  --   PLT 108*   < > 101*  --  48* 35*  --   APTT 64*  --  72*  --   --   --  72*  HEPARINUNFRC 0.49  --  0.33  --  0.38  --   --   CREATININE 0.84  --  0.95  --  0.88  --   --    < > = values in this interval not displayed.    Estimated Creatinine Clearance: 74 mL/min (by C-G formula based on SCr of 0.88 mg/dL).  Assessment: 59 year old M w/ new aFlutter (CHADS2VASc = 0-1 for possible HTN), started on apixaban 2/24 and 2/25 transitioned back to heparin for cath. Apixaban last dose 2/25 9:30. Will follow APTT until correlates with HL. 2/25 TEE/DCCV converted patient to NSR. 2/27 back in aflutter briefly then sinus tachycardia.   Platelet count dropped overnight 101>48 rechecked this am resulted 35 - will stop heparin - check for HIT and begin bivalirudin drip for anticoagulation  aPTT ~5 hrs after starting bivalirudin was 72 sec, which is within the goal range for this pt. H/H 12.1/36.6, last platelet count 35. Per RN, no issues with IV or bleeding observed.  Goal of Therapy:  APTT 50-80 sec Monitor platelets by anticoagulation protocol: Yes   Plan:  Continue bivalirudin at 0.1 mg/kg/hr  Check confirmatory aPTT in ~2 hrs Monitor daily aPTT, CBC, platelets Monitor for signs/symptoms of bleeding F/U HIT antibody  Gillermina Hu, PharmD, BCPS, [facility] Clinical  Pharmacist 05/12/2019 6:49 PM   Addendum: Confirmatory aPTT drawn ~3 hrs after initial therapeutic aPTT was 66 sec, which is within the goal range for this pt. Continue bivalirudin infusion at 0.1 mg/kg/hr, check daily aPTT.

## 2019-05-12 NOTE — Progress Notes (Signed)
Patient complaining of 6/10 lower back pain.  No PRN ordered for pain, [facility] text paged.

## 2019-05-12 NOTE — Progress Notes (Addendum)
Patient's BP has been consistently elevated during this admit, but BP has been trending down during this shift and is currently 91/69 (MAP 78). Patient is lethargic at times with intermittent periods of agitation at others which has been his baseline. Patient is otherwise asymptomatic.  Notified Dr. Sherral Hammers via text page and sent text page to CHF team pager.  Received call back from Dr. Sherral Hammers & Darrick Grinder, NP w/ CHF team. Dr. Sherral Hammers states goal is MAP >60. Per Darrick Grinder NP, hold afternoon bidil dose; hold parameters will be placed on other meds. Will continue to monitor.

## 2019-05-13 DIAGNOSIS — F1910 Other psychoactive substance abuse, uncomplicated: Secondary | ICD-10-CM

## 2019-05-13 DIAGNOSIS — I248 Other forms of acute ischemic heart disease: Secondary | ICD-10-CM

## 2019-05-13 DIAGNOSIS — J90 Pleural effusion, not elsewhere classified: Secondary | ICD-10-CM

## 2019-05-13 DIAGNOSIS — F10231 Alcohol dependence with withdrawal delirium: Secondary | ICD-10-CM

## 2019-05-13 LAB — CBC
HCT: 39 % (ref 39.0–52.0)
HCT: 35.3 % — ABNORMAL LOW (ref 39.0–52.0)
Hemoglobin: 13 g/dL (ref 13.0–17.0)
Hemoglobin: 11.9 g/dL — ABNORMAL LOW (ref 13.0–17.0)
MCH: 30.8 pg (ref 26.0–34.0)
MCH: 30.7 pg (ref 26.0–34.0)
MCHC: 33.3 g/dL (ref 30.0–36.0)
MCHC: 33.7 g/dL (ref 30.0–36.0)
MCV: 92.4 fL (ref 80.0–100.0)
MCV: 91.2 fL (ref 80.0–100.0)
Platelets: 23 10*3/uL — CL (ref 150–400)
Platelets: 21 10*3/uL — CL (ref 150–400)
RBC: 4.22 MIL/uL (ref 4.22–5.81)
RBC: 3.87 MIL/uL — ABNORMAL LOW (ref 4.22–5.81)
RDW: 15 % (ref 11.5–15.5)
RDW: 15.2 % (ref 11.5–15.5)
WBC: 6.2 10*3/uL (ref 4.0–10.5)
WBC: 6.4 10*3/uL (ref 4.0–10.5)
nRBC: 0 % (ref 0.0–0.2)
nRBC: 0 % (ref 0.0–0.2)

## 2019-05-13 LAB — COMPREHENSIVE METABOLIC PANEL
ALT: 650 U/L — ABNORMAL HIGH (ref 0–44)
AST: 111 U/L — ABNORMAL HIGH (ref 15–41)
Albumin: 2.7 g/dL — ABNORMAL LOW (ref 3.5–5.0)
Alkaline Phosphatase: 76 U/L (ref 38–126)
Anion gap: 7 (ref 5–15)
BUN: 9 mg/dL (ref 6–20)
CO2: 24 mmol/L (ref 22–32)
Calcium: 8.2 mg/dL — ABNORMAL LOW (ref 8.9–10.3)
Chloride: 104 mmol/L (ref 98–111)
Creatinine, Ser: 1.05 mg/dL (ref 0.61–1.24)
GFR calc Af Amer: >60 mL/min (ref >60)
GFR calc non Af Amer: >60 mL/min (ref >60)
Glucose, Bld: 89 mg/dL (ref 70–99)
Potassium: 4 mmol/L (ref 3.5–5.1)
Sodium: 135 mmol/L (ref 135–145)
Total Bilirubin: 2.6 mg/dL — ABNORMAL HIGH (ref 0.3–1.2)
Total Protein: 5.6 g/dL — ABNORMAL LOW (ref 6.5–8.1)

## 2019-05-13 LAB — APTT: aPTT: 78 seconds — ABNORMAL HIGH (ref 24–36)

## 2019-05-13 LAB — MAGNESIUM: Magnesium: 2 mg/dL (ref 1.7–2.4)

## 2019-05-13 LAB — COOXEMETRY PANEL
Carboxyhemoglobin: 2 % — ABNORMAL HIGH (ref 0.5–1.5)
Methemoglobin: 0.5 % (ref 0.0–1.5)
O2 Saturation: 69.2 %
Total hemoglobin: 13.1 g/dL (ref 12.0–16.0)

## 2019-05-13 LAB — HEPARIN INDUCED PLATELET AB (HIT ANTIBODY): Heparin Induced Plt Ab: 0.58 OD — ABNORMAL HIGH (ref 0.000–0.400)

## 2019-05-13 LAB — PHOSPHORUS: Phosphorus: 3.7 mg/dL (ref 2.5–4.6)

## 2019-05-13 MED ORDER — LORAZEPAM 1 MG PO TABS
0.0000 mg | ORAL_TABLET | Freq: Four times a day (QID) | ORAL | Status: DC
  Filled 2019-05-13: qty 1

## 2019-05-13 MED ORDER — CARVEDILOL 3.125 MG PO TABS: 3.1250 mg | ORAL_TABLET | Freq: Two times a day (BID) | ORAL | Status: DC

## 2019-05-13 MED ORDER — SODIUM CHLORIDE 0.9 % IV SOLN
0.1100 mg/kg/h | INTRAVENOUS | Status: DC
  Administered 2019-05-13: 0.1 mg/kg/h via INTRAVENOUS
  Administered 2019-05-15: 0.11 mg/kg/h via INTRAVENOUS
  Filled 2019-05-13 (×5): qty 250

## 2019-05-13 MED ORDER — LORAZEPAM 1 MG PO TABS: 0.0000 mg | ORAL_TABLET | Freq: Two times a day (BID) | ORAL | Status: DC

## 2019-05-13 NOTE — Progress Notes (Signed)
Patients BP dropped to 72/59 (MAP 65). Confirmed with manual BP cuff. Patient is awake, alert, and asymptomatic. Notified CHF team; received call back from Darrick Grinder NP who states adjustments will be made to patient's cardiac meds. Will continue to monitor patient closely.

## 2019-05-13 NOTE — Progress Notes (Signed)
PROGRESS NOTE    Austin Cobb  [id number] DOB: 1960/03/25 DOA: 05/04/2019 PCP: Patient, No Pcp Per     Brief Narrative:  59 year old BM PMHx Bipolar disorder, Anxiety and Depression tobacco, EtOH and polysubstance abuse,    Presenting with progressive dyspnea for 1 day and exertional dyspnea and fatigue for over 2 months.  Also had paroxysmal nocturnal dyspnea.   In ED, found to be in A.  Flutter with RVR with HR in the 130s.  Also hypertensive with SBP in 170s and DBP in 130s.  UDS negative.  Mildly elevated AST and ALT.BNP 2000.  High-sensitivity troponin 48> 50.  EKG revealed a flutter and LVH.  Lactic acid 2.1> 1.7.  Hgb 11.7.  UA with large LE and many bacteria.  POC COVID-19 test negative.  Cardiology consulted.  Patient admitted for atrial flutter with RVR and unknown type acute CHF.   D-dimer elevated.  CTA chest negative for PE.  Echo with EF of 10 to 15%, global hypokinesis, unknown diastolic function, severely reduced RVEF and severely enlarged RV with RVSP of 28.2 mmHg.   Patient underwent TEE guided cardioversion on 05/06/2019 and converted to NSR. TEE with EF of 10%, moderate MR, no thrombus or significant ASD or PFO.   Advanced heart failure team involved.  Started high-dose IV Lasix but renal function gotten worse.   Subjective: 3/4 A/O x4, negative CP, negative S OB  Assessment & Plan:   Active Problems:   Atrial flutter with rapid ventricular response (HCC)   Acute heart failure (HCC)   Essential hypertension   Acute on chronic combined systolic and diastolic CHF (congestive heart failure) (HCC)   Alcohol withdrawal (HCC)   Acute metabolic encephalopathy   AKI (acute kidney injury) ([place])   Elevated d-dimer   Demand ischemia (HCC)   Elevated troponin   Pleural effusion   Polysubstance abuse (HCC)   E. coli UTI  Acute systolic CHF -Strict in and out -Daily weight -Cardiology consulted -Amiodarone drip per cardiology -Digoxin 0.125 mg  daily -Hydralazine PRN -Aldactone -Milrinone drip per cardiology -Metoprolol 2.5 mg PRN  A flutter with RVR - CHA2DS2-VASc score of 2. -Converted to NSR but returned to A. fib on 2/27 -See CHF -Repeat DCCV once acute encephalopathy resolved -Bivalirudin secondary to being HIT positive  EtOH withdrawal/Acute Metabolic Encephalopathy -On admission did not obtain alcohol level -Urine tox screen negative -CIWA protocol.  NOTE Ativan substituted for Librium secondary to increasing thrombocytopenia -3/4 appears to be improving, mittens now off  Acute kidney injury (baseline Cr 1.1) Recent Labs  Lab 05/10/19 0525 05/11/19 0527 05/12/19 0519 05/13/19 0452 05/14/19 0442  CREATININE 0.84 0.95 0.88 1.05 1.28*  -Baseline -Hold diuretics -Hold nephrotoxic medication -Monitor closely  Elevated D-dimer Results for Austin, Cobb (MRN [id number]) as of 05/14/2019 07:18  Ref. Range 05/12/2016 11:04 05/04/2019 21:44  D-Dimer, America Brown Latest Ref Range: 0.00 - 0.50 ug/mL-FEU 1.33 (H) 1.54 (H)  -Trend -CTA negative for PE, moderate bilateral pleural effusion see below  Elevated troponin/Demand ischemia Results for Austin, Cobb (MRN [id number]) as of 05/14/2019 07:18  Ref. Range 05/04/2019 15:40 05/04/2019 17:19  Troponin I (High Sensitivity) Latest Ref Range: <18 ng/L 48 (H) 50 (H)  -Most likely demand ischemia from a flutter and CHF  Essential HTN -See CHF  Elevated liver enzymes  -Most likely secondary to EtOH abuse, CHF, and Amiodarone -Elevated liver enzymes: Likely due to alcohol, CHF and possibly due to amiodarone.  CK  within normal range -.  Acute  hepatitis panel and HIV negative.  Bilateral pleural effusion -3/5 PCXR pending  UTI positive E. coli -Appears to have completed course of antibiotics -3/4 repeat urine culture pending   Polysubstance abuse -2/23 urine tox screen negative -Cessation counseling when able -Consult CSW for resources  Tobacco  abuse -Smokes 1 PPD -Cessation encouraged. -Nicotine patch   Generalized weakness/debility -PT/OT eval  Euthyroid sick syndrome:  -TSH 5.4.  Free T4 1.68 -Repeat thyroid panel in 4 to 6 weeks outpatient.  Hypomagnesmia -Magnesium goal> 2 -Magnesium IV 3 g  Thrombopenia Results for Austin, Cobb (MRN [id number]) as of 05/12/2019 14:35  Ref. Range 05/09/2019 05:37 05/10/2019 05:25 05/11/2019 05:27 05/12/2019 05:19 05/12/2019 10:41  Platelets Latest Ref Range: 150 - 400 K/uL 112 (L) 108 (L) 101 (L) 48 (L) 35 (L)  -Avoid all heparin products -Positive HIT antibody positive   DVT prophylaxis: Bivalirudin  Code Status: Full Family Communication:  Disposition Plan: TBD per cardiology   Consultants:  Cardiology   Procedures/Significant Events:  2/24 CTA chest PE protocol; similar: Negative for PE-moderate bilateral pleural effusions RIGHT>>> LEFT    I have personally reviewed and interpreted all radiology studies and my findings are as above.  VENTILATOR SETTINGS:    Cultures 2/23 HIV negative 2/23 urine positive E. coli 2/24 SARS coronavirus 2 negative 2/24 acute hepatitis panel negative 3/4 urine pending     Antimicrobials: Anti-infectives (From admission, onward)   Start     Dose/Rate Stop   05/08/19 1400  amoxicillin (AMOXIL) capsule 500 mg     500 mg 05/09/19 2159   05/05/19 1900  cefTRIAXone (ROCEPHIN) 1 g in sodium chloride 0.9 % 100 mL IVPB  Status:  Discontinued     1 g 200 mL/hr over 30 Minutes 05/08/19 0751   05/04/19 1730  cefTRIAXone (ROCEPHIN) 1 g in sodium chloride 0.9 % 100 mL IVPB     1 g 200 mL/hr over 30 Minutes 05/04/19 2005       Devices    LINES / TUBES:      Continuous Infusions: . sodium chloride    . sodium chloride 10 mL/hr at 05/13/19 1514  . amiodarone 30 mg/hr (05/14/19 0548)  . bivalirudin (ANGIOMAX) infusion 0.5 mg/mL (Non-ACS indications) 0.1 mg/kg/hr (05/13/19 1514)     Objective: Vitals:   05/13/19 2150  05/14/19 0002 05/14/19 0434 05/14/19 0504  BP: 96/74 105/81 105/80   Pulse: 85 87 94 92  Resp:      Temp:  98.5 F (36.9 C)  99 F (37.2 C)  TempSrc:  Oral  Oral  SpO2:  100%  98%  Weight:    61.6 kg  Height:        Intake/Output Summary (Last 24 hours) at 05/14/2019 0751 Last data filed at 05/13/2019 1514 Gross per 24 hour  Intake 829.41 ml  Output 175 ml  Net 654.41 ml   Filed Weights   05/12/19 0535 05/13/19 0500 05/14/19 0504  Weight: 57.9 kg 57.9 kg 61.6 kg   Physical Exam:  General: A/O x4, somnolent but arousable, no acute respiratory distress, cachectic Eyes: negative scleral hemorrhage, negative anisocoria, negative icterus ENT: Negative Runny nose, negative gingival bleeding, Neck:  Negative scars, masses, torticollis, lymphadenopathy, JVD Lungs: Decreased breath sounds bilaterally without wheezes or crackles Cardiovascular: Regular rate and rhythm without murmur gallop or rub normal S1 and S2 Abdomen: negative abdominal pain, nondistended, positive soft, bowel sounds, no rebound, no ascites, no appreciable mass Extremities: No significant cyanosis, clubbing, or edema bilateral lower extremities  Skin: Negative rashes, lesions, ulcers Psychiatric:  Negative depression, negative anxiety, negative fatigue, negative mania  Central nervous system:  Cranial nerves II through XII intact, tongue/uvula midline, all extremities muscle strength 5/5, sensation intact throughout, negative dysarthria, negative expressive aphasia, negative receptive aphasia.  .     Data Reviewed: Care during the described time interval was provided by me .  I have reviewed this patient's available data, including medical history, events of note, physical examination, and all test results as part of my evaluation.  CBC: Recent Labs  Lab 05/12/19 0519 05/12/19 1041 05/13/19 0452 05/13/19 1142 05/14/19 0442  WBC 6.6 5.6 6.2 6.4 5.9  HGB 13.1 12.1* 13.0 11.9* 11.8*  HCT 38.4* 36.6* 39.0 35.3*  35.2*  MCV 90.4 92.0 92.4 91.2 91.4  PLT 48* 35* 23* 21* 21*   Basic Metabolic Panel: Recent Labs  Lab 05/08/19 0527 05/08/19 0527 05/09/19 0537 05/09/19 0537 05/10/19 0525 05/11/19 0527 05/12/19 0519 05/13/19 0452 05/14/19 0442  NA 136   < > 137   < > 135 137 135 135 139  K 3.1*   < > 3.4*   < > 3.4* 3.6 3.9 4.0 3.8  CL 102   < > 105   < > 101 105 101 104 103  CO2 20*   < > 22   < > 22 23 23 24 26   GLUCOSE 143*   < > 143*   < > 209* 102* 132* 89 136*  BUN 41*   < > 18   < > 9 8 6 9 15   CREATININE 2.18*   < > 1.07   < > 0.84 0.95 0.88 1.05 1.28*  CALCIUM 8.0*   < > 8.3*   < > 8.3* 8.3* 8.6* 8.2* 8.4*  MG 2.0   < > 1.7   < > 1.7 1.6* 1.5* 2.0 1.9  PHOS 4.3  --  2.3*  --  2.7  --   --  3.7 4.4   < > = values in this interval not displayed.   GFR: Estimated Creatinine Clearance: 54.1 mL/min (A) (by C-G formula based on SCr of 1.28 mg/dL (H)). Liver Function Tests: Recent Labs  Lab 05/08/19 0527 05/09/19 0537 05/10/19 0525 05/13/19 0452 05/14/19 0442  AST  --   --   --  111* 73*  ALT  --   --   --  650* 479*  ALKPHOS  --   --   --  76 75  BILITOT  --   --   --  2.6* 2.0*  PROT  --   --   --  5.6* 5.6*  ALBUMIN 3.0* 2.9* 2.8* 2.7* 2.7*   No results for input(s): LIPASE, AMYLASE in the last 168 hours. Recent Labs  Lab 05/07/19 1206 05/08/19 0527 05/10/19 2119  AMMONIA 60* 28 29   Coagulation Profile: No results for input(s): INR, PROTIME in the last 168 hours. Cardiac Enzymes: No results for input(s): CKTOTAL, CKMB, CKMBINDEX, TROPONINI in the last 168 hours. BNP (last 3 results) No results for input(s): PROBNP in the last 8760 hours. HbA1C: No results for input(s): HGBA1C in the last 72 hours. CBG: Recent Labs  Lab 05/10/19 2149  GLUCAP 103*   Lipid Profile: No results for input(s): CHOL, HDL, LDLCALC, TRIG, CHOLHDL, LDLDIRECT in the last 72 hours. Thyroid Function Tests: No results for input(s): TSH, T4TOTAL, FREET4, T3FREE, THYROIDAB in the last 72  hours. Anemia Panel: No results for input(s): VITAMINB12, FOLATE, FERRITIN, TIBC, IRON, RETICCTPCT  in the last 72 hours. Sepsis Labs: No results for input(s): PROCALCITON, LATICACIDVEN in the last 168 hours.  Recent Results (from the past 240 hour(s))  Urine culture     Status: Abnormal   Collection Time: 05/04/19  3:47 PM   Specimen: Urine, Clean Catch  Result Value Ref Range Status   Specimen Description URINE, CLEAN CATCH  Final   Special Requests   Final    NONE Performed at [facility], [address] [place], [place] [address]    Culture >=100,000 COLONIES/mL ESCHERICHIA COLI (A)  Final   Report Status 05/06/2019 FINAL  Final   Organism ID, Bacteria ESCHERICHIA COLI (A)  Final      Susceptibility   Escherichia coli - MIC*    AMPICILLIN 8 SENSITIVE Sensitive     CEFAZOLIN <=4 SENSITIVE Sensitive     CEFTRIAXONE <=0.25 SENSITIVE Sensitive     CIPROFLOXACIN <=0.25 SENSITIVE Sensitive     GENTAMICIN <=1 SENSITIVE Sensitive     IMIPENEM <=0.25 SENSITIVE Sensitive     NITROFURANTOIN <=16 SENSITIVE Sensitive     TRIMETH/SULFA <=20 SENSITIVE Sensitive     AMPICILLIN/SULBACTAM 4 SENSITIVE Sensitive     PIP/TAZO <=4 SENSITIVE Sensitive     * >=100,000 COLONIES/mL ESCHERICHIA COLI  SARS CORONAVIRUS 2 (TAT 6-24 HRS) Nasopharyngeal Nasopharyngeal Swab     Status: None   Collection Time: 05/05/19  8:06 AM   Specimen: Nasopharyngeal Swab  Result Value Ref Range Status   SARS Coronavirus 2 NEGATIVE NEGATIVE Final    Comment: (NOTE) SARS-CoV-2 target nucleic acids are NOT DETECTED. The SARS-CoV-2 RNA is generally detectable in upper and lower respiratory specimens during the acute phase of infection. Negative results do not preclude SARS-CoV-2 infection, do not rule out co-infections with other pathogens, and should not be used as the sole basis for treatment or other patient management decisions. Negative results must be combined with clinical observations, patient history,  and epidemiological information. The expected result is Negative. Fact Sheet for Patients: SugarRoll.be Fact Sheet for Healthcare Providers: https://www.-mathews.com/ This test is not yet approved or cleared by the [place] FDA and  has been authorized for detection and/or diagnosis of SARS-CoV-2 by FDA under an Emergency Use Authorization (EUA). This EUA will remain  in effect (meaning this test can be used) for the duration of the COVID-19 declaration under Section 56 4(b)(1) of the Act, 21 U.S.C. section 360bbb-3(b)(1), unless the authorization is terminated or revoked sooner. Performed at [facility], [place] [address] [place], [place] [address]          Radiology Studies: No results found.      Scheduled Meds: . Chlorhexidine Gluconate Cloth  6 each Topical Daily  . digoxin  0.125 mg Oral Daily  . feeding supplement (ENSURE ENLIVE)  237 mL Oral 4x daily  . folic acid  1 mg Oral Daily  . lactulose  20 g Oral BID   Or  . lactulose  300 mL Rectal BID  . LORazepam  0-4 mg Oral Q6H   Followed by  . [START ON 05/15/2019] LORazepam  0-4 mg Oral Q12H  . multivitamin with minerals  1 tablet Oral Daily  . nicotine  21 mg Transdermal Daily  . sodium chloride flush  10-40 mL Intracatheter Q12H  . sodium chloride flush  3 mL Intravenous Q12H  . sodium chloride flush  3 mL Intravenous Q12H  . thiamine  100 mg Oral Daily   Or  . thiamine  100  mg Intravenous Daily   Continuous Infusions: . sodium chloride    . sodium chloride 10 mL/hr at 05/13/19 1514  . amiodarone 30 mg/hr (05/14/19 0548)  . bivalirudin (ANGIOMAX) infusion 0.5 mg/mL (Non-ACS indications) 0.1 mg/kg/hr (05/13/19 1514)     LOS: 10 days    Time spent:40 min    Peony Barner, Geraldo Docker, MD [facility] Pager [phone]  If 7PM-7AM, please contact night-coverage [email or web address] Password [facility] 05/14/2019, 7:51 AM

## 2019-05-13 NOTE — Progress Notes (Signed)
[place] for Heparin >Bivalirudin Indication: atrial fibrillation  Allergies  Allergen Reactions  . Heparin     R/o HIT 3/3    Patient Measurements: Height: 5\' 9"  (175.3 cm) Weight: 127 lb 10.3 oz (57.9 kg) IBW/kg (Calculated) : 70.7   Vital Signs: Temp: 98.1 F (36.7 C) (03/04 0825) Temp Source: Oral (03/04 0825) BP: 72/59 (03/04 1030) Pulse Rate: 99 (03/04 1030)  Labs: Recent Labs    05/11/19 0527 05/11/19 0527 05/12/19 0519 05/12/19 0519 05/12/19 1041 05/12/19 1806 05/12/19 2106 05/13/19 0452  HGB 12.4*   < > 13.1   < > 12.1*  --   --  13.0  HCT 36.6*   < > 38.4*  --  36.6*  --   --  39.0  PLT 101*   < > 48*  --  35*  --   --  23*  APTT 72*   < >  --   --   --  72* 66* 78*  HEPARINUNFRC 0.33  --  0.38  --   --   --   --   --   CREATININE 0.95  --  0.88  --   --   --   --  1.05   < > = values in this interval not displayed.    Estimated Creatinine Clearance: 62 mL/min (by C-G formula based on SCr of 1.05 mg/dL).  Assessment: 59 year old M w/ new aFlutter (CHADS2VASc = 0-1 for possible HTN), started on apixaban 2/24 and 2/25 transitioned back to heparin for cath. Apixaban last dose 2/25 9:30. Will follow APTT until correlates with HL. 2/25 TEE/DCCV converted patient to NSR. 2/27 back in aflutter briefly then sinus tachycardia.   Platelet count dropped overnight 101>48 >35>23 Stopped heparin- check for HIT and begin bivalirudin drip for anticoagulation  aptt 78sec at goal  on bivalirudin drip 0.1mg /kg/hr no bleeding noted, h/h ok   Goal of Therapy:  APTT 50-80 sec Monitor platelets by anticoagulation protocol: Yes   Plan:  Continue bivalirudin at 0.1 mg/kg/hr  Monitor daily aPTT, CBC, platelets Monitor for signs/symptoms of bleeding F/U HIT antibody   Bonnita Nasuti Pharm.D. CPP, BCPS Clinical Pharmacist [phone] 05/13/2019 10:51 AM

## 2019-05-13 NOTE — Progress Notes (Signed)
CRITICAL VALUE ALERT  Critical Value:  Platelets 23  Date & Time Notied:  05/13/2019 at 0603  Provider Notified: Sharlet Salina, on call with [facility]   Orders Received/Actions taken: none at this time

## 2019-05-13 NOTE — Progress Notes (Addendum)
Patient ID: GRZEGORZ HAMOR, male   DOB: 25-Jul-1960, 59 y.o.   MRN: [id number]     Advanced Heart Failure Rounding Note  PCP-Cardiologist: Donato Heinz, MD   Subjective:    Patient is awake and alert today, CVP 3.   BP had been very high (SBP > 150), now marked change and SBP 80s-100s.  Patient denies lightheadedness.   Remains in atrial flutter but rate lower in 80s.   Platelets have dropped from 100s range to 21. Off heparin gtt and started on bivalirudin.   Objective:   Weight Range: 57.9 kg Body mass index is 18.85 kg/m.   Vital Signs:   Temp:  [97.9 F (36.6 C)-98.1 F (36.7 C)] 98.1 F (36.7 C) (03/04 0825) Pulse Rate:  [73-109] 97 (03/04 1313) Resp:  [14-16] 16 (03/04 0423) BP: (72-116)/(50-91) 85/61 (03/04 1313) SpO2:  [95 %-100 %] 100 % (03/04 1313) Weight:  [57.9 kg] 57.9 kg (03/04 0500) Last BM Date: (unkown)  Weight change: Filed Weights   05/11/19 0412 05/12/19 0535 05/13/19 0500  Weight: 63 kg 57.9 kg 57.9 kg    Intake/Output:   Intake/Output Summary (Last 24 hours) at 05/13/2019 1338 Last data filed at 05/13/2019 0800 Gross per 24 hour  Intake 1720.49 ml  Output 500 ml  Net 1220.49 ml      Physical Exam    General: NAD Neck: No JVD, no thyromegaly or thyroid nodule.  Lungs: Clear to auscultation bilaterally with normal respiratory effort. CV: Nondisplaced PMI.  Heart regular S1/S2, no S3/S4, no murmur.  No peripheral edema.   Abdomen: Soft, nontender, no hepatosplenomegaly, no distention.  Skin: Intact without lesions or rashes.  Neurologic: Alert and oriented x 3.  Psych: Normal affect. Extremities: No clubbing or cyanosis.  HEENT: Normal.    Telemetry   Atrial flutter 80s (personally reviewed)  Labs    CBC Recent Labs    05/13/19 0452 05/13/19 1142  WBC 6.2 6.4  HGB 13.0 11.9*  HCT 39.0 35.3*  MCV 92.4 91.2  PLT 23* 21*   Basic Metabolic Panel Recent Labs    05/12/19 0519 05/13/19 0452  NA 135 135  K 3.9  4.0  CL 101 104  CO2 23 24  GLUCOSE 132* 89  BUN 6 9  CREATININE 0.88 1.05  CALCIUM 8.6* 8.2*  MG 1.5* 2.0  PHOS  --  3.7   Liver Function Tests Recent Labs    05/13/19 0452  AST 111*  ALT 650*  ALKPHOS 76  BILITOT 2.6*  PROT 5.6*  ALBUMIN 2.7*   No results for input(s): LIPASE, AMYLASE in the last 72 hours. Cardiac Enzymes No results for input(s): CKTOTAL, CKMB, CKMBINDEX, TROPONINI in the last 72 hours.  BNP: BNP (last 3 results) Recent Labs    05/04/19 1540  BNP 2,060.2*    ProBNP (last 3 results) No results for input(s): PROBNP in the last 8760 hours.   D-Dimer No results for input(s): DDIMER in the last 72 hours. Hemoglobin A1C No results for input(s): HGBA1C in the last 72 hours. Fasting Lipid Panel No results for input(s): CHOL, HDL, LDLCALC, TRIG, CHOLHDL, LDLDIRECT in the last 72 hours. Thyroid Function Tests No results for input(s): TSH, T4TOTAL, T3FREE, THYROIDAB in the last 72 hours.  Invalid input(s): FREET3  Other results:   Imaging    No results found.   Medications:     Scheduled Medications: . Chlorhexidine Gluconate Cloth  6 each Topical Daily  . digoxin  0.125 mg Oral Daily  .  feeding supplement (ENSURE ENLIVE)  237 mL Oral 4x daily  . folic acid  1 mg Oral Daily  . lactulose  20 g Oral BID   Or  . lactulose  300 mL Rectal BID  . LORazepam  0-4 mg Oral Q6H   Followed by  . [START ON 05/15/2019] LORazepam  0-4 mg Oral Q12H  . multivitamin with minerals  1 tablet Oral Daily  . nicotine  21 mg Transdermal Daily  . sodium chloride flush  10-40 mL Intracatheter Q12H  . sodium chloride flush  3 mL Intravenous Q12H  . sodium chloride flush  3 mL Intravenous Q12H  . thiamine  100 mg Oral Daily   Or  . thiamine  100 mg Intravenous Daily    Infusions: . sodium chloride    . sodium chloride 10 mL/hr at 05/13/19 0600  . amiodarone 30 mg/hr (05/13/19 0600)    PRN Medications: sodium chloride, hydrALAZINE, metoprolol tartrate,  sodium chloride flush, sodium chloride flush   Assessment/Plan   1. Altered mental status/delirium: Suspect ETOH withdrawal with significant rise in BP and history of heavy ETOH.  Also possible hepatic encephalopathy with elevated NH3.  This seems to have resolved.  2.  AKI: Suspect cardiorenal, possibly with low output. Contrast nephropathy from PE CT may also play a role.  This has resolved.  3. Acute systolic CHF: Echo this admission with EF 15%, severe RV dysfunction.  No prior cardiac history.  Etiology is uncertain => could be tachycardia-mediated CMP but exertional dyspnea pre-dated palpitations, could be due to long-standing heavy ETOH use, could be viral cardiomyopathy, cannot rule out CAD. He had DCCV but recurrence of atrial flutter on 2/27  and remains in flutter today, rates in the 80s now.  Co-ox markedly low initially, milrinone started.  He is now off milrinone with stable co-ox.  Creatinine improved.  CVP 3.  BP was markedly high for several days, now significant change and SBP running 80s-100s.  HR also lower (80s).  It is possible that this change is due to resolution of ETOH withdrawal/DTs.  - No diuretic.  - With low BP, will stop Bidil, Entresto, spironolactone, and Coreg.   - Continue digoxin 0.125 daily.  - Will send blood cultures given marked change in BP but think infection unlikely.  He is afebrile.  - Eventual right/left heart cath, need resolution of thrombocytopenia.  4. Atrial flutter: AFL with RVR at admission, uncertain how long.  He had felt palpitations for about a week.  Possible component of tachy-mediated CMP.  He is s/p TEE-guided DCCV to NSR but atrial flutter recurred on 2/27.  HR slower in 80s today, still flutter.  May be lower rate with resolution of withdrawal symptoms.  - Continue amiodarone gtt 30 mg/hr - Will need repeat DCCV, had planned to do after cath but now with falling platelets timing is uncertain.  - Consider ablation down the road, especially  if he can stop drinking.  - Was transitioned from heparin gtt to bivalirudin with falling platelets, now plts to 21 K and will hold bivaliruding for the time being.   5. ETOH abuse: Have strongly encouraged him to quit.  Rehab program would be helpful. Now with withdrawal as above, seems to have resolved.  6. Thrombocytopenia: Platelets have been in 100s range but dropped down to 21K today. He had been on heparin gtt, now on bivalirudin.  - With plts continuing to drop, stop bivalirudin.  Pending HIT antibody.  - Stop Librium as  there is some risk for thrombocytopenia from this.  - No other med is an obvious culprit.   Length of Stay: 9  Loralie Champagne, MD  05/13/2019, 1:38 PM  Advanced Heart Failure Team Pager [phone] (M-F; [place])  Please contact [facility] for night-coverage after hours (4p -7a ) and weekends on [email or web address]  HIT+, will keep bivalirudin going.   Loralie Champagne 05/13/2019 1:50 PM

## 2019-05-13 NOTE — Progress Notes (Signed)
SBP low   Discussed with Dr Aundra Dubin  Stop entresto and bidil.   Cut back carvedilol 3.125 mg twice a day.   Jabier Deese NP-C  10:35 AM

## 2019-05-14 ENCOUNTER — Encounter (HOSPITAL_COMMUNITY): Payer: Self-pay | Admitting: Internal Medicine

## 2019-05-14 ENCOUNTER — Inpatient Hospital Stay (HOSPITAL_COMMUNITY): Payer: Medicare PPO

## 2019-05-14 DIAGNOSIS — N390 Urinary tract infection, site not specified: Secondary | ICD-10-CM | POA: Diagnosis present

## 2019-05-14 DIAGNOSIS — Z9119 Patient's noncompliance with other medical treatment and regimen: Secondary | ICD-10-CM

## 2019-05-14 DIAGNOSIS — F10239 Alcohol dependence with withdrawal, unspecified: Secondary | ICD-10-CM | POA: Diagnosis present

## 2019-05-14 DIAGNOSIS — R778 Other specified abnormalities of plasma proteins: Secondary | ICD-10-CM | POA: Diagnosis present

## 2019-05-14 DIAGNOSIS — G9341 Metabolic encephalopathy: Secondary | ICD-10-CM | POA: Diagnosis present

## 2019-05-14 DIAGNOSIS — Z91199 Patient's noncompliance with other medical treatment and regimen due to unspecified reason: Secondary | ICD-10-CM

## 2019-05-14 DIAGNOSIS — J90 Pleural effusion, not elsewhere classified: Secondary | ICD-10-CM | POA: Diagnosis present

## 2019-05-14 DIAGNOSIS — N179 Acute kidney failure, unspecified: Secondary | ICD-10-CM | POA: Diagnosis present

## 2019-05-14 DIAGNOSIS — I248 Other forms of acute ischemic heart disease: Secondary | ICD-10-CM | POA: Diagnosis present

## 2019-05-14 DIAGNOSIS — B9620 Unspecified Escherichia coli [E. coli] as the cause of diseases classified elsewhere: Secondary | ICD-10-CM | POA: Diagnosis present

## 2019-05-14 DIAGNOSIS — F10939 Alcohol use, unspecified with withdrawal, unspecified: Secondary | ICD-10-CM | POA: Diagnosis present

## 2019-05-14 DIAGNOSIS — R451 Restlessness and agitation: Secondary | ICD-10-CM

## 2019-05-14 DIAGNOSIS — F1910 Other psychoactive substance abuse, uncomplicated: Secondary | ICD-10-CM | POA: Diagnosis present

## 2019-05-14 DIAGNOSIS — R7989 Other specified abnormal findings of blood chemistry: Secondary | ICD-10-CM | POA: Diagnosis present

## 2019-05-14 HISTORY — DX: Patient's noncompliance with other medical treatment and regimen due to unspecified reason: Z91.199

## 2019-05-14 HISTORY — DX: Patient's noncompliance with other medical treatment and regimen: Z91.19

## 2019-05-14 LAB — COOXEMETRY PANEL
Carboxyhemoglobin: 2 % — ABNORMAL HIGH (ref 0.5–1.5)
Methemoglobin: 0.5 % (ref 0.0–1.5)
O2 Saturation: 65.7 %
Total hemoglobin: 11.9 g/dL — ABNORMAL LOW (ref 12.0–16.0)

## 2019-05-14 LAB — CBC
HCT: 35.2 % — ABNORMAL LOW (ref 39.0–52.0)
Hemoglobin: 11.8 g/dL — ABNORMAL LOW (ref 13.0–17.0)
MCH: 30.6 pg (ref 26.0–34.0)
MCHC: 33.5 g/dL (ref 30.0–36.0)
MCV: 91.4 fL (ref 80.0–100.0)
Platelets: 21 10*3/uL — CL (ref 150–400)
RBC: 3.85 MIL/uL — ABNORMAL LOW (ref 4.22–5.81)
RDW: 15.1 % (ref 11.5–15.5)
WBC: 5.9 10*3/uL (ref 4.0–10.5)
nRBC: 0 % (ref 0.0–0.2)

## 2019-05-14 LAB — D-DIMER, QUANTITATIVE: D-Dimer, Quant: 0.81 ug/mL-FEU — ABNORMAL HIGH (ref 0.00–0.50)

## 2019-05-14 LAB — COMPREHENSIVE METABOLIC PANEL
ALT: 479 U/L — ABNORMAL HIGH (ref 0–44)
AST: 73 U/L — ABNORMAL HIGH (ref 15–41)
Albumin: 2.7 g/dL — ABNORMAL LOW (ref 3.5–5.0)
Alkaline Phosphatase: 75 U/L (ref 38–126)
Anion gap: 10 (ref 5–15)
BUN: 15 mg/dL (ref 6–20)
CO2: 26 mmol/L (ref 22–32)
Calcium: 8.4 mg/dL — ABNORMAL LOW (ref 8.9–10.3)
Chloride: 103 mmol/L (ref 98–111)
Creatinine, Ser: 1.28 mg/dL — ABNORMAL HIGH (ref 0.61–1.24)
GFR calc Af Amer: >60 mL/min (ref >60)
GFR calc non Af Amer: >60 mL/min (ref >60)
Glucose, Bld: 136 mg/dL — ABNORMAL HIGH (ref 70–99)
Potassium: 3.8 mmol/L (ref 3.5–5.1)
Sodium: 139 mmol/L (ref 135–145)
Total Bilirubin: 2 mg/dL — ABNORMAL HIGH (ref 0.3–1.2)
Total Protein: 5.6 g/dL — ABNORMAL LOW (ref 6.5–8.1)

## 2019-05-14 LAB — TYPE AND SCREEN
ABO/RH(D): A POS
Antibody Screen: NEGATIVE

## 2019-05-14 LAB — PHOSPHORUS: Phosphorus: 4.4 mg/dL (ref 2.5–4.6)

## 2019-05-14 LAB — APTT: aPTT: 67 seconds — ABNORMAL HIGH (ref 24–36)

## 2019-05-14 LAB — ABO/RH: ABO/RH(D): A POS

## 2019-05-14 LAB — MAGNESIUM: Magnesium: 1.9 mg/dL (ref 1.7–2.4)

## 2019-05-14 LAB — AMMONIA: Ammonia: 24 umol/L (ref 9–35)

## 2019-05-14 MED ORDER — HALOPERIDOL LACTATE 5 MG/ML IJ SOLN
1.0000 mg | Freq: Two times a day (BID) | INTRAMUSCULAR | Status: DC
  Administered 2019-05-14 – 2019-05-17 (×6): 1 mg via INTRAVENOUS
  Filled 2019-05-14 (×6): qty 1

## 2019-05-14 MED ORDER — LORAZEPAM 2 MG/ML IJ SOLN
2.0000 mg | Freq: Once | INTRAMUSCULAR | Status: AC
  Administered 2019-05-14: 2 mg via INTRAVENOUS

## 2019-05-14 MED ORDER — LORAZEPAM 2 MG/ML IJ SOLN
0.0000 mg | Freq: Three times a day (TID) | INTRAMUSCULAR | Status: AC
  Administered 2019-05-17 (×3): 2 mg via INTRAVENOUS
  Filled 2019-05-14 (×4): qty 1

## 2019-05-14 MED ORDER — LORAZEPAM 1 MG PO TABS: 1.0000 mg | ORAL_TABLET | ORAL | Status: AC | PRN

## 2019-05-14 MED ORDER — SODIUM CHLORIDE 0.9% IV SOLUTION: Freq: Once | INTRAVENOUS | Status: DC

## 2019-05-14 MED ORDER — SPIRONOLACTONE 12.5 MG HALF TABLET
12.5000 mg | ORAL_TABLET | Freq: Every day | ORAL | Status: DC
  Administered 2019-05-14 – 2019-05-16 (×3): 12.5 mg via ORAL
  Filled 2019-05-14 (×4): qty 1

## 2019-05-14 MED ORDER — LORAZEPAM 2 MG/ML IJ SOLN
1.0000 mg | INTRAMUSCULAR | Status: AC | PRN
  Administered 2019-05-14 – 2019-05-16 (×5): 2 mg via INTRAVENOUS
  Filled 2019-05-14 (×8): qty 1
  Filled 2019-05-14: qty 2

## 2019-05-14 MED ORDER — ADULT MULTIVITAMIN W/MINERALS CH
1.0000 | ORAL_TABLET | Freq: Every day | ORAL | Status: DC
  Administered 2019-05-14 – 2019-05-26 (×13): 1 via ORAL
  Filled 2019-05-14 (×13): qty 1

## 2019-05-14 MED ORDER — FOLIC ACID 5 MG/ML IJ SOLN
1.0000 mg | Freq: Every day | INTRAMUSCULAR | Status: DC
  Administered 2019-05-14 – 2019-05-22 (×8): 1 mg via INTRAVENOUS
  Filled 2019-05-14 (×11): qty 0.2

## 2019-05-14 MED ORDER — LORAZEPAM 2 MG/ML IJ SOLN
0.0000 mg | INTRAMUSCULAR | Status: AC
  Administered 2019-05-14: 18:00:00 1 mg via INTRAVENOUS
  Administered 2019-05-14 – 2019-05-15 (×2): 3 mg via INTRAVENOUS
  Administered 2019-05-16: 2 mg via INTRAVENOUS
  Filled 2019-05-14: qty 2
  Filled 2019-05-14 (×2): qty 1
  Filled 2019-05-14: qty 2

## 2019-05-14 MED ORDER — THIAMINE HCL 100 MG PO TABS: 100.0000 mg | ORAL_TABLET | Freq: Every day | ORAL | Status: DC

## 2019-05-14 MED ORDER — LORAZEPAM 2 MG/ML IJ SOLN
INTRAMUSCULAR | Status: AC
  Filled 2019-05-14: qty 1

## 2019-05-14 MED ORDER — THIAMINE HCL 100 MG/ML IJ SOLN: 100.0000 mg | Freq: Every day | INTRAMUSCULAR | Status: DC

## 2019-05-14 MED ORDER — FOLIC ACID 1 MG PO TABS: 1.0000 mg | ORAL_TABLET | Freq: Every day | ORAL | Status: DC

## 2019-05-14 MED ORDER — LOSARTAN POTASSIUM 25 MG PO TABS
12.5000 mg | ORAL_TABLET | Freq: Every day | ORAL | Status: DC
  Administered 2019-05-14: 12.5 mg via ORAL
  Filled 2019-05-14: qty 1

## 2019-05-14 NOTE — Care Management Important Message (Signed)
Important Message  Patient Details  Name: Austin Cobb MRN: [id number] Date of Birth: June 05, 1960   Medicare Important Message Given:  Yes     [name] 05/14/2019, 11:17 AM

## 2019-05-14 NOTE — Progress Notes (Signed)
[facility] for Heparin >Bivalirudin Indication: atrial fibrillation  Allergies  Allergen Reactions  . Heparin     R/o HIT 3/3    Patient Measurements: Height: 5\' 9"  (175.3 cm) Weight: 135 lb 12.9 oz (61.6 kg) IBW/kg (Calculated) : 70.7   Vital Signs: Temp: 97.9 F (36.6 C) (03/05 1124) Temp Source: Oral (03/05 1124) BP: 123/86 (03/05 1124) Pulse Rate: 105 (03/05 1124)  Labs: Recent Labs    05/12/19 0519 05/12/19 1041 05/12/19 2106 05/13/19 0452 05/13/19 0452 05/13/19 1142 05/14/19 0442  HGB 13.1   < >  --  13.0   < > 11.9* 11.8*  HCT 38.4*   < >  --  39.0  --  35.3* 35.2*  PLT 48*   < >  --  23*  --  21* 21*  APTT  --    < > 66* 78*  --   --  67*  HEPARINUNFRC 0.38  --   --   --   --   --   --   CREATININE 0.88  --   --  1.05  --   --  1.28*   < > = values in this interval not displayed.    Estimated Creatinine Clearance: 54.1 mL/min (A) (by C-G formula based on SCr of 1.28 mg/dL (H)).  Assessment: 59 year old M w/ new aFlutter (CHADS2VASc = 0-1 for possible HTN), started on apixaban 2/24 and 2/25 transitioned back to heparin for cath. Apixaban last dose 2/25 9:30. Will follow APTT until correlates with HL. 2/25 TEE/DCCV converted patient to NSR. 2/27 back in aflutter briefly then sinus tachycardia.   Platelet count dropped overnight 101>48 >35>21 Stopped heparin- check for HIT and begin bivalirudin drip for anticoagulation HIT antibody + SRA sent  aptt 66 sec at goal  on bivalirudin drip 0.1mg /kg/hr no bleeding noted, h/h ok   BP soft 3/4 - meds held  improved 3/5 restart low dose losartan and spiro Goal of Therapy:  APTT 50-80 sec Monitor platelets by anticoagulation protocol: Yes   Plan:  Continue bivalirudin at 0.1 mg/kg/hr  Monitor daily aPTT, CBC, platelets Monitor for signs/symptoms of bleeding F/U HIT SRA   Bonnita Nasuti Pharm.D. CPP, BCPS Clinical Pharmacist [phone] 05/14/2019 4:28 PM

## 2019-05-14 NOTE — Progress Notes (Signed)
Patient ID: Austin Cobb, male   DOB: 03/16/1960, 59 y.o.   MRN: [id number]     Advanced Heart Failure Rounding Note  PCP-Cardiologist: Donato Heinz, MD   Subjective:    Fully awake and alert today, CVP 4.  SBP 100s-110s. Co-ox 66%. He remains in atrial flutter with rate 100s.   Platelets have dropped from 100s range to 21, HIT positive. Off heparin gtt and started on bivalirudin.  No overt bleeding, hgb stable.   Objective:   Weight Range: 61.6 kg Body mass index is 20.05 kg/m.   Vital Signs:   Temp:  [97.5 F (36.4 C)-99 F (37.2 C)] 99 F (37.2 C) (03/05 0504) Pulse Rate:  [80-107] 97 (03/05 0935) BP: (75-108)/(51-81) 105/80 (03/05 0434) SpO2:  [95 %-100 %] 98 % (03/05 0504) Weight:  [61.6 kg] 61.6 kg (03/05 0504) Last BM Date: 05/12/19  Weight change: Filed Weights   05/12/19 0535 05/13/19 0500 05/14/19 0504  Weight: 57.9 kg 57.9 kg 61.6 kg    Intake/Output:   Intake/Output Summary (Last 24 hours) at 05/14/2019 1112 Last data filed at 05/14/2019 0937 Gross per 24 hour  Intake 472.41 ml  Output --  Net 472.41 ml      Physical Exam    General: NAD Neck: No JVD, no thyromegaly or thyroid nodule.  Lungs: Clear to auscultation bilaterally with normal respiratory effort. CV: Nondisplaced PMI.  Heart mildly tachy, regular S1/S2, no S3/S4, no murmur.  No peripheral edema. Abdomen: Soft, nontender, no hepatosplenomegaly, no distention.  Skin: Intact without lesions or rashes.  Neurologic: Alert and oriented x 3.  Psych: Normal affect. Extremities: No clubbing or cyanosis.  HEENT: Normal.    Telemetry   Atrial flutter 100s (personally reviewed)  Labs    CBC Recent Labs    05/13/19 1142 05/14/19 0442  WBC 6.4 5.9  HGB 11.9* 11.8*  HCT 35.3* 35.2*  MCV 91.2 91.4  PLT 21* 21*   Basic Metabolic Panel Recent Labs    05/13/19 0452 05/14/19 0442  NA 135 139  K 4.0 3.8  CL 104 103  CO2 24 26  GLUCOSE 89 136*  BUN 9 15  CREATININE 1.05  1.28*  CALCIUM 8.2* 8.4*  MG 2.0 1.9  PHOS 3.7 4.4   Liver Function Tests Recent Labs    05/13/19 0452 05/14/19 0442  AST 111* 73*  ALT 650* 479*  ALKPHOS 76 75  BILITOT 2.6* 2.0*  PROT 5.6* 5.6*  ALBUMIN 2.7* 2.7*   No results for input(s): LIPASE, AMYLASE in the last 72 hours. Cardiac Enzymes No results for input(s): CKTOTAL, CKMB, CKMBINDEX, TROPONINI in the last 72 hours.  BNP: BNP (last 3 results) Recent Labs    05/04/19 1540  BNP 2,060.2*    ProBNP (last 3 results) No results for input(s): PROBNP in the last 8760 hours.   D-Dimer No results for input(s): DDIMER in the last 72 hours. Hemoglobin A1C No results for input(s): HGBA1C in the last 72 hours. Fasting Lipid Panel No results for input(s): CHOL, HDL, LDLCALC, TRIG, CHOLHDL, LDLDIRECT in the last 72 hours. Thyroid Function Tests No results for input(s): TSH, T4TOTAL, T3FREE, THYROIDAB in the last 72 hours.  Invalid input(s): FREET3  Other results:   Imaging    DG CHEST PORT 1 VIEW  Result Date: 05/14/2019 CLINICAL DATA:  Shortness of breath. EXAM: PORTABLE CHEST 1 VIEW COMPARISON:  Chest radiograph 05/04/2019 and chest CTA 05/05/2019 FINDINGS: A right PICC has been placed and terminates over the lower  SVC. The cardiac silhouette remains moderately enlarged. There is improved aeration of the lungs with slight residual prominence of the interstitial markings. No overt pulmonary edema, airspace consolidation or pneumothorax is identified. No sizable residual pleural effusions are evident. No acute osseous abnormality is seen. IMPRESSION: Cardiomegaly with improved aeration of the lungs and resolved pleural effusions. Electronically Signed   By: Logan Bores M.D.   On: 05/14/2019 08:11     Medications:     Scheduled Medications: . Chlorhexidine Gluconate Cloth  6 each Topical Daily  . digoxin  0.125 mg Oral Daily  . feeding supplement (ENSURE ENLIVE)  237 mL Oral 4x daily  . folic acid  1 mg Oral  Daily  . lactulose  20 g Oral BID   Or  . lactulose  300 mL Rectal BID  . LORazepam  0-4 mg Oral Q6H   Followed by  . [START ON 05/15/2019] LORazepam  0-4 mg Oral Q12H  . losartan  12.5 mg Oral Daily  . multivitamin with minerals  1 tablet Oral Daily  . nicotine  21 mg Transdermal Daily  . sodium chloride flush  10-40 mL Intracatheter Q12H  . sodium chloride flush  3 mL Intravenous Q12H  . sodium chloride flush  3 mL Intravenous Q12H  . spironolactone  12.5 mg Oral Daily  . thiamine  100 mg Oral Daily   Or  . thiamine  100 mg Intravenous Daily    Infusions: . sodium chloride    . sodium chloride 10 mL/hr at 05/13/19 1514  . amiodarone 30 mg/hr (05/14/19 0548)  . bivalirudin (ANGIOMAX) infusion 0.5 mg/mL (Non-ACS indications) 0.1 mg/kg/hr (05/13/19 1514)    PRN Medications: sodium chloride, hydrALAZINE, metoprolol tartrate, sodium chloride flush, sodium chloride flush   Assessment/Plan   1. Altered mental status/delirium: Suspect ETOH withdrawal with significant rise in BP and history of heavy ETOH.  Also possible hepatic encephalopathy with elevated NH3.  This seems to have resolved, NH3 normal at 24 today.  2.  AKI: Suspect cardiorenal, possibly with low output. Contrast nephropathy from PE CT may also play a role.  This has resolved.  3. Acute systolic CHF: Echo this admission with EF 15%, severe RV dysfunction.  No prior cardiac history.  Etiology is uncertain => could be tachycardia-mediated CMP but exertional dyspnea pre-dated palpitations, could be due to long-standing heavy ETOH use, could be viral cardiomyopathy, cannot rule out CAD. He had DCCV but recurrence of atrial flutter on 2/27  and remains in flutter today, rates in the 80s now.  Co-ox markedly low initially, milrinone started.  He is now off milrinone with stable co-ox, 66% today.  Creatinine stable.  CVP 4.  BP was markedly high for several days, now running lower, likely due to resolution of ETOH withdrawal/DTs.   - No diuretic.  - Can restart spironolactone 12.5 daily.  - Can restart losartan 12.5 daily.    - Continue digoxin 0.125 daily.  - Eventual right/left heart cath, need resolution of thrombocytopenia.  4. Atrial flutter: AFL with RVR at admission, uncertain how long.  He had felt palpitations for about a week.  Possible component of tachy-mediated CMP.  He is s/p TEE-guided DCCV to NSR but atrial flutter recurred on 2/27.  Still in flutter in 100s.  - Continue amiodarone gtt 30 mg/hr - Will need repeat DCCV, had planned to do after cath but now with thrombocytopenia timing is uncertain.  - Consider ablation down the road, especially if he can stop drinking.  -  On bivalirudin with suspected HIT.    5. ETOH abuse: Have strongly encouraged him to quit.  Rehab program would be helpful. Now with withdrawal as above, seems to have resolved.  6. Thrombocytopenia: Platelets have been in 100s range but dropped down to 21K. He had been on heparin gtt, now on bivalirudin. HIT+.  - Sent SRA to confirm HIT.  - Stopped Librium as there is some risk for thrombocytopenia from this.  - No other med is an obvious culprit. - Continue bivalirudin.    Length of Stay: [place], MD  05/14/2019, 11:12 AM  Advanced Heart Failure Team Pager [phone] (M-F; [place])  Please contact [facility] for night-coverage after hours (4p -7a ) and weekends on [email or web address]

## 2019-05-14 NOTE — Progress Notes (Signed)
PROGRESS NOTE    Austin Cobb  [id number] DOB: 1961-02-06 DOA: 05/04/2019 PCP: Patient, No Pcp Per     Brief Narrative:  59 year old BM PMHx Bipolar disorder, Anxiety and Depression tobacco, EtOH and polysubstance abuse,    Presenting with progressive dyspnea for 1 day and exertional dyspnea and fatigue for over 2 months.  Also had paroxysmal nocturnal dyspnea.   In ED, found to be in A.  Flutter with RVR with HR in the 130s.  Also hypertensive with SBP in 170s and DBP in 130s.  UDS negative.  Mildly elevated AST and ALT.BNP 2000.  High-sensitivity troponin 48> 50.  EKG revealed a flutter and LVH.  Lactic acid 2.1> 1.7.  Hgb 11.7.  UA with large LE and many bacteria.  POC COVID-19 test negative.  Cardiology consulted.  Patient admitted for atrial flutter with RVR and unknown type acute CHF.   D-dimer elevated.  CTA chest negative for PE.  Echo with EF of 10 to 15%, global hypokinesis, unknown diastolic function, severely reduced RVEF and severely enlarged RV with RVSP of 28.2 mmHg.   Patient underwent TEE guided cardioversion on 05/06/2019 and converted to NSR. TEE with EF of 10%, moderate MR, no thrombus or significant ASD or PFO.   Advanced heart failure team involved.  Started high-dose IV Lasix but renal function gotten worse.   Subjective: 3/5 A/O x4, but sleepy.  Insisting he was leaving after long talk concerning his present condition, and that he would most likely be DEAD before he reached his home patient agreed to stay and allow [place] to help him.  Stated his lawyer has his HCPOA (listed in chart)   Assessment & Plan:   Active Problems:   Atrial flutter with rapid ventricular response (HCC)   Acute heart failure (HCC)   Essential hypertension   Acute on chronic combined systolic and diastolic CHF (congestive heart failure) (HCC)   Alcohol withdrawal (HCC)   Acute metabolic encephalopathy   AKI (acute kidney injury) ([place])   Elevated d-dimer   Demand ischemia  (HCC)   Elevated troponin   Pleural effusion   Polysubstance abuse (HCC)   E. coli UTI   Agitation   Noncompliance  Acute systolic CHF -Strict in and out +4.3 L -Daily weight Filed Weights   05/12/19 0535 05/13/19 0500 05/14/19 0504  Weight: 57.9 kg 57.9 kg 61.6 kg  -Cardiology consulted -Amiodarone drip per cardiology -Digoxin 0.125 mg daily -Hydralazine PRN -Aldactone 12.5 mg daily -Milrinone drip per cardiology (held) -Metoprolol 2.5 mg PRN  A flutter with RVR - CHA2DS2-VASc score of 2. -Converted to NSR but returned to A. fib on 2/27 -See CHF -Repeat DCCV once acute encephalopathy resolved -Bivalirudin secondary to being HIT positive  EtOH withdrawal/Acute Metabolic Encephalopathy -On admission did not obtain alcohol level -Urine tox screen negative -CIWA protocol.  NOTE Ativan substituted for Librium secondary to increasing thrombocytopenia -3/4 appears to be improving, mittens now off  Acute kidney injury (baseline Cr 1.1) Recent Labs  Lab 05/10/19 0525 05/11/19 0527 05/12/19 0519 05/13/19 0452 05/14/19 0442  CREATININE 0.84 0.95 0.88 1.05 1.28*  -Baseline -Hold diuretics -Hold nephrotoxic medication -Monitor closely  Elevated D-dimer Results for Austin Cobb (MRN [id number]) as of 05/14/2019 07:18  Ref. Range 05/12/2016 11:04 05/04/2019 21:44  D-Dimer, America Brown Latest Ref Range: 0.00 - 0.50 ug/mL-FEU 1.33 (H) 1.54 (H)  -Trend -CTA negative for PE, moderate bilateral pleural effusion see below  Elevated troponin/Demand ischemia Results for Austin Cobb (MRN [id number]) as of  05/14/2019 07:18  Ref. Range 05/04/2019 15:40 05/04/2019 17:19  Troponin I (High Sensitivity) Latest Ref Range: <18 ng/L 48 (H) 50 (H)  -Most likely demand ischemia from a flutter and CHF  Essential HTN -See CHF  Elevated liver enzymes  -Most likely secondary to EtOH abuse, CHF, and Amiodarone -Elevated liver enzymes: Likely due to alcohol, CHF and possibly due to  amiodarone.  CK  within normal range -.  Acute hepatitis panel and HIV negative.  Bilateral pleural effusion -3/5 PCXR pending  UTI positive E. coli -Appears to have completed course of antibiotics -3/4 repeat urine culture pending   Polysubstance abuse -2/23 urine tox screen negative -Cessation counseling when able -Consult CSW for resources -Continue CIWA protocol  Agitation -Haldol 1 mg BID.  May need to titrate up  Tobacco abuse -Smokes 1 PPD -Cessation encouraged. -Nicotine patch   Generalized weakness/debility -PT/OT eval  Euthyroid sick syndrome:  -TSH 5.4.  Free T4 1.68 -Repeat thyroid panel in 4 to 6 weeks outpatient.  Hypomagnesmia -Magnesium goal> 2  Thrombopenia Results for Austin Cobb (MRN [id number]) as of 05/14/2019 18:51  Ref. Range 05/12/2019 05:19 05/12/2019 10:41 05/13/2019 04:52 05/13/2019 11:42 05/14/2019 04:42  Platelets Latest Ref Range: 150 - 400 K/uL 48 (L) 35 (L) 23 (LL) 21 (LL) 21 (LL)  -Avoid all heparin products -Positive HIT antibody positive -3/5 type and cross, if platelets fall any further or any signs of bleeding will transfuse immediately  Noncompliance -Patient requested to leave AMA.  Counseled patient that if he attempted to leave given his poor cardiac condition he would DIE before reaching home, at that point patient changed his mind and requested that we help him.    DVT prophylaxis: Bivalirudin  Code Status: Full Family Communication:  Disposition Plan: TBD per cardiology   Consultants:  Cardiology   Procedures/Significant Events:  2/24 CTA chest PE protocol; similar: Negative for PE-moderate bilateral pleural effusions RIGHT>>> LEFT    I have personally reviewed and interpreted all radiology studies and my findings are as above.  VENTILATOR SETTINGS:    Cultures 2/23 HIV negative 2/23 urine positive E. coli 2/24 SARS coronavirus 2 negative 2/24 acute hepatitis panel negative 3/5 urine  pending     Antimicrobials: Anti-infectives (From admission, onward)   Start     Dose/Rate Stop   05/08/19 1400  amoxicillin (AMOXIL) capsule 500 mg     500 mg 05/09/19 2159   05/05/19 1900  cefTRIAXone (ROCEPHIN) 1 g in sodium chloride 0.9 % 100 mL IVPB  Status:  Discontinued     1 g 200 mL/hr over 30 Minutes 05/08/19 0751   05/04/19 1730  cefTRIAXone (ROCEPHIN) 1 g in sodium chloride 0.9 % 100 mL IVPB     1 g 200 mL/hr over 30 Minutes 05/04/19 2005       Devices    LINES / TUBES:      Continuous Infusions: . sodium chloride    . sodium chloride 10 mL/hr at 05/13/19 1514  . amiodarone 30 mg/hr (05/14/19 0548)  . bivalirudin (ANGIOMAX) infusion 0.5 mg/mL (Non-ACS indications) 0.1 mg/kg/hr (05/13/19 1514)     Objective: Vitals:   05/14/19 0833 05/14/19 0935 05/14/19 1124 05/14/19 1626  BP: 113/84  123/86 (!) 142/93  Pulse: (!) 101 97 (!) 105 (!) 104  Resp: 16  16 15   Temp: 98.5 F (36.9 C)  97.9 F (36.6 C) (!) 97.2 F (36.2 C)  TempSrc: Oral  Oral Axillary  SpO2: 98%  97% 96%  Weight:  Height:        Intake/Output Summary (Last 24 hours) at 05/14/2019 1906 Last data filed at 05/14/2019 1200 Gross per 24 hour  Intake 363 ml  Output --  Net 363 ml   Filed Weights   05/12/19 0535 05/13/19 0500 05/14/19 0504  Weight: 57.9 kg 57.9 kg 61.6 kg   Physical Exam:  General: A/O x4, sleepy but arousable.  Noncompliant trying to leave AMA, no acute respiratory distress, cachectic Eyes: negative scleral hemorrhage, negative anisocoria, negative icterus ENT: Negative Runny nose, negative gingival bleeding, Neck:  Negative scars, masses, torticollis, lymphadenopathy, JVD Lungs: Decreased breath sounds bilaterally without wheezes or crackles Cardiovascular: Regular rate and rhythm without murmur gallop or rub normal S1 and S2 Abdomen: negative abdominal pain, nondistended, positive soft, bowel sounds, no rebound, no ascites, no appreciable mass Extremities: No  significant cyanosis, clubbing, or edema bilateral lower extremities Skin: Negative rashes, lesions, ulcers Psychiatric:  Negative depression, positive anxiety, negative fatigue, negative mania, EXTREMELY POOR understanding of current medical condition Central nervous system:  Cranial nerves II through XII intact, tongue/uvula midline, all extremities muscle strength 5/5, sensation intact throughout, negative dysarthria, negative expressive aphasia, negative receptive aphasia.      Data Reviewed: Care during the described time interval was provided by me .  I have reviewed this patient's available data, including medical history, events of note, physical examination, and all test results as part of my evaluation.  CBC: Recent Labs  Lab 05/12/19 0519 05/12/19 1041 05/13/19 0452 05/13/19 1142 05/14/19 0442  WBC 6.6 5.6 6.2 6.4 5.9  HGB 13.1 12.1* 13.0 11.9* 11.8*  HCT 38.4* 36.6* 39.0 35.3* 35.2*  MCV 90.4 92.0 92.4 91.2 91.4  PLT 48* 35* 23* 21* 21*   Basic Metabolic Panel: Recent Labs  Lab 05/08/19 0527 05/08/19 0527 05/09/19 0537 05/09/19 0537 05/10/19 0525 05/11/19 0527 05/12/19 0519 05/13/19 0452 05/14/19 0442  NA 136   < > 137   < > 135 137 135 135 139  K 3.1*   < > 3.4*   < > 3.4* 3.6 3.9 4.0 3.8  CL 102   < > 105   < > 101 105 101 104 103  CO2 20*   < > 22   < > 22 23 23 24 26   GLUCOSE 143*   < > 143*   < > 209* 102* 132* 89 136*  BUN 41*   < > 18   < > 9 8 6 9 15   CREATININE 2.18*   < > 1.07   < > 0.84 0.95 0.88 1.05 1.28*  CALCIUM 8.0*   < > 8.3*   < > 8.3* 8.3* 8.6* 8.2* 8.4*  MG 2.0   < > 1.7   < > 1.7 1.6* 1.5* 2.0 1.9  PHOS 4.3  --  2.3*  --  2.7  --   --  3.7 4.4   < > = values in this interval not displayed.   GFR: Estimated Creatinine Clearance: 54.1 mL/min (A) (by C-G formula based on SCr of 1.28 mg/dL (H)). Liver Function Tests: Recent Labs  Lab 05/08/19 0527 05/09/19 0537 05/10/19 0525 05/13/19 0452 05/14/19 0442  AST  --   --   --  111* 73*   ALT  --   --   --  650* 479*  ALKPHOS  --   --   --  76 75  BILITOT  --   --   --  2.6* 2.0*  PROT  --   --   --  5.6* 5.6*  ALBUMIN 3.0* 2.9* 2.8* 2.7* 2.7*   No results for input(s): LIPASE, AMYLASE in the last 168 hours. Recent Labs  Lab 05/08/19 0527 05/10/19 2119 05/14/19 0802  AMMONIA 28 29 24    Coagulation Profile: No results for input(s): INR, PROTIME in the last 168 hours. Cardiac Enzymes: No results for input(s): CKTOTAL, CKMB, CKMBINDEX, TROPONINI in the last 168 hours. BNP (last 3 results) No results for input(s): PROBNP in the last 8760 hours. HbA1C: No results for input(s): HGBA1C in the last 72 hours. CBG: Recent Labs  Lab 05/10/19 2149  GLUCAP 103*   Lipid Profile: No results for input(s): CHOL, HDL, LDLCALC, TRIG, CHOLHDL, LDLDIRECT in the last 72 hours. Thyroid Function Tests: No results for input(s): TSH, T4TOTAL, FREET4, T3FREE, THYROIDAB in the last 72 hours. Anemia Panel: No results for input(s): VITAMINB12, FOLATE, FERRITIN, TIBC, IRON, RETICCTPCT in the last 72 hours. Sepsis Labs: No results for input(s): PROCALCITON, LATICACIDVEN in the last 168 hours.  Recent Results (from the past 240 hour(s))  SARS CORONAVIRUS 2 (TAT 6-24 HRS) Nasopharyngeal Nasopharyngeal Swab     Status: None   Collection Time: 05/05/19  8:06 AM   Specimen: Nasopharyngeal Swab  Result Value Ref Range Status   SARS Coronavirus 2 NEGATIVE NEGATIVE Final    Comment: (NOTE) SARS-CoV-2 target nucleic acids are NOT DETECTED. The SARS-CoV-2 RNA is generally detectable in upper and lower respiratory specimens during the acute phase of infection. Negative results do not preclude SARS-CoV-2 infection, do not rule out co-infections with other pathogens, and should not be used as the sole basis for treatment or other patient management decisions. Negative results must be combined with clinical observations, patient history, and epidemiological information. The expected result is  Negative. Fact Sheet for Patients: SugarRoll.be Fact Sheet for Healthcare Providers: https://www.-mathews.com/ This test is not yet approved or cleared by the [place] FDA and  has been authorized for detection and/or diagnosis of SARS-CoV-2 by FDA under an Emergency Use Authorization (EUA). This EUA will remain  in effect (meaning this test can be used) for the duration of the COVID-19 declaration under Section 56 4(b)(1) of the Act, 21 U.S.C. section 360bbb-3(b)(1), unless the authorization is terminated or revoked sooner. Performed at [facility], [place] [address] [place], [place] [address]          Radiology Studies: DG CHEST PORT 1 VIEW  Result Date: 05/14/2019 CLINICAL DATA:  Shortness of breath. EXAM: PORTABLE CHEST 1 VIEW COMPARISON:  Chest radiograph 05/04/2019 and chest CTA 05/05/2019 FINDINGS: A right PICC has been placed and terminates over the lower SVC. The cardiac silhouette remains moderately enlarged. There is improved aeration of the lungs with slight residual prominence of the interstitial markings. No overt pulmonary edema, airspace consolidation or pneumothorax is identified. No sizable residual pleural effusions are evident. No acute osseous abnormality is seen. IMPRESSION: Cardiomegaly with improved aeration of the lungs and resolved pleural effusions. Electronically Signed   By: Logan Bores M.D.   On: 05/14/2019 08:11        Scheduled Meds: . sodium chloride   Intravenous Once  . Chlorhexidine Gluconate Cloth  6 each Topical Daily  . digoxin  0.125 mg Oral Daily  . feeding supplement (ENSURE ENLIVE)  237 mL Oral 4x daily  . folic acid  1 mg Intravenous Daily  . haloperidol lactate  1 mg Intravenous BID  . lactulose  20 g Oral BID   Or  . lactulose  300 mL Rectal BID  .  LORazepam      . LORazepam  0-4 mg Intravenous Q4H   Followed by  . [START ON 05/16/2019] LORazepam  0-4 mg Intravenous Q8H  .  losartan  12.5 mg Oral Daily  . multivitamin with minerals  1 tablet Oral Daily  . nicotine  21 mg Transdermal Daily  . sodium chloride flush  10-40 mL Intracatheter Q12H  . sodium chloride flush  3 mL Intravenous Q12H  . sodium chloride flush  3 mL Intravenous Q12H  . spironolactone  12.5 mg Oral Daily  . thiamine  100 mg Oral Daily   Or  . thiamine  100 mg Intravenous Daily   Continuous Infusions: . sodium chloride    . sodium chloride 10 mL/hr at 05/13/19 1514  . amiodarone 30 mg/hr (05/14/19 0548)  . bivalirudin (ANGIOMAX) infusion 0.5 mg/mL (Non-ACS indications) 0.1 mg/kg/hr (05/13/19 1514)     LOS: 10 days    Time spent:40 min    Avian Greenawalt, Geraldo Docker, MD [facility] Pager [phone]  If 7PM-7AM, please contact night-coverage [email or web address] Password [facility] 05/14/2019, 7:06 PM

## 2019-05-15 DIAGNOSIS — I5021 Acute systolic (congestive) heart failure: Secondary | ICD-10-CM

## 2019-05-15 DIAGNOSIS — B3749 Other urogenital candidiasis: Secondary | ICD-10-CM | POA: Diagnosis present

## 2019-05-15 LAB — PHOSPHORUS: Phosphorus: 4.1 mg/dL (ref 2.5–4.6)

## 2019-05-15 LAB — COMPREHENSIVE METABOLIC PANEL
ALT: 431 U/L — ABNORMAL HIGH (ref 0–44)
AST: 59 U/L — ABNORMAL HIGH (ref 15–41)
Albumin: 3 g/dL — ABNORMAL LOW (ref 3.5–5.0)
Alkaline Phosphatase: 77 U/L (ref 38–126)
Anion gap: 8 (ref 5–15)
BUN: 12 mg/dL (ref 6–20)
CO2: 24 mmol/L (ref 22–32)
Calcium: 8.7 mg/dL — ABNORMAL LOW (ref 8.9–10.3)
Chloride: 107 mmol/L (ref 98–111)
Creatinine, Ser: 1.09 mg/dL (ref 0.61–1.24)
GFR calc Af Amer: >60 mL/min (ref >60)
GFR calc non Af Amer: >60 mL/min (ref >60)
Glucose, Bld: 98 mg/dL (ref 70–99)
Potassium: 4.2 mmol/L (ref 3.5–5.1)
Sodium: 139 mmol/L (ref 135–145)
Total Bilirubin: 1.5 mg/dL — ABNORMAL HIGH (ref 0.3–1.2)
Total Protein: 6.1 g/dL — ABNORMAL LOW (ref 6.5–8.1)

## 2019-05-15 LAB — CBC
HCT: 37.9 % — ABNORMAL LOW (ref 39.0–52.0)
Hemoglobin: 12.3 g/dL — ABNORMAL LOW (ref 13.0–17.0)
MCH: 30.1 pg (ref 26.0–34.0)
MCHC: 32.5 g/dL (ref 30.0–36.0)
MCV: 92.7 fL (ref 80.0–100.0)
Platelets: 27 10*3/uL — CL (ref 150–400)
RBC: 4.09 MIL/uL — ABNORMAL LOW (ref 4.22–5.81)
RDW: 15.5 % (ref 11.5–15.5)
WBC: 7.1 10*3/uL (ref 4.0–10.5)
nRBC: 0 % (ref 0.0–0.2)

## 2019-05-15 LAB — COOXEMETRY PANEL
Carboxyhemoglobin: 2 % — ABNORMAL HIGH (ref 0.5–1.5)
Methemoglobin: 0.8 % (ref 0.0–1.5)
O2 Saturation: 88.7 %
Total hemoglobin: 12.6 g/dL (ref 12.0–16.0)

## 2019-05-15 LAB — APTT: aPTT: 52 seconds — ABNORMAL HIGH (ref 24–36)

## 2019-05-15 LAB — URINE CULTURE: Culture: >=100000 — AB

## 2019-05-15 LAB — D-DIMER, QUANTITATIVE: D-Dimer, Quant: 0.92 ug/mL-FEU — ABNORMAL HIGH (ref 0.00–0.50)

## 2019-05-15 LAB — AMMONIA: Ammonia: 21 umol/L (ref 9–35)

## 2019-05-15 LAB — MAGNESIUM: Magnesium: 1.7 mg/dL (ref 1.7–2.4)

## 2019-05-15 LAB — DIGOXIN LEVEL: Digoxin Level: 0.5 ng/mL — ABNORMAL LOW (ref 0.8–2.0)

## 2019-05-15 MED ORDER — SODIUM CHLORIDE 0.9% IV SOLUTION: Freq: Once | INTRAVENOUS | Status: AC

## 2019-05-15 MED ORDER — FUROSEMIDE 10 MG/ML IJ SOLN
20.0000 mg | Freq: Once | INTRAMUSCULAR | Status: AC
  Administered 2019-05-15: 20 mg via INTRAVENOUS
  Filled 2019-05-15: qty 2

## 2019-05-15 MED ORDER — LOSARTAN POTASSIUM 25 MG PO TABS
25.0000 mg | ORAL_TABLET | Freq: Every day | ORAL | Status: DC
  Administered 2019-05-15: 25 mg via ORAL
  Filled 2019-05-15: qty 1

## 2019-05-15 MED ORDER — MAGNESIUM SULFATE 2 GM/50ML IV SOLN
2.0000 g | Freq: Once | INTRAVENOUS | Status: AC
  Administered 2019-05-15: 2 g via INTRAVENOUS
  Filled 2019-05-15: qty 50

## 2019-05-15 MED ORDER — FLUCONAZOLE 200 MG PO TABS
200.0000 mg | ORAL_TABLET | Freq: Every day | ORAL | Status: DC
  Administered 2019-05-15 – 2019-05-24 (×10): 200 mg via ORAL
  Filled 2019-05-15 (×12): qty 1

## 2019-05-15 NOTE — Progress Notes (Signed)
[place] for Heparin >Bivalirudin Indication: atrial fibrillation  Allergies  Allergen Reactions  . Heparin     R/o HIT 3/3    Patient Measurements: Height: 5\' 9"  (175.3 cm) Weight: 128 lb 1.4 oz (58.1 kg) IBW/kg (Calculated) : 70.7   Vital Signs: Temp: 97.6 F (36.4 C) (03/06 0429) Temp Source: Oral (03/06 0429) BP: 150/104 (03/06 0429) Pulse Rate: 103 (03/06 0429)  Labs: Recent Labs    05/13/19 0452 05/13/19 0452 05/13/19 1142 05/13/19 1142 05/14/19 0442 05/15/19 0444  HGB 13.0   < > 11.9*   < > 11.8* 12.3*  HCT 39.0   < > 35.3*  --  35.2* 37.9*  PLT 23*   < > 21*  --  21* 27*  APTT 78*  --   --   --  67* 52*  CREATININE 1.05  --   --   --  1.28* 1.09   < > = values in this interval not displayed.    Estimated Creatinine Clearance: 60 mL/min (by C-G formula based on SCr of 1.09 mg/dL).  Assessment: 59 year old M w/ new aFlutter (CHADS2VASc = 0-1 for possible HTN), started on apixaban 2/24 and 2/25 transitioned back to heparin for cath. Apixaban last dose 2/25 9:30. Will follow APTT until correlates with HL. 2/25 TEE/DCCV converted patient to NSR.   He is now on bivalirudin for possible HIT (heparin antibody negative; SRA pending) -aPTT at the low end of goal -platelets= 21  Goal of Therapy:  APTT 50-80 sec Monitor platelets by anticoagulation protocol: Yes   Plan:  Increase bivalirudin to 1.1mg /kg/hr to keep in range Monitor daily aPTT, CBC, platelets F/U HIT SRA  Hildred Laser, PharmD Clinical Pharmacist **Pharmacist phone directory can now be found on [email or web address] ([email or web address]).  Listed under [place].

## 2019-05-15 NOTE — Progress Notes (Signed)
Call placed for Mr. Austin Cobb for a consent. Awaiting for a call back.  Idolina Primer, RN

## 2019-05-15 NOTE — Progress Notes (Signed)
PROGRESS NOTE    POL HODO  [id number] DOB: 1960-08-15 DOA: 05/04/2019 PCP: Patient, No Pcp Per     Brief Narrative:  59 year old BM PMHx Bipolar disorder, Anxiety and Depression tobacco, EtOH and polysubstance abuse,    Presenting with progressive dyspnea for 1 day and exertional dyspnea and fatigue for over 2 months.  Also had paroxysmal nocturnal dyspnea.   In ED, found to be in A.  Flutter with RVR with HR in the 130s.  Also hypertensive with SBP in 170s and DBP in 130s.  UDS negative.  Mildly elevated AST and ALT.BNP 2000.  High-sensitivity troponin 48> 50.  EKG revealed a flutter and LVH.  Lactic acid 2.1> 1.7.  Hgb 11.7.  UA with large LE and many bacteria.  POC COVID-19 test negative.  Cardiology consulted.  Patient admitted for atrial flutter with RVR and unknown type acute CHF.   D-dimer elevated.  CTA chest negative for PE.  Echo with EF of 10 to 15%, global hypokinesis, unknown diastolic function, severely reduced RVEF and severely enlarged RV with RVSP of 28.2 mmHg.   Patient underwent TEE guided cardioversion on 05/06/2019 and converted to NSR. TEE with EF of 10%, moderate MR, no thrombus or significant ASD or PFO.   Advanced heart failure team involved.  Started high-dose IV Lasix but renal function gotten worse.   Subjective: 3/6 somnolent but arousable.  Follows some commands.   Assessment & Plan:   Active Problems:   Atrial flutter with rapid ventricular response (HCC)   Acute heart failure (HCC)   Essential hypertension   Acute on chronic combined systolic and diastolic CHF (congestive heart failure) (HCC)   Alcohol withdrawal (HCC)   Acute metabolic encephalopathy   AKI (acute kidney injury) ([place])   Elevated d-dimer   Demand ischemia (HCC)   Elevated troponin   Pleural effusion   Polysubstance abuse (HCC)   E. coli UTI   Agitation   Noncompliance   Yeast UTI  Acute systolic CHF -Strict in and out +4.4 L -Daily weight Filed Weights    05/13/19 0500 05/14/19 0504 05/15/19 0429  Weight: 57.9 kg 61.6 kg 58.1 kg  -Cardiology consulted -Amiodarone drip per cardiology -Digoxin 0.125 mg daily -Hydralazine PRN -Aldactone 12.5 mg daily -Milrinone drip per cardiology (held) -Metoprolol 2.5 mg PRN -3/6 Lasix  A flutter with RVR - CHA2DS2-VASc score of 2. -Converted to NSR but returned to A. fib on 2/27 -See CHF -Repeat DCCV once acute encephalopathy resolved and platelets recover -Bivalirudin secondary to being HIT positive  EtOH withdrawal/Acute Metabolic Encephalopathy -On admission did not obtain alcohol level -Urine tox screen negative -CIWA protocol.  NOTE Ativan substituted for Librium secondary to increasing thrombocytopenia -3/4 appears to be improving, mittens now off -3/5 Recent patient became agitated attempted to leave AMA, pulling out IV lines. -3/5 Restarted CIWA protocol IV, Haldol scheduled  Acute kidney injury (baseline Cr 1.1) Recent Labs  Lab 05/11/19 0527 05/12/19 0519 05/13/19 0452 05/14/19 0442 05/15/19 0444  CREATININE 0.95 0.88 1.05 1.28* 1.09  -Baseline -Hold diuretics -Hold nephrotoxic medication -Monitor closely  Elevated D-dimer Results for RAMBO, FAZEKAS (MRN [id number]) as of 05/14/2019 07:18  Ref. Range 05/12/2016 11:04 05/04/2019 21:44  D-Dimer, America Brown Latest Ref Range: 0.00 - 0.50 ug/mL-FEU 1.33 (H) 1.54 (H)  -Trend -CTA negative for PE, moderate bilateral pleural effusion see below  Elevated troponin/Demand ischemia Results for DONNIE, GHILARDI (MRN [id number]) as of 05/14/2019 07:18  Ref. Range 05/04/2019 15:40 05/04/2019 17:19  Troponin I (High  Sensitivity) Latest Ref Range: <18 ng/L 48 (H) 50 (H)  -Most likely demand ischemia from a flutter and CHF  Essential HTN -See CHF  Elevated liver enzymes  -Most likely secondary to EtOH abuse, CHF, and Amiodarone -Elevated liver enzymes: Likely due to alcohol, CHF and possibly due to amiodarone.   -Acute hepatitis panel and  HIV negative. -3/6 liver enzymes improving   Bilateral pleural effusion -3/5 PCXR; improving aeration see results below  UTI positive E. Coli/ UTI positive yeast -Appears to have completed course of antibiotics -3/5 repeat urine culture positive yeast -3/6 Fluconazole for 2 weeks  Polysubstance abuse -2/23 urine tox screen negative -Cessation counseling when able -Consult CSW for resources -Continue CIWA protocol  Agitation -Haldol 1 mg BID.  May need to titrate up  Tobacco abuse -Smokes 1 PPD -Cessation encouraged. -Nicotine patch   Generalized weakness/debility -PT/OT eval  Euthyroid sick syndrome:  -TSH 5.4.  Free T4 1.68 -Repeat thyroid panel in 4 to 6 weeks outpatient.  Hypomagnesmia -Magnesium goal> 2 -Magnesium IV 2 g  Thrombopenia Results for SEBASTAIN, ROEBKE (MRN [id number]) as of 05/15/2019 09:58  Ref. Range 05/12/2019 10:41 05/13/2019 04:52 05/13/2019 11:42 05/14/2019 04:42 05/15/2019 04:44  Platelets Latest Ref Range: 150 - 400 K/uL 35 (L) 23 (LL) 21 (LL) 21 (LL) 27 (LL)  -Avoid all heparin products -Positive HIT antibody positive -3/5 type and cross, if platelets fall any further or any signs of bleeding will transfuse immediately -3/6 appears platelets may be starting to trend up, however will take significant amount of time to recover to point where cardiology can perform multiple invasive procedures.. -3/6 transfuse 1 unit platelets  Noncompliance -Patient requested to leave AMA.  Counseled patient that if he attempted to leave given his poor cardiac condition he would DIE before reaching home, at that point patient changed his mind and requested that we help him.    DVT prophylaxis: Bivalirudin  Code Status: Full Family Communication: 3/6 spoke with Mr Evalina Field who is listed as patient's POC and per patient makes his healthcare decisions.  Explained need for transfusion of platelets in order to allow cardiologist to perform procedures to determine  definitive treatment for patient Mr Baucino agreed to procedure. Disposition Plan: TBD per cardiology   Consultants:  Cardiology   Procedures/Significant Events:  2/24 CTA chest PE protocol; similar: Negative for PE-moderate bilateral pleural effusions RIGHT>>> LEFT 2/25 Echocardiogram;LVEF; <20%. The left-ventricle has severely decreased function.  -The left ventricle demonstrates global hypokinesis.  -Left ventricular internal cavity size was mildly to moderately dilated.  -Mitral valve Mild to moderate regurgitation.  3/6 transfuse 1 unit platelets 3/5 CXR;-cardiomegaly with improved aeration of the lungs and resolved pleural effusions   I have personally reviewed and interpreted all radiology studies and my findings are as above.  VENTILATOR SETTINGS:    Cultures 2/23 HIV negative 2/23 urine positive E. coli 2/24 SARS coronavirus 2 negative 2/24 acute hepatitis panel negative 3/5 urine pending     Antimicrobials: Anti-infectives (From admission, onward)   Start     Dose/Rate Stop   05/08/19 1400  amoxicillin (AMOXIL) capsule 500 mg     500 mg 05/09/19 2159   05/05/19 1900  cefTRIAXone (ROCEPHIN) 1 g in sodium chloride 0.9 % 100 mL IVPB  Status:  Discontinued     1 g 200 mL/hr over 30 Minutes 05/08/19 0751   05/04/19 1730  cefTRIAXone (ROCEPHIN) 1 g in sodium chloride 0.9 % 100 mL IVPB     1 g 200 mL/hr  over 30 Minutes 05/04/19 2005       Devices    LINES / TUBES:      Continuous Infusions: . sodium chloride    . sodium chloride 10 mL/hr at 05/13/19 1514  . amiodarone 30 mg/hr (05/15/19 1516)  . bivalirudin (ANGIOMAX) infusion 0.5 mg/mL (Non-ACS indications) 0.11 mg/kg/hr (05/15/19 1339)     Objective: Vitals:   05/15/19 0300 05/15/19 0429 05/15/19 1047 05/15/19 1712  BP:  (!) 150/104    Pulse: 96 (!) 103 (!) 107   Resp:  20    Temp:  97.6 F (36.4 C)  (!) 97.4 F (36.3 C)  TempSrc:  Oral  Oral  SpO2:  97%    Weight:  58.1 kg    Height:         Intake/Output Summary (Last 24 hours) at 05/15/2019 1727 Last data filed at 05/15/2019 1125 Gross per 24 hour  Intake 924.39 ml  Output 200 ml  Net 724.39 ml   Filed Weights   05/13/19 0500 05/14/19 0504 05/15/19 0429  Weight: 57.9 kg 61.6 kg 58.1 kg   Physical Exam:  General: Sleepy but arousable follows some commands.  No acute respiratory distress, cachectic Eyes: negative scleral hemorrhage, negative anisocoria, negative icterus ENT: Negative Runny nose, negative gingival bleeding, Neck:  Negative scars, masses, torticollis, lymphadenopathy, JVD Lungs: Clear to auscultation bilaterally without wheezes or crackles Cardiovascular: Regular rate and rhythm without murmur gallop or rub normal S1 and S2 Abdomen: negative abdominal pain, nondistended, positive soft, bowel sounds, no rebound, no ascites, no appreciable mass Extremities: No significant cyanosis, clubbing, or edema bilateral lower extremities Skin: Negative rashes, lesions, ulcers Psychiatric:  Negative depression, negative anxiety, negative fatigue, negative mania  Central nervous system:  Cranial nerves II through XII intact, tongue/uvula midline, all extremities muscle strength 5/5, sensation intact throughout, negative dysarthria, negative expressive aphasia, negative receptive aphasia.      Data Reviewed: Care during the described time interval was provided by me .  I have reviewed this patient's available data, including medical history, events of note, physical examination, and all test results as part of my evaluation.  CBC: Recent Labs  Lab 05/12/19 1041 05/13/19 0452 05/13/19 1142 05/14/19 0442 05/15/19 0444  WBC 5.6 6.2 6.4 5.9 7.1  HGB 12.1* 13.0 11.9* 11.8* 12.3*  HCT 36.6* 39.0 35.3* 35.2* 37.9*  MCV 92.0 92.4 91.2 91.4 92.7  PLT 35* 23* 21* 21* 27*   Basic Metabolic Panel: Recent Labs  Lab 05/09/19 0537 05/09/19 0537 05/10/19 0525 05/10/19 0525 05/11/19 0527 05/12/19 0519  05/13/19 0452 05/14/19 0442 05/15/19 0444  NA 137   < > 135   < > 137 135 135 139 139  K 3.4*   < > 3.4*   < > 3.6 3.9 4.0 3.8 4.2  CL 105   < > 101   < > 105 101 104 103 107  CO2 22   < > 22   < > 23 23 24 26 24   GLUCOSE 143*   < > 209*   < > 102* 132* 89 136* 98  BUN 18   < > 9   < > 8 6 9 15 12   CREATININE 1.07   < > 0.84   < > 0.95 0.88 1.05 1.28* 1.09  CALCIUM 8.3*   < > 8.3*   < > 8.3* 8.6* 8.2* 8.4* 8.7*  MG 1.7   < > 1.7   < > 1.6* 1.5* 2.0 1.9 1.7  PHOS 2.3*  --  2.7  --   --   --  3.7 4.4 4.1   < > = values in this interval not displayed.   GFR: Estimated Creatinine Clearance: 60 mL/min (by C-G formula based on SCr of 1.09 mg/dL). Liver Function Tests: Recent Labs  Lab 05/09/19 0537 05/10/19 0525 05/13/19 0452 05/14/19 0442 05/15/19 0444  AST  --   --  111* 73* 59*  ALT  --   --  650* 479* 431*  ALKPHOS  --   --  76 75 77  BILITOT  --   --  2.6* 2.0* 1.5*  PROT  --   --  5.6* 5.6* 6.1*  ALBUMIN 2.9* 2.8* 2.7* 2.7* 3.0*   No results for input(s): LIPASE, AMYLASE in the last 168 hours. Recent Labs  Lab 05/10/19 2119 05/14/19 0802 05/15/19 0441  AMMONIA 29 24 21    Coagulation Profile: No results for input(s): INR, PROTIME in the last 168 hours. Cardiac Enzymes: No results for input(s): CKTOTAL, CKMB, CKMBINDEX, TROPONINI in the last 168 hours. BNP (last 3 results) No results for input(s): PROBNP in the last 8760 hours. HbA1C: No results for input(s): HGBA1C in the last 72 hours. CBG: Recent Labs  Lab 05/10/19 2149  GLUCAP 103*   Lipid Profile: No results for input(s): CHOL, HDL, LDLCALC, TRIG, CHOLHDL, LDLDIRECT in the last 72 hours. Thyroid Function Tests: No results for input(s): TSH, T4TOTAL, FREET4, T3FREE, THYROIDAB in the last 72 hours. Anemia Panel: No results for input(s): VITAMINB12, FOLATE, FERRITIN, TIBC, IRON, RETICCTPCT in the last 72 hours. Sepsis Labs: No results for input(s): PROCALCITON, LATICACIDVEN in the last 168  hours.  Recent Results (from the past 240 hour(s))  Culture, Urine     Status: Abnormal   Collection Time: 05/14/19  7:49 AM   Specimen: Urine, Random  Result Value Ref Range Status   Specimen Description URINE, RANDOM  Final   Special Requests   Final    NONE Performed at [facility], [address] [place], [place] [address]    Culture >=100,000 COLONIES/mL YEAST (A)  Final   Report Status 05/15/2019 FINAL  Final         Radiology Studies: DG CHEST PORT 1 VIEW  Result Date: 05/14/2019 CLINICAL DATA:  Shortness of breath. EXAM: PORTABLE CHEST 1 VIEW COMPARISON:  Chest radiograph 05/04/2019 and chest CTA 05/05/2019 FINDINGS: A right PICC has been placed and terminates over the lower SVC. The cardiac silhouette remains moderately enlarged. There is improved aeration of the lungs with slight residual prominence of the interstitial markings. No overt pulmonary edema, airspace consolidation or pneumothorax is identified. No sizable residual pleural effusions are evident. No acute osseous abnormality is seen. IMPRESSION: Cardiomegaly with improved aeration of the lungs and resolved pleural effusions. Electronically Signed   By: Logan Bores M.D.   On: 05/14/2019 08:11        Scheduled Meds: . sodium chloride   Intravenous Once  . sodium chloride   Intravenous Once  . Chlorhexidine Gluconate Cloth  6 each Topical Daily  . digoxin  0.125 mg Oral Daily  . feeding supplement (ENSURE ENLIVE)  237 mL Oral 4x daily  . folic acid  1 mg Intravenous Daily  . furosemide  20 mg Intravenous Once  . haloperidol lactate  1 mg Intravenous BID  . lactulose  20 g Oral BID   Or  . lactulose  300 mL Rectal BID  . LORazepam  0-4 mg Intravenous Q4H   Followed by  . [  START ON 05/16/2019] LORazepam  0-4 mg Intravenous Q8H  . losartan  25 mg Oral Daily  . multivitamin with minerals  1 tablet Oral Daily  . nicotine  21 mg Transdermal Daily  . sodium chloride flush  10-40 mL Intracatheter Q12H   . sodium chloride flush  3 mL Intravenous Q12H  . sodium chloride flush  3 mL Intravenous Q12H  . spironolactone  12.5 mg Oral Daily  . thiamine  100 mg Oral Daily   Or  . thiamine  100 mg Intravenous Daily   Continuous Infusions: . sodium chloride    . sodium chloride 10 mL/hr at 05/13/19 1514  . amiodarone 30 mg/hr (05/15/19 1516)  . bivalirudin (ANGIOMAX) infusion 0.5 mg/mL (Non-ACS indications) 0.11 mg/kg/hr (05/15/19 1339)     LOS: 11 days    Time spent:40 min    Crescent Gotham, Geraldo Docker, MD [facility] Pager [phone]  If 7PM-7AM, please contact night-coverage [email or web address] Password [email or web address] 05/15/2019, 5:27 PM

## 2019-05-15 NOTE — Progress Notes (Signed)
Progress Note  Patient Name: Austin Cobb Date of Encounter: 05/15/2019  Primary Cardiologist: Donato Heinz, MD   Subjective   No complaints this AM  Inpatient Medications    Scheduled Meds: . sodium chloride   Intravenous Once  . Chlorhexidine Gluconate Cloth  6 each Topical Daily  . digoxin  0.125 mg Oral Daily  . feeding supplement (ENSURE ENLIVE)  237 mL Oral 4x daily  . folic acid  1 mg Intravenous Daily  . haloperidol lactate  1 mg Intravenous BID  . lactulose  20 g Oral BID   Or  . lactulose  300 mL Rectal BID  . LORazepam  0-4 mg Intravenous Q4H   Followed by  . [START ON 05/16/2019] LORazepam  0-4 mg Intravenous Q8H  . losartan  12.5 mg Oral Daily  . multivitamin with minerals  1 tablet Oral Daily  . nicotine  21 mg Transdermal Daily  . sodium chloride flush  10-40 mL Intracatheter Q12H  . sodium chloride flush  3 mL Intravenous Q12H  . sodium chloride flush  3 mL Intravenous Q12H  . spironolactone  12.5 mg Oral Daily  . thiamine  100 mg Oral Daily   Or  . thiamine  100 mg Intravenous Daily   Continuous Infusions: . sodium chloride    . sodium chloride 10 mL/hr at 05/13/19 1514  . amiodarone 30 mg/hr (05/15/19 0051)  . bivalirudin (ANGIOMAX) infusion 0.5 mg/mL (Non-ACS indications) 0.1 mg/kg/hr (05/13/19 1514)   PRN Meds: sodium chloride, hydrALAZINE, LORazepam **OR** LORazepam, metoprolol tartrate, sodium chloride flush, sodium chloride flush   Vital Signs    Vitals:   05/14/19 2300 05/15/19 0200 05/15/19 0300 05/15/19 0429  BP:    (!) 150/104  Pulse: (!) 104 96 96 (!) 103  Resp:    20  Temp:    97.6 F (36.4 C)  TempSrc:    Oral  SpO2:  100%  97%  Weight:    58.1 kg  Height:        Intake/Output Summary (Last 24 hours) at 05/15/2019 0814 Last data filed at 05/15/2019 0400 Gross per 24 hour  Intake 1041.39 ml  Output 600 ml  Net 441.39 ml   Last 3 Weights 05/15/2019 05/14/2019 05/13/2019  Weight (lbs) 128 lb 1.4 oz 135 lb 12.9 oz 127  lb 10.3 oz  Weight (kg) 58.1 kg 61.6 kg 57.9 kg      Telemetry    Aflutter rate 90s to low 100s - Personally Reviewed  ECG    n/a - Personally Reviewed  Physical Exam   GEN: No acute distress.   Neck: No JVD Cardiac: Regular, tachy, 2/6 systolic murmur apex Respiratory: Clear to auscultation bilaterally. GI: Soft, nontender, non-distended  MS: No edema; No deformity. Neuro:  Nonfocal  Psych: Normal affect   Labs    High Sensitivity Troponin:   Recent Labs  Lab 05/04/19 1540 05/04/19 1719  TROPONINIHS 48* 50*      Chemistry Recent Labs  Lab 05/13/19 0452 05/14/19 0442 05/15/19 0444  NA 135 139 139  K 4.0 3.8 4.2  CL 104 103 107  CO2 24 26 24   GLUCOSE 89 136* 98  BUN 9 15 12   CREATININE 1.05 1.28* 1.09  CALCIUM 8.2* 8.4* 8.7*  PROT 5.6* 5.6* 6.1*  ALBUMIN 2.7* 2.7* 3.0*  AST 111* 73* 59*  ALT 650* 479* 431*  ALKPHOS 76 75 77  BILITOT 2.6* 2.0* 1.5*  GFRNONAA >60 >60 >60  GFRAA >60 >60 >  60  ANIONGAP 7 10 8      Hematology Recent Labs  Lab 05/13/19 1142 05/14/19 0442 05/15/19 0444  WBC 6.4 5.9 7.1  RBC 3.87* 3.85* 4.09*  HGB 11.9* 11.8* 12.3*  HCT 35.3* 35.2* 37.9*  MCV 91.2 91.4 92.7  MCH 30.7 30.6 30.1  MCHC 33.7 33.5 32.5  RDW 15.2 15.1 15.5  PLT 21* 21* 27*    BNPNo results for input(s): BNP, PROBNP in the last 168 hours.   DDimer  Recent Labs  Lab 05/14/19 0800 05/15/19 0444  DDIMER 0.81* 0.92*     Radiology    DG CHEST PORT 1 VIEW  Result Date: 05/14/2019 CLINICAL DATA:  Shortness of breath. EXAM: PORTABLE CHEST 1 VIEW COMPARISON:  Chest radiograph 05/04/2019 and chest CTA 05/05/2019 FINDINGS: A right PICC has been placed and terminates over the lower SVC. The cardiac silhouette remains moderately enlarged. There is improved aeration of the lungs with slight residual prominence of the interstitial markings. No overt pulmonary edema, airspace consolidation or pneumothorax is identified. No sizable residual pleural effusions are  evident. No acute osseous abnormality is seen. IMPRESSION: Cardiomegaly with improved aeration of the lungs and resolved pleural effusions. Electronically Signed   By: Logan Bores M.D.   On: 05/14/2019 08:11    Cardiac Studies     Patient Profile     59 y.o. male Austin Cobb is seen today for evaluation of new biventricular heart failure at the request of Dr. Gardiner Rhyme,   Assessment & Plan    1. Acute systolic HF/Biventricular failure - 04/2019 echo LVEF 10-15%, severe RV dysfunction, mild to mod MR, dilated IVC - new diagnosis this admission, no prior cardiac history - possible etiologies included tachy medicated, EtOH, ischemic - eventual cath once thrombocytopenia has resolved  - low coox initially, started on milrinone transiently, now off.  - COOX yesterday 66, today reported 88. CVP 8 today.  - medical therapy with digoxin 0.125, losartan 12.5, aldactone 12.5. no beta blocker due to initially low output. Not currently on diuretic. Likely start diuretic tomorrow - bp's remain elevated, increase losartan to 25mg  daily.    2. AMS/Deleirum - possible EtOH withdrawal, hepatic encephalopathy   3. HIT +/Thrombocytopenia - off hep gtt, started on bivalirudin - placelets are 27 today  4. Aflutter - had DCCV but recurrence of aflutter - currently rate controlled flutter - on amio gtt and oral digoxin, plan for repeat DCCV after amio has loaded though will also need thrombocytopenia to resolve - on bivalirudin due to HIT + - rates low 100s  5. AKI  likely cardiorenal, low output HF - mild elevation in Cr yesterday, downtrending   For questions or updates, please contact [facility] Please consult [email or web address] for contact info under        Signed, Carlyle Dolly, MD  05/15/2019, 8:14 AM

## 2019-05-16 DIAGNOSIS — I5031 Acute diastolic (congestive) heart failure: Secondary | ICD-10-CM

## 2019-05-16 LAB — COMPREHENSIVE METABOLIC PANEL
ALT: 318 U/L — ABNORMAL HIGH (ref 0–44)
AST: 49 U/L — ABNORMAL HIGH (ref 15–41)
Albumin: 3 g/dL — ABNORMAL LOW (ref 3.5–5.0)
Alkaline Phosphatase: 74 U/L (ref 38–126)
Anion gap: 10 (ref 5–15)
BUN: 10 mg/dL (ref 6–20)
CO2: 27 mmol/L (ref 22–32)
Calcium: 8.5 mg/dL — ABNORMAL LOW (ref 8.9–10.3)
Chloride: 103 mmol/L (ref 98–111)
Creatinine, Ser: 1.19 mg/dL (ref 0.61–1.24)
GFR calc Af Amer: >60 mL/min (ref >60)
GFR calc non Af Amer: >60 mL/min (ref >60)
Glucose, Bld: 108 mg/dL — ABNORMAL HIGH (ref 70–99)
Potassium: 3.7 mmol/L (ref 3.5–5.1)
Sodium: 140 mmol/L (ref 135–145)
Total Bilirubin: 1 mg/dL (ref 0.3–1.2)
Total Protein: 6.1 g/dL — ABNORMAL LOW (ref 6.5–8.1)

## 2019-05-16 LAB — APTT: aPTT: 57 seconds — ABNORMAL HIGH (ref 24–36)

## 2019-05-16 LAB — COOXEMETRY PANEL
Carboxyhemoglobin: 1.1 % (ref 0.5–1.5)
Methemoglobin: 0.6 % (ref 0.0–1.5)
O2 Saturation: 56.4 %
Total hemoglobin: 11.1 g/dL — ABNORMAL LOW (ref 12.0–16.0)

## 2019-05-16 LAB — BPAM PLATELET PHERESIS
Blood Product Expiration Date: 202103072359
ISSUE DATE / TIME: 202103061743
Unit Type and Rh: 5100

## 2019-05-16 LAB — MAGNESIUM: Magnesium: 1.7 mg/dL (ref 1.7–2.4)

## 2019-05-16 LAB — PHOSPHORUS: Phosphorus: 5 mg/dL — ABNORMAL HIGH (ref 2.5–4.6)

## 2019-05-16 LAB — PREPARE PLATELET PHERESIS: Unit division: 0

## 2019-05-16 LAB — CBC
HCT: 34.3 % — ABNORMAL LOW (ref 39.0–52.0)
Hemoglobin: 11.3 g/dL — ABNORMAL LOW (ref 13.0–17.0)
MCH: 30.5 pg (ref 26.0–34.0)
MCHC: 32.9 g/dL (ref 30.0–36.0)
MCV: 92.5 fL (ref 80.0–100.0)
Platelets: 76 10*3/uL — ABNORMAL LOW (ref 150–400)
RBC: 3.71 MIL/uL — ABNORMAL LOW (ref 4.22–5.81)
RDW: 15.4 % (ref 11.5–15.5)
WBC: 6.6 10*3/uL (ref 4.0–10.5)
nRBC: 0 % (ref 0.0–0.2)

## 2019-05-16 LAB — AMMONIA: Ammonia: 19 umol/L (ref 9–35)

## 2019-05-16 LAB — D-DIMER, QUANTITATIVE: D-Dimer, Quant: 1.16 ug/mL-FEU — ABNORMAL HIGH (ref 0.00–0.50)

## 2019-05-16 MED ORDER — SODIUM CHLORIDE 0.9% IV SOLUTION: Freq: Once | INTRAVENOUS | Status: AC

## 2019-05-16 MED ORDER — HYDRALAZINE HCL 20 MG/ML IJ SOLN
10.0000 mg | Freq: Four times a day (QID) | INTRAMUSCULAR | Status: DC | PRN
  Administered 2019-05-18 (×2): 10 mg via INTRAVENOUS
  Filled 2019-05-16 (×2): qty 1

## 2019-05-16 MED ORDER — LOSARTAN POTASSIUM 50 MG PO TABS
50.0000 mg | ORAL_TABLET | Freq: Every day | ORAL | Status: DC
  Administered 2019-05-16: 50 mg via ORAL
  Filled 2019-05-16: qty 1

## 2019-05-16 MED ORDER — POTASSIUM CHLORIDE 10 MEQ/100ML IV SOLN
10.0000 meq | INTRAVENOUS | Status: AC
  Administered 2019-05-16 (×4): 10 meq via INTRAVENOUS
  Filled 2019-05-16 (×4): qty 100

## 2019-05-16 MED ORDER — FUROSEMIDE 10 MG/ML IJ SOLN
20.0000 mg | Freq: Once | INTRAMUSCULAR | Status: AC
  Administered 2019-05-16: 20 mg via INTRAVENOUS
  Filled 2019-05-16: qty 2

## 2019-05-16 MED ORDER — MAGNESIUM SULFATE 50 % IJ SOLN
3.0000 g | Freq: Once | INTRAVENOUS | Status: AC
  Administered 2019-05-16: 12:00:00 3 g via INTRAVENOUS
  Filled 2019-05-16: qty 6

## 2019-05-16 MED ORDER — METOPROLOL TARTRATE 5 MG/5ML IV SOLN: 2.5000 mg | INTRAVENOUS | Status: DC | PRN

## 2019-05-16 NOTE — Progress Notes (Signed)
[facility] for Heparin >Bivalirudin Indication: atrial fibrillation  Allergies  Allergen Reactions  . Heparin     R/o HIT 3/3    Patient Measurements: Height: 5\' 9"  (175.3 cm) Weight: 128 lb 12 oz (58.4 kg) IBW/kg (Calculated) : 70.7   Vital Signs: Temp: 97.5 F (36.4 C) (03/07 0434) Temp Source: Axillary (03/07 0434) BP: 130/94 (03/07 0434) Pulse Rate: 93 (03/07 0214)  Labs: Recent Labs    05/14/19 0442 05/14/19 0442 05/15/19 0444 05/16/19 0456  HGB 11.8*   < > 12.3* 11.3*  HCT 35.2*  --  37.9* 34.3*  PLT 21*  --  27* 76*  APTT 67*  --  52* 57*  CREATININE 1.28*  --  1.09 1.19   < > = values in this interval not displayed.    Estimated Creatinine Clearance: 55.2 mL/min (by C-G formula based on SCr of 1.19 mg/dL).  Assessment: 59 year old M w/ new aFlutter (CHADS2VASc = 0-1 for possible HTN), started on apixaban 2/24 and 2/25 transitioned back to heparin for cath. Apixaban last dose 2/25 9:30. Will follow APTT until correlates with HL. 2/25 TEE/DCCV converted patient to NSR.   He is now on bivalirudin for possible HIT (heparin antibody negative; SRA pending) -aPTT at goal -platelets= 21>>76  Goal of Therapy:  APTT 50-80 sec Monitor platelets by anticoagulation protocol: Yes   Plan:  Continue bivalirudin at 0.11mg /kg/hr Monitor daily aPTT, CBC, platelets F/U HIT SRA  Hildred Laser, PharmD Clinical Pharmacist **Pharmacist phone directory can now be found on [email or web address] ([email or web address]).  Listed under [place].

## 2019-05-16 NOTE — Progress Notes (Signed)
During shift change pt agitated, trying to pull at lines/clothing/monitor attempting to leave. Pt was able to be calmed and assisted back to bed after using bedside commode. At 2200 pt again awoke from sleeping, very adamant that he was leaving and going home-becoming more agitated at staff for trying to keep gown, telemetry and lines from being pulled out. Pt states he "does not want treatment and wants to go home NOW". Pt oriented to self only, unable to redirect/calm. CIWA scored 12. Ativan 2mg  IV given per PRN order. Pt was also given scheduled Haldol. Will continue to monitor. Jessie Foot, RN

## 2019-05-16 NOTE — Progress Notes (Addendum)
Progress Note  Patient Name: Austin Cobb Date of Encounter: 05/16/2019  Primary Cardiologist: Donato Heinz, MD   Subjective   Some fatigue this AM  Inpatient Medications    Scheduled Meds: . sodium chloride   Intravenous Once  . Chlorhexidine Gluconate Cloth  6 each Topical Daily  . digoxin  0.125 mg Oral Daily  . feeding supplement (ENSURE ENLIVE)  237 mL Oral 4x daily  . fluconazole  200 mg Oral Daily  . folic acid  1 mg Intravenous Daily  . haloperidol lactate  1 mg Intravenous BID  . lactulose  20 g Oral BID   Or  . lactulose  300 mL Rectal BID  . LORazepam  0-4 mg Intravenous Q4H   Followed by  . LORazepam  0-4 mg Intravenous Q8H  . losartan  25 mg Oral Daily  . multivitamin with minerals  1 tablet Oral Daily  . nicotine  21 mg Transdermal Daily  . sodium chloride flush  10-40 mL Intracatheter Q12H  . sodium chloride flush  3 mL Intravenous Q12H  . sodium chloride flush  3 mL Intravenous Q12H  . spironolactone  12.5 mg Oral Daily  . thiamine  100 mg Oral Daily   Or  . thiamine  100 mg Intravenous Daily   Continuous Infusions: . sodium chloride    . sodium chloride 10 mL/hr at 05/13/19 1514  . amiodarone 30 mg/hr (05/16/19 0600)  . bivalirudin (ANGIOMAX) infusion 0.5 mg/mL (Non-ACS indications) 0.11 mg/kg/hr (05/16/19 0600)   PRN Meds: sodium chloride, hydrALAZINE, LORazepam **OR** LORazepam, metoprolol tartrate, sodium chloride flush, sodium chloride flush   Vital Signs    Vitals:   05/16/19 0111 05/16/19 0214 05/16/19 0434 05/16/19 0731  BP: (!) 138/97 (!) 141/116 (!) 130/94   Pulse: 73 93    Resp:  18 18 18   Temp:  98 F (36.7 C) (!) 97.5 F (36.4 C)   TempSrc:  Oral Axillary   SpO2:  100% 100%   Weight:   58.4 kg   Height:        Intake/Output Summary (Last 24 hours) at 05/16/2019 0829 Last data filed at 05/16/2019 [id number] Gross per 24 hour  Intake 2044.02 ml  Output 2400 ml  Net -355.98 ml   Last 3 Weights 05/16/2019 05/15/2019  05/14/2019  Weight (lbs) 128 lb 12 oz 128 lb 1.4 oz 135 lb 12.9 oz  Weight (kg) 58.4 kg 58.1 kg 61.6 kg      Telemetry    Aflutter low 90s to low 100s - Personally Reviewed  ECG    n/a - Personally Reviewed  Physical Exam   GEN: No acute distress.   Neck: No JVD Cardiac: irregu, 2/6 systolic murmur apex, rubs, or gallops.  Respiratory: Clear to auscultation bilaterally. GI: Soft, nontender, non-distended  MS: No edema; No deformity. Neuro:  Nonfocal  Psych: Normal affect   Labs    High Sensitivity Troponin:   Recent Labs  Lab 05/04/19 1540 05/04/19 1719  TROPONINIHS 48* 50*      Chemistry Recent Labs  Lab 05/14/19 0442 05/15/19 0444 05/16/19 0456  NA 139 139 140  K 3.8 4.2 3.7  CL 103 107 103  CO2 26 24 27   GLUCOSE 136* 98 108*  BUN 15 12 10   CREATININE 1.28* 1.09 1.19  CALCIUM 8.4* 8.7* 8.5*  PROT 5.6* 6.1* 6.1*  ALBUMIN 2.7* 3.0* 3.0*  AST 73* 59* 49*  ALT 479* 431* 318*  ALKPHOS 75 77 74  BILITOT  2.0* 1.5* 1.0  GFRNONAA >60 >60 >60  GFRAA >60 >60 >60  ANIONGAP 10 8 10      Hematology Recent Labs  Lab 05/14/19 0442 05/15/19 0444 05/16/19 0456  WBC 5.9 7.1 6.6  RBC 3.85* 4.09* 3.71*  HGB 11.8* 12.3* 11.3*  HCT 35.2* 37.9* 34.3*  MCV 91.4 92.7 92.5  MCH 30.6 30.1 30.5  MCHC 33.5 32.5 32.9  RDW 15.1 15.5 15.4  PLT 21* 27* 76*    BNPNo results for input(s): BNP, PROBNP in the last 168 hours.   DDimer  Recent Labs  Lab 05/14/19 0800 05/15/19 0444 05/16/19 0456  DDIMER 0.81* 0.92* 1.16*     Radiology    No results found.  Cardiac Studies     Patient Profile     59 y.o. male Austin Cobb seen today for evaluation of new biventricular heart failureat the request of Dr. Gardiner Rhyme,    Assessment & Plan    1. Acute systolic HF/Biventricular failure - 04/2019 echo LVEF 10-15%, severe RV dysfunction, mild to mod MR, dilated IVC - new diagnosis this admission, no prior cardiac history - possible etiologies included  tachy medicated, EtOH, ischemic - eventual cath once thrombocytopenia has resolved  - low coox initially, started on milrinone transiently, now off.  - COOX up and down, last 3 days 66, 89, 56. Monitor for consistent downtrend. CVP 6-8. - does not appear significantly volume overlaoded on exam, continue to hold diuretic today.    - medical therapy with digoxin 0.125, losartan 25, aldactone 12.5. no beta blocker due to initially low output.  - bps remain elevated, increase losartan to 50mg  daily.     2. AMS/Deleirum - possible EtOH withdrawal, hepatic encephalopathy   3. 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 He did receive a transfusion yesterday by primary team  4. Aflutter - had DCCV but recurrence of aflutter - currently rate controlled flutter - on amio gtt and oral digoxin, plan for repeat DCCV after amio has loaded though will also need thrombocytopenia to resolve. Continue amio gtt until he either self converts or able to perform his DCCV.  - on bivalirudin due to HIT + - rates low 100s   Primary CHF team to resume rounds tomorrow. Patient npo tonight incase decided to pursue either DCCV or cath pending platelets tomorrow. His mental status is much improved this AM.    For questions or updates, please contact [place] Please consult [email or web address] for contact info under        Signed, Carlyle Dolly, MD  05/16/2019, 8:29 AM

## 2019-05-16 NOTE — Progress Notes (Signed)
PROGRESS NOTE    Austin Cobb  [id number] DOB: 05-Oct-1960 DOA: 05/04/2019 PCP: Patient, No Pcp Per     Brief Narrative:  59 year old BM PMHx Bipolar disorder, Anxiety and Depression tobacco, EtOH and polysubstance abuse,    Presenting with progressive dyspnea for 1 day and exertional dyspnea and fatigue for over 2 months.  Also had paroxysmal nocturnal dyspnea.   In ED, found to be in A.  Flutter with RVR with HR in the 130s.  Also hypertensive with SBP in 170s and DBP in 130s.  UDS negative.  Mildly elevated AST and ALT.BNP 2000.  High-sensitivity troponin 48> 50.  EKG revealed a flutter and LVH.  Lactic acid 2.1> 1.7.  Hgb 11.7.  UA with large LE and many bacteria.  POC COVID-19 test negative.  Cardiology consulted.  Patient admitted for atrial flutter with RVR and unknown type acute CHF.   D-dimer elevated.  CTA chest negative for PE.  Echo with EF of 10 to 15%, global hypokinesis, unknown diastolic function, severely reduced RVEF and severely enlarged RV with RVSP of 28.2 mmHg.   Patient underwent TEE guided cardioversion on 05/06/2019 and converted to NSR. TEE with EF of 10%, moderate MR, no thrombus or significant ASD or PFO.   Advanced heart failure team involved.  Started high-dose IV Lasix but renal function gotten worse.   Subjective: 3/7 somnolent    Assessment & Plan:   Active Problems:   Atrial flutter with rapid ventricular response (HCC)   Acute heart failure (HCC)   Essential hypertension   Acute on chronic combined systolic and diastolic CHF (congestive heart failure) (HCC)   Alcohol withdrawal (HCC)   Acute metabolic encephalopathy   AKI (acute kidney injury) ([place])   Elevated d-dimer   Demand ischemia (HCC)   Elevated troponin   Pleural effusion   Polysubstance abuse (HCC)   E. coli UTI   Agitation   Noncompliance   Yeast UTI  Acute systolic CHF -Strict in and out +4.0 L -Daily weight Filed Weights   05/14/19 0504 05/15/19 0429 05/16/19  0434  Weight: 61.6 kg 58.1 kg 58.4 kg  -Cardiology consulted -Amiodarone drip per cardiology -Digoxin 0.125 mg daily -Hydralazine PRN -Aldactone 12.5 mg daily -Milrinone drip per cardiology (held) -Metoprolol 2.5 mg PRN -3/6 Lasix IV 20 mg post transfusion of platelets -3/7 Lasix IV 20 mg posttransfusion platelets -3/7 discussed case with Dr. Carlyle Dolly cardiology platelets will need to be> 100 in order to perform cardiac catheterization/DCCV  A flutter with RVR - CHA2DS2-VASc score of 2. -Converted to NSR but returned to A. fib on 2/27 -See CHF -Repeat DCCV once acute encephalopathy resolved and platelets recover -Bivalirudin secondary to being HIT positive  EtOH withdrawal/Acute Metabolic Encephalopathy -On admission did not obtain alcohol level -Urine tox screen negative -CIWA protocol.  NOTE Ativan substituted for Librium secondary to increasing thrombocytopenia -3/4 appears to be improving, mittens now off -3/5 Recent patient became agitated attempted to leave AMA, pulling out IV lines. -3/5 Restarted CIWA protocol IV, Haldol scheduled  Acute kidney injury (baseline Cr 1.1) Recent Labs  Lab 05/12/19 0519 05/13/19 0452 05/14/19 0442 05/15/19 0444 05/16/19 0456  CREATININE 0.88 1.05 1.28* 1.09 1.19  -Baseline -Hold diuretics -Hold nephrotoxic medication -Monitor closely  Elevated D-dimer Results for NIKESH, BUSBY (MRN [id number]) as of 05/14/2019 07:18  Ref. Range 05/12/2016 11:04 05/04/2019 21:44  D-Dimer, America Brown Latest Ref Range: 0.00 - 0.50 ug/mL-FEU 1.33 (H) 1.54 (H)  -Trend -CTA negative for PE, moderate bilateral pleural  effusion see below  Elevated troponin/Demand ischemia Results for KEYWON, TKACHENKO (MRN [id number]) as of 05/14/2019 07:18  Ref. Range 05/04/2019 15:40 05/04/2019 17:19  Troponin I (High Sensitivity) Latest Ref Range: <18 ng/L 48 (H) 50 (H)  -Most likely demand ischemia from a flutter and CHF  Essential HTN -See CHF  Elevated  liver enzymes  -Most likely secondary to EtOH abuse, CHF, and Amiodarone -Elevated liver enzymes: Likely due to alcohol, CHF and possibly due to amiodarone.   -Acute hepatitis panel and HIV negative. -3/6 liver enzymes improving   Bilateral pleural effusion -3/5 PCXR; improving aeration see results below  UTI positive E. Coli/ UTI positive yeast -Appears to have completed course of antibiotics -3/5 repeat urine culture positive yeast -3/6 Fluconazole for 2 weeks  Polysubstance abuse -2/23 urine tox screen negative -Cessation counseling when able -Consult CSW for resources -Continue CIWA protocol  Agitation -Haldol 1 mg BID.  May need to titrate up  Tobacco abuse -Smokes 1 PPD -Cessation encouraged. -Nicotine patch   Generalized weakness/debility -PT/OT eval  Euthyroid sick syndrome:  -TSH 5.4.  Free T4 1.68 -Repeat thyroid panel in 4 to 6 weeks outpatient.  Hypomagnesmia -Magnesium goal> 2 -Magnesium IV 3 g  Hypokalemia -Potassium goal>4 -Potassium IV 40 mEq  Thrombopenia Results for HARVIN, MCCULLY (MRN [id number]) as of 05/16/2019 09:18  Ref. Range 05/13/2019 04:52 05/13/2019 11:42 05/14/2019 04:42 05/15/2019 04:44 05/16/2019 04:56  Platelets Latest Ref Range: 150 - 400 K/uL 23 (LL) 21 (LL) 21 (LL) 27 (LL) 76 (L)  -Avoid all heparin products -Positive HIT antibody positive -3/5 type and cross, if platelets fall any further or any signs of bleeding will transfuse immediately -3/6 appears platelets may be starting to trend up, however will take significant amount of time to recover to point where cardiology can perform multiple invasive procedures.. -3/6 transfuse 1 unit platelets -3/7 transfuse 1 unit platelets  Noncompliance -Patient requested to leave AMA.  Counseled patient that if he attempted to leave given his poor cardiac condition he would DIE before reaching home, at that point patient changed his mind and requested that we help him.    DVT prophylaxis:  Bivalirudin  Code Status: Full Family Communication: 3/6 spoke with Mr Evalina Field who is listed as patient's POC and per patient makes his healthcare decisions.  Explained need for transfusion of platelets in order to allow cardiologist to perform procedures to determine definitive treatment for patient Mr Baucino agreed to procedure. Disposition Plan: TBD per cardiology   Consultants:  Cardiology   Procedures/Significant Events:  2/24 CTA chest PE protocol; similar: Negative for PE-moderate bilateral pleural effusions RIGHT>>> LEFT 2/25 Echocardiogram;LVEF; <20%. The left-ventricle has severely decreased function.  -The left ventricle demonstrates global hypokinesis.  -Left ventricular internal cavity size was mildly to moderately dilated.  -Mitral valve Mild to moderate regurgitation.  -3/5 CXR;-cardiomegaly with improved aeration of the lungs and resolved pleural effusions -3/6 transfuse 1 unit platelets -3/7 transfuse 1 unit platelets  I have personally reviewed and interpreted all radiology studies and my findings are as above.  VENTILATOR SETTINGS:    Cultures 2/23 HIV negative 2/23 urine positive E. coli 2/24 SARS coronavirus 2 negative 2/24 acute hepatitis panel negative 3/5 urine pending     Antimicrobials: Anti-infectives (From admission, onward)   Start     Dose/Rate Stop   05/08/19 1400  amoxicillin (AMOXIL) capsule 500 mg     500 mg 05/09/19 2159   05/05/19 1900  cefTRIAXone (ROCEPHIN) 1 g in sodium chloride 0.9 %  100 mL IVPB  Status:  Discontinued     1 g 200 mL/hr over 30 Minutes 05/08/19 0751   05/04/19 1730  cefTRIAXone (ROCEPHIN) 1 g in sodium chloride 0.9 % 100 mL IVPB     1 g 200 mL/hr over 30 Minutes 05/04/19 2005       Devices    LINES / TUBES:      Continuous Infusions: . sodium chloride    . sodium chloride 10 mL/hr at 05/13/19 1514  . amiodarone 30 mg/hr (05/16/19 0600)  . bivalirudin (ANGIOMAX) infusion 0.5 mg/mL (Non-ACS  indications) 0.11 mg/kg/hr (05/16/19 0600)     Objective: Vitals:   05/16/19 0111 05/16/19 0214 05/16/19 0434 05/16/19 0731  BP: (!) 138/97 (!) 141/116 (!) 130/94   Pulse: 73 93    Resp:  18 18 18   Temp:  98 F (36.7 C) (!) 97.5 F (36.4 C)   TempSrc:  Oral Axillary   SpO2:  100% 100%   Weight:   58.4 kg   Height:        Intake/Output Summary (Last 24 hours) at 05/16/2019 [id number] Last data filed at 05/16/2019 [id number] Gross per 24 hour  Intake 2044.02 ml  Output 2400 ml  Net -355.98 ml   Filed Weights   05/14/19 0504 05/15/19 0429 05/16/19 0434  Weight: 61.6 kg 58.1 kg 58.4 kg   Physical Exam:  General: Somnolent, no acute respiratory distress Eyes: negative scleral hemorrhage, negative anisocoria, negative icterus ENT: Negative Runny nose, negative gingival bleeding, Neck:  Negative scars, masses, torticollis, lymphadenopathy, JVD Lungs: Clear to auscultation bilaterally without wheezes or crackles Cardiovascular: Regular rate and rhythm without murmur gallop or rub normal S1 and S2 Abdomen: negative abdominal pain, nondistended, positive soft, bowel sounds, no rebound, no ascites, no appreciable mass Extremities: No significant cyanosis, clubbing, or edema bilateral lower extremities Skin: Negative rashes, lesions, ulcers Psychiatric: Unable to evaluate secondary to patient's somnolence Central nervous system: Unable to evaluate secondary to patient's somnolence       Data Reviewed: Care during the described time interval was provided by me .  I have reviewed this patient's available data, including medical history, events of note, physical examination, and all test results as part of my evaluation.  CBC: Recent Labs  Lab 05/13/19 0452 05/13/19 1142 05/14/19 0442 05/15/19 0444 05/16/19 0456  WBC 6.2 6.4 5.9 7.1 6.6  HGB 13.0 11.9* 11.8* 12.3* 11.3*  HCT 39.0 35.3* 35.2* 37.9* 34.3*  MCV 92.4 91.2 91.4 92.7 92.5  PLT 23* 21* 21* 27* 76*   Basic Metabolic  Panel: Recent Labs  Lab 05/10/19 0525 05/11/19 0527 05/12/19 0519 05/13/19 0452 05/14/19 0442 05/15/19 0444 05/16/19 0456  NA 135   < > 135 135 139 139 140  K 3.4*   < > 3.9 4.0 3.8 4.2 3.7  CL 101   < > 101 104 103 107 103  CO2 22   < > 23 24 26 24 27   GLUCOSE 209*   < > 132* 89 136* 98 108*  BUN 9   < > 6 9 15 12 10   CREATININE 0.84   < > 0.88 1.05 1.28* 1.09 1.19  CALCIUM 8.3*   < > 8.6* 8.2* 8.4* 8.7* 8.5*  MG 1.7   < > 1.5* 2.0 1.9 1.7 1.7  PHOS 2.7  --   --  3.7 4.4 4.1 5.0*   < > = values in this interval not displayed.   GFR: Estimated Creatinine Clearance: 55.2 mL/min (by C-G  formula based on SCr of 1.19 mg/dL). Liver Function Tests: Recent Labs  Lab 05/10/19 0525 05/13/19 0452 05/14/19 0442 05/15/19 0444 05/16/19 0456  AST  --  111* 73* 59* 49*  ALT  --  650* 479* 431* 318*  ALKPHOS  --  76 75 77 74  BILITOT  --  2.6* 2.0* 1.5* 1.0  PROT  --  5.6* 5.6* 6.1* 6.1*  ALBUMIN 2.8* 2.7* 2.7* 3.0* 3.0*   No results for input(s): LIPASE, AMYLASE in the last 168 hours. Recent Labs  Lab 05/10/19 2119 05/14/19 0802 05/15/19 0441 05/16/19 0456  AMMONIA 29 24 21 19    Coagulation Profile: No results for input(s): INR, PROTIME in the last 168 hours. Cardiac Enzymes: No results for input(s): CKTOTAL, CKMB, CKMBINDEX, TROPONINI in the last 168 hours. BNP (last 3 results) No results for input(s): PROBNP in the last 8760 hours. HbA1C: No results for input(s): HGBA1C in the last 72 hours. CBG: Recent Labs  Lab 05/10/19 2149  GLUCAP 103*   Lipid Profile: No results for input(s): CHOL, HDL, LDLCALC, TRIG, CHOLHDL, LDLDIRECT in the last 72 hours. Thyroid Function Tests: No results for input(s): TSH, T4TOTAL, FREET4, T3FREE, THYROIDAB in the last 72 hours. Anemia Panel: No results for input(s): VITAMINB12, FOLATE, FERRITIN, TIBC, IRON, RETICCTPCT in the last 72 hours. Sepsis Labs: No results for input(s): PROCALCITON, LATICACIDVEN in the last 168  hours.  Recent Results (from the past 240 hour(s))  Culture, Urine     Status: Abnormal   Collection Time: 05/14/19  7:49 AM   Specimen: Urine, Random  Result Value Ref Range Status   Specimen Description URINE, RANDOM  Final   Special Requests   Final    NONE Performed at [facility], [address] [place],  [address]    Culture >=100,000 COLONIES/mL YEAST (A)  Final   Report Status 05/15/2019 FINAL  Final         Radiology Studies: No results found.      Scheduled Meds: . sodium chloride   Intravenous Once  . Chlorhexidine Gluconate Cloth  6 each Topical Daily  . digoxin  0.125 mg Oral Daily  . feeding supplement (ENSURE ENLIVE)  237 mL Oral 4x daily  . fluconazole  200 mg Oral Daily  . folic acid  1 mg Intravenous Daily  . haloperidol lactate  1 mg Intravenous BID  . lactulose  20 g Oral BID   Or  . lactulose  300 mL Rectal BID  . LORazepam  0-4 mg Intravenous Q4H   Followed by  . LORazepam  0-4 mg Intravenous Q8H  . losartan  50 mg Oral Daily  . multivitamin with minerals  1 tablet Oral Daily  . nicotine  21 mg Transdermal Daily  . sodium chloride flush  10-40 mL Intracatheter Q12H  . sodium chloride flush  3 mL Intravenous Q12H  . sodium chloride flush  3 mL Intravenous Q12H  . spironolactone  12.5 mg Oral Daily  . thiamine  100 mg Oral Daily   Or  . thiamine  100 mg Intravenous Daily   Continuous Infusions: . sodium chloride    . sodium chloride 10 mL/hr at 05/13/19 1514  . amiodarone 30 mg/hr (05/16/19 0600)  . bivalirudin (ANGIOMAX) infusion 0.5 mg/mL (Non-ACS indications) 0.11 mg/kg/hr (05/16/19 0600)     LOS: 12 days    Time spent:40 min    Dempsey Ahonen, Geraldo Docker, MD [facility] Pager [phone]  If 7PM-7AM, please contact night-coverage [email or web address] Password  [email or web address] 05/16/2019, 9:18 AM

## 2019-05-16 NOTE — Plan of Care (Signed)
Plan of care reviewed with pt. Pt confused, but reoriented. BP elevated, HR tachy in aflutter, MD aware. Angiomax and Amio infusing per orders. Condom cath in place. PICC in place. Pt stable at this time, will continue to monitor.  Problem: Education: Goal: Knowledge of General Education information will improve Description: Including pain rating scale, medication(s)/side effects and non-pharmacologic comfort measures Outcome: Progressing   Problem: Health Behavior/Discharge Planning: Goal: Ability to manage health-related needs will improve Outcome: Progressing   Problem: Clinical Measurements: Goal: Ability to maintain clinical measurements within normal limits will improve Outcome: Progressing Goal: Will remain free from infection Outcome: Progressing Goal: Diagnostic test results will improve Outcome: Progressing Goal: Respiratory complications will improve Outcome: Progressing Goal: Cardiovascular complication will be avoided Outcome: Progressing   Problem: Activity: Goal: Risk for activity intolerance will decrease Outcome: Progressing   Problem: Nutrition: Goal: Adequate nutrition will be maintained Outcome: Progressing   Problem: Coping: Goal: Level of anxiety will decrease Outcome: Progressing   Problem: Elimination: Goal: Will not experience complications related to bowel motility Outcome: Progressing Goal: Will not experience complications related to urinary retention Outcome: Progressing   Problem: Pain Managment: Goal: General experience of comfort will improve Outcome: Progressing   Problem: Safety: Goal: Ability to remain free from injury will improve Outcome: Progressing   Problem: Skin Integrity: Goal: Risk for impaired skin integrity will decrease Outcome: Progressing   Problem: Education: Goal: Ability to demonstrate management of disease process will improve Outcome: Progressing Goal: Ability to verbalize understanding of medication  therapies will improve Outcome: Progressing Goal: Individualized Educational Video(s) Outcome: Progressing   Problem: Activity: Goal: Capacity to carry out activities will improve Outcome: Progressing   Problem: Cardiac: Goal: Ability to achieve and maintain adequate cardiopulmonary perfusion will improve Outcome: Progressing   Problem: Education: Goal: Understanding of CV disease, CV risk reduction, and recovery process will improve Outcome: Progressing Goal: Individualized Educational Video(s) Outcome: Progressing   Problem: Activity: Goal: Ability to return to baseline activity level will improve Outcome: Progressing   Problem: Cardiovascular: Goal: Ability to achieve and maintain adequate cardiovascular perfusion will improve Outcome: Progressing Goal: Vascular access site(s) Level 0-1 will be maintained Outcome: Progressing   Problem: Health Behavior/Discharge Planning: Goal: Ability to safely manage health-related needs after discharge will improve Outcome: Progressing

## 2019-05-17 ENCOUNTER — Other Ambulatory Visit (HOSPITAL_COMMUNITY): Payer: Self-pay

## 2019-05-17 DIAGNOSIS — I484 Atypical atrial flutter: Secondary | ICD-10-CM

## 2019-05-17 DIAGNOSIS — I5032 Chronic diastolic (congestive) heart failure: Secondary | ICD-10-CM

## 2019-05-17 LAB — PREPARE PLATELET PHERESIS: Unit division: 0

## 2019-05-17 LAB — AMMONIA: Ammonia: 18 umol/L (ref 9–35)

## 2019-05-17 LAB — COMPREHENSIVE METABOLIC PANEL
ALT: 249 U/L — ABNORMAL HIGH (ref 0–44)
AST: 43 U/L — ABNORMAL HIGH (ref 15–41)
Albumin: 3.1 g/dL — ABNORMAL LOW (ref 3.5–5.0)
Alkaline Phosphatase: 71 U/L (ref 38–126)
Anion gap: 9 (ref 5–15)
BUN: 12 mg/dL (ref 6–20)
CO2: 28 mmol/L (ref 22–32)
Calcium: 8.8 mg/dL — ABNORMAL LOW (ref 8.9–10.3)
Chloride: 100 mmol/L (ref 98–111)
Creatinine, Ser: 1.11 mg/dL (ref 0.61–1.24)
GFR calc Af Amer: >60 mL/min (ref >60)
GFR calc non Af Amer: >60 mL/min (ref >60)
Glucose, Bld: 84 mg/dL (ref 70–99)
Potassium: 4.4 mmol/L (ref 3.5–5.1)
Sodium: 137 mmol/L (ref 135–145)
Total Bilirubin: 0.7 mg/dL (ref 0.3–1.2)
Total Protein: 6.3 g/dL — ABNORMAL LOW (ref 6.5–8.1)

## 2019-05-17 LAB — SEROTONIN RELEASE ASSAY (SRA)
SRA .2 IU/mL UFH Ser-aCnc: 6 % (ref 0–20)
SRA 100IU/mL UFH Ser-aCnc: 1 % (ref 0–20)

## 2019-05-17 LAB — CBC
HCT: 34.1 % — ABNORMAL LOW (ref 39.0–52.0)
Hemoglobin: 11.3 g/dL — ABNORMAL LOW (ref 13.0–17.0)
MCH: 30.2 pg (ref 26.0–34.0)
MCHC: 33.1 g/dL (ref 30.0–36.0)
MCV: 91.2 fL (ref 80.0–100.0)
Platelets: 99 10*3/uL — ABNORMAL LOW (ref 150–400)
RBC: 3.74 MIL/uL — ABNORMAL LOW (ref 4.22–5.81)
RDW: 14.8 % (ref 11.5–15.5)
WBC: 8.2 10*3/uL (ref 4.0–10.5)
nRBC: 0 % (ref 0.0–0.2)

## 2019-05-17 LAB — COOXEMETRY PANEL
Carboxyhemoglobin: 1.3 % (ref 0.5–1.5)
Methemoglobin: 1 % (ref 0.0–1.5)
O2 Saturation: 58.9 %
Total hemoglobin: 11.5 g/dL — ABNORMAL LOW (ref 12.0–16.0)

## 2019-05-17 LAB — BPAM PLATELET PHERESIS
Blood Product Expiration Date: 202103072359
ISSUE DATE / TIME: 202103071425
Unit Type and Rh: 7300

## 2019-05-17 LAB — PHOSPHORUS: Phosphorus: 4.7 mg/dL — ABNORMAL HIGH (ref 2.5–4.6)

## 2019-05-17 LAB — APTT: aPTT: 59 seconds — ABNORMAL HIGH (ref 24–36)

## 2019-05-17 LAB — MAGNESIUM: Magnesium: 1.9 mg/dL (ref 1.7–2.4)

## 2019-05-17 LAB — D-DIMER, QUANTITATIVE: D-Dimer, Quant: 0.82 ug/mL-FEU — ABNORMAL HIGH (ref 0.00–0.50)

## 2019-05-17 MED ORDER — SODIUM CHLORIDE 0.9% FLUSH: 3.0000 mL | INTRAVENOUS | Status: DC | PRN

## 2019-05-17 MED ORDER — AMIODARONE HCL 200 MG PO TABS
200.0000 mg | ORAL_TABLET | Freq: Two times a day (BID) | ORAL | Status: DC
  Administered 2019-05-17 – 2019-05-26 (×19): 200 mg via ORAL
  Filled 2019-05-17 (×19): qty 1

## 2019-05-17 MED ORDER — SODIUM CHLORIDE 0.9 % IV SOLN: 250.0000 mL | INTRAVENOUS | Status: DC | PRN

## 2019-05-17 MED ORDER — SPIRONOLACTONE 25 MG PO TABS
25.0000 mg | ORAL_TABLET | Freq: Every day | ORAL | Status: DC
  Administered 2019-05-17 – 2019-05-26 (×10): 25 mg via ORAL
  Filled 2019-05-17 (×10): qty 1

## 2019-05-17 MED ORDER — APIXABAN 5 MG PO TABS
5.0000 mg | ORAL_TABLET | Freq: Two times a day (BID) | ORAL | Status: DC
  Administered 2019-05-17: 5 mg via ORAL
  Filled 2019-05-17: qty 1

## 2019-05-17 MED ORDER — HALOPERIDOL LACTATE 5 MG/ML IJ SOLN
1.0000 mg | Freq: Three times a day (TID) | INTRAMUSCULAR | Status: DC
  Administered 2019-05-17 – 2019-05-26 (×26): 1 mg via INTRAVENOUS
  Filled 2019-05-17 (×25): qty 1

## 2019-05-17 MED ORDER — SACUBITRIL-VALSARTAN 24-26 MG PO TABS
1.0000 | ORAL_TABLET | Freq: Two times a day (BID) | ORAL | Status: DC
  Administered 2019-05-17 (×2): 1 via ORAL
  Filled 2019-05-17 (×3): qty 1

## 2019-05-17 MED ORDER — SODIUM CHLORIDE 0.9% FLUSH
3.0000 mL | Freq: Two times a day (BID) | INTRAVENOUS | Status: DC
  Administered 2019-05-18 (×2): 3 mL via INTRAVENOUS

## 2019-05-17 MED ORDER — SODIUM CHLORIDE 0.9 % IV SOLN: INTRAVENOUS | Status: DC

## 2019-05-17 MED ORDER — SODIUM CHLORIDE 0.9 % IV SOLN
0.1100 mg/kg/h | INTRAVENOUS | Status: DC
  Administered 2019-05-17: 0.11 mg/kg/h via INTRAVENOUS
  Filled 2019-05-17: qty 250

## 2019-05-17 MED ORDER — LORAZEPAM 2 MG/ML IJ SOLN
1.0000 mg | INTRAMUSCULAR | Status: AC | PRN
  Administered 2019-05-17 – 2019-05-18 (×7): 2 mg via INTRAVENOUS
  Administered 2019-05-18: 1 mg via INTRAVENOUS
  Administered 2019-05-18 – 2019-05-19 (×3): 2 mg via INTRAVENOUS
  Administered 2019-05-19: 1 mg via INTRAVENOUS
  Administered 2019-05-19 – 2019-05-20 (×3): 2 mg via INTRAVENOUS
  Filled 2019-05-17 (×3): qty 1
  Filled 2019-05-17: qty 2
  Filled 2019-05-17 (×9): qty 1

## 2019-05-17 MED ORDER — LORAZEPAM 1 MG PO TABS: 1.0000 mg | ORAL_TABLET | ORAL | Status: AC | PRN

## 2019-05-17 MED ORDER — ASPIRIN 81 MG PO CHEW
81.0000 mg | CHEWABLE_TABLET | ORAL | Status: AC
  Administered 2019-05-18: 81 mg via ORAL
  Filled 2019-05-17: qty 1

## 2019-05-17 NOTE — Progress Notes (Signed)
Physical Therapy Evaluation Patient Details Name: Austin Cobb MRN: [id number] DOB: Feb 07, 1961 Today's Date: 05/17/2019   History of Present Illness  Patient is a 59 year old African-American male with past medical history significant for tobacco use disorder, substance abuse (crack and marijuana), bipolar disorder and lifelong alcoholism.  Patient's last drink was 2 days ago.  Patient presents with 1 day history of worsening shortness of breath.    Clinical Impression  Pt admitted with above. Pt with known psych history and is agitated and particular in his ways. Pt is not safe to be home alone and is at a very high risk of falling both with and without RW. Pt requiring minA and max verbal cues for safe ambulation with RW. Acute PT to cont to follow.    Follow Up Recommendations SNF;Supervision/Assistance - 24 hour    Equipment Recommendations  None recommended by PT    Recommendations for Other Services       Precautions / Restrictions Precautions Precautions: Fall Precaution Comments: pt bipolar and becomes agitated easily and is very particular Restrictions Weight Bearing Restrictions: No      Mobility  Bed Mobility Overal bed mobility: Modified Independent             General bed mobility comments: pt impulsively quickly gets in/out of bed  Transfers Overall transfer level: Needs assistance Equipment used: None Transfers: Sit to/from Stand Sit to Stand: Min guard         General transfer comment: pt impulsive and unsteady reaching for object requiring min guard for safety  Ambulation/Gait Ambulation/Gait assistance: Min assist;Mod assist Gait Distance (Feet): 150 Feet Assistive device: Rolling walker (2 wheeled);None Gait Pattern/deviations: Step-through pattern;Decreased stride length;Staggering left;Staggering right;Trunk flexed Gait velocity: dec Gait velocity interpretation: <1.31 ft/sec, indicative of household ambulator General Gait Details: pt  very unsteady requiring modA to maitnain balance as pt reaching for things to hold onto, pt with wide base of support and antalgic gait, pt given RW, pt more steady however without max verbal cues for walker management pt also very unsafe with use of walker has he doesn't stay in it and straddles the walker in which he trips over it  Stairs Stairs: (pt adamantly refused to trial stairs)          Wheelchair Mobility    Modified Rankin (Stroke Patients Only)       Balance Overall balance assessment: Needs assistance Sitting-balance support: Feet supported;No upper extremity supported Sitting balance-Leahy Scale: Good     Standing balance support: No upper extremity supported Standing balance-Leahy Scale: Poor Standing balance comment: dependent on UE support                             Pertinent Vitals/Pain Pain Assessment: Faces Faces Pain Scale: No hurt    Home Living Family/patient expects to be discharged to:: Private residence Living Arrangements: Non-relatives/Friends Available Help at Discharge: Friend(s);Available PRN/intermittently Type of Home: House Home Access: Stairs to enter   Entrance Stairs-Number of Steps: 3 Home Layout: One level Home Equipment: Walker - 2 wheels;Cane - single point Additional Comments: pt became agitated when PT was asking questions     Prior Function Level of Independence: Independent         Comments: 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, he doesn't drive, he'll call a cab if he needs a ride     Hand Dominance        Extremity/Trunk Assessment  Upper Extremity Assessment Upper Extremity Assessment: Overall WFL for tasks assessed    Lower Extremity Assessment Lower Extremity Assessment: Generalized weakness    Cervical / Trunk Assessment Cervical / Trunk Assessment: Kyphotic  Communication   Communication: No difficulties  Cognition Arousal/Alertness: Awake/alert Behavior During  Therapy: Anxious;Agitated Overall Cognitive Status: History of cognitive impairments - at baseline Area of Impairment: Following commands;Attention;Safety/judgement;Awareness;Problem solving                   Current Attention Level: Focused   Following Commands: Follows one step commands with increased time;Follows one step commands inconsistently Safety/Judgement: Decreased awareness of safety;Decreased awareness of deficits Awareness: Intellectual Problem Solving: Requires verbal cues;Requires tactile cues General Comments: pt bleeding from his penis and will not change pants or put protection on despite education and max encouragement, pt unable to follow commands and use RW properly      General Comments General comments (skin integrity, edema, etc.): pt very agitated about RN and PT trying to help him don underwaear and change pants as they were soiled and ultimately refused to do so, HR resting in low 100s and inc to 110-113 during amb    Exercises     Assessment/Plan    PT Assessment Patient needs continued PT services  PT Problem List Decreased strength;Decreased range of motion;Decreased activity tolerance;Decreased balance;Decreased mobility;Decreased coordination;Decreased cognition;Decreased knowledge of use of DME;Decreased safety awareness       PT Treatment Interventions DME instruction;Gait training;Stair training;Functional mobility training;Therapeutic activities;Therapeutic exercise;Balance training;Neuromuscular re-education    PT Goals (Current goals can be found in the Care Plan section)  Acute Rehab PT Goals Patient Stated Goal: home PT Goal Formulation: With patient Time For Goal Achievement: 05/31/19 Potential to Achieve Goals: Good    Frequency Min 3X/week   Barriers to discharge Decreased caregiver support doesn't have 24/7 assist    Co-evaluation               AM-PAC PT "6 Clicks" Mobility  Outcome Measure Help needed turning  from your back to your side while in a flat bed without using bedrails?: None Help needed moving from lying on your back to sitting on the side of a flat bed without using bedrails?: None Help needed moving to and from a bed to a chair (including a wheelchair)?: None Help needed standing up from a chair using your arms (e.g., wheelchair or bedside chair)?: A Little Help needed to walk in hospital room?: A Lot Help needed climbing 3-5 steps with a railing? : A Lot 6 Click Score: 19    End of Session Equipment Utilized During Treatment: Gait belt Activity Tolerance: Treatment limited secondary to agitation Patient left: in bed;with call bell/phone within reach;with bed alarm set;with nursing/sitter in room Nurse Communication: Mobility status PT Visit Diagnosis: Unsteadiness on feet (R26.81);Other abnormalities of gait and mobility (R26.89);Repeated falls (R29.6);Muscle weakness (generalized) (M62.81);History of falling (Z91.81);Difficulty in walking, not elsewhere classified (R26.2)    Time: [id number] PT Time Calculation (min) (ACUTE ONLY): 20 min   Charges:   PT Evaluation $PT Eval Moderate Complexity: 1 Mod          Kittie Plater, PT, DPT Acute Rehabilitation Services Pager #: [phone] Office #: [phone]   Berline Lopes 05/17/2019, 2:20 PM

## 2019-05-17 NOTE — Progress Notes (Signed)
Upon shift change-blood tinged urine noted in condom cath bag. Prior shift RN stated pt had "sat on his penis" and caused the blood tinged urine in the bag. has progressed to frank blood in condom cath bag. Pt denies any discomfort or inability to urinate. VS Stable. Provider on call  Bodenheimer PA notified via [place]. Will continue to monitor. Jessie Foot

## 2019-05-17 NOTE — Progress Notes (Signed)
[place] for Heparin >Bivalirudin > apixaban >bivalirudin Indication: atrial fibrillation  Allergies  Allergen Reactions  . Heparin     R/o HIT 3/3    Patient Measurements: Height: 5\' 9"  (175.3 cm) Weight: 130 lb 15.3 oz (59.4 kg) IBW/kg (Calculated) : 70.7   Vital Signs: Temp: 98 F (36.7 C) (03/08 0742) Temp Source: Oral (03/08 0742) BP: 167/114 (03/08 0652) Pulse Rate: 106 (03/08 0742)  Labs: Recent Labs    05/15/19 0444 05/15/19 0444 05/16/19 0456 05/17/19 0500 05/17/19 0800  HGB 12.3*   < > 11.3*  --  11.3*  HCT 37.9*  --  34.3*  --  34.1*  PLT 27*  --  76*  --  99*  APTT 52*  --  57* 59*  --   CREATININE 1.09  --  1.19 1.11  --    < > = values in this interval not displayed.    Estimated Creatinine Clearance: 60.2 mL/min (by C-G formula based on SCr of 1.11 mg/dL).  Assessment: 59 year old M w/ new aFlutter (CHADS2VASc = 0-1 for possible HTN), started on apixaban 2/24, changed to heparin then changed to bival for r/o HIT.  Patient was going to leave AMA this morning so started on apixaban (received on 3/8@0903 ). Hgb 11.3, plt 99 (increasing). Now patient agrees to stay in hospital - plan for cath on 3/9. Will restart bivalirudin at previously therapeutic rate on 3/8@2100 . SRA is still pending.   Goal of Therapy:  APTT 50-80 sec Monitor platelets by anticoagulation protocol: Yes   Plan:  Stop apixaban Restart bivalirudin at 0.11 mg/kg/hr on 3/8@2100  F/U HIT SRA  Antonietta Jewel, PharmD, [place] Pharmacist  Phone: [phone]  Please check AMION for all [place] phone numbers After 10:00 PM, call [place] [phone]  05/17/2019 12:07 PM

## 2019-05-17 NOTE — Progress Notes (Signed)
[facility] for Heparin >Bivalirudin > apixaban Indication: atrial fibrillation  Allergies  Allergen Reactions  . Heparin     R/o HIT 3/3    Patient Measurements: Height: 5\' 9"  (175.3 cm) Weight: 130 lb 15.3 oz (59.4 kg) IBW/kg (Calculated) : 70.7   Vital Signs: Temp: 98 F (36.7 C) (03/08 0742) Temp Source: Oral (03/08 0742) BP: 167/114 (03/08 0652) Pulse Rate: 106 (03/08 0742)  Labs: Recent Labs    05/15/19 0444 05/15/19 0444 05/16/19 0456 05/17/19 0500 05/17/19 0800  HGB 12.3*   < > 11.3*  --  11.3*  HCT 37.9*  --  34.3*  --  34.1*  PLT 27*  --  76*  --  99*  APTT 52*  --  57* 59*  --   CREATININE 1.09  --  1.19 1.11  --    < > = values in this interval not displayed.    Estimated Creatinine Clearance: 60.2 mL/min (by C-G formula based on SCr of 1.11 mg/dL).  Assessment: 59 year old M w/ new aFlutter (CHADS2VASc = 0-1 for possible HTN), started on apixaban 2/24 and 2/25 transitioned back to heparin for cath. Apixaban last dose 2/25 9:30. Will follow APTT until correlates with HL. 2/25 TEE/DCCV converted patient to NSR.   He is now on bivalirudin for possible HIT (heparin antibody negative; SRA pending) -aPTT at goal -platelets= 21>>76 Patient is adamant that he wants to go home today - will stop bivalirudan and begin apixaban and prepare meds list for DC.    Goal of Therapy:  APTT 50-80 sec Monitor platelets by anticoagulation protocol: Yes   Plan:  Stop bivalirudin apixaban 5mg  BID F/U HIT SRA  Bonnita Nasuti Pharm.D. CPP, BCPS Clinical Pharmacist [phone] 05/17/2019 8:38 AM    **Pharmacist phone directory can now be found on [email or web address] ([email or web address]).  Listed under [place].

## 2019-05-17 NOTE — Progress Notes (Signed)
Was notified by primary team that pt has decided to stay inpatient for further management and w/u for systolic HF/ afib.   Will resume cardiac meds. Hold Eliquis. Restart Bivalirudin. No plans to replace PICC at this time.   He is on for [facility] 3/9 at 1200 w/ Dr. Aundra Dubin  TEE/DCCV tentatively scheduled for 3/11.   Pt updated on plan.   Orders placed for [facility]. NPO at midnight.

## 2019-05-17 NOTE — Progress Notes (Signed)
PROGRESS NOTE    Austin Cobb  [id number] DOB: 03-Nov-1960 DOA: 05/04/2019 PCP: Patient, No Pcp Per     Brief Narrative:  59 year old BM PMHx Bipolar disorder, Anxiety and Depression tobacco, EtOH and polysubstance abuse,    Presenting with progressive dyspnea for 1 day and exertional dyspnea and fatigue for over 2 months.  Also had paroxysmal nocturnal dyspnea.   In ED, found to be in A.  Flutter with RVR with HR in the 130s.  Also hypertensive with SBP in 170s and DBP in 130s.  UDS negative.  Mildly elevated AST and ALT.BNP 2000.  High-sensitivity troponin 48> 50.  EKG revealed a flutter and LVH.  Lactic acid 2.1> 1.7.  Hgb 11.7.  UA with large LE and many bacteria.  POC COVID-19 test negative.  Cardiology consulted.  Patient admitted for atrial flutter with RVR and unknown type acute CHF.   D-dimer elevated.  CTA chest negative for PE.  Echo with EF of 10 to 15%, global hypokinesis, unknown diastolic function, severely reduced RVEF and severely enlarged RV with RVSP of 28.2 mmHg.   Patient underwent TEE guided cardioversion on 05/06/2019 and converted to NSR. TEE with EF of 10%, moderate MR, no thrombus or significant ASD or PFO.   Advanced heart failure team involved.  Started high-dose IV Lasix but renal function gotten worse.   Subjective: 3/8 A/O x4, negative CP, negative S OB, extremely weak, patient stating leaving AMA.  Explained to patient that if he left AMA he would most likely DIE.  Also explained to patient that the cardiologist DID NOT tell him that his heart was fine in fact had stated to him that he should remain in the hospital secondary to his poor cardiac function.    Assessment & Plan:   Active Problems:   Atrial flutter with rapid ventricular response (HCC)   Acute heart failure (HCC)   Essential hypertension   Acute on chronic combined systolic and diastolic CHF (congestive heart failure) (HCC)   Alcohol withdrawal (HCC)   Acute metabolic  encephalopathy   AKI (acute kidney injury) ([place])   Elevated d-dimer   Demand ischemia (HCC)   Elevated troponin   Pleural effusion   Polysubstance abuse (HCC)   E. coli UTI   Agitation   Noncompliance   Yeast UTI  Acute systolic CHF -Strict in and out +4.6 L -Daily weight Filed Weights   05/15/19 0429 05/16/19 0434 05/17/19 0652  Weight: 58.1 kg 58.4 kg 59.4 kg  -Cardiology consulted -Amiodarone drip per cardiology -Digoxin 0.125 mg daily -Hydralazine PRN -Aldactone 12.5 mg daily -Milrinone drip per cardiology (held) -Metoprolol 2.5 mg PRN -3/6 Lasix IV 20 mg post transfusion of platelets -3/7 Lasix IV 20 mg posttransfusion platelets -3/7 discussed case with Dr. Carlyle Dolly cardiology platelets will need to be> 100 in order to perform cardiac catheterization/DCCV  A flutter with RVR - CHA2DS2-VASc score of 2. -Converted to NSR but returned to A. fib on 2/27 -See CHF -Repeat DCCV once acute encephalopathy resolved and platelets recover -Bivalirudin secondary to being HIT positive  EtOH withdrawal/Acute Metabolic Encephalopathy -On admission did not obtain alcohol level -Urine tox screen negative -CIWA protocol.  NOTE Ativan substituted for Librium secondary to increasing thrombocytopenia -3/4 appears to be improving, mittens now off -3/5 Recent patient became agitated attempted to leave AMA, pulling out IV lines. -3/5 Restarted CIWA protocol IV, Haldol scheduled  Acute kidney injury (baseline Cr 1.1) Recent Labs  Lab 05/13/19 0452 05/14/19 0442 05/15/19 0444 05/16/19 0456  05/17/19 0500  CREATININE 1.05 1.28* 1.09 1.19 1.11  -Baseline -Hold diuretics -Hold nephrotoxic medication -Monitor closely  Elevated D-dimer Results for Austin Cobb, Austin Cobb (MRN [id number]) as of 05/14/2019 07:18  Ref. Range 05/12/2016 11:04 05/04/2019 21:44  D-Dimer, America Brown Latest Ref Range: 0.00 - 0.50 ug/mL-FEU 1.33 (H) 1.54 (H)  -Trend -CTA negative for PE, moderate bilateral pleural  effusion see below  Elevated troponin/Demand ischemia Results for Austin Cobb, Austin Cobb (MRN [id number]) as of 05/14/2019 07:18  Ref. Range 05/04/2019 15:40 05/04/2019 17:19  Troponin I (High Sensitivity) Latest Ref Range: <18 ng/L 48 (H) 50 (H)  -Most likely demand ischemia from A-flutter and CHF  Essential HTN -See CHF  Elevated liver enzymes  -Most likely secondary to EtOH abuse, CHF, and Amiodarone -Elevated liver enzymes: Likely due to alcohol, CHF and possibly due to amiodarone.   -Acute hepatitis panel and HIV negative. -3/6 liver enzymes improving   Bilateral pleural effusion -3/5 PCXR; improving aeration see results below  UTI positive E. Coli/ UTI positive yeast -Appears to have completed course of antibiotics -3/5 repeat urine culture positive yeast -3/6 Fluconazole for 2 weeks  Polysubstance abuse -2/23 urine tox screen negative -Cessation counseling when able -Consult CSW for resources -Continue CIWA protocol  Agitation -3/8 increase Haldol 1 mg TID; may need to titrate up  Tobacco abuse -Smokes 1 PPD -Cessation encouraged. -Nicotine patch   Generalized weakness/debility -PT/OT eval  Euthyroid sick syndrome:  -TSH 5.4.  Free T4 1.68 -Repeat thyroid panel in 4 to 6 weeks outpatient.  Hypomagnesmia -Magnesium goal> 2  Hypokalemia -Potassium goal>4  Thrombopenia/HIT syndrome Results for Austin Cobb, Austin Cobb (MRN [id number]) as of 05/16/2019 09:18  Ref. Range 05/13/2019 04:52 05/13/2019 11:42 05/14/2019 04:42 05/15/2019 04:44 05/16/2019 04:56  Platelets Latest Ref Range: 150 - 400 K/uL 23 (LL) 21 (LL) 21 (LL) 27 (LL) 76 (L)  -Avoid all heparin products -Positive HIT antibody positive -3/5 type and cross, if platelets fall any further or any signs of bleeding will transfuse immediately -3/6 appears platelets may be starting to trend up, however will take significant amount of time to recover to point where cardiology can perform multiple invasive procedures.. -3/6  transfuse 1 unit platelets -3/7 transfuse 1 unit platelets -Although at some increased risk of consuming platelets, and causing clots after waiting almost a week deemed appropriate in order to expedite patient's cardiac work-up given his EXTREMELY poor cardiac function  Noncompliance -Patient requested to leave AMA.  Counseled patient that if he attempted to leave given his poor cardiac condition he would DIE before reaching home, at that point patient changed his mind and requested that we help him. -3/8 consult to Psychiatry patient again initially attempted to leave AMA after cardiology explained the patient he should remain in the hospital for procedures and that it was unsafe for him to go home.  I then spoke with patient and explained that he would most likely DIE if he left the hospital without completing treatment, and he decided to stay.  Is patient competent to make decisions concerning his medical care?       DVT prophylaxis: Bivalirudin  Code Status: Full Family Communication: 3/6 spoke with Mr Evalina Field who is listed as patient's POC and per patient makes his healthcare decisions.  Explained need for transfusion of platelets in order to allow cardiologist to perform procedures to determine definitive treatment for patient Mr Baucino agreed to procedure. Disposition Plan: TBD per cardiology   Consultants:  Cardiology   Procedures/Significant Events:  2/24 CTA chest  PE protocol; similar: Negative for PE-moderate bilateral pleural effusions RIGHT>>> LEFT 2/25 Echocardiogram;LVEF; <20%. The left-ventricle has severely decreased function.  -The left ventricle demonstrates global hypokinesis.  -Left ventricular internal cavity size was mildly to moderately dilated.  -Mitral valve Mild to moderate regurgitation.  -3/5 CXR;-cardiomegaly with improved aeration of the lungs and resolved pleural effusions -3/6 transfuse 1 unit platelets -3/7 transfuse 1 unit platelets  I have  personally reviewed and interpreted all radiology studies and my findings are as above.  VENTILATOR SETTINGS:    Cultures 2/23 HIV negative 2/23 urine positive E. coli 2/24 SARS coronavirus 2 negative 2/24 acute hepatitis panel negative 3/5 urine pending     Antimicrobials: Anti-infectives (From admission, onward)   Start     Dose/Rate Stop   05/08/19 1400  amoxicillin (AMOXIL) capsule 500 mg     500 mg 05/09/19 2159   05/05/19 1900  cefTRIAXone (ROCEPHIN) 1 g in sodium chloride 0.9 % 100 mL IVPB  Status:  Discontinued     1 g 200 mL/hr over 30 Minutes 05/08/19 0751   05/04/19 1730  cefTRIAXone (ROCEPHIN) 1 g in sodium chloride 0.9 % 100 mL IVPB     1 g 200 mL/hr over 30 Minutes 05/04/19 2005       Devices    LINES / TUBES:      Continuous Infusions: . sodium chloride    . sodium chloride 10 mL/hr at 05/17/19 0701     Objective: Vitals:   05/16/19 1715 05/16/19 1950 05/17/19 0652 05/17/19 0742  BP: 117/83 (!) 136/97 (!) 167/114   Pulse: (!) 109 (!) 113 (!) 105 (!) 106  Resp: 18 16 19    Temp: 98.1 F (36.7 C) 98.2 F (36.8 C) 98.1 F (36.7 C) 98 F (36.7 C)  TempSrc: Oral Oral Oral Oral  SpO2: 97% 100% 100%   Weight:   59.4 kg   Height:        Intake/Output Summary (Last 24 hours) at 05/17/2019 1055 Last data filed at 05/17/2019 0701 Gross per 24 hour  Intake 2829.61 ml  Output 2370 ml  Net 459.61 ml   Filed Weights   05/15/19 0429 05/16/19 0434 05/17/19 0652  Weight: 58.1 kg 58.4 kg 59.4 kg   Physical Exam:  General: A/O x4, no acute respiratory distress, cachectic, extremely weak Eyes: negative scleral hemorrhage, negative anisocoria, negative icterus ENT: Negative Runny nose, negative gingival bleeding, Neck:  Negative scars, masses, torticollis, lymphadenopathy, JVD Lungs: Clear to auscultation bilaterally without wheezes or crackles Cardiovascular: Regular rate and rhythm without murmur gallop or rub normal S1 and S2 Abdomen: negative  abdominal pain, nondistended, positive soft, bowel sounds, no rebound, no ascites, no appreciable mass Extremities: No significant cyanosis, clubbing, or edema bilateral lower extremities Skin: Negative rashes, lesions, ulcers Psychiatric:  Negative depression, negative anxiety, negative fatigue, negative mania, EXTREMELY POOR understanding of his disease process and the fact that he most likely WILL die if he leaves AMA Central nervous system:  Cranial nerves II through XII intact, tongue/uvula midline, all extremities muscle strength 5/5, sensation intact throughout, negative dysarthria, negative expressive aphasia, negative receptive aphasia.      Data Reviewed: Care during the described time interval was provided by me .  I have reviewed this patient's available data, including medical history, events of note, physical examination, and all test results as part of my evaluation.  CBC: Recent Labs  Lab 05/13/19 1142 05/14/19 0442 05/15/19 0444 05/16/19 0456 05/17/19 0800  WBC 6.4 5.9 7.1 6.6 8.2  HGB 11.9* 11.8* 12.3* 11.3* 11.3*  HCT 35.3* 35.2* 37.9* 34.3* 34.1*  MCV 91.2 91.4 92.7 92.5 91.2  PLT 21* 21* 27* 76* 99*   Basic Metabolic Panel: Recent Labs  Lab 05/13/19 0452 05/14/19 0442 05/15/19 0444 05/16/19 0456 05/17/19 0500  NA 135 139 139 140 137  K 4.0 3.8 4.2 3.7 4.4  CL 104 103 107 103 100  CO2 24 26 24 27 28   GLUCOSE 89 136* 98 108* 84  BUN 9 15 12 10 12   CREATININE 1.05 1.28* 1.09 1.19 1.11  CALCIUM 8.2* 8.4* 8.7* 8.5* 8.8*  MG 2.0 1.9 1.7 1.7 1.9  PHOS 3.7 4.4 4.1 5.0* 4.7*   GFR: Estimated Creatinine Clearance: 60.2 mL/min (by C-G formula based on SCr of 1.11 mg/dL). Liver Function Tests: Recent Labs  Lab 05/13/19 0452 05/14/19 0442 05/15/19 0444 05/16/19 0456 05/17/19 0500  AST 111* 73* 59* 49* 43*  ALT 650* 479* 431* 318* 249*  ALKPHOS 76 75 77 74 71  BILITOT 2.6* 2.0* 1.5* 1.0 0.7  PROT 5.6* 5.6* 6.1* 6.1* 6.3*  ALBUMIN 2.7* 2.7* 3.0* 3.0*  3.1*   No results for input(s): LIPASE, AMYLASE in the last 168 hours. Recent Labs  Lab 05/10/19 2119 05/14/19 0802 05/15/19 0441 05/16/19 0456 05/17/19 0500  AMMONIA 29 24 21 19 18    Coagulation Profile: No results for input(s): INR, PROTIME in the last 168 hours. Cardiac Enzymes: No results for input(s): CKTOTAL, CKMB, CKMBINDEX, TROPONINI in the last 168 hours. BNP (last 3 results) No results for input(s): PROBNP in the last 8760 hours. HbA1C: No results for input(s): HGBA1C in the last 72 hours. CBG: Recent Labs  Lab 05/10/19 2149  GLUCAP 103*   Lipid Profile: No results for input(s): CHOL, HDL, LDLCALC, TRIG, CHOLHDL, LDLDIRECT in the last 72 hours. Thyroid Function Tests: No results for input(s): TSH, T4TOTAL, FREET4, T3FREE, THYROIDAB in the last 72 hours. Anemia Panel: No results for input(s): VITAMINB12, FOLATE, FERRITIN, TIBC, IRON, RETICCTPCT in the last 72 hours. Sepsis Labs: No results for input(s): PROCALCITON, LATICACIDVEN in the last 168 hours.  Recent Results (from the past 240 hour(s))  Culture, Urine     Status: Abnormal   Collection Time: 05/14/19  7:49 AM   Specimen: Urine, Random  Result Value Ref Range Status   Specimen Description URINE, RANDOM  Final   Special Requests   Final    NONE Performed at [facility], [address], [place], [place] [address]    Culture >=100,000 COLONIES/mL YEAST (A)  Final   Report Status 05/15/2019 FINAL  Final         Radiology Studies: No results found.      Scheduled Meds: . sodium chloride   Intravenous Once  . amiodarone  200 mg Oral BID  . apixaban  5 mg Oral BID  . Chlorhexidine Gluconate Cloth  6 each Topical Daily  . digoxin  0.125 mg Oral Daily  . feeding supplement (ENSURE ENLIVE)  237 mL Oral 4x daily  . fluconazole  200 mg Oral Daily  . folic acid  1 mg Intravenous Daily  . haloperidol lactate  1 mg Intravenous BID  . lactulose  20 g Oral BID   Or  . lactulose  300 mL  Rectal BID  . LORazepam  0-4 mg Intravenous Q8H  . multivitamin with minerals  1 tablet Oral Daily  . nicotine  21 mg Transdermal Daily  . sacubitril-valsartan  1 tablet Oral BID  . sodium  chloride flush  10-40 mL Intracatheter Q12H  . sodium chloride flush  3 mL Intravenous Q12H  . sodium chloride flush  3 mL Intravenous Q12H  . spironolactone  25 mg Oral Daily  . thiamine  100 mg Oral Daily   Or  . thiamine  100 mg Intravenous Daily   Continuous Infusions: . sodium chloride    . sodium chloride 10 mL/hr at 05/17/19 0701     LOS: 13 days    Time spent:40 min    , Geraldo Docker, MD [facility] Pager [phone]  If 7PM-7AM, please contact night-coverage [email or web address] Password [facility] 05/17/2019, 10:55 AM

## 2019-05-17 NOTE — Progress Notes (Signed)
Patient's HR noted to be 110-120 bpm at this time, laying in bed, aflutter.  BP 141/89.  On call HF team notified via [place].

## 2019-05-17 NOTE — Progress Notes (Signed)
Took patient instructions for TEE/DCCV sch for 3/11, reviewed instructions with him, he verbalized understanding, copy left with patient.

## 2019-05-17 NOTE — Plan of Care (Signed)
  Problem: Nutrition: Goal: Adequate nutrition will be maintained Outcome: Progressing   Problem: Coping: Goal: Level of anxiety will decrease Outcome: Not Progressing   Problem: Elimination: Goal: Will not experience complications related to bowel motility Outcome: Completed/Met

## 2019-05-17 NOTE — Consult Note (Addendum)
Telepsych Consultation   Reason for Consult:  "Capacity for AMA" Referring Physician:  Internal Medicine  Location of Patient: [id number]-01 Location of Provider: [facility]  Patient Identification: GLADE STRAUSSER MRN:  [id number] Principal Diagnosis: <principal problem not specified> Diagnosis:  Active Problems:   Atrial flutter with rapid ventricular response ([place])   Acute heart failure ([place])   Essential hypertension   Acute on chronic combined systolic and diastolic CHF (congestive heart failure) ([place])   Alcohol withdrawal ([place])   Acute metabolic encephalopathy   AKI (acute kidney injury) ([place])   Elevated d-dimer   Demand ischemia ([facility])   Elevated troponin   Pleural effusion   Polysubstance abuse ([place])   E. coli UTI   Agitation   Noncompliance   Yeast UTI   Total Time spent with patient: 15 minutes  Subjective:   Austin Cobb is a 59 y.o. male was evaluated via teleassessment.  Patient was irritable and agitated throughout assessment. "  I need to get my pants on" patient does not appear to be redirectabel by staff.  NP attempted to test patient's orientation, attention span and immediate recall.  Patient was unable to answer questions appropriate.  Riddick reported " I was admitted for exhaustion, I think the ambulance switched... (Unintelligible verbiage.)  Patient is unable to recall medications that he is currently prescribed and/or taking. Patient was then observed trying to get dress. Patient put his hat on and attempts to put on his pants.  RN reported " you need to leave your pants off because your bleeding from your  penis. "  Patient reported " I am leaving and I don't care."  Patient does not appear to have capacity to make decisions at this time.  Case staffed with attending psychiatrist Dwyane Dee.  Support, encouragement reassurance was provided.  HPI: Per admission assessment.-59 year old male with history of tobacco, EtOH and polysubstance abuse,  bipolar disorder, anxiety and depression presenting with progressive dyspnea for 1 day and exertional dyspnea and fatigue for over 2 months.  Also had paroxysmal nocturnal dyspnea. Consult request: patient again initially attempted to leave AMA after cardiology explained the patient he should remain in the hospital for procedures and that it was unsafe for him to go home. I then spoke with patient and explained that he would most likely DIE if he left the hospital without completing treatment, and he decided to stay. Is patient competent to make decisions concerning his medical care?  Past Psychiatric History:   Risk to Self: Is patient at risk for suicide?: No Risk to Others:   Prior Inpatient Therapy:   Prior Outpatient Therapy:    Past Medical History:  Past Medical History:  Diagnosis Date  . Anxiety   . Arthritis   . Bipolar 1 disorder ([place])   . Depression    Bipolar, not on meds/pt. stopped meds on own over 12 yrs ago  . GERD (gastroesophageal reflux disease)   . History of colon polyps   . Manic disorder ([place])   . Noncompliance 05/14/2019  . Substance abuse ([place])    12yr ago smoke crack and marjuana  . Tobacco abuse 05/13/2012    Past Surgical History:  Procedure Laterality Date  . CARDIOVERSION N/A 05/06/2019   Procedure: CARDIOVERSION;  Surgeon: NAcie FredricksonPWonda Cheng MD;  [facility]: [facility]  Service: Cardiovascular;  Laterality: N/A;  . EXAMINATION UNDER ANESTHESIA N/A 11/12/2012   Procedure: EJasmine DecemberUNDER ANESTHESIA;  Surgeon: SAdin Hector MD;  Location: [facility];  Service: General;  Laterality: N/A;  . EYE SURGERY     bilateral eye sx as a child [age]yr old.  .Joan MayansN/A 11/12/2012   Procedure: SJoan Mayans  Surgeon: SAdin Hector MD;  Location: [facility];  Service: General;  Laterality: N/A;  . TEE WITHOUT CARDIOVERSION N/A 05/06/2019   Procedure: TRANSESOPHAGEAL ECHOCARDIOGRAM (TEE);  Surgeon: NAcie FredricksonPWonda Cheng MD;  [facility]  Service: Cardiovascular;   Laterality: N/A;  . TONSILLECTOMY     Family History:  Family History  Problem Relation Age of Onset  . Colon polyps Father   . Diabetes Father   . Diabetes Brother    Family Psychiatric  History:  Social History:  Social History   Substance and Sexual Activity  Alcohol Use Yes  . Alcohol/week: 4.0 standard drinks  . Types: 4 Cans of beer per week     Social History   Substance and Sexual Activity  Drug Use Yes  . Types: Cocaine, Marijuana   Comment: Quit x6 yrs ago-     Social History   Socioeconomic History  . Marital status: Divorced    Spouse name: Not on file  . Number of children: Not on file  . Years of education: Not on file  . Highest education level: Not on file  Occupational History  . Not on file  Tobacco Use  . Smoking status: Current Every Day Smoker    Packs/day: 1.00    Years: 35.00    Pack years: 35.00    Types: Cigarettes  . Smokeless tobacco: Never Used  Substance and Sexual Activity  . Alcohol use: Yes    Alcohol/week: 4.0 standard drinks    Types: 4 Cans of beer per week  . Drug use: Yes    Types: Cocaine, Marijuana    Comment: Quit x6 yrs ago-   . Sexual activity: Never  Other Topics Concern  . Not on file  Social History Narrative  . Not on file   Social Determinants of Health   Financial Resource Strain:   . Difficulty of Paying Living Expenses: Not on file  Food Insecurity:   . Worried About RCharity fundraiserin the Last Year: Not on file  . Ran Out of Food in the Last Year: Not on file  Transportation Needs:   . Lack of Transportation (Medical): Not on file  . Lack of Transportation (Non-Medical): Not on file  Physical Activity:   . Days of Exercise per Week: Not on file  . Minutes of Exercise per Session: Not on file  Stress:   . Feeling of Stress : Not on file  Social Connections:   . Frequency of Communication with Friends and Family: Not on file  . Frequency of Social Gatherings with Friends and Family: Not on  file  . Attends Religious Services: Not on file  . Active Member of Clubs or Organizations: Not on file  . Attends CArchivistMeetings: Not on file  . Marital Status: Not on file   Additional Social History:    Allergies:   Allergies  Allergen Reactions  . Heparin     R/o HIT 3/3    Labs:  Results for orders placed or performed during the hospital encounter of 05/04/19 (from the past 48 hour(s))  Prepare Pheresed Platelets     Status: None   Collection Time: 05/15/19  1:14 PM  Result Value Ref Range   Unit Number [id number]   Blood Component Type PLTP LR1 PAS  Unit division 00    Status of Unit ISSUED,FINAL    Transfusion Status      OK TO TRANSFUSE Performed at [facility], [place] [address] [place], [place] [address]   .Cooxemetry Panel (carboxy, met, total hgb, O2 sat)     Status: Abnormal   Collection Time: 05/16/19  4:35 AM  Result Value Ref Range   Total hemoglobin 11.1 (L) 12.0 - 16.0 g/dL   O2 Saturation 56.4 %   Carboxyhemoglobin 1.1 0.5 - 1.5 %   Methemoglobin 0.6 0.0 - 1.5 %    Comment: Performed at [facility], [place] [address] [place], [place] [address]  CBC     Status: Abnormal   Collection Time: 05/16/19  4:56 AM  Result Value Ref Range   WBC 6.6 4.0 - 10.5 K/uL   RBC 3.71 (L) 4.22 - 5.81 MIL/uL   Hemoglobin 11.3 (L) 13.0 - 17.0 g/dL   HCT 34.3 (L) 39.0 - 52.0 %   MCV 92.5 80.0 - 100.0 fL   MCH 30.5 26.0 - 34.0 pg   MCHC 32.9 30.0 - 36.0 g/dL   RDW 15.4 11.5 - 15.5 %   Platelets 76 (L) 150 - 400 K/uL    Comment: REPEATED TO VERIFY Immature Platelet Fraction may be clinically indicated, consider ordering this additional test [id number] CONSISTENT WITH PREVIOUS RESULT    nRBC 0.0 0.0 - 0.2 %    Comment: Performed at [facility], [place] [address] [place], [place] [address]  APTT     Status: Abnormal   Collection Time: 05/16/19  4:56 AM  Result Value Ref Range   aPTT 57 (H) 24 - 36 seconds    Comment:        IF  BASELINE aPTT IS ELEVATED, SUGGEST PATIENT RISK ASSESSMENT BE USED TO DETERMINE APPROPRIATE ANTICOAGULANT THERAPY. Performed at [facility], [place] [address] [place], [place] [address]   Comprehensive metabolic panel     Status: Abnormal   Collection Time: 05/16/19  4:56 AM  Result Value Ref Range   Sodium 140 135 - 145 mmol/L   Potassium 3.7 3.5 - 5.1 mmol/L   Chloride 103 98 - 111 mmol/L   CO2 27 22 - 32 mmol/L   Glucose, Bld 108 (H) 70 - 99 mg/dL    Comment: Glucose reference range applies only to samples taken after fasting for at least 8 hours.   BUN 10 6 - 20 mg/dL   Creatinine, Ser 1.19 0.61 - 1.24 mg/dL   Calcium 8.5 (L) 8.9 - 10.3 mg/dL   Total Protein 6.1 (L) 6.5 - 8.1 g/dL   Albumin 3.0 (L) 3.5 - 5.0 g/dL   AST 49 (H) 15 - 41 U/L   ALT 318 (H) 0 - 44 U/L   Alkaline Phosphatase 74 38 - 126 U/L   Total Bilirubin 1.0 0.3 - 1.2 mg/dL   GFR calc non Af Amer >60 >60 mL/min   GFR calc Af Amer >60 >60 mL/min   Anion gap 10 5 - 15    Comment: Performed at [facility], [place] [address], [place], [place] [address]  Magnesium     Status: None   Collection Time: 05/16/19  4:56 AM  Result Value Ref Range   Magnesium 1.7 1.7 - 2.4 mg/dL    Comment: Performed at [facility] [address] [place], [place] [address]  Phosphorus     Status: Abnormal   Collection Time:  05/16/19  4:56 AM  Result Value Ref Range   Phosphorus 5.0 (H) 2.5 - 4.6 mg/dL    Comment: Performed at [facility] [address], [place], [place] [address]  Ammonia     Status: None   Collection Time: 05/16/19  4:56 AM  Result Value Ref Range   Ammonia 19 9 - 35 umol/L    Comment: Performed at [facility], [place] [address], [place], [place] [address]  D-dimer, quantitative (not at [facility])     Status: Abnormal   Collection Time: 05/16/19  4:56 AM  Result Value Ref Range   D-Dimer, Quant 1.16 (H) 0.00 - 0.50 ug/mL-FEU    Comment: (NOTE) At the manufacturer cut-off of 0.50 ug/mL  FEU, this assay has been documented to exclude PE with a sensitivity and negative predictive value of 97 to 99%.  At this time, this assay has not been approved by the FDA to exclude DVT/VTE. Results should be correlated with clinical presentation. Performed at [facility], [place] [address], [place], [place] [address]   Prepare Pheresed Platelets     Status: None   Collection Time: 05/16/19  9:30 AM  Result Value Ref Range   Unit Number [id number]    Blood Component Type PLTP LR2 PAS    Unit division 00    Status of Unit ISSUED,FINAL    Transfusion Status      OK TO TRANSFUSE Performed at [facility] [address] , [place] [address]   APTT     Status: Abnormal   Collection Time: 05/17/19  5:00 AM  Result Value Ref Range   aPTT 59 (H) 24 - 36 seconds    Comment:        IF BASELINE aPTT IS ELEVATED, SUGGEST PATIENT RISK ASSESSMENT BE USED TO DETERMINE APPROPRIATE ANTICOAGULANT THERAPY. Performed at [facility], [place] [address] [place], [place] [address]   Comprehensive metabolic panel     Status: Abnormal   Collection Time: 05/17/19  5:00 AM  Result Value Ref Range   Sodium 137 135 - 145 mmol/L   Potassium 4.4 3.5 - 5.1 mmol/L   Chloride 100 98 - 111 mmol/L   CO2 28 22 - 32 mmol/L   Glucose, Bld 84 70 - 99 mg/dL    Comment: Glucose reference range applies only to samples taken after fasting for at least 8 hours.   BUN 12 6 - 20 mg/dL   Creatinine, Ser 1.11 0.61 - 1.24 mg/dL   Calcium 8.8 (L) 8.9 - 10.3 mg/dL   Total Protein 6.3 (L) 6.5 - 8.1 g/dL   Albumin 3.1 (L) 3.5 - 5.0 g/dL   AST 43 (H) 15 - 41 U/L   ALT 249 (H) 0 - 44 U/L   Alkaline Phosphatase 71 38 - 126 U/L   Total Bilirubin 0.7 0.3 - 1.2 mg/dL   GFR calc non Af Amer >60 >60 mL/min   GFR calc Af Amer >60 >60 mL/min   Anion gap 9 5 - 15    Comment: Performed at [place] [address] [place], [place] [address]  Magnesium     Status: None   Collection Time:  05/17/19  5:00 AM  Result Value Ref Range   Magnesium 1.9 1.7 - 2.4 mg/dL    Comment: Performed at [facility], [place] [address], [place],  [address]  Phosphorus     Status: Abnormal   Collection  Time: 05/17/19  5:00 AM  Result Value Ref Range   Phosphorus 4.7 (H) 2.5 - 4.6 mg/dL    Comment: Performed at [facility] [address], [place], [place] [address]  Ammonia     Status: None   Collection Time: 05/17/19  5:00 AM  Result Value Ref Range   Ammonia 18 9 - 35 umol/L    Comment: Performed at [facility], [place] [address] [place], [place] [address]  D-dimer, quantitative (not at [facility])     Status: Abnormal   Collection Time: 05/17/19  5:00 AM  Result Value Ref Range   D-Dimer, Quant 0.82 (H) 0.00 - 0.50 ug/mL-FEU    Comment: (NOTE) At the manufacturer cut-off of 0.50 ug/mL FEU, this assay has been documented to exclude PE with a sensitivity and negative predictive value of 97 to 99%.  At this time, this assay has not been approved by the FDA to exclude DVT/VTE. Results should be correlated with clinical presentation. Performed at [facility], [place] [address], [place], [place] [address]   Cooxemetry Panel (carboxy, met, total hgb, O2 sat)     Status: Abnormal   Collection Time: 05/17/19  6:22 AM  Result Value Ref Range   Total hemoglobin 11.5 (L) 12.0 - 16.0 g/dL   O2 Saturation 58.9 %   Carboxyhemoglobin 1.3 0.5 - 1.5 %   Methemoglobin 1.0 0.0 - 1.5 %    Comment: Performed at [facility], [place] [address] [place], [place] [address]  CBC     Status: Abnormal   Collection Time: 05/17/19  8:00 AM  Result Value Ref Range   WBC 8.2 4.0 - 10.5 K/uL   RBC 3.74 (L) 4.22 - 5.81 MIL/uL   Hemoglobin 11.3 (L) 13.0 - 17.0 g/dL   HCT 34.1 (L) 39.0 - 52.0 %   MCV 91.2 80.0 - 100.0 fL   MCH 30.2 26.0 - 34.0 pg   MCHC 33.1 30.0 - 36.0 g/dL   RDW 14.8 11.5 - 15.5 %   Platelets 99 (L) 150 - 400 K/uL    Comment: REPEATED TO VERIFY Immature Platelet  Fraction may be clinically indicated, consider ordering this additional test [id number] CONSISTENT WITH PREVIOUS RESULT    nRBC 0.0 0.0 - 0.2 %    Comment: Performed at [facility], [place] [address], [place], [place] [address]    Medications:  Current Facility-Administered Medications  Medication Dose Route Frequency Provider Last Rate Last Admin  . 0.9 %  sodium chloride infusion (Manually program via Guardrails IV Fluids)   Intravenous Once Allie Bossier, MD      . 0.9 %  sodium chloride infusion  250 mL Intravenous PRN Lyda Jester M, PA-C      . 0.9 %  sodium chloride infusion   Intravenous Continuous Consuelo Pandy, PA-C 10 mL/hr at 05/17/19 0701 Rate Verify at 05/17/19 0701  . amiodarone (PACERONE) tablet 200 mg  200 mg Oral BID Allie Bossier, MD   200 mg at 05/17/19 0930  . bivalirudin (ANGIOMAX) 250 mg in sodium chloride 0.9 % 500 mL (0.5 mg/mL) infusion  0.11 mg/kg/hr (Order-Specific) Intravenous Continuous Larey Dresser, MD      . Chlorhexidine Gluconate Cloth 2 % PADS 6 each  6 each Topical Daily Mercy Riding, MD   6 each at 05/17/19 0931  . digoxin (LANOXIN) tablet 0.125 mg  0.125 mg Oral Daily Larey Dresser, MD   0.125  mg at 05/17/19 0903  . feeding supplement (ENSURE ENLIVE) (ENSURE ENLIVE) liquid 237 mL  237 mL Oral 4x daily Allie Bossier, MD   237 mL at 05/17/19 0931  . fluconazole (DIFLUCAN) tablet 200 mg  200 mg Oral Daily Allie Bossier, MD   200 mg at 05/17/19 2122  . folic acid injection 1 mg  1 mg Intravenous Daily Allie Bossier, MD   1 mg at 05/17/19 1000  . haloperidol lactate (HALDOL) injection 1 mg  1 mg Intravenous BID Allie Bossier, MD   1 mg at 05/17/19 0906  . hydrALAZINE (APRESOLINE) injection 10 mg  10 mg Intravenous Q6H PRN Allie Bossier, MD      . lactulose (CHRONULAC) 10 GM/15ML solution 20 g  20 g Oral BID Wendee Beavers T, MD   20 g at 05/17/19 0904   Or  . lactulose (CHRONULAC) enema 200 gm  300 mL Rectal BID Wendee Beavers  T, MD      . LORazepam (ATIVAN) injection 0-4 mg  0-4 mg Intravenous Q8H Allie Bossier, MD      . LORazepam (ATIVAN) tablet 1-4 mg  1-4 mg Oral Q1H PRN Allie Bossier, MD       Or  . LORazepam (ATIVAN) injection 1-4 mg  1-4 mg Intravenous Q1H PRN Allie Bossier, MD   2 mg at 05/16/19 2206  . metoprolol tartrate (LOPRESSOR) injection 2.5 mg  2.5 mg Intravenous Q2H PRN Allie Bossier, MD      . multivitamin with minerals tablet 1 tablet  1 tablet Oral Daily Allie Bossier, MD   1 tablet at 05/17/19 [phone]  . nicotine (NICODERM CQ - dosed in mg/24 hours) patch 21 mg  21 mg Transdermal Daily Roby Lofts M., PA-C   21 mg at 05/17/19 0906  . sacubitril-valsartan (ENTRESTO) 24-26 mg per tablet  1 tablet Oral BID Larey Dresser, MD   1 tablet at 05/17/19 [phone]  . sodium chloride flush (NS) 0.9 % injection 10-40 mL  10-40 mL Intracatheter Q12H Wendee Beavers T, MD   10 mL at 05/17/19 0931  . sodium chloride flush (NS) 0.9 % injection 10-40 mL  10-40 mL Intracatheter PRN Gonfa, Taye T, MD      . sodium chloride flush (NS) 0.9 % injection 3 mL  3 mL Intravenous Q12H Lyda Jester M, PA-C   3 mL at 05/15/19 2053  . sodium chloride flush (NS) 0.9 % injection 3 mL  3 mL Intravenous Q12H Simmons, Brittainy M, PA-C   3 mL at 05/17/19 0930  . sodium chloride flush (NS) 0.9 % injection 3 mL  3 mL Intravenous PRN Lyda Jester M, PA-C      . spironolactone (ALDACTONE) tablet 25 mg  25 mg Oral Daily Larey Dresser, MD   25 mg at 05/17/19 0488  . thiamine tablet 100 mg  100 mg Oral Daily Nahser, Wonda Cheng, MD   100 mg at 05/17/19 8916   Or  . thiamine (B-1) injection 100 mg  100 mg Intravenous Daily Nahser, Wonda Cheng, MD   100 mg at 05/16/19 1001    Musculoskeletal: Strength & Muscle Tone: within normal limits Gait & Station: unsteady Patient leans: N/A  Psychiatric Specialty Exam: Physical Exam  Neurological: He is alert.  Psychiatric: He has a normal mood and affect.    Review of Systems   Psychiatric/Behavioral: Positive for agitation. The patient is nervous/anxious.   All other systems reviewed and  are negative.   Blood pressure (!) 167/114, pulse (!) 106, temperature 98 F (36.7 C), temperature source Oral, resp. rate 19, height 5' 9"  (1.753 m), weight 59.4 kg, SpO2 100 %.Body mass index is 19.34 kg/m.  General Appearance: Casual blue hospital grown  Eye Contact:  Fair  Speech:  Clear and Coherent slightly pressured  Volume:  Fluctuations  Mood:  Anxious and Irritable  Affect:  Labile  Thought Process:  Linear  Orientation:  Other:  person and place  Thought Content:  Hallucinations: None  Suicidal Thoughts:  No  Homicidal Thoughts:  No  Memory:  Immediate;   Fair Recent;   Fair  Judgement:  Fair  Insight:  Fair  Psychomotor Activity:  NA unsteady gaite  Concentration:  Concentration: Poor  Recall:  Poor  Fund of Knowledge:  Poor  Language:  Fair  Akathisia:  No  Handed:  Right  AIMS (if indicated):     Assets:  Communication Skills Desire for Improvement Social Support  ADL's:  Intact  Cognition:  WNL  Sleep:       Disposition: Patient does not meet criteria for psychiatric inpatient admission. Supportive therapy provided about ongoing stressors.   -Case staffed with attending psychiatrist Dwyane Dee.  Patient does not appear to have medical capacity at this time.  This service was provided via telemedicine using a 2-way, interactive audio and video technology.  Names of all persons participating in this telemedicine service and their role in this encounter. Name: Mallie Linnemann Role: patient   Name: T.Feliz Lincoln  Role: NP           Derrill [place], NP 05/17/2019 12:37 PM

## 2019-05-17 NOTE — TOC Benefit Eligibility Note (Signed)
Transition of Care [facility]) Benefit Eligibility Note    Patient Details  Name: Austin Cobb MRN: [id number] Date of Birth: 07-06-1960   Medication/Dose: [name] 24-26mg  bid  Covered?: Yes  Tier: 3 Drug  Prescription Coverage Preferred Pharmacy: [facility] with Person/Company/Phone Number:: Kerri Perches [facility] [phone]  Co-Pay: 312.00 for a 30 Day Supply  Prior Approval: No  Deductible: Unmet(265.00)  Additional Notes: Check on Xarelto and it is the same amount for a 30 day supply    Orbie Pyo Phone Number: 05/17/2019, 9:58 AM

## 2019-05-17 NOTE — Discharge Instructions (Signed)

## 2019-05-17 NOTE — Progress Notes (Addendum)
Patient ID: Austin Cobb, male   DOB: 12-Sep-1960, 59 y.o.   MRN: [id number]     Advanced Heart Failure Rounding Note  PCP-Cardiologist: Donato Heinz, MD   Subjective:    Frustrated. Says he wants to go home today. Denies SOB.   Objective:   Weight Range: 59.4 kg Body mass index is 19.34 kg/m.   Vital Signs:   Temp:  [98 F (36.7 C)-98.2 F (36.8 C)] 98.1 F (36.7 C) (03/08 0652) Pulse Rate:  [103-113] 105 (03/08 0652) Resp:  [16-20] 19 (03/08 0652) BP: (117-179)/(79-114) 167/114 (03/08 0652) SpO2:  [97 %-100 %] 100 % (03/08 0652) Weight:  [59.4 kg] 59.4 kg (03/08 0652) Last BM Date: 05/15/19  Weight change: Filed Weights   05/15/19 0429 05/16/19 0434 05/17/19 0652  Weight: 58.1 kg 58.4 kg 59.4 kg    Intake/Output:   Intake/Output Summary (Last 24 hours) at 05/17/2019 0716 Last data filed at 05/17/2019 0136 Gross per 24 hour  Intake 2110.3 ml  Output 2770 ml  Net -659.7 ml      Physical Exam   CVP 6-7  General:  No resp difficulty HEENT: normal Neck: supple. no JVD. Carotids 2+ bilat; no bruits. No lymphadenopathy or thryomegaly appreciated. Cor: PMI nondisplaced. Regular tachy rate & rhythm. No rubs, gallops or murmurs. Lungs: clear Abdomen: soft, nontender, nondistended. No hepatosplenomegaly. No bruits or masses. Good bowel sounds. Extremities: no cyanosis, clubbing, rash, edema. RUE PICC  Neuro: alert & orientedx3, cranial nerves grossly intact. moves all 4 extremities w/o difficulty. Affect pleasant   Telemetry    Aflutter 100s.   Labs    CBC Recent Labs    05/15/19 0444 05/16/19 0456  WBC 7.1 6.6  HGB 12.3* 11.3*  HCT 37.9* 34.3*  MCV 92.7 92.5  PLT 27* 76*   Basic Metabolic Panel Recent Labs    05/15/19 0444 05/16/19 0456  NA 139 140  K 4.2 3.7  CL 107 103  CO2 24 27  GLUCOSE 98 108*  BUN 12 10  CREATININE 1.09 1.19  CALCIUM 8.7* 8.5*  MG 1.7 1.7  PHOS 4.1 5.0*   Liver Function Tests Recent Labs     05/15/19 0444 05/16/19 0456  AST 59* 49*  ALT 431* 318*  ALKPHOS 77 74  BILITOT 1.5* 1.0  PROT 6.1* 6.1*  ALBUMIN 3.0* 3.0*   No results for input(s): LIPASE, AMYLASE in the last 72 hours. Cardiac Enzymes No results for input(s): CKTOTAL, CKMB, CKMBINDEX, TROPONINI in the last 72 hours.  BNP: BNP (last 3 results) Recent Labs    05/04/19 1540  BNP 2,060.2*    ProBNP (last 3 results) No results for input(s): PROBNP in the last 8760 hours.   D-Dimer Recent Labs    05/15/19 0444 05/16/19 0456  DDIMER 0.92* 1.16*   Hemoglobin A1C No results for input(s): HGBA1C in the last 72 hours. Fasting Lipid Panel No results for input(s): CHOL, HDL, LDLCALC, TRIG, CHOLHDL, LDLDIRECT in the last 72 hours. Thyroid Function Tests No results for input(s): TSH, T4TOTAL, T3FREE, THYROIDAB in the last 72 hours.  Invalid input(s): FREET3  Other results:   Imaging    No results found.   Medications:     Scheduled Medications: . sodium chloride   Intravenous Once  . Chlorhexidine Gluconate Cloth  6 each Topical Daily  . digoxin  0.125 mg Oral Daily  . feeding supplement (ENSURE ENLIVE)  237 mL Oral 4x daily  . fluconazole  200 mg Oral Daily  . folic  acid  1 mg Intravenous Daily  . haloperidol lactate  1 mg Intravenous BID  . lactulose  20 g Oral BID   Or  . lactulose  300 mL Rectal BID  . LORazepam  0-4 mg Intravenous Q8H  . losartan  50 mg Oral Daily  . multivitamin with minerals  1 tablet Oral Daily  . nicotine  21 mg Transdermal Daily  . sodium chloride flush  10-40 mL Intracatheter Q12H  . sodium chloride flush  3 mL Intravenous Q12H  . sodium chloride flush  3 mL Intravenous Q12H  . spironolactone  12.5 mg Oral Daily  . thiamine  100 mg Oral Daily   Or  . thiamine  100 mg Intravenous Daily    Infusions: . sodium chloride    . sodium chloride 10 mL/hr at 05/13/19 1514  . amiodarone 30 mg/hr (05/16/19 1132)  . bivalirudin (ANGIOMAX) infusion 0.5 mg/mL  (Non-ACS indications) 0.11 mg/kg/hr (05/16/19 0600)    PRN Medications: sodium chloride, hydrALAZINE, LORazepam **OR** LORazepam, metoprolol tartrate, sodium chloride flush, sodium chloride flush   Assessment/Plan   1. Altered mental status/delirium: Suspect ETOH withdrawal with significant rise in BP and history of heavy ETOH.  Also possible hepatic encephalopathy with elevated NH3.  This seems to have resolved, NH3 normal at 18 today.  2.  AKI: Suspect cardiorenal, possibly with low output. Contrast nephropathy from PE CT may also play a role.  This has resolved.  3. Acute systolic CHF: Echo this admission with EF 15%, severe RV dysfunction.  No prior cardiac history.  Etiology is uncertain => could be tachycardia-mediated CMP but exertional dyspnea pre-dated palpitations, could be due to long-standing heavy ETOH use, could be viral cardiomyopathy, cannot rule out CAD. He had DCCV but recurrence of atrial flutter on 2/27  and remains in flutter today, rates in the 80s now.  Co-ox markedly low initially, milrinone started.  He is now off milrinone with stable co-ox, 58% today.  - No diuretic.  CVP 6-7 . Stable.  - Increase spiro to 25 mg daily.   - Continue losartan 12.5 daily.    - Continue digoxin 0.125 daily.  -Plan RHC/LHC tomorrow. .  4. Atrial flutter: AFL with RVR at admission, uncertain how long.  He had felt palpitations for about a week.  Possible component of tachy-mediated CMP.  He is s/p TEE-guided DCCV to NSR but atrial flutter recurred on 2/27.  Still in flutter in 100s.  - Continue amiodarone gtt 30 mg/hr - Will need repeat DCCV on Wednesday.   - Consider ablation down the road, especially if he can stop drinking.  - On bivalirudin with suspected HIT.    5. ETOH abuse: Have strongly encouraged him to quit.  Rehab program would be helpful. Now with withdrawal as above, seems to have resolved.  6. Thrombocytopenia: Platelets have been in 100s range but dropped down to 21K. He  had been on heparin gtt, now on bivalirudin. HIT+.  - Sent SRA to confirm HIT.  - Stopped Librium as there is some risk for thrombocytopenia from this.  - No other med is an obvious culprit. - Continue bivalirudin.    He is adamant he wants to go home and he does not want another procedure. I will follow up with Dr Aundra Dubin.   Length of Stay: [place], NP  05/17/2019, 7:16 AM  Advanced Heart Failure Team Pager [phone] (M-F; [place])  Please contact [facility] for night-coverage after hours (4p -7a )  and weekends on [email or web address]  Patient seen with NP, agree with the above note.    He is insistent on going home today.  He says he cannot psychologically handle being in the hospital any longer. He is alert and oriented today.  He refuses to do cardioversion today.    CVP 6-7, co-ox 58%.  BP elevated.   General: NAD Neck: No JVD, no thyromegaly or thyroid nodule.  Lungs: Clear to auscultation bilaterally with normal respiratory effort. CV: Nondisplaced PMI.  Heart regular S1/S2, no S3/S4, no murmur.  No peripheral edema.   Abdomen: Soft, nontender, no hepatosplenomegaly, no distention.  Skin: Intact without lesions or rashes.  Neurologic: Alert and oriented x 3.  Psych: Normal affect. Extremities: No clubbing or cyanosis.  HEENT: Normal.   I cannot convince Mr Desantos to stay in the hospital.  He is insistent on leaving today.  He is willing to come back to the hospital for TEE-guided DCCV later in the week.  Platelets have trended up (but he was given platelets over the weekend => please avoid giving platelets with HIT).  - As he insists on going home, will stop bivalirudin and start Eliquis.  - Stop IV amiodarone and convert to po.  - Plan for TEE-guided DCCV as outpatient => scheduled for Thursday 2 pm.  He will have to quarantine at home prior. He will need CBC day of DCCV.  - BP high, will stop losartan and use Entresto.  - Can increase spironolactone to 25 mg daily.  -  Will not send home with Lasix for now.  - Continue digoxin.  - Can arrange for cath down the road, could hold apixaban for cath as outpatient > 1 month post-DCCV.  - Needs to avoid ETOH.   I strongly recommended that he stay in the hospital for procedures and to make sure that he will be safe at home (seems very weak) but he refuses.   Cardiac meds for home (will need to make sure he can get them): Eliquis 5 mg bid, Entresto 24/26 bid, spironolactone 25 mg daily, digoxin 0.125 daily, amiodarone 200 mg bid x 2 wks then 200 mg daily after that. Loralie Champagne 05/17/2019 8:36 AM

## 2019-05-17 NOTE — Progress Notes (Signed)
Patient stating he wanted to leave and verbalized understanding of risk to health if leaves today AMA.  Patient signed [facility] paperwork.  Patient able to state date, location and reason he is here.  PICC line d/ced, PIV d/ced. Without problems.  Patient spoke with Dr. Sherral Hammers regarding decision to leave AMA.  Patient dressed and ready to leave.  Patient unable to stand and unable to call for ride to pick him up.  Patient sat back down on bed after trying to walk to door.  Patient stated, "I am not well enough to go".  Agreed to stay in hospital, agreed to put monitor back on and agreed to new IV.  Dr. Sherral Hammers notified.  Patient placed back on monitor.

## 2019-05-18 ENCOUNTER — Encounter (HOSPITAL_COMMUNITY)
Admission: EM | Disposition: A | Discharge status: Skilled Nursing Facility | Payer: Self-pay | Source: Home / Self Care | Attending: Internal Medicine

## 2019-05-18 DIAGNOSIS — I2510 Atherosclerotic heart disease of native coronary artery without angina pectoris: Secondary | ICD-10-CM

## 2019-05-18 DIAGNOSIS — I429 Cardiomyopathy, unspecified: Secondary | ICD-10-CM

## 2019-05-18 DIAGNOSIS — R4182 Altered mental status, unspecified: Secondary | ICD-10-CM

## 2019-05-18 HISTORY — PX: RIGHT/LEFT HEART CATH AND CORONARY ANGIOGRAPHY: CATH118266

## 2019-05-18 LAB — CBC
HCT: 35 % — ABNORMAL LOW (ref 39.0–52.0)
Hemoglobin: 11.8 g/dL — ABNORMAL LOW (ref 13.0–17.0)
MCH: 30.8 pg (ref 26.0–34.0)
MCHC: 33.7 g/dL (ref 30.0–36.0)
MCV: 91.4 fL (ref 80.0–100.0)
Platelets: 106 10*3/uL — ABNORMAL LOW (ref 150–400)
RBC: 3.83 MIL/uL — ABNORMAL LOW (ref 4.22–5.81)
RDW: 14.5 % (ref 11.5–15.5)
WBC: 7.2 10*3/uL (ref 4.0–10.5)
nRBC: 0 % (ref 0.0–0.2)

## 2019-05-18 LAB — D-DIMER, QUANTITATIVE: D-Dimer, Quant: 0.81 ug/mL-FEU — ABNORMAL HIGH (ref 0.00–0.50)

## 2019-05-18 LAB — BASIC METABOLIC PANEL
Anion gap: 11 (ref 5–15)
BUN: 13 mg/dL (ref 6–20)
CO2: 26 mmol/L (ref 22–32)
Calcium: 8.8 mg/dL — ABNORMAL LOW (ref 8.9–10.3)
Chloride: 103 mmol/L (ref 98–111)
Creatinine, Ser: 1.16 mg/dL (ref 0.61–1.24)
GFR calc Af Amer: >60 mL/min (ref >60)
GFR calc non Af Amer: >60 mL/min (ref >60)
Glucose, Bld: 98 mg/dL (ref 70–99)
Potassium: 4.6 mmol/L (ref 3.5–5.1)
Sodium: 140 mmol/L (ref 135–145)

## 2019-05-18 LAB — PHOSPHORUS: Phosphorus: 4.3 mg/dL (ref 2.5–4.6)

## 2019-05-18 LAB — APTT: aPTT: 57 seconds — ABNORMAL HIGH (ref 24–36)

## 2019-05-18 LAB — AMMONIA: Ammonia: 15 umol/L (ref 9–35)

## 2019-05-18 SURGERY — RIGHT/LEFT HEART CATH AND CORONARY ANGIOGRAPHY
Anesthesia: LOCAL

## 2019-05-18 MED ORDER — METOPROLOL TARTRATE 5 MG/5ML IV SOLN
INTRAVENOUS | Status: AC
  Filled 2019-05-18: qty 5

## 2019-05-18 MED ORDER — SACUBITRIL-VALSARTAN 49-51 MG PO TABS
1.0000 | ORAL_TABLET | Freq: Two times a day (BID) | ORAL | Status: DC
  Administered 2019-05-18 – 2019-05-21 (×8): 1 via ORAL
  Filled 2019-05-18 (×9): qty 1

## 2019-05-18 MED ORDER — HALOPERIDOL LACTATE 5 MG/ML IJ SOLN
2.0000 mg | Freq: Once | INTRAMUSCULAR | Status: AC
  Administered 2019-05-18: 2 mg via INTRAMUSCULAR
  Filled 2019-05-18: qty 1

## 2019-05-18 MED ORDER — FENTANYL CITRATE (PF) 100 MCG/2ML IJ SOLN
INTRAMUSCULAR | Status: DC | PRN
  Administered 2019-05-18: 25 ug via INTRAVENOUS

## 2019-05-18 MED ORDER — APIXABAN 5 MG PO TABS
5.0000 mg | ORAL_TABLET | Freq: Two times a day (BID) | ORAL | Status: DC
  Administered 2019-05-18 – 2019-05-26 (×16): 5 mg via ORAL
  Filled 2019-05-18 (×16): qty 1

## 2019-05-18 MED ORDER — IOHEXOL 350 MG/ML SOLN
INTRAVENOUS | Status: DC | PRN
  Administered 2019-05-18: 35 mL via INTRA_ARTERIAL

## 2019-05-18 MED ORDER — MIDAZOLAM HCL 2 MG/2ML IJ SOLN
INTRAMUSCULAR | Status: DC | PRN
  Administered 2019-05-18: 1 mg via INTRAVENOUS

## 2019-05-18 MED ORDER — FENTANYL CITRATE (PF) 100 MCG/2ML IJ SOLN
INTRAMUSCULAR | Status: AC
  Filled 2019-05-18: qty 2

## 2019-05-18 MED ORDER — LIDOCAINE HCL (PF) 1 % IJ SOLN
INTRAMUSCULAR | Status: AC
  Filled 2019-05-18: qty 30

## 2019-05-18 MED ORDER — SODIUM CHLORIDE 0.9 % IV SOLN
INTRAVENOUS | Status: AC | PRN
  Administered 2019-05-18: 125 mL/h via INTRA_ARTERIAL

## 2019-05-18 MED ORDER — VERAPAMIL HCL 2.5 MG/ML IV SOLN
INTRAVENOUS | Status: AC
  Filled 2019-05-18: qty 2

## 2019-05-18 MED ORDER — VERAPAMIL HCL 2.5 MG/ML IV SOLN
INTRAVENOUS | Status: DC | PRN
  Administered 2019-05-18: 10 mL via INTRA_ARTERIAL

## 2019-05-18 MED ORDER — MIDAZOLAM HCL 2 MG/2ML IJ SOLN
INTRAMUSCULAR | Status: AC
  Filled 2019-05-18: qty 2

## 2019-05-18 MED ORDER — LORAZEPAM 2 MG/ML IJ SOLN
0.0000 mg | Freq: Three times a day (TID) | INTRAMUSCULAR | Status: AC
  Administered 2019-05-20 (×2): 2 mg via INTRAVENOUS
  Filled 2019-05-18 (×3): qty 1

## 2019-05-18 MED ORDER — METOPROLOL TARTRATE 5 MG/5ML IV SOLN
INTRAVENOUS | Status: DC | PRN
  Administered 2019-05-18: 5 mg via INTRAVENOUS

## 2019-05-18 MED ORDER — IOHEXOL 350 MG/ML SOLN
INTRAVENOUS | Status: AC
  Filled 2019-05-18: qty 1

## 2019-05-18 MED ORDER — HEPARIN (PORCINE) IN NACL 1000-0.9 UT/500ML-% IV SOLN
INTRAVENOUS | Status: AC
  Filled 2019-05-18: qty 1000

## 2019-05-18 MED ORDER — LIDOCAINE HCL (PF) 1 % IJ SOLN
INTRAMUSCULAR | Status: DC | PRN
  Administered 2019-05-18 (×2): 2 mL

## 2019-05-18 MED ORDER — HALOPERIDOL 1 MG PO TABS: 1.0000 mg | ORAL_TABLET | Freq: Once | ORAL | Status: DC

## 2019-05-18 SURGICAL SUPPLY — 10 items
CATH 5FR JL3.5 JR4 ANG PIG MP (CATHETERS) ×1 IMPLANT
CATH BALLN WEDGE 5F 110CM (CATHETERS) ×1 IMPLANT
GLIDESHEATH SLEND SS 6F .021 (SHEATH) ×1 IMPLANT
GUIDEWIRE .025 260CM (WIRE) ×1 IMPLANT
GUIDEWIRE INQWIRE 1.5J.035X260 (WIRE) IMPLANT
INQWIRE 1.5J .035X260CM (WIRE) ×2
KIT HEART LEFT (KITS) ×2 IMPLANT
PACK CARDIAC CATHETERIZATION (CUSTOM PROCEDURE TRAY) ×2 IMPLANT
SHEATH GLIDE SLENDER 4/5FR (SHEATH) ×1 IMPLANT
TRANSDUCER W/STOPCOCK (MISCELLANEOUS) ×2 IMPLANT
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
## 2019-05-18 NOTE — Interval H&P Note (Signed)
History and Physical Interval Note:  05/18/2019 1:05 PM  Austin Cobb  has presented today for surgery, with the diagnosis of hf.  The various methods of treatment have been discussed with the patient and family. After consideration of risks, benefits and other options for treatment, the patient has consented to  Procedure(s): RIGHT/LEFT HEART CATH AND CORONARY ANGIOGRAPHY (N/A) as a surgical intervention.  The patient's history has been reviewed, patient examined, no change in status, stable for surgery.  I have reviewed the patient's chart and labs.  Questions were answered to the patient's satisfaction.     Phebe Dettmer [employer]
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
## 2019-05-18 NOTE — Progress Notes (Signed)
PROGRESS NOTE    Austin Cobb  [id number] DOB: [birth year] DOA: 05/04/2019 PCP: Patient, No Pcp Per     Brief Narrative:  59 year old BM PMHx Bipolar disorder, Anxiety and Depression tobacco, EtOH and polysubstance abuse,    Presenting with progressive dyspnea for 1 day and exertional dyspnea and fatigue for over 2 months.  Also had paroxysmal nocturnal dyspnea.   In ED, found to be in A.  Flutter with RVR with HR in the 130s.  Also hypertensive with SBP in 170s and DBP in 130s.  UDS negative.  Mildly elevated AST and ALT.BNP 2000.  High-sensitivity troponin 48> 50.  EKG revealed a flutter and LVH.  Lactic acid 2.1> 1.7.  Hgb 11.7.  UA with large LE and many bacteria.  POC COVID-19 test negative.  Cardiology consulted.  Patient admitted for atrial flutter with RVR and unknown type acute CHF.   D-dimer elevated.  CTA chest negative for PE.  Echo with EF of 10 to 15%, global hypokinesis, unknown diastolic function, severely reduced RVEF and severely enlarged RV with RVSP of 28.2 mmHg.   Patient underwent TEE guided cardioversion on 05/06/2019 and converted to NSR. TEE with EF of 10%, moderate MR, no thrombus or significant ASD or PFO.   Advanced heart failure team involved.  Started high-dose IV Lasix but renal function gotten worse.   Subjective: 3/9 attempted to see patient 2 times in cardiac Cath Lab    Assessment & Plan:   Active Problems:   Atrial flutter with rapid ventricular response (HCC)   Acute heart failure (HCC)   Essential hypertension   Acute on chronic combined systolic and diastolic CHF (congestive heart failure) (HCC)   Alcohol withdrawal (HCC)   Acute metabolic encephalopathy   AKI (acute kidney injury) ([place])   Elevated d-dimer   Demand ischemia (HCC)   Elevated troponin   Pleural effusion   Polysubstance abuse (HCC)   E. coli UTI   Agitation   Noncompliance   Yeast UTI  Acute systolic CHF -Strict in and out +4.6 L -Daily weight Filed  Weights   05/16/19 0434 05/17/19 0652 05/18/19 0609  Weight: 58.4 kg 59.4 kg 56.1 kg  -Cardiology consulted -Amiodarone drip per cardiology -Digoxin 0.125 mg daily -Hydralazine PRN -Aldactone 12.5 mg daily -Milrinone drip per cardiology (held) -Metoprolol 2.5 mg PRN -3/6 Lasix IV 20 mg post transfusion of platelets -3/7 Lasix IV 20 mg posttransfusion platelets -3/7 discussed case with Dr. Carlyle Dolly cardiology platelets will need to be> 100 in order to perform cardiac catheterization/DCCV  A flutter with RVR - CHA2DS2-VASc score of 2. -Converted to NSR but returned to A. fib on 2/27 -See CHF -Repeat DCCV once acute encephalopathy resolved and platelets recover -Bivalirudin secondary to being HIT positive  EtOH withdrawal/Acute Metabolic Encephalopathy -On admission did not obtain alcohol level -Urine tox screen negative -CIWA protocol.  NOTE Ativan substituted for Librium secondary to increasing thrombocytopenia -3/4 appears to be improving, mittens now off -3/5 Recent patient became agitated attempted to leave AMA, pulling out IV lines. -3/5 Restarted CIWA protocol IV, Haldol scheduled  Acute kidney injury (baseline Cr 1.1) Recent Labs  Lab 05/14/19 0442 05/15/19 0444 05/16/19 0456 05/17/19 0500 05/18/19 0327  CREATININE 1.28* 1.09 1.19 1.11 1.16  -Baseline -Hold diuretics -Hold nephrotoxic medication -Monitor closely  Elevated D-dimer Results for [place], Cobb (MRN [id number]) as of 05/14/2019 07:18  Ref. Range 05/12/2016 11:04 05/04/2019 21:44  D-Dimer, Quant Latest Ref Range: 0.00 - 0.50 ug/mL-FEU 1.33 (H) 1.54 (H)  -  Trend -CTA negative for PE, moderate bilateral pleural effusion see below  Elevated troponin/Demand ischemia Results for [place], Cobb (MRN [id number]) as of 05/14/2019 07:18  Ref. Range 05/04/2019 15:40 05/04/2019 17:19  Troponin I (High Sensitivity) Latest Ref Range: <18 ng/L 48 (H) 50 (H)  -Most likely demand ischemia from A-flutter and  CHF  Essential HTN -See CHF  Elevated liver enzymes  -Most likely secondary to EtOH abuse, CHF, and Amiodarone -Elevated liver enzymes: Likely due to alcohol, CHF and possibly due to amiodarone.   -Acute hepatitis panel and HIV negative. -3/6 liver enzymes improving   Bilateral pleural effusion -3/5 PCXR; improving aeration see results below  UTI positive E. Coli/ UTI positive yeast -Appears to have completed course of antibiotics -3/5 repeat urine culture positive yeast -3/6 Fluconazole for 2 weeks  Polysubstance abuse -2/23 urine tox screen negative -Cessation counseling when able -Consult CSW for resources -Continue CIWA protocol  Agitation -3/8 increase Haldol 1 mg TID; may need to titrate up  Tobacco abuse -Smokes 1 PPD -Cessation encouraged. -Nicotine patch   Generalized weakness/debility -PT/OT eval  Euthyroid sick syndrome:  -TSH 5.4.  Free T4 1.68 -Repeat thyroid panel in 4 to 6 weeks outpatient.  Hypomagnesmia -Magnesium goal> 2  Hypokalemia -Potassium goal>4  Thrombopenia/HIT syndrome Results for [place], Cobb (MRN [id number]) as of 05/16/2019 09:18  Ref. Range 05/13/2019 04:52 05/13/2019 11:42 05/14/2019 04:42 05/15/2019 04:44 05/16/2019 04:56  Platelets Latest Ref Range: 150 - 400 K/uL 23 (LL) 21 (LL) 21 (LL) 27 (LL) 76 (L)  -Avoid all heparin products -Positive HIT antibody positive -3/5 type and cross, if platelets fall any further or any signs of bleeding will transfuse immediately -3/6 appears platelets may be starting to trend up, however will take significant amount of time to recover to point where cardiology can perform multiple invasive procedures.. -3/6 transfuse 1 unit platelets -3/7 transfuse 1 unit platelets -Although at some increased risk of consuming platelets, and causing clots after waiting almost a week deemed appropriate in order to expedite patient's cardiac work-up given his EXTREMELY poor cardiac  function  Noncompliance -Patient requested to leave AMA.  Counseled patient that if he attempted to leave given his poor cardiac condition he would DIE before reaching home, at that point patient changed his mind and requested that we help him. -3/8 consult to Psychiatry patient again initially attempted to leave AMA after cardiology explained the patient he should remain in the hospital for procedures and that it was unsafe for him to go home.  I then spoke with patient and explained that he would most likely DIE if he left the hospital without completing treatment, and he decided to stay.  Is patient competent to make decisions concerning his medical care?       DVT prophylaxis: Bivalirudin  Code Status: Full Family Communication: 3/6 spoke with Mr Evalina Field who is listed as patient's POC and per patient makes his healthcare decisions.  Explained need for transfusion of platelets in order to allow cardiologist to perform procedures to determine definitive treatment for patient Mr Baucino agreed to procedure. Disposition Plan: TBD per cardiology   Consultants:  Cardiology   Procedures/Significant Events:  2/24 CTA chest PE protocol; similar: Negative for PE-moderate bilateral pleural effusions RIGHT>>> LEFT 2/25 Echocardiogram;LVEF; <20%. The left-ventricle has severely decreased function.  -The left ventricle demonstrates global hypokinesis.  -Left ventricular internal cavity size was mildly to moderately dilated.  -Mitral valve Mild to moderate regurgitation.  -3/5 CXR;-cardiomegaly with improved aeration of the lungs  and resolved pleural effusions -3/6 transfuse 1 unit platelets -3/7 transfuse 1 unit platelets  I have personally reviewed and interpreted all radiology studies and my findings are as above.  VENTILATOR SETTINGS:    Cultures 2/23 HIV negative 2/23 urine positive E. coli 2/24 SARS coronavirus 2 negative 2/24 acute hepatitis panel negative 3/5 urine  pending     Antimicrobials: Anti-infectives (From admission, onward)   Start     Dose/Rate Stop   05/08/19 1400  amoxicillin (AMOXIL) capsule 500 mg     500 mg 05/09/19 2159   05/05/19 1900  cefTRIAXone (ROCEPHIN) 1 g in sodium chloride 0.9 % 100 mL IVPB  Status:  Discontinued     1 g 200 mL/hr over 30 Minutes 05/08/19 0751   05/04/19 1730  cefTRIAXone (ROCEPHIN) 1 g in sodium chloride 0.9 % 100 mL IVPB     1 g 200 mL/hr over 30 Minutes 05/04/19 2005       Devices    LINES / TUBES:      Continuous Infusions: . sodium chloride    . sodium chloride    . sodium chloride    . bivalirudin (ANGIOMAX) infusion 0.5 mg/mL (Non-ACS indications) 0.11 mg/kg/hr (05/17/19 2113)     Objective: Vitals:   05/17/19 2115 05/18/19 0609 05/18/19 0612 05/18/19 0615  BP: (!) 148/92  (!) 172/117   Pulse:      Resp:      Temp:    97.6 F (36.4 C)  TempSrc:    Axillary  SpO2:      Weight:  56.1 kg    Height:        Intake/Output Summary (Last 24 hours) at 05/18/2019 0925 Last data filed at 05/18/2019 0804 Gross per 24 hour  Intake 164.04 ml  Output 850 ml  Net -685.96 ml   Filed Weights   05/16/19 0434 05/17/19 0652 05/18/19 0609  Weight: 58.4 kg 59.4 kg 56.1 kg   Physical Exam:  Attempted to see patient 2 times in cardiac Cath Lab     Data Reviewed: Care during the described time interval was provided by me .  I have reviewed this patient's available data, including medical history, events of note, physical examination, and all test results as part of my evaluation.  CBC: Recent Labs  Lab 05/14/19 0442 05/15/19 0444 05/16/19 0456 05/17/19 0800 05/18/19 0327  WBC 5.9 7.1 6.6 8.2 7.2  HGB 11.8* 12.3* 11.3* 11.3* 11.8*  HCT 35.2* 37.9* 34.3* 34.1* 35.0*  MCV 91.4 92.7 92.5 91.2 91.4  PLT 21* 27* 76* 99* [id number]*   Basic Metabolic Panel: Recent Labs  Lab 05/13/19 0452 05/13/19 0452 05/14/19 0442 05/15/19 0444 05/16/19 0456 05/17/19 0500 05/18/19 0327  NA 135    < > 139 139 140 137 140  K 4.0   < > 3.8 4.2 3.7 4.4 4.6  CL 104   < > 103 107 103 100 103  CO2 24   < > 26 24 27 28 26   GLUCOSE 89   < > 136* 98 108* 84 98  BUN 9   < > 15 12 10 12 13   CREATININE 1.05   < > 1.28* 1.09 1.19 1.11 1.16  CALCIUM 8.2*   < > 8.4* 8.7* 8.5* 8.8* 8.8*  MG 2.0  --  1.9 1.7 1.7 1.9  --   PHOS 3.7   < > 4.4 4.1 5.0* 4.7* 4.3   < > = values in this interval not displayed.   GFR: Estimated  Creatinine Clearance: 54.4 mL/min (by C-G formula based on SCr of 1.16 mg/dL). Liver Function Tests: Recent Labs  Lab 05/13/19 0452 05/14/19 0442 05/15/19 0444 05/16/19 0456 05/17/19 0500  AST 111* 73* 59* 49* 43*  ALT 650* 479* 431* 318* 249*  ALKPHOS 76 75 77 74 71  BILITOT 2.6* 2.0* 1.5* 1.0 0.7  PROT 5.6* 5.6* 6.1* 6.1* 6.3*  ALBUMIN 2.7* 2.7* 3.0* 3.0* 3.1*   No results for input(s): LIPASE, AMYLASE in the last 168 hours. Recent Labs  Lab 05/14/19 0802 05/15/19 0441 05/16/19 0456 05/17/19 0500 05/18/19 0327  AMMONIA 24 21 19 18 15    Coagulation Profile: No results for input(s): INR, PROTIME in the last 168 hours. Cardiac Enzymes: No results for input(s): CKTOTAL, CKMB, CKMBINDEX, TROPONINI in the last 168 hours. BNP (last 3 results) No results for input(s): PROBNP in the last 8760 hours. HbA1C: No results for input(s): HGBA1C in the last 72 hours. CBG: No results for input(s): GLUCAP in the last 168 hours. Lipid Profile: No results for input(s): CHOL, HDL, LDLCALC, TRIG, CHOLHDL, LDLDIRECT in the last 72 hours. Thyroid Function Tests: No results for input(s): TSH, T4TOTAL, FREET4, T3FREE, THYROIDAB in the last 72 hours. Anemia Panel: No results for input(s): VITAMINB12, FOLATE, FERRITIN, TIBC, IRON, RETICCTPCT in the last 72 hours. Sepsis Labs: No results for input(s): PROCALCITON, LATICACIDVEN in the last 168 hours.  Recent Results (from the past 240 hour(s))  Culture, Urine     Status: Abnormal   Collection Time: 05/14/19  7:49 AM   Specimen:  Urine, Random  Result Value Ref Range Status   Specimen Description URINE, RANDOM  Final   Special Requests   Final    NONE Performed at [facility], [address], [place], [place] [address]    Culture >=100,000 COLONIES/mL YEAST (A)  Final   Report Status 05/15/2019 FINAL  Final         Radiology Studies: No results found.      Scheduled Meds: . amiodarone  200 mg Oral BID  . digoxin  0.125 mg Oral Daily  . feeding supplement (ENSURE ENLIVE)  237 mL Oral 4x daily  . fluconazole  200 mg Oral Daily  . folic acid  1 mg Intravenous Daily  . haloperidol lactate  1 mg Intravenous TID  . lactulose  20 g Oral BID   Or  . lactulose  300 mL Rectal BID  . LORazepam  0-4 mg Intravenous Q8H  . multivitamin with minerals  1 tablet Oral Daily  . nicotine  21 mg Transdermal Daily  . sacubitril-valsartan  1 tablet Oral BID  . sodium chloride flush  10-40 mL Intracatheter Q12H  . sodium chloride flush  3 mL Intravenous Q12H  . sodium chloride flush  3 mL Intravenous Q12H  . sodium chloride flush  3 mL Intravenous Q12H  . spironolactone  25 mg Oral Daily  . thiamine  100 mg Oral Daily   Or  . thiamine  100 mg Intravenous Daily   Continuous Infusions: . sodium chloride    . sodium chloride    . sodium chloride    . bivalirudin (ANGIOMAX) infusion 0.5 mg/mL (Non-ACS indications) 0.11 mg/kg/hr (05/17/19 2113)     LOS: 14 days    Time spent:40 min    Shaira Sova, Geraldo Docker, MD [facility] Pager [phone]  If 7PM-7AM, please contact night-coverage [email or web address] Password [facility] 05/18/2019, 9:25 AM

## 2019-05-18 NOTE — Progress Notes (Addendum)
[facility] for Heparin >Bivalirudin > apixaban >bivalirudin Indication: atrial fibrillation  Allergies  Allergen Reactions  . Heparin     R/o HIT 3/3    Patient Measurements: Height: 5\' 9"  (175.3 cm) Weight: 123 lb 11.2 oz (56.1 kg) IBW/kg (Calculated) : 70.7   Vital Signs: Temp: 97.6 F (36.4 C) (03/09 0615) Temp Source: Axillary (03/09 0615) BP: 172/117 (03/09 0612)  Labs: Recent Labs    05/16/19 0456 05/16/19 0456 05/17/19 0500 05/17/19 0800 05/18/19 0327 05/18/19 0816  HGB 11.3*   < >  --  11.3* 11.8*  --   HCT 34.3*  --   --  34.1* 35.0*  --   PLT 76*  --   --  99* 106*  --   APTT 57*  --  59*  --   --  57*  CREATININE 1.19  --  1.11  --  1.16  --    < > = values in this interval not displayed.    Estimated Creatinine Clearance: 54.4 mL/min (by C-G formula based on SCr of 1.16 mg/dL).  Assessment: 59 year old M w/ new aFlutter (CHADS2VASc = 0-1 for possible HTN), started on apixaban 2/24, changed to heparin then changed to bival for r/o HIT.  APTT came back therapeutic this morning at 57, on bivalirudin@0 .11 mg/kg/hr. Hgb 11.8, plt 106 (increasing). SRA was negative. Plan for cath today.   Goal of Therapy:  APTT 50-80 sec Monitor platelets by anticoagulation protocol: Yes   Plan:  Continue bivalirudin at 0.11 mg/kg/hr F/u after cath and for plans to transition to apixaban   Antonietta Jewel, PharmD, [place] Pharmacist  Phone: [phone]  Please check AMION for all [place] phone numbers After 10:00 PM, call [place] [phone]  05/18/2019 9:51 AM   ADDENDUM Change to apixaban 5 mg twice starting tonight post-cath. Monitor CBC, and for s/sx of bleeding.   Antonietta Jewel, PharmD, [place] Clinical Pharmacist

## 2019-05-19 LAB — BASIC METABOLIC PANEL
Anion gap: 11 (ref 5–15)
BUN: 13 mg/dL (ref 6–20)
CO2: 26 mmol/L (ref 22–32)
Calcium: 9.1 mg/dL (ref 8.9–10.3)
Chloride: 102 mmol/L (ref 98–111)
Creatinine, Ser: 1 mg/dL (ref 0.61–1.24)
GFR calc Af Amer: >60 mL/min (ref >60)
GFR calc non Af Amer: >60 mL/min (ref >60)
Glucose, Bld: 92 mg/dL (ref 70–99)
Potassium: 4.4 mmol/L (ref 3.5–5.1)
Sodium: 139 mmol/L (ref 135–145)

## 2019-05-19 LAB — POCT I-STAT EG7
Acid-Base Excess: 2 mmol/L (ref 0.0–2.0)
Acid-Base Excess: 4 mmol/L — ABNORMAL HIGH (ref 0.0–2.0)
Bicarbonate: 27 mmol/L (ref 20.0–28.0)
Bicarbonate: 29 mmol/L — ABNORMAL HIGH (ref 20.0–28.0)
Calcium, Ion: 1.17 mmol/L (ref 1.15–1.40)
Calcium, Ion: 1.23 mmol/L (ref 1.15–1.40)
HCT: 36 % — ABNORMAL LOW (ref 39.0–52.0)
HCT: 37 % — ABNORMAL LOW (ref 39.0–52.0)
Hemoglobin: 12.2 g/dL — ABNORMAL LOW (ref 13.0–17.0)
Hemoglobin: 12.6 g/dL — ABNORMAL LOW (ref 13.0–17.0)
O2 Saturation: 72 %
O2 Saturation: 76 %
Potassium: 3.9 mmol/L (ref 3.5–5.1)
Potassium: 4.1 mmol/L (ref 3.5–5.1)
Sodium: 140 mmol/L (ref 135–145)
Sodium: 141 mmol/L (ref 135–145)
TCO2: 28 mmol/L (ref 22–32)
TCO2: 30 mmol/L (ref 22–32)
pCO2, Ven: 42.1 mmHg — ABNORMAL LOW (ref 44.0–60.0)
pCO2, Ven: 44.3 mmHg (ref 44.0–60.0)
pH, Ven: 7.414 (ref 7.250–7.430)
pH, Ven: 7.425 (ref 7.250–7.430)
pO2, Ven: 37 mmHg (ref 32.0–45.0)
pO2, Ven: 40 mmHg (ref 32.0–45.0)

## 2019-05-19 LAB — CBC WITH DIFFERENTIAL/PLATELET
Abs Immature Granulocytes: 0.02 10*3/uL (ref 0.00–0.07)
Basophils Absolute: 0.1 10*3/uL (ref 0.0–0.1)
Basophils Relative: 2 %
Eosinophils Absolute: 0.3 10*3/uL (ref 0.0–0.5)
Eosinophils Relative: 4 %
HCT: 37.5 % — ABNORMAL LOW (ref 39.0–52.0)
Hemoglobin: 12.7 g/dL — ABNORMAL LOW (ref 13.0–17.0)
Immature Granulocytes: 0 %
Lymphocytes Relative: 22 %
Lymphs Abs: 1.4 10*3/uL (ref 0.7–4.0)
MCH: 30.9 pg (ref 26.0–34.0)
MCHC: 33.9 g/dL (ref 30.0–36.0)
MCV: 91.2 fL (ref 80.0–100.0)
Monocytes Absolute: 0.6 10*3/uL (ref 0.1–1.0)
Monocytes Relative: 9 %
Neutro Abs: 4.1 10*3/uL (ref 1.7–7.7)
Neutrophils Relative %: 63 %
Platelets: 130 10*3/uL — ABNORMAL LOW (ref 150–400)
RBC: 4.11 MIL/uL — ABNORMAL LOW (ref 4.22–5.81)
RDW: 14.4 % (ref 11.5–15.5)
WBC: 6.5 10*3/uL (ref 4.0–10.5)
nRBC: 0 % (ref 0.0–0.2)

## 2019-05-19 LAB — CBC
HCT: 36.9 % — ABNORMAL LOW (ref 39.0–52.0)
Hemoglobin: 12.5 g/dL — ABNORMAL LOW (ref 13.0–17.0)
MCH: 30.6 pg (ref 26.0–34.0)
MCHC: 33.9 g/dL (ref 30.0–36.0)
MCV: 90.4 fL (ref 80.0–100.0)
Platelets: 126 10*3/uL — ABNORMAL LOW (ref 150–400)
RBC: 4.08 MIL/uL — ABNORMAL LOW (ref 4.22–5.81)
RDW: 14.3 % (ref 11.5–15.5)
WBC: 6.9 10*3/uL (ref 4.0–10.5)
nRBC: 0 % (ref 0.0–0.2)

## 2019-05-19 LAB — PHOSPHORUS: Phosphorus: 4.7 mg/dL — ABNORMAL HIGH (ref 2.5–4.6)

## 2019-05-19 LAB — D-DIMER, QUANTITATIVE: D-Dimer, Quant: 0.9 ug/mL-FEU — ABNORMAL HIGH (ref 0.00–0.50)

## 2019-05-19 LAB — MAGNESIUM: Magnesium: 1.7 mg/dL (ref 1.7–2.4)

## 2019-05-19 LAB — AMMONIA: Ammonia: 29 umol/L (ref 9–35)

## 2019-05-19 MED ORDER — ONDANSETRON HCL 4 MG/2ML IJ SOLN: 4.0000 mg | Freq: Four times a day (QID) | INTRAMUSCULAR | Status: DC | PRN

## 2019-05-19 MED ORDER — LABETALOL HCL 5 MG/ML IV SOLN: 10.0000 mg | INTRAVENOUS | Status: AC | PRN

## 2019-05-19 MED ORDER — MAGNESIUM SULFATE 2 GM/50ML IV SOLN
2.0000 g | Freq: Once | INTRAVENOUS | Status: AC
  Administered 2019-05-19: 2 g via INTRAVENOUS
  Filled 2019-05-19: qty 50

## 2019-05-19 MED ORDER — SODIUM CHLORIDE 0.9% FLUSH: 3.0000 mL | INTRAVENOUS | Status: DC | PRN

## 2019-05-19 MED ORDER — SODIUM CHLORIDE 0.9 % IV SOLN: 250.0000 mL | INTRAVENOUS | Status: DC | PRN

## 2019-05-19 MED ORDER — ACETAMINOPHEN 325 MG PO TABS: 650.0000 mg | ORAL_TABLET | ORAL | Status: DC | PRN

## 2019-05-19 MED ORDER — SODIUM CHLORIDE 0.9 % IV SOLN: INTRAVENOUS | Status: DC

## 2019-05-19 MED ORDER — APIXABAN 5 MG PO TABS: 5.0000 mg | ORAL_TABLET | Freq: Two times a day (BID) | ORAL | Status: DC

## 2019-05-19 MED ORDER — SODIUM CHLORIDE 0.9% FLUSH
3.0000 mL | Freq: Two times a day (BID) | INTRAVENOUS | Status: DC
  Administered 2019-05-19 – 2019-05-26 (×13): 3 mL via INTRAVENOUS

## 2019-05-19 MED ORDER — HYDRALAZINE HCL 20 MG/ML IJ SOLN: 10.0000 mg | INTRAMUSCULAR | Status: AC | PRN

## 2019-05-19 MED FILL — Heparin Sod (Porcine)-NaCl IV Soln 1000 Unit/500ML-0.9%: INTRAVENOUS | Qty: 1000 | Status: AC

## 2019-05-19 NOTE — TOC Initial Note (Signed)
Transition of Care [facility]) - Initial/Assessment Note    Patient Details  Name: Austin Cobb MRN: [id number] Date of Birth: [birth year]  Transition of Care [facility]) CM/SW Contact:    Gabrielle Dare Phone Number: 05/19/2019, 4:32 PM  Clinical Narrative:                 CSW spoke with pt by phone.  CSW introduced self and explained role.  CSW spoke about PT recommendation by PT.  Pt is agreeable for SNF role.  CSW discussed barriers concerning SNF placement due to alcohol and drug abuse.  CSW explored possibility going home.  Pt states "my people will be here". CSW clarify with pt who that would be.  Pt stated she is his roommate.  Pt and pt's roommate are agreeable to SNF.  Pt's attorney is listed as contact.  Pt has stated that CSW can contact his roommate [name] at [phone].  Expected Discharge Plan: Skilled Nursing Facility Barriers to Discharge: Active Substance Use - Placement, Continued Medical Work up, Ship broker, SNF Pending bed offer   Patient Goals and CMS Choice Patient states their goals for this hospitalization and ongoing recovery are:: Pt's roommate stated " to be home walking, talking and alcohol free" CMS Medicare.gov Compare Post Acute Care list provided to:: Other (Comment Required)(Gave web address www.medicare.gov to roommate to review) Choice offered to / list presented to : (web address was given to roommate)  Expected Discharge Plan and Services Expected Discharge Plan: [employer] In-house Referral: Clinical Social Work     Living arrangements for the past 2 months: Mobile Home                                      Prior Living Arrangements/Services Living arrangements for the past 2 months: Mobile Home Lives with:: Roommate Patient language and need for interpreter reviewed:: No Do you feel safe going back to the place where you live?: Yes      Need for Family Participation in Patient Care: Yes (Comment) Care giver  support system in place?: Yes (comment)   Criminal Activity/Legal Involvement Pertinent to Current Situation/Hospitalization: No - Comment as needed  Activities of Daily Living Home Assistive Devices/Equipment: None ADL Screening (condition at time of admission) Patient's cognitive ability adequate to safely complete daily activities?: Yes Is the patient deaf or have difficulty hearing?: No Does the patient have difficulty seeing, even when wearing glasses/contacts?: No Does the patient have difficulty concentrating, remembering, or making decisions?: No Patient able to express need for assistance with ADLs?: Yes Does the patient have difficulty dressing or bathing?: No Independently performs ADLs?: Yes (appropriate for developmental age) Does the patient have difficulty walking or climbing stairs?: No Weakness of Legs: None Weakness of Arms/Hands: None  Permission Sought/Granted Permission sought to share information with : Case Manager, Customer service manager, Family Supports Permission granted to share information with : Yes, Verbal Permission Granted  Share Information with NAME: [name]  Permission granted to share info w AGENCY: SNF's  Permission granted to share info w Relationship: Roommate  Permission granted to share info w Contact Information: Lurline Hare [phone]  Emotional Assessment Appearance:: Appears stated age Attitude/Demeanor/Rapport: Unable to Assess Affect (typically observed): Unable to Assess Orientation: : Oriented to Self, Oriented to Place, Oriented to Situation Alcohol / Substance Use: Alcohol Use, Illicit Drugs Psych Involvement: No (comment)  Admission diagnosis:  SOB (shortness  of breath) [R06.02] Atrial flutter with rapid ventricular response (HCC) [I48.92] Atrial flutter, unspecified type [facility]) [I48.92] Patient Active Problem List   Diagnosis Date Noted  . Yeast UTI 05/15/2019  . Alcohol withdrawal ([place]) 05/14/2019  . Acute  metabolic encephalopathy [id number]  . AKI (acute kidney injury) ([place]) 05/14/2019  . Elevated d-dimer 05/14/2019  . Demand ischemia ([place]) 05/14/2019  . Elevated troponin 05/14/2019  . Pleural effusion 05/14/2019  . Polysubstance abuse ([place]) 05/14/2019  . E. coli UTI 05/14/2019  . Agitation 05/14/2019  . Noncompliance 05/14/2019  . Acute on chronic combined systolic and diastolic CHF (congestive heart failure) ([place])   . Atrial flutter with rapid ventricular response ([place]) 05/04/2019  . Acute heart failure ([place])   . Essential hypertension   . Herpes zoster without complication 0000000  . History of fall 07/23/2017  . Prostatitis 06/23/2014  . Serrated adenoma of colon 05/13/2012  . Anal fissure 05/13/2012  . Tobacco abuse 05/13/2012  . 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 Chronic shoulder pain 01/03/2012  . Neck pain, chronic 01/03/2012  . Mood disorder ([place]) 01/03/2012  . Depression with anxiety 01/03/2012   PCP:  Patient, No Pcp Per Pharmacy:   [facility] [address] [place] [address] Phone: [phone] Fax: [phone]  [facility] [place] - [address] [place] [address] Phone: [phone] Fax: [phone]  [facility] [phone] [place], [place] [address] Phone: [phone] Fax: [phone]  [facility] [phone] - [place], [place] - [address] [place] [address] Phone: [phone] Fax: [phone]  [facility] [place], [place] - [address] [place] [address] Phone: [phone] Fax: [phone]     Social Determinants of Health (SDOH) Interventions    Readmission Risk Interventions No flowsheet data  found.

## 2019-05-19 NOTE — Progress Notes (Signed)
Nutrition Follow-up  DOCUMENTATION CODES:   Severe malnutrition in context of social or environmental circumstances  INTERVENTION:   Continue Ensure Enlive po QID, each supplement provides 350 kcal and 20 grams of protein  Continue MVI daily    NUTRITION DIAGNOSIS:   Severe Malnutrition related to social / environmental circumstances as evidenced by energy intake < 75% for > or equal to 1 month, mild fat depletion, severe fat depletion, mild muscle depletion, severe muscle depletion, percent weight loss.   GOAL:   Patient will meet greater than or equal to 90% of their needs  Progressing.  MONITOR:   PO intake, Supplement acceptance, Weight trends, Labs, I & O's  REASON FOR ASSESSMENT:   Consult Assessment of nutrition requirement/status  ASSESSMENT:   Pt with a PMH significant for tobacco, EtOH and polysubstance abuse, bipolar disorder, anxiety and depression presented with progressive dyspnea and fatigue. Pt admitted for atrial fluter with RVR and acute CHF  Per MD, cardiology will attempt TEE and DCCV tomorrow.  Pt noted to have attempted to leave AMA on 05/14/19.   Pt states appetite is "alright." Prior to admission, he reports eating 2 meals per day, each smaller in serving size than what he is receiving while admitted. RD unable to obtain more details regarding patient's diet history at this time. Pt indicates that he enjoys Ensure.   PO Intake: 0-100% x last 8 recorded meals (53% average intake)  Admission weight: 135.9 lbs Current weight: 123.7 lbs This indicated an 8.9% wt loss x2 weeks, which is significant for time frame. No weight history available from the last year.  Medications reviewed and include: ensure Enlive QID, Folic acid, Chronulac, MVI, aldactone, thiamine  Labs reviewed: Phosphorus 4.7 (H)  UOP: 9107ml x24 hours I/O: +2,658.78ml since admit   NUTRITION - FOCUSED PHYSICAL EXAM:    Most Recent Value  Orbital Region  No depletion  Upper  Arm Region  Severe depletion  Thoracic and Lumbar Region  Severe depletion  Buccal Region  Mild depletion  Temple Region  Mild depletion  Clavicle Bone Region  Severe depletion  Clavicle and Acromion Bone Region  Severe depletion  Scapular Bone Region  Severe depletion  Dorsal Hand  No depletion  Patellar Region  Severe depletion  Anterior Thigh Region  Severe depletion  Posterior Calf Region  Severe depletion  Edema (RD Assessment)  None  Hair  Reviewed  Eyes  Reviewed  Mouth  Reviewed  Skin  Reviewed  Nails  Reviewed       Diet Order:   Diet Order            Diet Heart Room service appropriate? Yes; Fluid consistency: Thin  Diet effective now              EDUCATION NEEDS:   No education needs have been identified at this time  Skin:  Skin Assessment: Reviewed RN Assessment  Last BM:  3/10  Height:   Ht Readings from Last 1 Encounters:  05/06/19 5\' 9"  (1.753 m)    Weight:   Wt Readings from Last 1 Encounters:  05/18/19 56.1 kg    BMI:  Body mass index is 18.27 kg/m.  Estimated Nutritional Needs:   Kcal:  1900-2100  Protein:  95-110 grams  Fluid:  >1.9L/d or per MD    Larkin Ina, MS, RD, LDN RD pager number and weekend/on-call pager number located in [place].

## 2019-05-19 NOTE — Progress Notes (Signed)
[facility] PROGRESS NOTE    Progress Note  KINDRICK KRAAI  [id number] DOB: Nov 16, 1960 DOA: 05/04/2019 PCP: Patient, No Pcp Per     Brief Narrative:   MACK CZAJA is an 59 y.o. male past medical history of anxiety disorder depression alcohol abuse and polysubstance abuse comes in with 1 progressive dyspnea on exertion and fatigue over 2 months.  In the ED she was found to be in atrial flutter with RVR, UDS was negative mildly elevated AST and ALT, BNP greater than 2000 EKG revealed a flutter with LVH, with elevated lactic acid COVID-19 test was negative cardiology was consulted for a flutter with RVR and acute heart failure. CT angio of the chest was negative for PE, 2D echo showed an ejection fraction of 10% with global hypokinesia.  And cardioversion on 05/06/2019.  He advanced heart failure team was consulted and she was started on IV Lasix.  Assessment/Plan:   Acute systolic heart failure: 2D echo on admission showed an EF of 15% with severe right ventricular dysfunction.  Etiology has remained unclear.,  But could be tachycardia mediated and longstanding heavy use of alcohol as well as viral cardiomyopathy. At the advanced heart failure team was consulted, she was started on amiodarone drip. Cardiac cath on 05/18/2019 showed no CAD. He had DCCV but with recurrent atrial flutter again on 05/08/2019, till date he remains in flutter in the low 100s. Right heart cath on 05/18/2019 showed good cardiac output. He is currently on Aldactone, Entresto and digoxin. She is currently on digoxin, hydralazine as needed, Aldactone 12.5, milrinone drip (which was held) and on IV Lasix.  A flutter with RVR: With a CHADS2-VASc > 2. Initially started on amiodarone drip which has now been transitioned to oral. Due to the concern of HIT she was started on bivalirudin. Cardiology will attempt TEE and DCCV tomorrow (they will repeat a TEE as he has not been continuously on anticoagulation, he  is currently on apixaban) Continue apixaban.  Alcohol withdrawal/acute metabolic encephalopathy/delirium tremens: Drug screen was negative, she was monitored with CIWA protocol. She was started on Librium for encephalopathy, she attempted to leave AMA on 05/14/2019 as she became agitated. This morning her mentation is clearing.  Question of some degree of baseline dementia. Continue to use Haldol IV as needed.  Acute kidney injury: With a baseline creatinine of 1.1: Suspect low cardiac output, he also had a CT angio on admission which may be playing a role this has resolved.  Thrombocytopenia: Question due to HIT he has been taken off heparin and transition to bivalirudin now on Eliquis.  Platelets are recovering.  Elevated troponins: Likely due to demand ischemia cardiac cath showed no obstructive disease.  Elevated LFTs: Likely due to alcohol abuse.  Liver enzymes slowly improving.  Bilateral pleural effusions: Noted.  Polysubstance abuse: Urine drug screen negative, cultures have remained negative he received 2 weeks of fluconazole as on 05/14/2019 his urine culture was positive for yeast.  Sick euthyroid syndrome: Thyroid function test will need to be repeated 6 weeks an outpatient.  Electrolyte imbalance: Hypomagnesemia and hypokalemia: Try to keep magnesium greater than 2 potassium greater than 4.   DVT prophylaxis: Apixiban Family Communication:none Disposition Plan/Barrier to D/C:   Code Status:     Code Status Orders  (From admission, onward)         Start     Ordered   05/04/19 1905  Full code  Continuous     05/04/19 1904  Code Status History    This patient has a current code status but no historical code status.   Advance Care Planning Activity        IV Access:    Peripheral IV   Procedures and diagnostic studies:   CARDIAC CATHETERIZATION  Result Date: 05/18/2019 1. Low filling pressures. 2. Preserved cardiac output. 3.  Nonobstructive mild coronary disease. Nonischemic cardiomyopathy.  Will start Eliquis this evening.     Medical Consultants:    None.  Anti-Infectives:   None  Subjective:    ADELBERT POLCYN has no new complaints.  Objective:    Vitals:   05/19/19 0436 05/19/19 0515 05/19/19 0640 05/19/19 0840  BP: (!) 147/77 (!) 145/72 (!) 127/58   Pulse: 85 91 78 (!) 101  Resp: 16     Temp: 97.6 F (36.4 C)     TempSrc: Oral     SpO2: 100%     Weight:      Height:       SpO2: 100 % O2 Flow Rate (L/min): 2 L/min   Intake/Output Summary (Last 24 hours) at 05/19/2019 0916 Last data filed at 05/19/2019 0655 Gross per 24 hour  Intake 129 ml  Output 700 ml  Net -571 ml   Filed Weights   05/16/19 0434 05/17/19 0652 05/18/19 0609  Weight: 58.4 kg 59.4 kg 56.1 kg    Exam: General exam: In no acute distress. Respiratory system: Good air movement and clear to auscultation. Cardiovascular system: S1 & S2 heard, RRR. Gastrointestinal system: Abdomen is nondistended, soft and nontender.  Central nervous system: Alert and oriented. Extremities: No pedal edema. Skin: No rashes, lesions or ulcers Psychiatry: No insight on medical condition.   Data Reviewed:    Labs: Basic Metabolic Panel: Recent Labs  Lab 05/14/19 0442 05/14/19 0442 05/15/19 0444 05/15/19 0444 05/16/19 0456 05/16/19 0456 05/17/19 0500 05/17/19 0500 05/18/19 0327 05/18/19 0327 05/18/19 1320 05/18/19 1320 05/18/19 1321 05/19/19 0349  NA 139   < > 139   < > 140   < > 137  --  140  --  140  --  141 139  K 3.8   < > 4.2   < > 3.7   < > 4.4   < > 4.6   < > 4.1   < > 3.9 4.4  CL 103   < > 107  --  103  --  100  --  103  --   --   --   --  102  CO2 26   < > 24  --  27  --  28  --  26  --   --   --   --  26  GLUCOSE 136*   < > 98  --  108*  --  84  --  98  --   --   --   --  92  BUN 15   < > 12  --  10  --  12  --  13  --   --   --   --  13  CREATININE 1.28*   < > 1.09  --  1.19  --  1.11  --  1.16  --    --   --   --  1.00  CALCIUM 8.4*   < > 8.7*  --  8.5*  --  8.8*  --  8.8*  --   --   --   --  9.1  MG 1.9  --  1.7  --  1.7  --  1.9  --   --   --   --   --   --  1.7  PHOS 4.4   < > 4.1  --  5.0*  --  4.7*  --  4.3  --   --   --   --  4.7*   < > = values in this interval not displayed.   GFR Estimated Creatinine Clearance: 63.1 mL/min (by C-G formula based on SCr of 1 mg/dL). Liver Function Tests: Recent Labs  Lab 05/13/19 0452 05/14/19 0442 05/15/19 0444 05/16/19 0456 05/17/19 0500  AST 111* 73* 59* 49* 43*  ALT 650* 479* 431* 318* 249*  ALKPHOS 76 75 77 74 71  BILITOT 2.6* 2.0* 1.5* 1.0 0.7  PROT 5.6* 5.6* 6.1* 6.1* 6.3*  ALBUMIN 2.7* 2.7* 3.0* 3.0* 3.1*   No results for input(s): LIPASE, AMYLASE in the last 168 hours. Recent Labs  Lab 05/15/19 0441 05/16/19 0456 05/17/19 0500 05/18/19 0327 05/19/19 0349  AMMONIA 21 19 18 15 29    Coagulation profile No results for input(s): INR, PROTIME in the last 168 hours. COVID-19 Labs  Recent Labs    05/17/19 0500 05/18/19 0327 05/19/19 0349  DDIMER 0.82* 0.81* 0.90*    Lab Results  Component Value Date   SARSCOV2NAA NEGATIVE 05/05/2019    CBC: Recent Labs  Lab 05/15/19 0444 05/15/19 0444 05/16/19 0456 05/16/19 0456 05/17/19 0800 05/18/19 0327 05/18/19 1320 05/18/19 1321 05/19/19 0349  WBC 7.1  --  6.6  --  8.2 7.2  --   --  6.9  HGB 12.3*   < > 11.3*   < > 11.3* 11.8* 12.6* 12.2* 12.5*  HCT 37.9*   < > 34.3*   < > 34.1* 35.0* 37.0* 36.0* 36.9*  MCV 92.7  --  92.5  --  91.2 91.4  --   --  90.4  PLT 27*  --  76*  --  99* 106*  --   --  126*   < > = values in this interval not displayed.   Cardiac Enzymes: No results for input(s): CKTOTAL, CKMB, CKMBINDEX, TROPONINI in the last 168 hours. BNP (last 3 results) No results for input(s): PROBNP in the last 8760 hours. CBG: No results for input(s): GLUCAP in the last 168 hours. D-Dimer: Recent Labs    05/18/19 0327 05/19/19 0349  DDIMER 0.81* 0.90*    Hgb A1c: No results for input(s): HGBA1C in the last 72 hours. Lipid Profile: No results for input(s): CHOL, HDL, LDLCALC, TRIG, CHOLHDL, LDLDIRECT in the last 72 hours. Thyroid function studies: No results for input(s): TSH, T4TOTAL, T3FREE, THYROIDAB in the last 72 hours.  Invalid input(s): FREET3 Anemia work up: No results for input(s): VITAMINB12, FOLATE, FERRITIN, TIBC, IRON, RETICCTPCT in the last 72 hours. Sepsis Labs: Recent Labs  Lab 05/16/19 0456 05/17/19 0800 05/18/19 0327 05/19/19 0349  WBC 6.6 8.2 7.2 6.9   Microbiology Recent Results (from the past 240 hour(s))  Culture, Urine     Status: Abnormal   Collection Time: 05/14/19  7:49 AM   Specimen: Urine, Random  Result Value Ref Range Status   Specimen Description URINE, RANDOM  Final   Special Requests   Final    NONE Performed at [facility], [address] [place], [place] [address]    Culture >=100,000 COLONIES/mL YEAST (A)  Final   Report Status 05/15/2019 FINAL  Final  Medications:   . amiodarone  200 mg Oral BID  . apixaban  5 mg Oral BID  . digoxin  0.125 mg Oral Daily  . feeding supplement (ENSURE ENLIVE)  237 mL Oral 4x daily  . fluconazole  200 mg Oral Daily  . folic acid  1 mg Intravenous Daily  . haloperidol lactate  1 mg Intravenous TID  . lactulose  20 g Oral BID   Or  . lactulose  300 mL Rectal BID  . LORazepam  0-4 mg Intravenous Q8H  . multivitamin with minerals  1 tablet Oral Daily  . nicotine  21 mg Transdermal Daily  . sacubitril-valsartan  1 tablet Oral BID  . sodium chloride flush  3 mL Intravenous Q12H  . spironolactone  25 mg Oral Daily  . thiamine  100 mg Oral Daily   Or  . thiamine  100 mg Intravenous Daily   Continuous Infusions:    LOS: 15 days   Charlynne Cousins  [facility]  05/19/2019, 9:16 AM

## 2019-05-19 NOTE — Progress Notes (Signed)
Physical Therapy Treatment Patient Details Name: Austin Cobb MRN: [id number] DOB: 07/10/1960 Today's Date: 05/19/2019    History of Present Illness Patient is a 59 year old African-American male with past medical history significant for tobacco use disorder, substance abuse (crack and marijuana), bipolar disorder and lifelong alcoholism.  Patient's last drink was 2 days ago.  Patient presents with 1 day history of worsening shortness of breath.    PT Comments    Pt did well with mobility this pm, was slightly agitated and mumbling throughout but was able to follow cues to complete tx. Needed slightly increased assist with mobility for safety used walker for ambulation up to 174ft needed mod a and walker propagation assist, cues for safe foot placement and also with turns.      Follow Up Recommendations  SNF;Supervision/Assistance - 24 hour     Equipment Recommendations  None recommended by PT    Recommendations for Other Services       Precautions / Restrictions Precautions Precautions: Fall Precaution Comments: pt bipolar and becomes agitated easily and is very particular Restrictions Weight Bearing Restrictions: No    Mobility  Bed Mobility Overal bed mobility: Needs Assistance Bed Mobility: Supine to Sit;Sit to Supine     Supine to sit: Supervision Sit to supine: Supervision   General bed mobility comments: needed set up and also line management  Transfers Overall transfer level: Needs assistance Equipment used: Rolling walker (2 wheeled) Transfers: Sit to/from [facility] Sit to Stand: Min assist;Min guard Stand pivot transfers: Min assist;Min guard       General transfer comment: needs a with walker safety and line management  Ambulation/Gait Ambulation/Gait assistance: Mod assist Gait Distance (Feet): 114 Feet Assistive device: Rolling walker (2 wheeled) Gait Pattern/deviations: Step-to pattern;Shuffle;Wide base of support Gait  velocity: dec   General Gait Details: needed cues for walker use and safety at times assist to propagate walker and not get too far behind it   Stairs             Wheelchair Mobility    Modified Rankin (Stroke Patients Only)       Balance Overall balance assessment: Needs assistance Sitting-balance support: Feet supported;No upper extremity supported Sitting balance-Leahy Scale: Good     Standing balance support: During functional activity;Bilateral upper extremity supported Standing balance-Leahy Scale: Poor Standing balance comment: dependent on UE support                            Cognition Arousal/Alertness: Awake/alert Behavior During Therapy: Impulsive;Agitated;Anxious;Restless Overall Cognitive Status: History of cognitive impairments - at baseline Area of Impairment: Following commands;Attention;Safety/judgement;Awareness;Problem solving                   Current Attention Level: Focused   Following Commands: Follows one step commands with increased time;Follows one step commands inconsistently Safety/Judgement: Decreased awareness of safety;Decreased awareness of deficits Awareness: Intellectual Problem Solving: Requires verbal cues;Requires tactile cues General Comments: pt found attempting to get oob prior to tx agreeable to some mobility      Exercises      General Comments        Pertinent Vitals/Pain Pain Assessment: Faces Faces Pain Scale: Hurts little more Pain Location: w/ some mobility Pain Descriptors / Indicators: Grimacing;Guarding Pain Intervention(s): Limited activity within patient's tolerance;Monitored during session    Home Living  Prior Function            PT Goals (current goals can now be found in the care plan section) Acute Rehab PT Goals Patient Stated Goal: pt mumbling throughout session, states he has been kept here and not fed and multi other complains nurse had  aleady addressed. PT Goal Formulation: With patient Time For Goal Achievement: 05/31/19 Potential to Achieve Goals: Good Progress towards PT goals: Progressing toward goals    Frequency    Min 3X/week      PT Plan Current plan remains appropriate    Co-evaluation              AM-PAC PT "6 Clicks" Mobility   Outcome Measure  Help needed turning from your back to your side while in a flat bed without using bedrails?: None Help needed moving from lying on your back to sitting on the side of a flat bed without using bedrails?: None Help needed moving to and from a bed to a chair (including a wheelchair)?: A Little Help needed standing up from a chair using your arms (e.g., wheelchair or bedside chair)?: A Little Help needed to walk in hospital room?: A Lot Help needed climbing 3-5 steps with a railing? : A Lot 6 Click Score: 18    End of Session Equipment Utilized During Treatment: Gait belt Activity Tolerance: Patient limited by fatigue;Patient limited by lethargy Patient left: in bed;with call bell/phone within reach;with bed alarm set Nurse Communication: Mobility status PT Visit Diagnosis: Unsteadiness on feet (R26.81);Other abnormalities of gait and mobility (R26.89);Repeated falls (R29.6);Muscle weakness (generalized) (M62.81);History of falling (Z91.81);Difficulty in walking, not elsewhere classified (R26.2)     Time: [id number] PT Time Calculation (min) (ACUTE ONLY): 23 min  Charges:  $Gait Training: 8-22 mins $Therapeutic Activity: 8-22 mins                     Austin Cobb, PT    Austin Cobb 05/19/2019, 4:23 PM

## 2019-05-19 NOTE — Progress Notes (Signed)
Patient ID: Austin Cobb, male   DOB: 06-Feb-1961, 59 y.o.   MRN: [id number]     Advanced Heart Failure Rounding Note  PCP-Cardiologist: Donato Heinz, MD   Subjective:    Fairly clear this morning but still with intermittent agitation. No dyspnea.    LHC/RHC:  Coronary Findings  Diagnostic Dominance: Right Left Main  Short, no significant disease.  Left Anterior Descending  No significant coronary disease.  Left Circumflex  No significant coronary disease.  Right Coronary Artery  40% proximal RCA stenosis.  Intervention  No interventions have been documented. Right Heart  Right Heart Pressures RHC Procedural Findings: Hemodynamics (mmHg) RA mean 3 RV 22/1 PA 22/7, mean 12 PCWP mean 6 LV 126/9 AO 138/75  Oxygen saturations: PA 74% AO 100%  Cardiac Output (Fick) 5.48  Cardiac Index (Fick) 3.19     Objective:   Weight Range: 56.1 kg Body mass index is 18.27 kg/m.   Vital Signs:   Temp:  [97.6 F (36.4 C)] 97.6 F (36.4 C) (03/10 0436) Pulse Rate:  [60-120] 101 (03/10 0840) Resp:  [6-37] 16 (03/10 0436) BP: (94-187)/(43-137) 127/58 (03/10 0640) SpO2:  [94 %-100 %] 100 % (03/10 0436) Last BM Date: 05/17/19  Weight change: Filed Weights   05/16/19 0434 05/17/19 0652 05/18/19 0609  Weight: 58.4 kg 59.4 kg 56.1 kg    Intake/Output:   Intake/Output Summary (Last 24 hours) at 05/19/2019 0906 Last data filed at 05/19/2019 0655 Gross per 24 hour  Intake 129 ml  Output 700 ml  Net -571 ml      Physical Exam   General: NAD Neck: No JVD, no thyromegaly or thyroid nodule.  Lungs: Clear to auscultation bilaterally with normal respiratory effort. CV: Nondisplaced PMI.  Heart mildly tachy, regular S1/S2, no S3/S4, no murmur.  No peripheral edema.   Abdomen: Soft, nontender, no hepatosplenomegaly, no distention.  Skin: Intact without lesions or rashes.  Neurologic: Alert and oriented to person/place.  Psych: Agitated  intermittently Extremities: No clubbing or cyanosis.  HEENT: Normal.    Telemetry   A flutter 100-110s, personally reviewed.   Labs    CBC Recent Labs    05/18/19 0327 05/18/19 1320 05/18/19 1321 05/19/19 0349  WBC 7.2  --   --  6.9  HGB 11.8*   < > 12.2* 12.5*  HCT 35.0*   < > 36.0* 36.9*  MCV 91.4  --   --  90.4  PLT 106*  --   --  126*   < > = values in this interval not displayed.   Basic Metabolic Panel Recent Labs    05/17/19 0500 05/17/19 0500 05/18/19 0327 05/18/19 1320 05/18/19 1321 05/19/19 0349  NA 137   < > 140   < > 141 139  K 4.4   < > 4.6   < > 3.9 4.4  CL 100   < > 103  --   --  102  CO2 28   < > 26  --   --  26  GLUCOSE 84   < > 98  --   --  92  BUN 12   < > 13  --   --  13  CREATININE 1.11   < > 1.16  --   --  1.00  CALCIUM 8.8*   < > 8.8*  --   --  9.1  MG 1.9  --   --   --   --  1.7  PHOS 4.7*   < >  4.3  --   --  4.7*   < > = values in this interval not displayed.   Liver Function Tests Recent Labs    05/17/19 0500  AST 43*  ALT 249*  ALKPHOS 71  BILITOT 0.7  PROT 6.3*  ALBUMIN 3.1*   No results for input(s): LIPASE, AMYLASE in the last 72 hours. Cardiac Enzymes No results for input(s): CKTOTAL, CKMB, CKMBINDEX, TROPONINI in the last 72 hours.  BNP: BNP (last 3 results) Recent Labs    05/04/19 1540  BNP 2,060.2*    ProBNP (last 3 results) No results for input(s): PROBNP in the last 8760 hours.   D-Dimer Recent Labs    05/18/19 0327 05/19/19 0349  DDIMER 0.81* 0.90*   Hemoglobin A1C No results for input(s): HGBA1C in the last 72 hours. Fasting Lipid Panel No results for input(s): CHOL, HDL, LDLCALC, TRIG, CHOLHDL, LDLDIRECT in the last 72 hours. Thyroid Function Tests No results for input(s): TSH, T4TOTAL, T3FREE, THYROIDAB in the last 72 hours.  Invalid input(s): FREET3  Other results:   Imaging    CARDIAC CATHETERIZATION  Result Date: 05/18/2019 1. Low filling pressures. 2. Preserved cardiac output.  3. Nonobstructive mild coronary disease. Nonischemic cardiomyopathy.  Will start Eliquis this evening.     Medications:     Scheduled Medications:  amiodarone  200 mg Oral BID   apixaban  5 mg Oral BID   digoxin  0.125 mg Oral Daily   feeding supplement (ENSURE ENLIVE)  237 mL Oral 4x daily   fluconazole  200 mg Oral Daily   folic acid  1 mg Intravenous Daily   haloperidol lactate  1 mg Intravenous TID   lactulose  20 g Oral BID   Or   lactulose  300 mL Rectal BID   LORazepam  0-4 mg Intravenous Q8H   multivitamin with minerals  1 tablet Oral Daily   nicotine  21 mg Transdermal Daily   sacubitril-valsartan  1 tablet Oral BID   sodium chloride flush  3 mL Intravenous Q12H   spironolactone  25 mg Oral Daily   thiamine  100 mg Oral Daily   Or   thiamine  100 mg Intravenous Daily    Infusions:   PRN Medications: acetaminophen, hydrALAZINE, LORazepam **OR** LORazepam, metoprolol tartrate, ondansetron (ZOFRAN) IV, sodium chloride flush   Assessment/Plan   1. Altered mental status/delirium: Suspect ETOH withdrawal with significant rise in BP and history of heavy ETOH.  Also possible hepatic encephalopathy with elevated NH3.  This seems to have resolved, NH3 normalized.  He remains intermittently confused and agitated, ?baseline component of dementia related to ETOH abuse.  2.  AKI: Suspect cardiorenal, possibly with low output. Contrast nephropathy from PE CT may also play a role.  This has resolved.  3. Acute systolic CHF: Nonischemic cardiomyopathy.  Echo this admission with EF 15%, severe RV dysfunction.  No prior cardiac history.  Etiology is uncertain => could be tachycardia-mediated CMP but exertional dyspnea pre-dated palpitations, could be due to long-standing heavy ETOH use, could be viral cardiomyopathy.  Cath 3/9 showed no significant CAD. He had DCCV but recurrence of atrial flutter on 2/27  and remains in flutter today, rates in the 100s now. Initially  on milrinone, now off.  Good cardiac output on RHC 3/9, filling pressures low. Volume status looks ok on exam today.   - Continue spironolactone 25 mg daily.   - Continue Entresto 49/51 bid.     - Continue digoxin 0.125 daily, check level  in am.  4. Atrial flutter: AFL with RVR at admission, uncertain how long.  He had felt palpitations for about a week prior to admission.  Possible component of tachy-mediated CMP.  He is s/p TEE-guided DCCV to NSR but atrial flutter recurred on 2/27.  Still in flutter in 100s.  - Continue amiodarone 200 mg twice a day.  - Repeat TEE-DCCV tomorrow (would repeat TEE as he has not been continuously anticoagulated with cath and transition to apixaban yesterday).  - Consider ablation down the road, especially if he can stop drinking.  - Continue apixaban.   5. ETOH abuse: Delirium tremens this admission.  Improved but still confused, ?some degree of baseline dementia.  Imperative to quit ETOH but not sure how successful he will be with this if he goes home after this admission.   6. Thrombocytopenia: ?HIT with fall in platelets on heparin gtt, transitioned to bivalirudin and now on Eliquis. HIT Ab+ initially with fall in plts on heparin gtt.  Plts recovered on bivalirudin, but HIT SRA negative.  Clinically, still looks like HIT.  Platelets trending up.  - Stopped Librium as there is some risk for thrombocytopenia from this.  - No other med is an obvious culprit.  Length of Stay: [place], MD  05/19/2019, 9:06 AM  Advanced Heart Failure Team Pager [phone] (M-F; 7a - 4p)  Please contact [facility] for night-coverage after hours (4p -7a ) and weekends on [email or web address]

## 2019-05-19 NOTE — NC FL2 (Signed)
[place] LEVEL OF CARE SCREENING TOOL     IDENTIFICATION  Patient Name: Austin Cobb Birthdate: [birth year] Sex: male Admission Date (Current Location): 05/04/2019  [facility] and [place] Number:  Herbalist and Address:  [facility], [place] [address], [place], [place] [address]      Provider Number: [id number]  Attending Physician Name and Address:  Charlynne Cousins, MD  Relative Name and Phone Number:       Current Level of Care: Hospital Recommended Level of Care: [place] Prior Approval Number:    Date Approved/Denied:   PASRR Number: pending, under review  Discharge Plan: SNF    Current Diagnoses: Patient Active Problem List   Diagnosis Date Noted  . Yeast UTI 05/15/2019  . Alcohol withdrawal ([place]) 05/14/2019  . Acute metabolic encephalopathy [id number]  . AKI (acute kidney injury) ([place]) 05/14/2019  . Elevated d-dimer 05/14/2019  . Demand ischemia ([place]) 05/14/2019  . Elevated troponin 05/14/2019  . Pleural effusion 05/14/2019  . Polysubstance abuse ([place]) 05/14/2019  . E. coli UTI 05/14/2019  . Agitation 05/14/2019  . Noncompliance 05/14/2019  . Acute on chronic combined systolic and diastolic CHF (congestive heart failure) ([place])   . Atrial flutter with rapid ventricular response ([place]) 05/04/2019  . Acute heart failure ([place])   . Essential hypertension   . Herpes zoster without complication 0000000  . History of fall 07/23/2017  . Prostatitis 06/23/2014  . Serrated adenoma of colon 05/13/2012  . Anal fissure 05/13/2012  . Tobacco abuse 05/13/2012  . 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 Chronic shoulder pain 01/03/2012  . Neck pain, chronic 01/03/2012  . Mood disorder ([place]) 01/03/2012  . Depression with anxiety 01/03/2012    Orientation RESPIRATION BLADDER Height & Weight     Self, Time, Situation, Place  Normal Incontinent Weight: 123 lb 11.2 oz (56.1 kg) Height:  5\' 9"   (175.3 cm)  BEHAVIORAL SYMPTOMS/MOOD NEUROLOGICAL BOWEL NUTRITION STATUS      Incontinent Diet(See discharge summery)  AMBULATORY STATUS COMMUNICATION OF NEEDS Skin   Extensive Assist Verbally Normal                       Personal Care Assistance Level of Assistance  Dressing, Bathing, Feeding Bathing Assistance: Maximum assistance Feeding assistance: Independent Dressing Assistance: Maximum assistance     Functional Limitations Info  Sight Sight Info: Impaired        SPECIAL CARE FACTORS FREQUENCY  PT (By licensed PT), OT (By licensed OT)     PT Frequency: 5x a week OT Frequency: 5x a week            Contractures      Additional Factors Info  Code Status, Allergies, Psychotropic Code Status Info: Full Allergies Info: Heparin Psychotropic Info: haldol 1mg  3x a day, ativan 0-4mg  every 8 hours         Current Medications (05/19/2019):  This is the current hospital active medication list Current Facility-Administered Medications  Medication Dose Route Frequency Provider Last Rate Last Admin  . 0.9 %  sodium chloride infusion   Intravenous Continuous Larey Dresser, MD      . acetaminophen (TYLENOL) tablet 650 mg  650 mg Oral Q4H PRN Larey Dresser, MD      . amiodarone (PACERONE) tablet 200 mg  200 mg Oral BID Allie Bossier, MD   200 mg at 05/19/19 [id number]  . apixaban (ELIQUIS) tablet 5 mg  5  mg Oral BID Larey Dresser, MD   5 mg at 05/19/19 [id number]  . digoxin (LANOXIN) tablet 0.125 mg  0.125 mg Oral Daily Larey Dresser, MD   0.125 mg at 05/19/19 0841  . feeding supplement (ENSURE ENLIVE) (ENSURE ENLIVE) liquid 237 mL  237 mL Oral 4x daily Allie Bossier, MD   237 mL at 05/19/19 1229  . fluconazole (DIFLUCAN) tablet 200 mg  200 mg Oral Daily Allie Bossier, MD   200 mg at 05/19/19 0841  . folic acid injection 1 mg  1 mg Intravenous Daily Allie Bossier, MD   1 mg at 05/19/19 1010  . haloperidol lactate (HALDOL) injection 1 mg  1 mg Intravenous TID Allie Bossier, MD   1 mg at 05/19/19 1010  . hydrALAZINE (APRESOLINE) injection 10 mg  10 mg Intravenous Q6H PRN Allie Bossier, MD   10 mg at 05/18/19 2031  . lactulose (CHRONULAC) 10 GM/15ML solution 20 g  20 g Oral BID Wendee Beavers T, MD   20 g at 05/19/19 0840   Or  . lactulose (CHRONULAC) enema 200 gm  300 mL Rectal BID Wendee Beavers T, MD      . LORazepam (ATIVAN) injection 0-4 mg  0-4 mg Intravenous Q8H Allie Bossier, MD      . LORazepam (ATIVAN) tablet 1-4 mg  1-4 mg Oral Q1H PRN Allie Bossier, MD       Or  . LORazepam (ATIVAN) injection 1-4 mg  1-4 mg Intravenous Q1H PRN Allie Bossier, MD   2 mg at 05/19/19 1412  . metoprolol tartrate (LOPRESSOR) injection 2.5 mg  2.5 mg Intravenous Q2H PRN Allie Bossier, MD      . multivitamin with minerals tablet 1 tablet  1 tablet Oral Daily Allie Bossier, MD   1 tablet at 05/19/19 [phone]  . nicotine (NICODERM CQ - dosed in mg/24 hours) patch 21 mg  21 mg Transdermal Daily Kroeger, Krista M., PA-C   21 mg at 05/19/19 0845  . ondansetron (ZOFRAN) injection 4 mg  4 mg Intravenous Q6H PRN Larey Dresser, MD      . sacubitril-valsartan (ENTRESTO) 49-51 mg per tablet  1 tablet Oral BID Larey Dresser, MD   1 tablet at 05/19/19 [phone]  . sodium chloride flush (NS) 0.9 % injection 3 mL  3 mL Intravenous Q12H Larey Dresser, MD   3 mL at 05/19/19 0845  . sodium chloride flush (NS) 0.9 % injection 3 mL  3 mL Intravenous PRN Larey Dresser, MD      . spironolactone (ALDACTONE) tablet 25 mg  25 mg Oral Daily Larey Dresser, MD   25 mg at 05/19/19 [id number]  . thiamine tablet 100 mg  100 mg Oral Daily Nahser, Wonda Cheng, MD   100 mg at 05/19/19 [id number]   Or  . thiamine (B-1) injection 100 mg  100 mg Intravenous Daily Nahser, Wonda Cheng, MD   100 mg at 05/16/19 1001     Discharge Medications: Please see discharge summary for a list of discharge medications.  Relevant Imaging Results:  Relevant Lab Results:   Additional Information SSN [id number]  Emeterio Reeve, [place]

## 2019-05-19 NOTE — Plan of Care (Signed)
  Problem: Nutrition: Goal: Adequate nutrition will be maintained Outcome: Progressing   Problem: Safety: Goal: Ability to remain free from injury will improve Outcome: Progressing   Problem: Education: Goal: Knowledge of General Education information will improve Description: Including pain rating scale, medication(s)/side effects and non-pharmacologic comfort measures Outcome: Not Progressing Pt continues to be confused relating to place, time, and situation

## 2019-05-20 ENCOUNTER — Inpatient Hospital Stay (HOSPITAL_COMMUNITY): Payer: Medicare PPO | Admitting: Anesthesiology

## 2019-05-20 ENCOUNTER — Encounter (HOSPITAL_COMMUNITY): Payer: Self-pay | Admitting: Internal Medicine

## 2019-05-20 ENCOUNTER — Encounter (HOSPITAL_COMMUNITY)
Admission: EM | Disposition: A | Discharge status: Skilled Nursing Facility | Payer: Self-pay | Source: Home / Self Care | Attending: Internal Medicine

## 2019-05-20 ENCOUNTER — Ambulatory Visit (HOSPITAL_COMMUNITY): Admission: RE | Admit: 2019-05-20 | Payer: Medicare PPO | Source: Home / Self Care | Admitting: Cardiology

## 2019-05-20 ENCOUNTER — Inpatient Hospital Stay (HOSPITAL_COMMUNITY): Payer: Medicare PPO

## 2019-05-20 DIAGNOSIS — E43 Unspecified severe protein-calorie malnutrition: Secondary | ICD-10-CM | POA: Insufficient documentation

## 2019-05-20 DIAGNOSIS — I4891 Unspecified atrial fibrillation: Secondary | ICD-10-CM

## 2019-05-20 HISTORY — PX: CARDIOVERSION: SHX1299

## 2019-05-20 HISTORY — PX: TEE WITHOUT CARDIOVERSION: SHX5443

## 2019-05-20 LAB — CBC
HCT: 39.7 % (ref 39.0–52.0)
Hemoglobin: 12.9 g/dL — ABNORMAL LOW (ref 13.0–17.0)
MCH: 30.3 pg (ref 26.0–34.0)
MCHC: 32.5 g/dL (ref 30.0–36.0)
MCV: 93.2 fL (ref 80.0–100.0)
Platelets: 143 10*3/uL — ABNORMAL LOW (ref 150–400)
RBC: 4.26 MIL/uL (ref 4.22–5.81)
RDW: 14.6 % (ref 11.5–15.5)
WBC: 7.1 10*3/uL (ref 4.0–10.5)
nRBC: 0 % (ref 0.0–0.2)

## 2019-05-20 LAB — BASIC METABOLIC PANEL
Anion gap: 10 (ref 5–15)
BUN: 15 mg/dL (ref 6–20)
CO2: 29 mmol/L (ref 22–32)
Calcium: 9.4 mg/dL (ref 8.9–10.3)
Chloride: 100 mmol/L (ref 98–111)
Creatinine, Ser: 1.28 mg/dL — ABNORMAL HIGH (ref 0.61–1.24)
GFR calc Af Amer: >60 mL/min (ref >60)
GFR calc non Af Amer: >60 mL/min (ref >60)
Glucose, Bld: 97 mg/dL (ref 70–99)
Potassium: 4.8 mmol/L (ref 3.5–5.1)
Sodium: 139 mmol/L (ref 135–145)

## 2019-05-20 LAB — PHOSPHORUS: Phosphorus: 4.9 mg/dL — ABNORMAL HIGH (ref 2.5–4.6)

## 2019-05-20 LAB — AMMONIA: Ammonia: 13 umol/L (ref 9–35)

## 2019-05-20 LAB — D-DIMER, QUANTITATIVE: D-Dimer, Quant: 1.1 ug/mL-FEU — ABNORMAL HIGH (ref 0.00–0.50)

## 2019-05-20 LAB — MAGNESIUM: Magnesium: 1.8 mg/dL (ref 1.7–2.4)

## 2019-05-20 LAB — DIGOXIN LEVEL: Digoxin Level: 0.8 ng/mL (ref 0.8–2.0)

## 2019-05-20 SURGERY — ECHOCARDIOGRAM, TRANSESOPHAGEAL
Anesthesia: Monitor Anesthesia Care

## 2019-05-20 MED ORDER — PROPOFOL 500 MG/50ML IV EMUL
INTRAVENOUS | Status: DC | PRN
  Administered 2019-05-20: 100 ug/kg/min via INTRAVENOUS

## 2019-05-20 MED ORDER — HALOPERIDOL LACTATE 5 MG/ML IJ SOLN
1.0000 mg | Freq: Four times a day (QID) | INTRAMUSCULAR | Status: DC | PRN
  Administered 2019-05-21: 1 mg via INTRAVENOUS

## 2019-05-20 MED ORDER — LIDOCAINE VISCOUS HCL 2 % MT SOLN
OROMUCOSAL | Status: DC | PRN
  Administered 2019-05-20: 1 via OROMUCOSAL

## 2019-05-20 MED ORDER — PROPOFOL 10 MG/ML IV BOLUS: INTRAVENOUS | Status: DC | PRN

## 2019-05-20 MED ORDER — ISOSORB DINITRATE-HYDRALAZINE 20-37.5 MG PO TABS
1.0000 | ORAL_TABLET | Freq: Three times a day (TID) | ORAL | Status: DC
  Administered 2019-05-20 – 2019-05-21 (×5): 1 via ORAL
  Filled 2019-05-20 (×7): qty 1

## 2019-05-20 MED ORDER — PROPOFOL 10 MG/ML IV BOLUS
INTRAVENOUS | Status: DC | PRN
  Administered 2019-05-20 (×2): 25 mg via INTRAVENOUS

## 2019-05-20 NOTE — Progress Notes (Signed)
  Echocardiogram Echocardiogram Transesophageal has been performed.  Austin Cobb 05/20/2019, 3:00 PM

## 2019-05-20 NOTE — Progress Notes (Signed)
Patient ID: Austin Cobb, male   DOB: 09/17/1960, 59 y.o.   MRN: [id number]     Advanced Heart Failure Rounding Note  PCP-Cardiologist: Donato Heinz, MD   Subjective:    Very weak getting up to chair.  He remains in atrial flutter rate 110s.  Denies dyspnea.     LHC/RHC:  Coronary Findings  Diagnostic Dominance: Right Left Main  Short, no significant disease.  Left Anterior Descending  No significant coronary disease.  Left Circumflex  No significant coronary disease.  Right Coronary Artery  40% proximal RCA stenosis.  Intervention  No interventions have been documented. Right Heart  Right Heart Pressures RHC Procedural Findings: Hemodynamics (mmHg) RA mean 3 RV 22/1 PA 22/7, mean 12 PCWP mean 6 LV 126/9 AO 138/75  Oxygen saturations: PA 74% AO 100%  Cardiac Output (Fick) 5.48  Cardiac Index (Fick) 3.19     Objective:   Weight Range: 56.1 kg Body mass index is 18.27 kg/m.   Vital Signs:   Temp:  [97.6 F (36.4 C)-98 F (36.7 C)] 97.6 F (36.4 C) (03/11 0500) Pulse Rate:  [101-117] 106 (03/11 0500) Resp:  [16] 16 (03/11 0500) BP: (116-148)/(88-102) 148/102 (03/11 0500) SpO2:  [97 %-98 %] 98 % (03/11 0500) Last BM Date: 05/19/19  Weight change: Filed Weights   05/16/19 0434 05/17/19 0652 05/18/19 0609  Weight: 58.4 kg 59.4 kg 56.1 kg    Intake/Output:   Intake/Output Summary (Last 24 hours) at 05/20/2019 0750 Last data filed at 05/19/2019 2229 Gross per 24 hour  Intake 353 ml  Output 525 ml  Net -172 ml      Physical Exam   General: NAD Neck: No JVD, no thyromegaly or thyroid nodule.  Lungs: Clear to auscultation bilaterally with normal respiratory effort. CV: Nondisplaced PMI.  Heart mildly tachy, regular S1/S2, no S3/S4, no murmur.  No peripheral edema.  No carotid bruit.  Normal pedal pulses.  Abdomen: Soft, nontender, no hepatosplenomegaly, no distention.  Skin: Intact without lesions or rashes.  Neurologic: Alert  and oriented x 3.  Psych: Normal affect. Extremities: No clubbing or cyanosis.  HEENT: Normal.    Telemetry   A flutter 110s, personally reviewed.   Labs    CBC Recent Labs    05/19/19 1549 05/20/19 0402  WBC 6.5 7.1  NEUTROABS 4.1  --   HGB 12.7* 12.9*  HCT 37.5* 39.7  MCV 91.2 93.2  PLT 130* [id number]*   Basic Metabolic Panel Recent Labs    05/19/19 0349 05/20/19 0402  NA 139 139  K 4.4 4.8  CL 102 100  CO2 26 29  GLUCOSE 92 97  BUN 13 15  CREATININE 1.00 1.28*  CALCIUM 9.1 9.4  MG 1.7 1.8  PHOS 4.7* 4.9*   Liver Function Tests No results for input(s): AST, ALT, ALKPHOS, BILITOT, PROT, ALBUMIN in the last 72 hours. No results for input(s): LIPASE, AMYLASE in the last 72 hours. Cardiac Enzymes No results for input(s): CKTOTAL, CKMB, CKMBINDEX, TROPONINI in the last 72 hours.  BNP: BNP (last 3 results) Recent Labs    05/04/19 1540  BNP 2,060.2*    ProBNP (last 3 results) No results for input(s): PROBNP in the last 8760 hours.   D-Dimer Recent Labs    05/19/19 0349 05/20/19 0402  DDIMER 0.90* 1.10*   Hemoglobin A1C No results for input(s): HGBA1C in the last 72 hours. Fasting Lipid Panel No results for input(s): CHOL, HDL, LDLCALC, TRIG, CHOLHDL, LDLDIRECT in the last 72 hours.  Thyroid Function Tests No results for input(s): TSH, T4TOTAL, T3FREE, THYROIDAB in the last 72 hours.  Invalid input(s): FREET3  Other results:   Imaging    No results found.   Medications:     Scheduled Medications: . amiodarone  200 mg Oral BID  . apixaban  5 mg Oral BID  . digoxin  0.125 mg Oral Daily  . feeding supplement (ENSURE ENLIVE)  237 mL Oral 4x daily  . fluconazole  200 mg Oral Daily  . folic acid  1 mg Intravenous Daily  . haloperidol lactate  1 mg Intravenous TID  . lactulose  20 g Oral BID   Or  . lactulose  300 mL Rectal BID  . LORazepam  0-4 mg Intravenous Q8H  . multivitamin with minerals  1 tablet Oral Daily  . nicotine  21 mg  Transdermal Daily  . sacubitril-valsartan  1 tablet Oral BID  . sodium chloride flush  3 mL Intravenous Q12H  . spironolactone  25 mg Oral Daily  . thiamine  100 mg Oral Daily   Or  . thiamine  100 mg Intravenous Daily    Infusions: . sodium chloride      PRN Medications: acetaminophen, hydrALAZINE, LORazepam **OR** LORazepam, metoprolol tartrate, ondansetron (ZOFRAN) IV, sodium chloride flush   Assessment/Plan   1. Altered mental status/delirium: Suspect ETOH withdrawal with significant rise in BP and history of heavy ETOH.  Also possible hepatic encephalopathy with elevated NH3.  This seems to have resolved, NH3 normalized.  He remains intermittently confused and agitated, ?baseline component of dementia related to ETOH abuse.  2.  AKI: Suspect cardiorenal, possibly with low output. Contrast nephropathy from PE CT may also play a role.  This has resolved.  3. Acute systolic CHF: Nonischemic cardiomyopathy.  Echo this admission with EF 15%, severe RV dysfunction.  No prior cardiac history.  Etiology is uncertain => could be tachycardia-mediated CMP but exertional dyspnea pre-dated palpitations, could be due to long-standing heavy ETOH use, could be viral cardiomyopathy.  Cath 3/9 showed no significant CAD. He had DCCV but recurrence of atrial flutter on 2/27  and remains in flutter today, rates in the 100s now. Initially on milrinone, now off.  Good cardiac output on RHC 3/9, filling pressures low. Volume status looks ok on exam today.   - Continue spironolactone 25 mg daily.   - Continue Entresto 49/51 bid.     - Continue digoxin 0.125 daily, level 0.8 today.  4. Atrial flutter: AFL with RVR at admission, uncertain how long.  He had felt palpitations for about a week prior to admission. Possible component of tachy-mediated CMP.  He is s/p TEE-guided DCCV to NSR but atrial flutter recurred on 2/27 while on milrinone.  Still in flutter in 100s.  - Continue amiodarone 200 mg twice a day.  -  Repeat TEE-DCCV today (would repeat TEE as he has not been continuously anticoagulated with cath and transition to apixaban yesterday). We discussed risks/benefits and he agrees to procedure.  - Consider ablation down the road, especially if he can stop drinking.  - Continue apixaban.   5. ETOH abuse: Delirium tremens this admission.  Improved but still confused, ?some degree of baseline dementia.  Imperative to quit ETOH but not sure how successful he will be with this if he goes home after this admission.   6. Thrombocytopenia: ?HIT with fall in platelets on heparin gtt, transitioned to bivalirudin and now on Eliquis. HIT Ab+ initially with fall in plts on heparin  gtt.  Plts recovered on bivalirudin, but HIT SRA negative.  Clinically, still looks like HIT.  Platelets trending up.  - Stopped Librium as there is some risk for thrombocytopenia from this.  - No other med is an obvious culprit. 7. Deconditioning: He will need SNF.   Length of Stay: [place], MD  05/20/2019, 7:50 AM  Advanced Heart Failure Team Pager [phone] (M-F; 7a - 4p)  Please contact [facility] for night-coverage after hours (4p -7a ) and weekends on [email or web address]

## 2019-05-20 NOTE — Progress Notes (Signed)
[facility] PROGRESS NOTE    Progress Note  Austin Cobb  [id number] DOB: 1960/11/14 DOA: 05/04/2019 PCP: Patient, No Pcp Per     Brief Narrative:   Austin Cobb is an 59 y.o. male past medical history of anxiety disorder depression alcohol abuse and polysubstance abuse comes in with 1 progressive dyspnea on exertion and fatigue over 2 months.  In the ED she was found to be in atrial flutter with RVR, UDS was negative mildly elevated AST and ALT, BNP greater than 2000 EKG revealed a flutter with LVH, with elevated lactic acid COVID-19 test was negative cardiology was consulted for a flutter with RVR and acute heart failure. CT angio of the chest was negative for PE, 2D echo showed an ejection fraction of 10% with global hypokinesia,  cardioversion on 05/06/2019. Advanced heart failure team was consulted and she was started on IV Lasix.  Assessment/Plan:   Acute systolic heart failure/acute kidney injury/cardiorenal syndrome:: 2D echo on admission showed an EF of 15% with severe right ventricular dysfunction.  Etiology has remained unclear.  But could be tachycardia mediated and longstanding heavy use of alcohol as well as viral cardiomyopathy. Events or failure team was consulted she was started on amiodarone drip. Cardiac cath on 05/18/2019 showed no CAD. He had DCCV but with recurrent atrial flutter again on 05/08/2019, till date he remains in flutter in the low 100s. Right heart cath on 05/18/2019 showed good cardiac output. He was initially on a milrinone drip (which has been held) Continue Aldactone, Entresto and digoxin as seen as needed.  A flutter with RVR: With a CHADS2-VASc > 2. Initially started on amiodarone drip which has now been transitioned to oral. Due to the concern of HIT she was started on bivalirudin. Cardiology will attempt TEE and DCCV tomorrow (they will repeat a TEE as he has not been continuously on anticoagulation, he is currently on  apixaban) Continue apixaban.  Alcohol withdrawal/acute metabolic encephalopathy/delirium tremens: Drug screen was negative, she was monitored with CIWA protocol. She was started on Librium for encephalopathy, she attempted to leave AMA on 05/14/2019 as she became agitated. This morning her mentation is clearing.  Question of some degree of baseline dementia. Continue to use Haldol IV as needed.  Acute kidney injury: With a baseline creatinine of 1.1: Suspect low cardiac output, he also had a CT angio on admission which may be playing a role this has resolved.  Thrombocytopenia: Question due to HIT he has been taken off heparin and transition to bivalirudin now on Eliquis.  Platelets are recovering.  Elevated troponins: Likely due to demand ischemia cardiac cath showed no obstructive disease.  Elevated LFTs: Likely due to alcohol abuse.  Liver enzymes slowly improving.  Bilateral pleural effusions: Noted.  Polysubstance abuse: Urine drug screen negative, cultures have remained negative he received 2 weeks of fluconazole as on 05/14/2019 his urine culture was positive for yeast.  Sick euthyroid syndrome: Thyroid function test will need to be repeated 6 weeks an outpatient.  Electrolyte imbalance: Hypomagnesemia and hypokalemia: Try to keep magnesium greater than 2 potassium greater than 4.  New hematuria: Currently on Eliquis, no history of trauma we will have to call urology for further evaluation once cardioversion has been completed.   DVT prophylaxis: [place] Communication:none Disposition Plan/Barrier to D/C:  Once he remains in sinus rhythm and his hematuria work-up has been completed.  Once he is back and remains in sinus rhythm and his hematuria work-up is being completed, physical therapy  evaluated the patient and recommended skilled nursing facility.  Code Status:     Code Status Orders  (From admission, onward)         Start     Ordered   05/04/19 1905   Full code  Continuous     05/04/19 1904        Code Status History    This patient has a current code status but no historical code status.   Advance Care Planning Activity        IV Access:    Peripheral IV   Procedures and diagnostic studies:   CARDIAC CATHETERIZATION  Result Date: 05/18/2019 1. Low filling pressures. 2. Preserved cardiac output. 3. Nonobstructive mild coronary disease. Nonischemic cardiomyopathy.  Will start Eliquis this evening.     Medical Consultants:    None.  Anti-Infectives:   None  Subjective:    Austin Cobb has no new complaints.  Objective:    Vitals:   05/19/19 1230 05/19/19 1231 05/19/19 1254 05/20/19 0500  BP: 121/88 121/88 (!) 136/92 (!) 148/102  Pulse:  (!) 117 (!) 113 (!) 106  Resp:   16 16  Temp:   98 F (36.7 C) 97.6 F (36.4 C)  TempSrc:   Oral Oral  SpO2:   97% 98%  Weight:      Height:       SpO2: 98 % O2 Flow Rate (L/min): 2 L/min   Intake/Output Summary (Last 24 hours) at 05/20/2019 0948 Last data filed at 05/19/2019 2229 Gross per 24 hour  Intake 353 ml  Output 525 ml  Net -172 ml   Filed Weights   05/16/19 0434 05/17/19 0652 05/18/19 0609  Weight: 58.4 kg 59.4 kg 56.1 kg    Exam: General exam: In no acute distress. Respiratory system: Good air movement and clear to auscultation. Cardiovascular system: S1 & S2 heard, RRR. No JVD. Gastrointestinal system: Abdomen is nondistended, soft and nontender.  Extremities: No pedal edema. Skin: No rashes, lesions or ulcers  Data Reviewed:    Labs: Basic Metabolic Panel: Recent Labs  Lab 05/15/19 0444 05/15/19 0444 05/16/19 0456 05/16/19 0456 05/17/19 0500 05/17/19 0500 05/18/19 0327 05/18/19 0327 05/18/19 1320 05/18/19 1320 05/18/19 1321 05/18/19 1321 05/19/19 0349 05/20/19 0402  NA 139   < > 140   < > 137   < > 140  --  140  --  141  --  139 139  K 4.2   < > 3.7   < > 4.4   < > 4.6   < > 4.1   < > 3.9   < > 4.4 4.8  CL 107   <  > 103  --  100  --  103  --   --   --   --   --  102 100  CO2 24   < > 27  --  28  --  26  --   --   --   --   --  26 29  GLUCOSE 98   < > 108*  --  84  --  98  --   --   --   --   --  92 97  BUN 12   < > 10  --  12  --  13  --   --   --   --   --  13 15  CREATININE 1.09   < > 1.19  --  1.11  --  1.16  --   --   --   --   --  1.00 1.28*  CALCIUM 8.7*   < > 8.5*  --  8.8*  --  8.8*  --   --   --   --   --  9.1 9.4  MG 1.7  --  1.7  --  1.9  --   --   --   --   --   --   --  1.7 1.8  PHOS 4.1   < > 5.0*  --  4.7*  --  4.3  --   --   --   --   --  4.7* 4.9*   < > = values in this interval not displayed.   GFR Estimated Creatinine Clearance: 49.3 mL/min (A) (by C-G formula based on SCr of 1.28 mg/dL (H)). Liver Function Tests: Recent Labs  Lab 05/14/19 0442 05/15/19 0444 05/16/19 0456 05/17/19 0500  AST 73* 59* 49* 43*  ALT 479* 431* 318* 249*  ALKPHOS 75 77 74 71  BILITOT 2.0* 1.5* 1.0 0.7  PROT 5.6* 6.1* 6.1* 6.3*  ALBUMIN 2.7* 3.0* 3.0* 3.1*   No results for input(s): LIPASE, AMYLASE in the last 168 hours. Recent Labs  Lab 05/16/19 0456 05/17/19 0500 05/18/19 0327 05/19/19 0349 05/20/19 0402  AMMONIA 19 18 15 29 13    Coagulation profile No results for input(s): INR, PROTIME in the last 168 hours. COVID-19 Labs  Recent Labs    05/18/19 0327 05/19/19 0349 05/20/19 0402  DDIMER 0.81* 0.90* 1.10*    Lab Results  Component Value Date   SARSCOV2NAA NEGATIVE 05/05/2019    CBC: Recent Labs  Lab 05/17/19 0800 05/17/19 0800 05/18/19 0327 05/18/19 0327 05/18/19 1320 05/18/19 1321 05/19/19 0349 05/19/19 1549 05/20/19 0402  WBC 8.2  --  7.2  --   --   --  6.9 6.5 7.1  NEUTROABS  --   --   --   --   --   --   --  4.1  --   HGB 11.3*   < > 11.8*   < > 12.6* 12.2* 12.5* 12.7* 12.9*  HCT 34.1*   < > 35.0*   < > 37.0* 36.0* 36.9* 37.5* 39.7  MCV 91.2  --  91.4  --   --   --  90.4 91.2 93.2  PLT 99*  --  106*  --   --   --  126* 130* 143*   < > = values in this  interval not displayed.   Cardiac Enzymes: No results for input(s): CKTOTAL, CKMB, CKMBINDEX, TROPONINI in the last 168 hours. BNP (last 3 results) No results for input(s): PROBNP in the last 8760 hours. CBG: No results for input(s): GLUCAP in the last 168 hours. D-Dimer: Recent Labs    05/19/19 0349 05/20/19 0402  DDIMER 0.90* 1.10*   Hgb A1c: No results for input(s): HGBA1C in the last 72 hours. Lipid Profile: No results for input(s): CHOL, HDL, LDLCALC, TRIG, CHOLHDL, LDLDIRECT in the last 72 hours. Thyroid function studies: No results for input(s): TSH, T4TOTAL, T3FREE, THYROIDAB in the last 72 hours.  Invalid input(s): FREET3 Anemia work up: No results for input(s): VITAMINB12, FOLATE, FERRITIN, TIBC, IRON, RETICCTPCT in the last 72 hours. Sepsis Labs: Recent Labs  Lab 05/18/19 0327 05/19/19 0349 05/19/19 1549 05/20/19 0402  WBC 7.2 6.9 6.5 7.1   Microbiology Recent Results (from the past 240 hour(s))  Culture, Urine     Status: Abnormal   Collection Time: 05/14/19  7:49 AM   Specimen: Urine, Random  Result  Value Ref Range Status   Specimen Description URINE, RANDOM  Final   Special Requests   Final    NONE Performed at [facility], [place] [address] [place], [place] [address]    Culture >=100,000 COLONIES/mL YEAST (A)  Final   Report Status 05/15/2019 FINAL  Final     Medications:   . amiodarone  200 mg Oral BID  . apixaban  5 mg Oral BID  . digoxin  0.125 mg Oral Daily  . feeding supplement (ENSURE ENLIVE)  237 mL Oral 4x daily  . fluconazole  200 mg Oral Daily  . folic acid  1 mg Intravenous Daily  . haloperidol lactate  1 mg Intravenous TID  . lactulose  20 g Oral BID   Or  . lactulose  300 mL Rectal BID  . LORazepam  0-4 mg Intravenous Q8H  . multivitamin with minerals  1 tablet Oral Daily  . nicotine  21 mg Transdermal Daily  . sacubitril-valsartan  1 tablet Oral BID  . sodium chloride flush  3 mL Intravenous Q12H  . spironolactone   25 mg Oral Daily  . thiamine  100 mg Oral Daily   Or  . thiamine  100 mg Intravenous Daily   Continuous Infusions: . sodium chloride        LOS: 16 days   Charlynne Cousins  [facility]  05/20/2019, 9:48 AM

## 2019-05-20 NOTE — Transfer of Care (Signed)
Immediate Anesthesia Transfer of Care Note  Patient: MOISE MALZAHN  Procedure(s) Performed: TRANSESOPHAGEAL ECHOCARDIOGRAM (TEE) (N/A ) CARDIOVERSION (N/A )  Patient Location: PACU  Anesthesia Type:General  Level of Consciousness: drowsy  Airway & Oxygen Therapy: Patient Spontanous Breathing and Patient connected to nasal cannula oxygen  Post-op Assessment: Report given to RN and Post -op Vital signs reviewed and stable  Post vital signs: Reviewed and stable  Last Vitals:  Vitals Value Taken Time  BP    Temp    Pulse 65 05/20/19 1454  Resp 17 05/20/19 1454  SpO2 96 % 05/20/19 1454  Vitals shown include unvalidated device data.  Last Pain:  Vitals:   05/20/19 1321  TempSrc: Oral  PainSc: 0-No pain      Patients Stated Pain Goal: 0 ([id number])  Complications: No apparent anesthesia complications

## 2019-05-20 NOTE — CV Procedure (Signed)
Procedure: TEE  Sedation: Per anesthesiology  Indication: atrial fibrillation  Findings: Please see echo section for full report.  Mildly dilated LV with normal wall thickness.  EF 25-30%, diffuse hypokinesis.  No LV thrombus noted.  Mildly dilated RV with moderately decreased systolic function.  Mildly dilated right atrium.  Mildly dilated left atrium, no LA appendage thrombus.  Trivial tricuspid regurgitation, peak RV-RA gradient 25 mmHg.  Mild mitral regurgitation.  Trileaflet aortic valve with no regurgitation or stenosis.  Normal caliber aorta with mild plaque.  No ASD or PFO, negative bubble study.   May proceed to DCCV.   Loralie Champagne 05/20/2019 2:49 PM

## 2019-05-20 NOTE — Interval H&P Note (Signed)
History and Physical Interval Note:  05/20/2019 2:32 PM  Austin Cobb  has presented today for surgery, with the diagnosis of afib.  The various methods of treatment have been discussed with the patient and family. After consideration of risks, benefits and other options for treatment, the patient has consented to  Procedure(s): TRANSESOPHAGEAL ECHOCARDIOGRAM (TEE) (N/A) CARDIOVERSION (N/A) as a surgical intervention.  The patient's history has been reviewed, patient examined, no change in status, stable for surgery.  I have reviewed the patient's chart and labs.  Questions were answered to the patient's satisfaction.     Curry Seefeldt [employer]

## 2019-05-20 NOTE — Progress Notes (Cosign Needed)
Name: Austin Cobb DOB: 11/13/1960  Please be advised that the above-named patient will require a short-term nursing home stay -- anticipated 30 days or less for rehabilitation and strengthening. The plan is for return home.

## 2019-05-20 NOTE — Anesthesia Postprocedure Evaluation (Signed)
Anesthesia Post Note  Patient: Austin Cobb  Procedure(s) Performed: TRANSESOPHAGEAL ECHOCARDIOGRAM (TEE) (N/A ) CARDIOVERSION (N/A )     Patient location during evaluation: PACU Anesthesia Type: MAC Level of consciousness: awake and alert Pain management: pain level controlled Vital Signs Assessment: post-procedure vital signs reviewed and stable Respiratory status: spontaneous breathing and respiratory function stable Cardiovascular status: stable Postop Assessment: no apparent nausea or vomiting Anesthetic complications: no    Last Vitals:  Vitals:   05/20/19 1505 05/20/19 1515  BP: (!) 159/109 (!) 170/99  Pulse: 79   Resp: (!) 21 (!) 24  Temp:    SpO2: 95%     Last Pain:  Vitals:   05/20/19 1455  TempSrc:   PainSc: Asleep                 Shawnay Bramel DANIEL

## 2019-05-20 NOTE — Anesthesia Preprocedure Evaluation (Addendum)
Anesthesia Evaluation  Patient identified by MRN, date of birth, ID band Patient awake    Reviewed: Allergy & Precautions, NPO status , Patient's Chart, lab work & pertinent test results  History of Anesthesia Complications Negative for: history of anesthetic complications  Airway Mallampati: II  TM Distance: >3 FB Neck ROM: Full    Dental  (+) Poor Dentition   Pulmonary Current Smoker,    Pulmonary exam normal        Cardiovascular hypertension, Pt. on medications and Pt. on home beta blockers +CHF  + dysrhythmias Atrial Fibrillation  Rhythm:Irregular  05/05/19 Echo  EF 10-15%! Cath 2021: 1. Low filling pressures.  2. Preserved cardiac output.  3. Nonobstructive mild coronary disease.   Nonischemic cardiomyopathy.   Neuro/Psych PSYCHIATRIC DISORDERS Bipolar Disorder    GI/Hepatic GERD  ,(+)     substance abuse  ,   Endo/Other  negative endocrine ROS  Renal/GU Cr 1.42 K+ 3.9     Musculoskeletal   Abdominal   Peds  Hematology Hgb 12.9   Anesthesia Other Findings   Reproductive/Obstetrics                            Anesthesia Physical  Anesthesia Plan  ASA: IV  Anesthesia Plan: MAC   Post-op Pain Management:    Induction: Intravenous  PONV Risk Score and Plan: Treatment may vary due to age or medical condition  Airway Management Planned: Natural Airway and Nasal Cannula  Additional Equipment: None  Intra-op Plan:   Post-operative Plan:   Informed Consent:   Plan Discussed with: Anesthesiologist  Anesthesia Plan Comments:         Anesthesia Quick Evaluation

## 2019-05-20 NOTE — Anesthesia Procedure Notes (Signed)
Procedure Name: General with mask airway Date/Time: 05/20/2019 2:26 PM Performed by: Imagene Riches, CRNA Pre-anesthesia Checklist: Patient identified, Emergency Drugs available, Suction available, Patient being monitored and Timeout performed Patient Re-evaluated:Patient Re-evaluated prior to induction Oxygen Delivery Method: Nasal cannula

## 2019-05-20 NOTE — Procedures (Signed)
Electrical Cardioversion Procedure Note Austin Cobb [id number] March 06, 1961  Procedure: Electrical Cardioversion Indications:  Atrial Fibrillation  Procedure Details Consent: Risks of procedure as well as the alternatives and risks of each were explained to the (patient/caregiver).  Consent for procedure obtained. Time Out: Verified patient identification, verified procedure, site/side was marked, verified correct patient position, special equipment/implants available, medications/allergies/relevent history reviewed, required imaging and test results available.  Performed  Patient placed on cardiac monitor, pulse oximetry, supplemental oxygen as necessary.  Sedation given: Propofol per anesthesiology Pacer pads placed anterior and posterior chest.  Cardioverted 1 time(s).  Cardioverted at [place].  Evaluation Findings: Post procedure EKG shows: NSR Complications: None Patient did tolerate procedure well.   Loralie Champagne 05/20/2019, 2:49 PM

## 2019-05-20 NOTE — TOC Progression Note (Signed)
Transition of Care [facility]) - Progression Note    Patient Details  Name: Austin Cobb MRN: [id number] Date of Birth: 1960/10/28  Transition of Care [facility]) CM/SW [place], [place] Phone Number: 05/20/2019, 11:53 AM  Clinical Narrative:   Patient has no bed offers at this time. CSW broadened SNF search to try to locate bed for patient. CSW to follow.    Expected Discharge Plan: Skilled Nursing Facility Barriers to Discharge: Active Substance Use - Placement, Continued Medical Work up, Ship broker, SNF Pending bed offer  Expected Discharge Plan and Services Expected Discharge Plan: [place] In-house Referral: Clinical Social Work     Living arrangements for the past 2 months: Mobile Home                                       Social Determinants of Health (SDOH) Interventions    Readmission Risk Interventions No flowsheet data found.

## 2019-05-20 NOTE — H&P (View-Only) (Signed)
Patient ID: Austin Cobb, male   DOB: 02-Oct-1960, 59 y.o.   MRN: [id number]     Advanced Heart Failure Rounding Note  PCP-Cardiologist: Donato Heinz, MD   Subjective:    Very weak getting up to chair.  He remains in atrial flutter rate 110s.  Denies dyspnea.     LHC/RHC:  Coronary Findings  Diagnostic Dominance: Right Left Main  Short, no significant disease.  Left Anterior Descending  No significant coronary disease.  Left Circumflex  No significant coronary disease.  Right Coronary Artery  40% proximal RCA stenosis.  Intervention  No interventions have been documented. Right Heart  Right Heart Pressures RHC Procedural Findings: Hemodynamics (mmHg) RA mean 3 RV 22/1 PA 22/7, mean 12 PCWP mean 6 LV 126/9 AO 138/75  Oxygen saturations: PA 74% AO 100%  Cardiac Output (Fick) 5.48  Cardiac Index (Fick) 3.19     Objective:   Weight Range: 56.1 kg Body mass index is 18.27 kg/m.   Vital Signs:   Temp:  [97.6 F (36.4 C)-98 F (36.7 C)] 97.6 F (36.4 C) (03/11 0500) Pulse Rate:  [101-117] 106 (03/11 0500) Resp:  [16] 16 (03/11 0500) BP: (116-148)/(88-102) 148/102 (03/11 0500) SpO2:  [97 %-98 %] 98 % (03/11 0500) Last BM Date: 05/19/19  Weight change: Filed Weights   05/16/19 0434 05/17/19 0652 05/18/19 0609  Weight: 58.4 kg 59.4 kg 56.1 kg    Intake/Output:   Intake/Output Summary (Last 24 hours) at 05/20/2019 0750 Last data filed at 05/19/2019 2229 Gross per 24 hour  Intake 353 ml  Output 525 ml  Net -172 ml      Physical Exam   General: NAD Neck: No JVD, no thyromegaly or thyroid nodule.  Lungs: Clear to auscultation bilaterally with normal respiratory effort. CV: Nondisplaced PMI.  Heart mildly tachy, regular S1/S2, no S3/S4, no murmur.  No peripheral edema.  No carotid bruit.  Normal pedal pulses.  Abdomen: Soft, nontender, no hepatosplenomegaly, no distention.  Skin: Intact without lesions or rashes.  Neurologic: Alert  and oriented x 3.  Psych: Normal affect. Extremities: No clubbing or cyanosis.  HEENT: Normal.    Telemetry   A flutter 110s, personally reviewed.   Labs    CBC Recent Labs    05/19/19 1549 05/20/19 0402  WBC 6.5 7.1  NEUTROABS 4.1  --   HGB 12.7* 12.9*  HCT 37.5* 39.7  MCV 91.2 93.2  PLT 130* [id number]*   Basic Metabolic Panel Recent Labs    05/19/19 0349 05/20/19 0402  NA 139 139  K 4.4 4.8  CL 102 100  CO2 26 29  GLUCOSE 92 97  BUN 13 15  CREATININE 1.00 1.28*  CALCIUM 9.1 9.4  MG 1.7 1.8  PHOS 4.7* 4.9*   Liver Function Tests No results for input(s): AST, ALT, ALKPHOS, BILITOT, PROT, ALBUMIN in the last 72 hours. No results for input(s): LIPASE, AMYLASE in the last 72 hours. Cardiac Enzymes No results for input(s): CKTOTAL, CKMB, CKMBINDEX, TROPONINI in the last 72 hours.  BNP: BNP (last 3 results) Recent Labs    05/04/19 1540  BNP 2,060.2*    ProBNP (last 3 results) No results for input(s): PROBNP in the last 8760 hours.   D-Dimer Recent Labs    05/19/19 0349 05/20/19 0402  DDIMER 0.90* 1.10*   Hemoglobin A1C No results for input(s): HGBA1C in the last 72 hours. Fasting Lipid Panel No results for input(s): CHOL, HDL, LDLCALC, TRIG, CHOLHDL, LDLDIRECT in the last 72 hours.  Thyroid Function Tests No results for input(s): TSH, T4TOTAL, T3FREE, THYROIDAB in the last 72 hours.  Invalid input(s): FREET3  Other results:   Imaging    No results found.   Medications:     Scheduled Medications: . amiodarone  200 mg Oral BID  . apixaban  5 mg Oral BID  . digoxin  0.125 mg Oral Daily  . feeding supplement (ENSURE ENLIVE)  237 mL Oral 4x daily  . fluconazole  200 mg Oral Daily  . folic acid  1 mg Intravenous Daily  . haloperidol lactate  1 mg Intravenous TID  . lactulose  20 g Oral BID   Or  . lactulose  300 mL Rectal BID  . LORazepam  0-4 mg Intravenous Q8H  . multivitamin with minerals  1 tablet Oral Daily  . nicotine  21 mg  Transdermal Daily  . sacubitril-valsartan  1 tablet Oral BID  . sodium chloride flush  3 mL Intravenous Q12H  . spironolactone  25 mg Oral Daily  . thiamine  100 mg Oral Daily   Or  . thiamine  100 mg Intravenous Daily    Infusions: . sodium chloride      PRN Medications: acetaminophen, hydrALAZINE, LORazepam **OR** LORazepam, metoprolol tartrate, ondansetron (ZOFRAN) IV, sodium chloride flush   Assessment/Plan   1. Altered mental status/delirium: Suspect ETOH withdrawal with significant rise in BP and history of heavy ETOH.  Also possible hepatic encephalopathy with elevated NH3.  This seems to have resolved, NH3 normalized.  He remains intermittently confused and agitated, ?baseline component of dementia related to ETOH abuse.  2.  AKI: Suspect cardiorenal, possibly with low output. Contrast nephropathy from PE CT may also play a role.  This has resolved.  3. Acute systolic CHF: Nonischemic cardiomyopathy.  Echo this admission with EF 15%, severe RV dysfunction.  No prior cardiac history.  Etiology is uncertain => could be tachycardia-mediated CMP but exertional dyspnea pre-dated palpitations, could be due to long-standing heavy ETOH use, could be viral cardiomyopathy.  Cath 3/9 showed no significant CAD. He had DCCV but recurrence of atrial flutter on 2/27  and remains in flutter today, rates in the 100s now. Initially on milrinone, now off.  Good cardiac output on RHC 3/9, filling pressures low. Volume status looks ok on exam today.   - Continue spironolactone 25 mg daily.   - Continue Entresto 49/51 bid.     - Continue digoxin 0.125 daily, level 0.8 today.  4. Atrial flutter: AFL with RVR at admission, uncertain how long.  He had felt palpitations for about a week prior to admission. Possible component of tachy-mediated CMP.  He is s/p TEE-guided DCCV to NSR but atrial flutter recurred on 2/27 while on milrinone.  Still in flutter in 100s.  - Continue amiodarone 200 mg twice a day.  -  Repeat TEE-DCCV today (would repeat TEE as he has not been continuously anticoagulated with cath and transition to apixaban yesterday). We discussed risks/benefits and he agrees to procedure.  - Consider ablation down the road, especially if he can stop drinking.  - Continue apixaban.   5. ETOH abuse: Delirium tremens this admission.  Improved but still confused, ?some degree of baseline dementia.  Imperative to quit ETOH but not sure how successful he will be with this if he goes home after this admission.   6. Thrombocytopenia: ?HIT with fall in platelets on heparin gtt, transitioned to bivalirudin and now on Eliquis. HIT Ab+ initially with fall in plts on heparin  gtt.  Plts recovered on bivalirudin, but HIT SRA negative.  Clinically, still looks like HIT.  Platelets trending up.  - Stopped Librium as there is some risk for thrombocytopenia from this.  - No other med is an obvious culprit. 7. Deconditioning: He will need SNF.   Length of Stay: [place], [place]  05/20/2019, 7:50 AM  Advanced Heart Failure Team Pager [phone] (M-F; 7a - 4p)  Please contact [facility] for night-coverage after hours (4p -7a ) and weekends on [email or web address]

## 2019-05-21 ENCOUNTER — Encounter: Payer: Self-pay | Admitting: *Deleted

## 2019-05-21 DIAGNOSIS — I484 Atypical atrial flutter: Secondary | ICD-10-CM

## 2019-05-21 DIAGNOSIS — E43 Unspecified severe protein-calorie malnutrition: Secondary | ICD-10-CM

## 2019-05-21 LAB — CBC
HCT: 33.3 % — ABNORMAL LOW (ref 39.0–52.0)
Hemoglobin: 11.2 g/dL — ABNORMAL LOW (ref 13.0–17.0)
MCH: 30.6 pg (ref 26.0–34.0)
MCHC: 33.6 g/dL (ref 30.0–36.0)
MCV: 91 fL (ref 80.0–100.0)
Platelets: UNDETERMINED 10*3/uL (ref 150–400)
RBC: 3.66 MIL/uL — ABNORMAL LOW (ref 4.22–5.81)
RDW: 13.9 % (ref 11.5–15.5)
WBC: 13 10*3/uL — ABNORMAL HIGH (ref 4.0–10.5)
nRBC: 0 % (ref 0.0–0.2)

## 2019-05-21 LAB — MAGNESIUM: Magnesium: 1.5 mg/dL — ABNORMAL LOW (ref 1.7–2.4)

## 2019-05-21 LAB — BASIC METABOLIC PANEL
Anion gap: 11 (ref 5–15)
BUN: 13 mg/dL (ref 6–20)
CO2: 25 mmol/L (ref 22–32)
Calcium: 8.7 mg/dL — ABNORMAL LOW (ref 8.9–10.3)
Chloride: 99 mmol/L (ref 98–111)
Creatinine, Ser: 1.09 mg/dL (ref 0.61–1.24)
GFR calc Af Amer: >60 mL/min (ref >60)
GFR calc non Af Amer: >60 mL/min (ref >60)
Glucose, Bld: 109 mg/dL — ABNORMAL HIGH (ref 70–99)
Potassium: 4.4 mmol/L (ref 3.5–5.1)
Sodium: 135 mmol/L (ref 135–145)

## 2019-05-21 LAB — PHOSPHORUS: Phosphorus: 4.1 mg/dL (ref 2.5–4.6)

## 2019-05-21 LAB — D-DIMER, QUANTITATIVE: D-Dimer, Quant: 1.29 ug/mL-FEU — ABNORMAL HIGH (ref 0.00–0.50)

## 2019-05-21 LAB — AMMONIA: Ammonia: 19 umol/L (ref 9–35)

## 2019-05-21 MED ORDER — CARVEDILOL 3.125 MG PO TABS
3.1250 mg | ORAL_TABLET | Freq: Two times a day (BID) | ORAL | Status: DC
  Administered 2019-05-21 – 2019-05-26 (×11): 3.125 mg via ORAL
  Filled 2019-05-21 (×12): qty 1

## 2019-05-21 NOTE — TOC Progression Note (Addendum)
Transition of Care [facility]) - Progression Note    Patient Details  Name: Austin Cobb MRN: [id number] Date of Birth: 27-Dec-1960  Transition of Care [facility]) CM/SW [place], Agency Phone Number: 05/21/2019, 11:39 AM  Clinical Narrative:    Update: CSW contacted [place] and spoke with Noah Delaine. CSW was informed "The Care Line" would be consulted about patient's possible weekend admission and CSW will be contacted once reviewed.  CSW made telephone call to patient's roommate [name] [phone]. CSW provided introduction of name, role and reason for call. CSW provided [name] with the facilities that have offered a bed for patient and asked which facility would be best. [name] requested [facility], considering it is closer. She asked to be informed prior to patient's discharge and had no further questions at this time.  CSW initiated insurance authorization with [facility] Reference [id number]. PASRR documentation has been uploaded and is pending. Covid test requested. TOC team will continue to follow and assist with discharge planning needs.    Expected Discharge Plan: Skilled Nursing Facility Barriers to Discharge: Active Substance Use - Placement, Continued Medical Work up, Ship broker, SNF Pending bed offer  Expected Discharge Plan and Services Expected Discharge Plan: [place] In-house Referral: Clinical Social Work     Living arrangements for the past 2 months: Mobile Home                                       Social Determinants of Health (SDOH) Interventions    Readmission Risk Interventions No flowsheet data found.
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
## 2019-05-21 NOTE — Progress Notes (Signed)
Patient ID: Austin Cobb, male   DOB: [birth year], 59 y.o.   MRN: [id number]     Advanced Heart Failure Rounding Note  PCP-Cardiologist: Donato Heinz, MD   Subjective:    TEE-DCCV on 3/11, now back in NSR.  Says he feels better.  Still weak with rambling conversation. SBP in 140s this morning, higher overnight.   TEE: EF 25-30%, diffuse hypokinesis, moderately decreased RV systolic function.     LHC/RHC:  Coronary Findings  Diagnostic Dominance: Right Left Main  Short, no significant disease.  Left Anterior Descending  No significant coronary disease.  Left Circumflex  No significant coronary disease.  Right Coronary Artery  40% proximal RCA stenosis.  Intervention  No interventions have been documented. Right Heart  Right Heart Pressures RHC Procedural Findings: Hemodynamics (mmHg) RA mean 3 RV 22/1 PA 22/7, mean 12 PCWP mean 6 LV 126/9 AO 138/75  Oxygen saturations: PA 74% AO 100%  Cardiac Output (Fick) 5.48  Cardiac Index (Fick) 3.19     Objective:   Weight Range: 55.4 kg Body mass index is 18.04 kg/m.   Vital Signs:   Temp:  [97.7 F (36.5 C)-99.5 F (37.5 C)] 98.9 F (37.2 C) (03/12 0347) Pulse Rate:  [66-91] 81 (03/12 0347) Resp:  [18-24] 18 (03/12 0347) BP: (142-170)/(81-109) 142/81 (03/12 0347) SpO2:  [95 %-100 %] 98 % (03/12 0347) Weight:  [55.4 kg] 55.4 kg (03/12 0500) Last BM Date: 05/19/19  Weight change: Filed Weights   05/17/19 0652 05/18/19 0609 05/21/19 0500  Weight: 59.4 kg 56.1 kg 55.4 kg    Intake/Output:   Intake/Output Summary (Last 24 hours) at 05/21/2019 0811 Last data filed at 05/21/2019 0641 Gross per 24 hour  Intake 540 ml  Output 300 ml  Net 240 ml      Physical Exam   General: NAD Neck: No JVD, no thyromegaly or thyroid nodule.  Lungs: Clear to auscultation bilaterally with normal respiratory effort. CV: Nondisplaced PMI.  Heart regular S1/S2, no S3/S4, no murmur.  No peripheral edema.    Abdomen: Soft, nontender, no hepatosplenomegaly, no distention.  Skin: Intact without lesions or rashes.  Neurologic: Alert and oriented x 3.  Psych: Normal affect. Extremities: No clubbing or cyanosis.  HEENT: Normal.    Telemetry   NSR 80s, personally reviewed.   Labs    CBC Recent Labs    05/19/19 1549 05/19/19 1549 05/20/19 0402 05/21/19 0311  WBC 6.5   < > 7.1 13.0*  NEUTROABS 4.1  --   --   --   HGB 12.7*   < > 12.9* 11.2*  HCT 37.5*   < > 39.7 33.3*  MCV 91.2   < > 93.2 91.0  PLT 130*   < > 143* PLATELET CLUMPS NOTED ON SMEAR, UNABLE TO ESTIMATE   < > = values in this interval not displayed.   Basic Metabolic Panel Recent Labs    05/20/19 0402 05/21/19 0311  NA 139 135  K 4.8 4.4  CL 100 99  CO2 29 25  GLUCOSE 97 109*  BUN 15 13  CREATININE 1.28* 1.09  CALCIUM 9.4 8.7*  MG 1.8 1.5*  PHOS 4.9* 4.1   Liver Function Tests No results for input(s): AST, ALT, ALKPHOS, BILITOT, PROT, ALBUMIN in the last 72 hours. No results for input(s): LIPASE, AMYLASE in the last 72 hours. Cardiac Enzymes No results for input(s): CKTOTAL, CKMB, CKMBINDEX, TROPONINI in the last 72 hours.  BNP: BNP (last 3 results) Recent Labs  05/04/19 1540  BNP 2,060.2*    ProBNP (last 3 results) No results for input(s): PROBNP in the last 8760 hours.   D-Dimer Recent Labs    05/20/19 0402 05/21/19 0311  DDIMER 1.10* 1.29*   Hemoglobin A1C No results for input(s): HGBA1C in the last 72 hours. Fasting Lipid Panel No results for input(s): CHOL, HDL, LDLCALC, TRIG, CHOLHDL, LDLDIRECT in the last 72 hours. Thyroid Function Tests No results for input(s): TSH, T4TOTAL, T3FREE, THYROIDAB in the last 72 hours.  Invalid input(s): FREET3  Other results:   Imaging    No results found.   Medications:     Scheduled Medications: . amiodarone  200 mg Oral BID  . apixaban  5 mg Oral BID  . carvedilol  3.125 mg Oral BID WC  . digoxin  0.125 mg Oral Daily  . feeding  supplement (ENSURE ENLIVE)  237 mL Oral 4x daily  . fluconazole  200 mg Oral Daily  . folic acid  1 mg Intravenous Daily  . haloperidol lactate  1 mg Intravenous TID  . isosorbide-hydrALAZINE  1 tablet Oral TID  . lactulose  20 g Oral BID   Or  . lactulose  300 mL Rectal BID  . multivitamin with minerals  1 tablet Oral Daily  . nicotine  21 mg Transdermal Daily  . sacubitril-valsartan  1 tablet Oral BID  . sodium chloride flush  3 mL Intravenous Q12H  . spironolactone  25 mg Oral Daily  . thiamine  100 mg Oral Daily   Or  . thiamine  100 mg Intravenous Daily    Infusions:   PRN Medications: acetaminophen, haloperidol lactate, hydrALAZINE, metoprolol tartrate, ondansetron (ZOFRAN) IV, sodium chloride flush   Assessment/Plan   1. Altered mental status/delirium: Suspect ETOH withdrawal with significant rise in BP and history of heavy ETOH.  Also possible hepatic encephalopathy with elevated NH3.  This seems to have resolved, NH3 normalized.  He remains intermittently confused and agitated, ?baseline component of dementia related to ETOH abuse.  2.  AKI: Suspect cardiorenal, possibly with low output. Contrast nephropathy from PE CT may also play a role.  This has resolved.  3. Acute systolic CHF: Nonischemic cardiomyopathy.  Echo this admission with EF 15%, severe RV dysfunction.  No prior cardiac history.  Etiology is uncertain => could be tachycardia-mediated CMP but exertional dyspnea pre-dated palpitations, could be due to long-standing heavy ETOH use, could be viral cardiomyopathy.  Cath 3/9 showed no significant CAD. He had DCCV but recurrence of atrial flutter on 2/27.  TEE-DCCV on 3/11, remains in NSR now.  TEE 3/11 with LV EF 25-30%, moderately decreased RV systolic function. Initially on milrinone, now off. Good cardiac output on RHC 3/9, filling pressures low. Volume status looks ok on exam today.  BP high.  - Continue spironolactone 25 mg daily.   - Continue Entresto 49/51 bid.      - Continue digoxin 0.125 daily - He is now on Bidil 1 tab tid.  - Start Coreg 3.125 mg bid today.   - Continue to uptitrate his cardiac meds gradually as BP tolerates, remains high.  - Echo in 2-3 months to see if EF improves in NSR (and hopefully off ETOH).  4. Atrial flutter: AFL with RVR at admission, uncertain how long.  He had felt palpitations for about a week prior to admission. Possible component of tachy-mediated CMP.  He is s/p TEE-guided DCCV to NSR but atrial flutter recurred on 2/27 while on milrinone.  Repeat DCCV  3/11, remains in NSR.   - Continue amiodarone 200 mg twice a day for 1 week longer, then 200 mg daily after that.   - Consider atrial flutter ablation down the road, especially if he can stop drinking.  - Continue apixaban.   5. ETOH abuse: Delirium tremens this admission.  Improved but still confused, ?some degree of baseline dementia.  Imperative to quit ETOH but not sure how successful he will be with this if he goes home after this admission.   6. Thrombocytopenia: ?HIT with fall in platelets on heparin gtt, transitioned to bivalirudin and now on Eliquis. HIT Ab+ initially with fall in plts on heparin gtt.  Plts recovered on bivalirudin, but HIT SRA negative.  Clinically, still looks like HIT.  Platelets trending up.  - Stopped Librium as there is some risk for thrombocytopenia from this.  - No other med is an obvious culprit. 7. Deconditioning: He will need SNF.   He is ready for SNF from my standpoint.  We will follow closely.   Length of Stay: 17  Loralie Champagne, MD  05/21/2019, 8:11 AM  Advanced Heart Failure Team Pager [phone] (M-F; [place])  Please contact [facility] for night-coverage after hours (4p -7a ) and weekends on [email or web address]

## 2019-05-22 LAB — BASIC METABOLIC PANEL
Anion gap: 11 (ref 5–15)
BUN: 25 mg/dL — ABNORMAL HIGH (ref 6–20)
CO2: 25 mmol/L (ref 22–32)
Calcium: 8.8 mg/dL — ABNORMAL LOW (ref 8.9–10.3)
Chloride: 99 mmol/L (ref 98–111)
Creatinine, Ser: 1.54 mg/dL — ABNORMAL HIGH (ref 0.61–1.24)
GFR calc Af Amer: 56 mL/min — ABNORMAL LOW (ref >60)
GFR calc non Af Amer: 49 mL/min — ABNORMAL LOW (ref >60)
Glucose, Bld: 112 mg/dL — ABNORMAL HIGH (ref 70–99)
Potassium: 4.3 mmol/L (ref 3.5–5.1)
Sodium: 135 mmol/L (ref 135–145)

## 2019-05-22 LAB — CBC
HCT: 31.4 % — ABNORMAL LOW (ref 39.0–52.0)
Hemoglobin: 10.6 g/dL — ABNORMAL LOW (ref 13.0–17.0)
MCH: 30.8 pg (ref 26.0–34.0)
MCHC: 33.8 g/dL (ref 30.0–36.0)
MCV: 91.3 fL (ref 80.0–100.0)
Platelets: 122 10*3/uL — ABNORMAL LOW (ref 150–400)
RBC: 3.44 MIL/uL — ABNORMAL LOW (ref 4.22–5.81)
RDW: 14.1 % (ref 11.5–15.5)
WBC: 10 10*3/uL (ref 4.0–10.5)
nRBC: 0 % (ref 0.0–0.2)

## 2019-05-22 LAB — AMMONIA: Ammonia: 31 umol/L (ref 9–35)

## 2019-05-22 LAB — MAGNESIUM: Magnesium: 1.9 mg/dL (ref 1.7–2.4)

## 2019-05-22 LAB — D-DIMER, QUANTITATIVE: D-Dimer, Quant: 0.53 ug/mL-FEU — ABNORMAL HIGH (ref 0.00–0.50)

## 2019-05-22 LAB — PHOSPHORUS: Phosphorus: 4.5 mg/dL (ref 2.5–4.6)

## 2019-05-22 MED ORDER — SACUBITRIL-VALSARTAN 24-26 MG PO TABS
1.0000 | ORAL_TABLET | Freq: Two times a day (BID) | ORAL | Status: DC
  Administered 2019-05-22 – 2019-05-26 (×9): 1 via ORAL
  Filled 2019-05-22 (×9): qty 1

## 2019-05-22 NOTE — Progress Notes (Signed)
[facility] PROGRESS NOTE    Progress Note  Austin Cobb  [id number] DOB: 08-28-1960 DOA: 05/04/2019 PCP: Patient, No Pcp Per     Brief Narrative:   Austin Cobb is an 59 y.o. male past medical history of anxiety disorder depression alcohol abuse and polysubstance abuse comes in with 1 progressive dyspnea on exertion and fatigue over 2 months.  In the ED she was found to be in atrial flutter with RVR, UDS was negative mildly elevated AST and ALT, BNP greater than 2000 EKG revealed a flutter with LVH, with elevated lactic acid COVID-19 test was negative cardiology was consulted for a flutter with RVR and acute heart failure. CT angio of the chest was negative for PE, 2D echo showed an ejection fraction of 10% with global hypokinesia,  cardioversion on 05/06/2019. Advanced heart failure team was consulted and she was started on IV Lasix.  Assessment/Plan:   Acute systolic heart failure/acute kidney injury/cardiorenal syndrome:: 2D echo on admission showed an EF of 15% with severe right ventricular dysfunction.  Etiology has remained unclear.  But could be tachycardia mediated and longstanding heavy use of alcohol as well as viral cardiomyopathy. Advance heart failure team was consulted she was started on amiodarone drip. Cardiac cath on 05/18/2019 showed no CAD. Right heart cath on 05/18/2019 showed good cardiac output. He was initially on a milrinone drip which has been held, due to the new development of a flutter. He was diuresed and his blood pressure has been erratic. Continue Aldactone, Entresto, digoxin, Coreg and BiDil.  Continue to titrate antihypertensive medication as an outpatient. He will need a repeat echo in 2 to 3 months. Patient will need skilled nursing facility placement.  Further management per cardiology.  A flutter with RVR: With a CHADS2-VASc > 2.  Now on apixaban. Initially started on amiodarone drip which has now been transitioned to oral. Due to the  concern of HIT she was started on bivalirudin. He had DCCV  atrial flutter again on 05/08/2019. Cardiology attempted second TEE and DCCV again on 3.11.2021, now in [place]. He will be continue on amiodarone 200 mg twice a day for 1 week and then 200 mg daily.  Alcohol withdrawal/acute metabolic encephalopathy/delirium tremens: Drug screen was negative, he has been monitored with CIWA protocol and showed no signs of withdrawal, his ammonia level was elevated, but is now resolved. She was started on Librium for encephalopathy, she attempted to leave AMA on 05/14/2019 as she became agitated. He is having intermittent confusion and agitation, question if at baseline he has a degree of dementia especially in the setting of severe alcohol abuse.  Acute kidney injury: With a baseline creatinine of 1.1. Likely cardiorenal syndrome, he developed new acute kidney injury on 05/22/2019 With new rise in creatinine today likely due to hypotension continue current management repeat basic metabolic panel in the morning.  Thrombocytopenia likely due to HIT: Question due to HIT he has been taken off heparin and transition to bivalirudin, now on Eliquis.  Platelets are recovering. High suspicious for HIT  Elevated troponins: Likely due to demand ischemia cardiac cath showed no obstructive disease.  Elevated LFTs: Likely due to alcohol abuse.  Liver enzymes slowly improving.  Bilateral pleural effusions: Noted.  Polysubstance abuse: Urine drug screen negative, cultures have remained negative he received 2 weeks of fluconazole as on 05/14/2019 his urine culture was positive for yeast.  Sick euthyroid syndrome: Thyroid function test will need to be repeated 6 weeks an outpatient.  Electrolyte imbalance:  Hypomagnesemia and hypokalemia: Try to keep magnesium greater than 2 potassium greater than 4.  New hematuria: Currently on Eliquis, hematuria has cleared likely traumatic in nature.  Discontinue  Foley.   DVT prophylaxis: Apixiban Family Communication:none Disposition Plan/Barrier to D/C:  Once he remains in sinus rhythm a  Code Status:     Code Status Orders  (From admission, onward)         Start     Ordered   05/04/19 1905  Full code  Continuous     05/04/19 1904        Code Status History    This patient has a current code status but no historical code status.   Advance Care Planning Activity        IV Access:    Peripheral IV   Procedures and diagnostic studies:   No results found.   Medical Consultants:    None.  Anti-Infectives:   None  Subjective:    Austin Cobb has no new complaints.  Objective:    Vitals:   05/21/19 0500 05/21/19 1410 05/21/19 2137 05/22/19 0300  BP:  96/70 (!) 80/53 (!) 95/56  Pulse:  74 74 67  Resp:   19 20  Temp:  98.6 F (37 C) 99.6 F (37.6 C) 99.3 F (37.4 C)  TempSrc:  Oral Oral Oral  SpO2:  (!) 84% 100% 100%  Weight: 55.4 kg   56.5 kg  Height:       SpO2: 100 % O2 Flow Rate (L/min): 2 L/min   Intake/Output Summary (Last 24 hours) at 05/22/2019 0912 Last data filed at 05/21/2019 1900 Gross per 24 hour  Intake 900 ml  Output --  Net 900 ml   Filed Weights   05/18/19 0609 05/21/19 0500 05/22/19 0300  Weight: 56.1 kg 55.4 kg 56.5 kg    Exam: General exam: In no acute distress. Respiratory system: Good air movement and clear to auscultation. Cardiovascular system: S1 & S2 heard, RRR. No JVD. Gastrointestinal system: Abdomen is nondistended, soft and nontender.  Extremities: No pedal edema. Skin: No rashes, lesions or ulcers  Data Reviewed:    Labs: Basic Metabolic Panel: Recent Labs  Lab 05/17/19 0500 05/17/19 0500 05/18/19 0327 05/18/19 1320 05/18/19 1321 05/18/19 1321 05/19/19 0349 05/19/19 0349 05/20/19 0402 05/20/19 0402 05/21/19 0311 05/22/19 0309  NA 137   < > 140   < > 141  --  139  --  139  --  135 135  K 4.4   < > 4.6   < > 3.9   < > 4.4   < > 4.8   < >  4.4 4.3  CL 100   < > 103  --   --   --  102  --  100  --  99 99  CO2 28   < > 26  --   --   --  26  --  29  --  25 25  GLUCOSE 84   < > 98  --   --   --  92  --  97  --  109* 112*  BUN 12   < > 13  --   --   --  13  --  15  --  13 25*  CREATININE 1.11   < > 1.16  --   --   --  1.00  --  1.28*  --  1.09 1.54*  CALCIUM 8.8*   < > 8.8*  --   --   --  9.1  --  9.4  --  8.7* 8.8*  MG 1.9  --   --   --   --   --  1.7  --  1.8  --  1.5* 1.9  PHOS 4.7*   < > 4.3  --   --   --  4.7*  --  4.9*  --  4.1 4.5   < > = values in this interval not displayed.   GFR Estimated Creatinine Clearance: 41.3 mL/min (A) (by C-G formula based on SCr of 1.54 mg/dL (H)). Liver Function Tests: Recent Labs  Lab 05/16/19 0456 05/17/19 0500  AST 49* 43*  ALT 318* 249*  ALKPHOS 74 71  BILITOT 1.0 0.7  PROT 6.1* 6.3*  ALBUMIN 3.0* 3.1*   No results for input(s): LIPASE, AMYLASE in the last 168 hours. Recent Labs  Lab 05/18/19 0327 05/19/19 0349 05/20/19 0402 05/21/19 0311 05/22/19 0309  AMMONIA 15 29 13 19 31    Coagulation profile No results for input(s): INR, PROTIME in the last 168 hours. COVID-19 Labs  Recent Labs    05/20/19 0402 05/21/19 0311 05/22/19 0309  DDIMER 1.10* 1.29* 0.53*    Lab Results  Component Value Date   SARSCOV2NAA NEGATIVE 05/05/2019    CBC: Recent Labs  Lab 05/19/19 0349 05/19/19 1549 05/20/19 0402 05/21/19 0311 05/22/19 0309  WBC 6.9 6.5 7.1 13.0* 10.0  NEUTROABS  --  4.1  --   --   --   HGB 12.5* 12.7* 12.9* 11.2* 10.6*  HCT 36.9* 37.5* 39.7 33.3* 31.4*  MCV 90.4 91.2 93.2 91.0 91.3  PLT 126* 130* 143* PLATELET CLUMPS NOTED ON SMEAR, UNABLE TO ESTIMATE 122*   Cardiac Enzymes: No results for input(s): CKTOTAL, CKMB, CKMBINDEX, TROPONINI in the last 168 hours. BNP (last 3 results) No results for input(s): PROBNP in the last 8760 hours. CBG: No results for input(s): GLUCAP in the last 168 hours. D-Dimer: Recent Labs    05/21/19 0311 05/22/19 0309   DDIMER 1.29* 0.53*   Hgb A1c: No results for input(s): HGBA1C in the last 72 hours. Lipid Profile: No results for input(s): CHOL, HDL, LDLCALC, TRIG, CHOLHDL, LDLDIRECT in the last 72 hours. Thyroid function studies: No results for input(s): TSH, T4TOTAL, T3FREE, THYROIDAB in the last 72 hours.  Invalid input(s): FREET3 Anemia work up: No results for input(s): VITAMINB12, FOLATE, FERRITIN, TIBC, IRON, RETICCTPCT in the last 72 hours. Sepsis Labs: Recent Labs  Lab 05/19/19 1549 05/20/19 0402 05/21/19 0311 05/22/19 0309  WBC 6.5 7.1 13.0* 10.0   Microbiology Recent Results (from the past 240 hour(s))  Culture, Urine     Status: Abnormal   Collection Time: 05/14/19  7:49 AM   Specimen: Urine, Random  Result Value Ref Range Status   Specimen Description URINE, RANDOM  Final   Special Requests   Final    NONE Performed at [facility], [address] [place], [place] [address]    Culture >=100,000 COLONIES/mL YEAST (A)  Final   Report Status 05/15/2019 FINAL  Final     Medications:   . amiodarone  200 mg Oral BID  . apixaban  5 mg Oral BID  . carvedilol  3.125 mg Oral BID WC  . digoxin  0.125 mg Oral Daily  . feeding supplement (ENSURE ENLIVE)  237 mL Oral 4x daily  . fluconazole  200 mg Oral Daily  . folic acid  1 mg Intravenous Daily  . haloperidol lactate  1 mg Intravenous TID  .  lactulose  20 g Oral BID   Or  . lactulose  300 mL Rectal BID  . multivitamin with minerals  1 tablet Oral Daily  . nicotine  21 mg Transdermal Daily  . sacubitril-valsartan  1 tablet Oral BID  . sodium chloride flush  3 mL Intravenous Q12H  . spironolactone  25 mg Oral Daily  . thiamine  100 mg Oral Daily   Or  . thiamine  100 mg Intravenous Daily   Continuous Infusions:     LOS: 18 days   Charlynne Cousins  [facility]  05/22/2019, 9:12 AM

## 2019-05-22 NOTE — Progress Notes (Signed)
Patient ID: Austin Cobb, male   DOB: 01-May-1960, 59 y.o.   MRN: [id number]     Advanced Heart Failure Rounding Note  PCP-Cardiologist: Donato Heinz, MD   Subjective:    TEE-DCCV on 3/11, now back in NSR.  Still weak.  SBP had been high, now SBP in 90s and creatinine up to 1.5. Afebrile (99/6) with normal WBCs.   TEE: EF 25-30%, diffuse hypokinesis, moderately decreased RV systolic function.     LHC/RHC:  Coronary Findings  Diagnostic Dominance: Right Left Main  Short, no significant disease.  Left Anterior Descending  No significant coronary disease.  Left Circumflex  No significant coronary disease.  Right Coronary Artery  40% proximal RCA stenosis.  Intervention  No interventions have been documented. Right Heart  Right Heart Pressures RHC Procedural Findings: Hemodynamics (mmHg) RA mean 3 RV 22/1 PA 22/7, mean 12 PCWP mean 6 LV 126/9 AO 138/75  Oxygen saturations: PA 74% AO 100%  Cardiac Output (Fick) 5.48  Cardiac Index (Fick) 3.19     Objective:   Weight Range: 56.5 kg Body mass index is 18.39 kg/m.   Vital Signs:   Temp:  [98.6 F (37 C)-99.6 F (37.6 C)] 99.3 F (37.4 C) (03/13 0300) Pulse Rate:  [67-74] 67 (03/13 0300) Resp:  [19-20] 20 (03/13 0300) BP: (80-96)/(53-70) 95/56 (03/13 0300) SpO2:  [84 %-100 %] 100 % (03/13 0300) Weight:  [56.5 kg] 56.5 kg (03/13 0300) Last BM Date: 05/20/19  Weight change: Filed Weights   05/18/19 0609 05/21/19 0500 05/22/19 0300  Weight: 56.1 kg 55.4 kg 56.5 kg    Intake/Output:   Intake/Output Summary (Last 24 hours) at 05/22/2019 0759 Last data filed at 05/21/2019 1900 Gross per 24 hour  Intake 1140 ml  Output --  Net 1140 ml      Physical Exam   General: NAD Neck: No JVD, no thyromegaly or thyroid nodule.  Lungs: Clear to auscultation bilaterally with normal respiratory effort. CV: Nondisplaced PMI.  Heart regular S1/S2, no S3/S4, no murmur.  No peripheral edema.   Abdomen:  Soft, nontender, no hepatosplenomegaly, no distention.  Skin: Intact without lesions or rashes.  Neurologic: Alert and oriented x 3.  Psych: Normal affect. Extremities: No clubbing or cyanosis.  HEENT: Normal.    Telemetry   NSR 80s, personally reviewed.   Labs    CBC Recent Labs    05/19/19 1549 05/20/19 0402 05/21/19 0311 05/22/19 0309  WBC 6.5   < > 13.0* 10.0  NEUTROABS 4.1  --   --   --   HGB 12.7*   < > 11.2* 10.6*  HCT 37.5*   < > 33.3* 31.4*  MCV 91.2   < > 91.0 91.3  PLT 130*   < > PLATELET CLUMPS NOTED ON SMEAR, UNABLE TO ESTIMATE 122*   < > = values in this interval not displayed.   Basic Metabolic Panel Recent Labs    05/21/19 0311 05/22/19 0309  NA 135 135  K 4.4 4.3  CL 99 99  CO2 25 25  GLUCOSE 109* 112*  BUN 13 25*  CREATININE 1.09 1.54*  CALCIUM 8.7* 8.8*  MG 1.5* 1.9  PHOS 4.1 4.5   Liver Function Tests No results for input(s): AST, ALT, ALKPHOS, BILITOT, PROT, ALBUMIN in the last 72 hours. No results for input(s): LIPASE, AMYLASE in the last 72 hours. Cardiac Enzymes No results for input(s): CKTOTAL, CKMB, CKMBINDEX, TROPONINI in the last 72 hours.  BNP: BNP (last 3 results) Recent  Labs    05/04/19 1540  BNP 2,060.2*    ProBNP (last 3 results) No results for input(s): PROBNP in the last 8760 hours.   D-Dimer Recent Labs    05/21/19 0311 05/22/19 0309  DDIMER 1.29* 0.53*   Hemoglobin A1C No results for input(s): HGBA1C in the last 72 hours. Fasting Lipid Panel No results for input(s): CHOL, HDL, LDLCALC, TRIG, CHOLHDL, LDLDIRECT in the last 72 hours. Thyroid Function Tests No results for input(s): TSH, T4TOTAL, T3FREE, THYROIDAB in the last 72 hours.  Invalid input(s): FREET3  Other results:   Imaging    No results found.   Medications:     Scheduled Medications: . amiodarone  200 mg Oral BID  . apixaban  5 mg Oral BID  . carvedilol  3.125 mg Oral BID WC  . digoxin  0.125 mg Oral Daily  . feeding  supplement (ENSURE ENLIVE)  237 mL Oral 4x daily  . fluconazole  200 mg Oral Daily  . folic acid  1 mg Intravenous Daily  . haloperidol lactate  1 mg Intravenous TID  . lactulose  20 g Oral BID   Or  . lactulose  300 mL Rectal BID  . multivitamin with minerals  1 tablet Oral Daily  . nicotine  21 mg Transdermal Daily  . sacubitril-valsartan  1 tablet Oral BID  . sodium chloride flush  3 mL Intravenous Q12H  . spironolactone  25 mg Oral Daily  . thiamine  100 mg Oral Daily   Or  . thiamine  100 mg Intravenous Daily    Infusions:   PRN Medications: acetaminophen, haloperidol lactate, hydrALAZINE, metoprolol tartrate, ondansetron (ZOFRAN) IV, sodium chloride flush   Assessment/Plan   1. Altered mental status/delirium: Suspect ETOH withdrawal with significant rise in BP and history of heavy ETOH.  Also possible hepatic encephalopathy with elevated NH3.  This seems to have resolved, NH3 normalized.  He remains intermittently confused and agitated, ?baseline component of dementia related to ETOH abuse.  2.  AKI: Suspect cardiorenal, possibly with low output. Contrast nephropathy from PE CT may also play a role.  Creatinine higher this morning at 1.5 with low BP.  3. Acute systolic CHF: Nonischemic cardiomyopathy.  Echo this admission with EF 15%, severe RV dysfunction.  No prior cardiac history.  Etiology is uncertain => could be tachycardia-mediated CMP but exertional dyspnea pre-dated palpitations, could be due to long-standing heavy ETOH use, could be viral cardiomyopathy.  Cath 3/9 showed no significant CAD. He had DCCV but recurrence of atrial flutter on 2/27.  TEE-DCCV on 3/11, remains in NSR now.  TEE 3/11 with LV EF 25-30%, moderately decreased RV systolic function. Initially on milrinone, now off. Good cardiac output on RHC 3/9, filling pressures low. Volume status looks ok on exam today.  BP had been high, now SBP in 90s with higher creatinine.  Afebrile, WBCs normal.   - Continue  spironolactone 25 mg daily.   - Decrease Entresto to 24/26 bid.     - Continue digoxin 0.125 daily - Stop Bidil with low BP.  - Continue low dose Coreg.   - Echo in 2-3 months to see if EF improves in NSR (and hopefully off ETOH).  4. Atrial flutter: AFL with RVR at admission, uncertain how long.  He had felt palpitations for about a week prior to admission. Possible component of tachy-mediated CMP.  He is s/p TEE-guided DCCV to NSR but atrial flutter recurred on 2/27 while on milrinone.  Repeat DCCV 3/11, remains  in NSR.   - Continue amiodarone 200 mg twice a day for 1 week longer, then 200 mg daily after that.   - Consider atrial flutter ablation down the road, especially if he can stop drinking.  - Continue apixaban.   5. ETOH abuse: Delirium tremens this admission.  Improved but still confused, ?some degree of baseline dementia.  Imperative to quit ETOH but not sure how successful he will be with this if he goes home after this admission.   6. Thrombocytopenia: ?HIT with fall in platelets on heparin gtt, transitioned to bivalirudin and now on Eliquis. HIT Ab+ initially with fall in plts on heparin gtt.  Plts recovered on bivalirudin, but HIT SRA negative.  Clinically, still looks like HIT.  Platelets trended up.  - Stopped Librium as there is some risk for thrombocytopenia from this.  - No other med is an obvious culprit. 7. Deconditioning: He will need SNF.   Awaiting SNF.   Length of Stay: 59  Loralie Champagne, MD  05/22/2019, 7:59 AM  Advanced Heart Failure Team Pager [phone] (M-F; [place])  Please contact [facility] for night-coverage after hours (4p -7a ) and weekends on [email or web address]

## 2019-05-23 LAB — CBC
HCT: 34.1 % — ABNORMAL LOW (ref 39.0–52.0)
Hemoglobin: 11.2 g/dL — ABNORMAL LOW (ref 13.0–17.0)
MCH: 30.4 pg (ref 26.0–34.0)
MCHC: 32.8 g/dL (ref 30.0–36.0)
MCV: 92.4 fL (ref 80.0–100.0)
Platelets: 135 10*3/uL — ABNORMAL LOW (ref 150–400)
RBC: 3.69 MIL/uL — ABNORMAL LOW (ref 4.22–5.81)
RDW: 14 % (ref 11.5–15.5)
WBC: 8 10*3/uL (ref 4.0–10.5)
nRBC: 0 % (ref 0.0–0.2)

## 2019-05-23 LAB — BASIC METABOLIC PANEL
Anion gap: 9 (ref 5–15)
BUN: 26 mg/dL — ABNORMAL HIGH (ref 6–20)
CO2: 30 mmol/L (ref 22–32)
Calcium: 9.3 mg/dL (ref 8.9–10.3)
Chloride: 99 mmol/L (ref 98–111)
Creatinine, Ser: 1.15 mg/dL (ref 0.61–1.24)
GFR calc Af Amer: >60 mL/min (ref >60)
GFR calc non Af Amer: >60 mL/min (ref >60)
Glucose, Bld: 106 mg/dL — ABNORMAL HIGH (ref 70–99)
Potassium: 4.4 mmol/L (ref 3.5–5.1)
Sodium: 138 mmol/L (ref 135–145)

## 2019-05-23 LAB — D-DIMER, QUANTITATIVE: D-Dimer, Quant: 0.47 ug/mL-FEU (ref 0.00–0.50)

## 2019-05-23 LAB — MAGNESIUM: Magnesium: 1.9 mg/dL (ref 1.7–2.4)

## 2019-05-23 LAB — PHOSPHORUS: Phosphorus: 3.3 mg/dL (ref 2.5–4.6)

## 2019-05-23 MED ORDER — FOLIC ACID 1 MG PO TABS
1.0000 mg | ORAL_TABLET | Freq: Every day | ORAL | Status: DC
  Administered 2019-05-23 – 2019-05-26 (×4): 1 mg via ORAL
  Filled 2019-05-23 (×3): qty 1

## 2019-05-23 NOTE — Social Work (Signed)
CSW received insurance authorization, reference [id number]. Good 3/12 thru 3/16. Care Coordinator is Festus Aloe. Fax for continued authorization is [phone].  Criss Alvine, CSW

## 2019-05-23 NOTE — Progress Notes (Signed)
[facility] PROGRESS NOTE    Progress Note  Austin Cobb  [id number] DOB: June 12, 1960 DOA: 05/04/2019 PCP: Patient, No Pcp Per     Brief Narrative:   Austin Cobb is an 59 y.o. male past medical history of anxiety disorder depression alcohol abuse and polysubstance abuse comes in with 1 progressive dyspnea on exertion and fatigue over 2 months.  In the ED she was found to be in atrial flutter with RVR, UDS was negative mildly elevated AST and ALT, BNP greater than 2000 EKG revealed a flutter with LVH, with elevated lactic acid COVID-19 test was negative cardiology was consulted for a flutter with RVR and acute heart failure. CT angio of the chest was negative for PE, 2D echo showed an ejection fraction of 10% with global hypokinesia,  cardioversion on 05/06/2019. Advanced heart failure team was consulted and she was started on IV Lasix.  Assessment/Plan:   Acute systolic heart failure/acute kidney injury/cardiorenal syndrome:: 2D echo on admission showed an EF of 15% with severe right ventricular dysfunction.  Etiology has remained unclear.  But could be tachycardia mediated and longstanding heavy use of alcohol as well as viral cardiomyopathy. Advance heart failure team was consulted she was started on amiodarone drip. Cardiac cath on 05/18/2019 showed no CAD. Right heart cath on 05/18/2019 showed good cardiac output. He was initially on a milrinone drip which has been held, due to the new development of a flutter. He was diuresed and his blood pressure has been erratic. Continue Aldactone, Entresto, digoxin, Coreg and BiDil.   Blood pressure is stable today we will continue current regimen as an outpatient. She he will need a repeat echo in 2 to 3 months. Patient will need skilled nursing facility placement.  Further management per cardiology.  A flutter with RVR: With a CHADS2-VASc > 2.  Now on apixaban. Initially started on amiodarone drip which has now been  transitioned to oral. Due to the concern of HIT she was started on bivalirudin. He had DCCV  atrial flutter again on 05/08/2019. Cardiology attempted second TEE and DCCV again on 3.11.2021, now in [place]. He will be continue on amiodarone 200 mg twice a day for 1 week and then 200 mg daily.  Alcohol withdrawal/acute metabolic encephalopathy/delirium tremens: Drug screen was negative, he has been monitored with CIWA protocol and showed no signs of withdrawal, his ammonia level was elevated, but is now resolved. She was started on Librium for encephalopathy, she attempted to leave AMA on 05/14/2019 as she became agitated. He is having intermittent confusion and agitation, question if at baseline he has a degree of dementia especially in the setting of severe alcohol abuse.  Acute kidney injury: With a baseline creatinine of 1.1. Likely cardiorenal syndrome, he developed new acute kidney injury on 05/22/2019 With new rise in creatinine today likely due to hypotension continue current management repeat basic metabolic panel in the morning.  Thrombocytopenia likely due to HIT: Question due to HIT he has been taken off heparin and transition to bivalirudin, now on Eliquis.  Platelets are recovering. High suspicious for HIT  Elevated troponins: Likely due to demand ischemia cardiac cath showed no obstructive disease.  Elevated LFTs: Likely due to alcohol abuse.  Liver enzymes slowly improving.  Bilateral pleural effusions: Noted.  Polysubstance abuse: Urine drug screen negative, cultures have remained negative he received 2 weeks of fluconazole as on 05/14/2019 his urine culture was positive for yeast.  Sick euthyroid syndrome: Thyroid function test will need to be repeated  6 weeks an outpatient.  Electrolyte imbalance: Hypomagnesemia and hypokalemia: Try to keep magnesium greater than 2 potassium greater than 4.  New hematuria: Currently on Eliquis, hematuria has cleared likely traumatic in  nature.  Discontinue Foley.   DVT prophylaxis: Apixiban Family Communication:none Disposition Plan/Barrier to D/C:  Awaiting SNF placement  Code Status:     Code Status Orders  (From admission, onward)         Start     Ordered   05/04/19 1905  Full code  Continuous     05/04/19 1904        Code Status History    This patient has a current code status but no historical code status.   Advance Care Planning Activity        IV Access:    Peripheral IV   Procedures and diagnostic studies:   No results found.   Medical Consultants:    None.  Anti-Infectives:   None  Subjective:    Austin Cobb has no new complaints.  Objective:    Vitals:   05/22/19 1012 05/22/19 1715 05/22/19 2106 05/23/19 0740  BP: 114/66 110/65 (!) 101/53 126/70  Pulse:  72 (!) 53 (!) 57  Resp:   18 (!) 22  Temp:  98.9 F (37.2 C) 98.5 F (36.9 C) 98.2 F (36.8 C)  TempSrc:  Oral Oral Oral  SpO2:  98% 98% 98%  Weight:    58.6 kg  Height:       SpO2: 98 % O2 Flow Rate (L/min): 2 L/min   Intake/Output Summary (Last 24 hours) at 05/23/2019 0902 Last data filed at 05/23/2019 0615 Gross per 24 hour  Intake 1200 ml  Output 500 ml  Net 700 ml   Filed Weights   05/21/19 0500 05/22/19 0300 05/23/19 0740  Weight: 55.4 kg 56.5 kg 58.6 kg    Exam: General exam: In no acute distress. Respiratory system: Good air movement and clear to auscultation. Cardiovascular system: S1 & S2 heard, RRR. No JVD. Gastrointestinal system: Abdomen is nondistended, soft and nontender.  Extremities: No pedal edema. Skin: No rashes, lesions or ulcers  Data Reviewed:    Labs: Basic Metabolic Panel: Recent Labs  Lab 05/19/19 0349 05/19/19 0349 05/20/19 0402 05/20/19 0402 05/21/19 0311 05/21/19 0311 05/22/19 0309 05/23/19 0420  NA 139  --  139  --  135  --  135 138  K 4.4   < > 4.8   < > 4.4   < > 4.3 4.4  CL 102  --  100  --  99  --  99 99  CO2 26  --  29  --  25  --  25  30  GLUCOSE 92  --  97  --  109*  --  112* 106*  BUN 13  --  15  --  13  --  25* 26*  CREATININE 1.00  --  1.28*  --  1.09  --  1.54* 1.15  CALCIUM 9.1  --  9.4  --  8.7*  --  8.8* 9.3  MG 1.7  --  1.8  --  1.5*  --  1.9 1.9  PHOS 4.7*  --  4.9*  --  4.1  --  4.5 3.3   < > = values in this interval not displayed.   GFR Estimated Creatinine Clearance: 57.3 mL/min (by C-G formula based on SCr of 1.15 mg/dL). Liver Function Tests: Recent Labs  Lab 05/17/19 0500  AST 43*  ALT 249*  ALKPHOS 71  BILITOT 0.7  PROT 6.3*  ALBUMIN 3.1*   No results for input(s): LIPASE, AMYLASE in the last 168 hours. Recent Labs  Lab 05/18/19 0327 05/19/19 0349 05/20/19 0402 05/21/19 0311 05/22/19 0309  AMMONIA 15 29 13 19 31    Coagulation profile No results for input(s): INR, PROTIME in the last 168 hours. COVID-19 Labs  Recent Labs    05/21/19 0311 05/22/19 0309 05/23/19 0420  DDIMER 1.29* 0.53* 0.47    Lab Results  Component Value Date   SARSCOV2NAA NEGATIVE 05/05/2019    CBC: Recent Labs  Lab 05/19/19 1549 05/20/19 0402 05/21/19 0311 05/22/19 0309 05/23/19 0420  WBC 6.5 7.1 13.0* 10.0 8.0  NEUTROABS 4.1  --   --   --   --   HGB 12.7* 12.9* 11.2* 10.6* 11.2*  HCT 37.5* 39.7 33.3* 31.4* 34.1*  MCV 91.2 93.2 91.0 91.3 92.4  PLT 130* 143* PLATELET CLUMPS NOTED ON SMEAR, UNABLE TO ESTIMATE 122* 135*   Cardiac Enzymes: No results for input(s): CKTOTAL, CKMB, CKMBINDEX, TROPONINI in the last 168 hours. BNP (last 3 results) No results for input(s): PROBNP in the last 8760 hours. CBG: No results for input(s): GLUCAP in the last 168 hours. D-Dimer: Recent Labs    05/22/19 0309 05/23/19 0420  DDIMER 0.53* 0.47   Hgb A1c: No results for input(s): HGBA1C in the last 72 hours. Lipid Profile: No results for input(s): CHOL, HDL, LDLCALC, TRIG, CHOLHDL, LDLDIRECT in the last 72 hours. Thyroid function studies: No results for input(s): TSH, T4TOTAL, T3FREE, THYROIDAB in the  last 72 hours.  Invalid input(s): FREET3 Anemia work up: No results for input(s): VITAMINB12, FOLATE, FERRITIN, TIBC, IRON, RETICCTPCT in the last 72 hours. Sepsis Labs: Recent Labs  Lab 05/20/19 0402 05/21/19 0311 05/22/19 0309 05/23/19 0420  WBC 7.1 13.0* 10.0 8.0   Microbiology Recent Results (from the past 240 hour(s))  Culture, Urine     Status: Abnormal   Collection Time: 05/14/19  7:49 AM   Specimen: Urine, Random  Result Value Ref Range Status   Specimen Description URINE, RANDOM  Final   Special Requests   Final    NONE Performed at [facility], [address], [place], [place] [address]    Culture >=100,000 COLONIES/mL YEAST (A)  Final   Report Status 05/15/2019 FINAL  Final     Medications:   . amiodarone  200 mg Oral BID  . apixaban  5 mg Oral BID  . carvedilol  3.125 mg Oral BID WC  . digoxin  0.125 mg Oral Daily  . feeding supplement (ENSURE ENLIVE)  237 mL Oral 4x daily  . fluconazole  200 mg Oral Daily  . folic acid  1 mg Intravenous Daily  . haloperidol lactate  1 mg Intravenous TID  . lactulose  20 g Oral BID   Or  . lactulose  300 mL Rectal BID  . multivitamin with minerals  1 tablet Oral Daily  . nicotine  21 mg Transdermal Daily  . sacubitril-valsartan  1 tablet Oral BID  . sodium chloride flush  3 mL Intravenous Q12H  . spironolactone  25 mg Oral Daily  . thiamine  100 mg Oral Daily   Or  . thiamine  100 mg Intravenous Daily   Continuous Infusions:     LOS: 19 days   Charlynne Cousins  [facility]  05/23/2019, 9:02 AM

## 2019-05-24 LAB — SARS CORONAVIRUS 2 (TAT 6-24 HRS): SARS Coronavirus 2: NEGATIVE

## 2019-05-24 LAB — D-DIMER, QUANTITATIVE: D-Dimer, Quant: 0.7 ug/mL-FEU — ABNORMAL HIGH (ref 0.00–0.50)

## 2019-05-24 LAB — PHOSPHORUS: Phosphorus: 4.7 mg/dL — ABNORMAL HIGH (ref 2.5–4.6)

## 2019-05-24 MED ORDER — TRAZODONE HCL 50 MG PO TABS: 50.0000 mg | ORAL_TABLET | Freq: Every evening | ORAL | Status: DC | PRN

## 2019-05-24 NOTE — Progress Notes (Signed)
Nutrition Follow-up  **RD working remotely**  DOCUMENTATION CODES:   Severe malnutrition in context of social or environmental circumstances  INTERVENTION:  Continue Ensure Enlive po QID, each supplement provides 350 kcal and 20 grams of protein  Continue MVI daily   NUTRITION DIAGNOSIS:   Severe Malnutrition related to social / environmental circumstances as evidenced by energy intake < 75% for > or equal to 1 month, mild fat depletion, severe fat depletion, mild muscle depletion, severe muscle depletion, percent weight loss.  Ongoing.   GOAL:   Patient will meet greater than or equal to 90% of their needs  Progressing.  MONITOR:   PO intake, Supplement acceptance, Weight trends, Labs, I & O's  REASON FOR ASSESSMENT:   Consult Assessment of nutrition requirement/status  ASSESSMENT:   Pt with a PMH significant for tobacco, EtOH and polysubstance abuse, bipolar disorder, anxiety and depression presented with progressive dyspnea and fatigue. Pt admitted for atrial fluter with RVR and acute CHF  RD unable to reach pt via phone. Discussed pt with RN.   PO Intake: 100% x last 8 recorded meals   Admission weight: 61.6 kg Current weight: 56.8 kg  Medications reviewed and include: Ensure Enlive QID, Folvite, Chronulac, MVI, Aldactone, Thiamine  Labs reviewed: Phosphorus 4.7 (H)   Diet Order:   Diet Order            Diet Heart Room service appropriate? Yes; Fluid consistency: Thin  Diet effective now              EDUCATION NEEDS:   No education needs have been identified at this time  Skin:  Skin Assessment: Reviewed RN Assessment  Last BM:  3/14  Height:   Ht Readings from Last 1 Encounters:  05/06/19 5\' 9"  (1.753 m)    Weight:   Wt Readings from Last 1 Encounters:  05/24/19 56.8 kg    BMI:  Body mass index is 18.49 kg/m.  Estimated Nutritional Needs:   Kcal:  1900-2100  Protein:  95-110 grams  Fluid:  >1.9L/d or per MD   Larkin Ina, MS, RD, LDN RD pager number and weekend/on-call pager number located in [place].

## 2019-05-24 NOTE — Social Work (Signed)
CSW met with patient bedside so an in-person PASRR review could be completed. Patient able to discharge to [facility] once received. Contact is Lorie [phone].  CSW updated patient's insurance authorization ref [id number]. Good 3/16 thru 3/18. Coordinator is Festus Aloe, fax [phone]. TOC team will continue to follow and assist with discharge planning needs.  Criss Alvine, CSW

## 2019-05-24 NOTE — Progress Notes (Signed)
Physical Therapy Treatment Patient Details Name: Austin Cobb MRN: [id number] DOB: 08-28-1960 Today's Date: 05/24/2019    History of Present Illness Patient is a 59 year old African-American male with past medical history significant for tobacco use disorder, substance abuse (crack and marijuana), bipolar disorder and lifelong alcoholism.  Patient's last drink was 2 days ago.  Patient presents with 1 day history of worsening shortness of breath.    PT Comments    Patient received with social worker in the room. Patient more alert and conversational. He continues to have decreased safety awareness with mobility and continues to have decreased balance. He requires supervision/min guard with transfers and ambulation to remain safe at this time. He will continue to benefit from skilled PT while here to improve strength, balance and functional independence.    Follow Up Recommendations  SNF;Supervision/Assistance - 24 hour     Equipment Recommendations  None recommended by PT    Recommendations for Other Services       Precautions / Restrictions Precautions Precautions: Fall Precaution Comments: pt bipolar and becomes agitated easily and is very particular Restrictions Weight Bearing Restrictions: No    Mobility  Bed Mobility Overal bed mobility: Independent Bed Mobility: Supine to Sit     Supine to sit: Independent        Transfers Overall transfer level: Needs assistance   Transfers: Sit to/from Stand Sit to Stand: Supervision;Min guard         General transfer comment: he is able to transfer without physical assist, but needs assistance for safety due to unsteadiness  Ambulation/Gait Ambulation/Gait assistance: Min guard Gait Distance (Feet): 200 Feet   Gait Pattern/deviations: Step-through pattern;Staggering left;Staggering right Gait velocity: dec   General Gait Details: min guard assist.   Stairs             Wheelchair Mobility    Modified  Rankin (Stroke Patients Only)       Balance Overall balance assessment: Needs assistance Sitting-balance support: Feet supported Sitting balance-Leahy Scale: Good     Standing balance support: No upper extremity supported;During functional activity Standing balance-Leahy Scale: Fair Standing balance comment: dependent on assist for safety and balance                            Cognition Arousal/Alertness: Awake/alert Behavior During Therapy: Restless;Impulsive Overall Cognitive Status: History of cognitive impairments - at baseline Area of Impairment: Attention;Safety/judgement;Awareness                   Current Attention Level: Sustained Memory: Decreased recall of precautions;Decreased short-term memory Following Commands: Follows one step commands with increased time;Follows one step commands inconsistently Safety/Judgement: Decreased awareness of safety;Decreased awareness of deficits Awareness: Intellectual Problem Solving: Requires verbal cues;Requires tactile cues General Comments: patient with decreased safety awareness, tries to get oob without assistance      Exercises      General Comments        Pertinent Vitals/Pain Pain Assessment: No/denies pain    Home Living                      Prior Function            PT Goals (current goals can now be found in the care plan section) Acute Rehab PT Goals Patient Stated Goal: patient with more clear verbalizations this visit. Agrees to go to SNF prior to home PT Goal Formulation: With patient Time For Goal Achievement:  05/31/19 Potential to Achieve Goals: Good Progress towards PT goals: Progressing toward goals    Frequency    Min 3X/week      PT Plan Current plan remains appropriate    Co-evaluation              AM-PAC PT "6 Clicks" Mobility   Outcome Measure  Help needed turning from your back to your side while in a flat bed without using bedrails?:  None Help needed moving from lying on your back to sitting on the side of a flat bed without using bedrails?: None Help needed moving to and from a bed to a chair (including a wheelchair)?: A Little Help needed standing up from a chair using your arms (e.g., wheelchair or bedside chair)?: A Little Help needed to walk in hospital room?: A Little Help needed climbing 3-5 steps with a railing? : A Little 6 Click Score: 20    End of Session   Activity Tolerance: Patient tolerated treatment well Patient left: in bed;with bed alarm set;with nursing/sitter in room Nurse Communication: Mobility status PT Visit Diagnosis: Unsteadiness on feet (R26.81);Muscle weakness (generalized) (M62.81);History of falling (Z91.81);Repeated falls (R29.6);Difficulty in walking, not elsewhere classified (R26.2)     Time: 1345-1405 PT Time Calculation (min) (ACUTE ONLY): 20 min  Charges:  $Gait Training: 8-22 mins                     Kirbie Stodghill, PT, GCS 05/24/19,2:19 PM

## 2019-05-24 NOTE — Progress Notes (Signed)
Patient ID: Austin Cobb, male   DOB: 07/19/1960, 59 y.o.   MRN: [id number]     Advanced Heart Failure Rounding Note  PCP-Cardiologist: Donato Heinz, MD   Subjective:    SBP stable today in 110s, more alert and conversational.  No chest pain or dyspnea.   TEE-DCCV on 3/11, now back in NSR.    TEE: EF 25-30%, diffuse hypokinesis, moderately decreased RV systolic function.     LHC/RHC:  Coronary Findings  Diagnostic Dominance: Right Left Main  Short, no significant disease.  Left Anterior Descending  No significant coronary disease.  Left Circumflex  No significant coronary disease.  Right Coronary Artery  40% proximal RCA stenosis.  Intervention  No interventions have been documented. Right Heart  Right Heart Pressures RHC Procedural Findings: Hemodynamics (mmHg) RA mean 3 RV 22/1 PA 22/7, mean 12 PCWP mean 6 LV 126/9 AO 138/75  Oxygen saturations: PA 74% AO 100%  Cardiac Output (Fick) 5.48  Cardiac Index (Fick) 3.19     Objective:   Weight Range: 56.8 kg Body mass index is 18.49 kg/m.   Vital Signs:   Temp:  [98 F (36.7 C)] 98 F (36.7 C) (03/15 0606) Pulse Rate:  [43] 43 (03/15 0606) Resp:  [20] 20 (03/15 0606) BP: (115)/(68) 115/68 (03/15 0606) SpO2:  [98 %] 98 % (03/15 0606) Weight:  [56.8 kg] 56.8 kg (03/15 0606) Last BM Date: 05/23/19  Weight change: Filed Weights   05/22/19 0300 05/23/19 0740 05/24/19 0606  Weight: 56.5 kg 58.6 kg 56.8 kg    Intake/Output:   Intake/Output Summary (Last 24 hours) at 05/24/2019 0804 Last data filed at 05/24/2019 0500 Gross per 24 hour  Intake 240 ml  Output 400 ml  Net -160 ml      Physical Exam   General: NAD Neck: No JVD, no thyromegaly or thyroid nodule.  Lungs: Clear to auscultation bilaterally with normal respiratory effort. CV: Nondisplaced PMI.  Heart regular S1/S2, no S3/S4, no murmur.  No peripheral edema.  Abdomen: Soft, nontender, no hepatosplenomegaly, no distention.   Skin: Intact without lesions or rashes.  Neurologic: Alert and oriented x 3.  Psych: Normal affect. Extremities: No clubbing or cyanosis.  HEENT: Normal.    Telemetry   NSR 60s, personally reviewed.   Labs    CBC Recent Labs    05/22/19 0309 05/23/19 0420  WBC 10.0 8.0  HGB 10.6* 11.2*  HCT 31.4* 34.1*  MCV 91.3 92.4  PLT 122* [id number]*   Basic Metabolic Panel Recent Labs    05/22/19 0309 05/23/19 0420  NA 135 138  K 4.3 4.4  CL 99 99  CO2 25 30  GLUCOSE 112* 106*  BUN 25* 26*  CREATININE 1.54* 1.15  CALCIUM 8.8* 9.3  MG 1.9 1.9  PHOS 4.5 3.3   Liver Function Tests No results for input(s): AST, ALT, ALKPHOS, BILITOT, PROT, ALBUMIN in the last 72 hours. No results for input(s): LIPASE, AMYLASE in the last 72 hours. Cardiac Enzymes No results for input(s): CKTOTAL, CKMB, CKMBINDEX, TROPONINI in the last 72 hours.  BNP: BNP (last 3 results) Recent Labs    05/04/19 1540  BNP 2,060.2*    ProBNP (last 3 results) No results for input(s): PROBNP in the last 8760 hours.   D-Dimer Recent Labs    05/22/19 0309 05/23/19 0420  DDIMER 0.53* 0.47   Hemoglobin A1C No results for input(s): HGBA1C in the last 72 hours. Fasting Lipid Panel No results for input(s): CHOL, HDL, LDLCALC, TRIG,  CHOLHDL, LDLDIRECT in the last 72 hours. Thyroid Function Tests No results for input(s): TSH, T4TOTAL, T3FREE, THYROIDAB in the last 72 hours.  Invalid input(s): FREET3  Other results:   Imaging    No results found.   Medications:     Scheduled Medications: . amiodarone  200 mg Oral BID  . apixaban  5 mg Oral BID  . carvedilol  3.125 mg Oral BID WC  . digoxin  0.125 mg Oral Daily  . feeding supplement (ENSURE ENLIVE)  237 mL Oral 4x daily  . fluconazole  200 mg Oral Daily  . folic acid  1 mg Oral Daily  . haloperidol lactate  1 mg Intravenous TID  . lactulose  20 g Oral BID   Or  . lactulose  300 mL Rectal BID  . multivitamin with minerals  1 tablet Oral  Daily  . nicotine  21 mg Transdermal Daily  . sacubitril-valsartan  1 tablet Oral BID  . sodium chloride flush  3 mL Intravenous Q12H  . spironolactone  25 mg Oral Daily  . thiamine  100 mg Oral Daily   Or  . thiamine  100 mg Intravenous Daily    Infusions:   PRN Medications: acetaminophen, hydrALAZINE, metoprolol tartrate, ondansetron (ZOFRAN) IV, sodium chloride flush   Assessment/Plan   1. Altered mental status/delirium: Suspect ETOH withdrawal with significant rise in BP and history of heavy ETOH.  Also possible hepatic encephalopathy with elevated NH3.  This seems to have resolved, NH3 normalized.  He has remained intermittently confused and agitated, ?baseline component of dementia related to ETOH abuse. Today, seems more clear.  2.  AKI: Suspect cardiorenal, possibly with low output. Contrast nephropathy from PE CT may also play a role.  Creatinine down to 1.15 today.   3. Acute systolic CHF: Nonischemic cardiomyopathy.  Echo this admission with EF 15%, severe RV dysfunction.  No prior cardiac history.  Etiology is uncertain => could be tachycardia-mediated CMP but exertional dyspnea pre-dated palpitations, could be due to long-standing heavy ETOH use, could be viral cardiomyopathy.  Cath 3/9 showed no significant CAD. He had DCCV but recurrence of atrial flutter on 2/27.  TEE-DCCV on 3/11, remains in NSR now.  TEE 3/11 with LV EF 25-30%, moderately decreased RV systolic function. Initially on milrinone, now off. Good cardiac output on RHC 3/9, filling pressures low. Volume status looks ok on exam today.  SBP stable in 110s today. - Continue spironolactone 25 mg daily.   - Continue Entresto 24/26 bid.     - Continue digoxin 0.125 daily, check level in am if he remains in hospital.  - Continue low dose Coreg 3.125 mg bid.   - Echo in 2-3 months to see if EF improves in NSR (and hopefully off ETOH).  4. Atrial flutter: AFL with RVR at admission, uncertain how long.  He had felt  palpitations for about a week prior to admission. Possible component of tachy-mediated CMP.  He is s/p TEE-guided DCCV to NSR but atrial flutter recurred on 2/27 while on milrinone.  Repeat DCCV 3/11, remains in NSR.   - Continue amiodarone 200 mg twice a day for 1 week longer, then 200 mg daily after that.   - Consider atrial flutter ablation down the road, especially if he can stop drinking.  - Continue apixaban.   5. ETOH abuse: Delirium tremens this admission.  Improved but still confused, ?some degree of baseline dementia.  Imperative to quit ETOH but not sure how successful he will be  with this if he goes home after this admission.   6. Thrombocytopenia: ?HIT with fall in platelets on heparin gtt, transitioned to bivalirudin and now on Eliquis. HIT Ab+ initially with fall in plts on heparin gtt.  Plts recovered on bivalirudin, but HIT SRA negative.  Clinically, still looks like HIT.  Platelets trended up.  - Stopped Librium as there is some risk for thrombocytopenia from this.  - No other med is an obvious culprit. 7. Deconditioning: Plan for rehab facility.  8. Disposition: He is ready for rehab facility from my standpoint.  He will need CHF clinic followup.  Meds for discharge: Amiodarone 200 mg bid x 1 week then 200 mg daily, apixaban 5 mg bid, Entresto 24/26 bid, spironolactone 25 daily, Coreg 3.125 mg bid, digoxin 0.125 mg daily.   Awaiting SNF.   Length of Stay: [place], [place]  05/24/2019, 8:04 AM  Advanced Heart Failure Team Pager [phone] (M-F; 7a - 4p)  Please contact [facility] for night-coverage after hours (4p -7a ) and weekends on [email or web address]

## 2019-05-24 NOTE — Plan of Care (Signed)
  Problem: Activity: Goal: Risk for activity intolerance will decrease Outcome: Progressing   Problem: Nutrition: Goal: Adequate nutrition will be maintained Outcome: Progressing   Problem: Coping: Goal: Level of anxiety will decrease Outcome: Progressing   

## 2019-05-24 NOTE — Progress Notes (Signed)
PT Cancellation Note  Patient Details Name: BRIXTON KIRLEY MRN: [id number] DOB: May 19, 1960   Cancelled Treatment:    Reason Eval/Treat Not Completed: Fatigue/lethargy limiting ability to participate. Patient sleeping and not responding to me. Will return later if time allows.    Illiana Losurdo 05/24/2019, 12:32 PM

## 2019-05-24 NOTE — Progress Notes (Signed)
[facility] PROGRESS NOTE    Progress Note  Austin Cobb  [id number] DOB: 1960/11/14 DOA: 05/04/2019 PCP: Patient, No Pcp Per     Brief Narrative:   Austin Cobb is an 59 y.o. male past medical history of anxiety disorder depression alcohol abuse and polysubstance abuse comes in with 1 progressive dyspnea on exertion and fatigue over 2 months.  In the ED she was found to be in atrial flutter with RVR, UDS was negative mildly elevated AST and ALT, BNP greater than 2000 EKG revealed a flutter with LVH, with elevated lactic acid COVID-19 test was negative cardiology was consulted for a flutter with RVR and acute heart failure. CT angio of the chest was negative for PE, 2D echo showed an ejection fraction of 10% with global hypokinesia,  cardioversion on 05/06/2019. Advanced heart failure team was consulted and she was started on IV Lasix.  Assessment/Plan:   Acute systolic heart failure/acute kidney injury/cardiorenal syndrome:: 2D echo on admission showed an EF of 15% with severe right ventricular dysfunction.  Etiology has remained unclear.  But could be tachycardia mediated and longstanding heavy use of alcohol as well as viral cardiomyopathy. Advance heart failure team was consulted she was started on amiodarone drip. Cardiac cath on 05/18/2019 showed no CAD. Right heart cath on 05/18/2019 showed good cardiac output. He was initially on a milrinone drip which has been held, due to the new development of a flutter. He was diuresed and his blood pressure has been erratic. Continue Aldactone, Entresto, digoxin, Coreg and BiDil.   Blood pressure is stable today we will continue current regimen as an outpatient. She he will need a repeat echo in 2 to 3 months. Patient will need skilled nursing facility placement.  Further management per cardiology.  A flutter with RVR: With a CHADS2-VASc > 2.  Now on apixaban. Initially started on amiodarone drip which has now been  transitioned to oral. Due to the concern of HIT she was started on bivalirudin. He had DCCV  atrial flutter again on 05/08/2019. Cardiology attempted second TEE and DCCV again on 3.11.2021, now in [place]. He will be continue on amiodarone 200 mg twice a day for 1 week and then 200 mg daily.  Alcohol withdrawal/acute metabolic encephalopathy/delirium tremens: Drug screen was negative, he has been monitored with CIWA protocol and showed no signs of withdrawal, his ammonia level was elevated, but is now resolved. She was started on Librium for encephalopathy, she attempted to leave AMA on 05/14/2019 as she became agitated. He is having intermittent confusion and agitation, question if at baseline he has a degree of dementia especially in the setting of severe alcohol abuse.  Acute kidney injury: With a baseline creatinine of 1.1. Likely cardiorenal syndrome, he developed new acute kidney injury on 05/22/2019 With new rise in creatinine today likely due to hypotension continue current management repeat basic metabolic panel in the morning.  Thrombocytopenia likely due to HIT: Question due to HIT he has been taken off heparin and transition to bivalirudin, now on Eliquis.  Platelets are recovering. High suspicious for HIT  Elevated troponins: Likely due to demand ischemia cardiac cath showed no obstructive disease.  Elevated LFTs: Likely due to alcohol abuse.  Liver enzymes slowly improving.  Bilateral pleural effusions: Noted.  Polysubstance abuse: Urine drug screen negative, cultures have remained negative he received 2 weeks of fluconazole as on 05/14/2019 his urine culture was positive for yeast.  Sick euthyroid syndrome: Thyroid function test will need to be repeated  6 weeks an outpatient.  Electrolyte imbalance: Hypomagnesemia and hypokalemia: Try to keep magnesium greater than 2 potassium greater than 4.  New hematuria: Currently on Eliquis, hematuria has cleared likely traumatic in  nature.  Discontinue Foley.   DVT prophylaxis: Apixiban Family Communication:none Disposition Plan/Barrier to D/C:  Awaiting SNF placement  Code Status:     Code Status Orders  (From admission, onward)         Start     Ordered   05/04/19 1905  Full code  Continuous     05/04/19 1904        Code Status History    This patient has a current code status but no historical code status.   Advance Care Planning Activity        IV Access:    Peripheral IV   Procedures and diagnostic studies:   No results found.   Medical Consultants:    None.  Anti-Infectives:   None  Subjective:    Austin Cobb has no new complaints.  Objective:    Vitals:   05/22/19 1715 05/22/19 2106 05/23/19 0740 05/24/19 0606  BP: 110/65 (!) 101/53 126/70 115/68  Pulse: 72 (!) 53 (!) 57 (!) 43  Resp:  18 (!) 22 20  Temp: 98.9 F (37.2 C) 98.5 F (36.9 C) 98.2 F (36.8 C) 98 F (36.7 C)  TempSrc: Oral Oral Oral Axillary  SpO2: 98% 98% 98% 98%  Weight:   58.6 kg 56.8 kg  Height:       SpO2: 98 % O2 Flow Rate (L/min): 2 L/min   Intake/Output Summary (Last 24 hours) at 05/24/2019 1005 Last data filed at 05/24/2019 0946 Gross per 24 hour  Intake 360 ml  Output 400 ml  Net -40 ml   Filed Weights   05/22/19 0300 05/23/19 0740 05/24/19 0606  Weight: 56.5 kg 58.6 kg 56.8 kg    Exam: General exam: In no acute distress. Respiratory system: Good air movement and clear to auscultation. Cardiovascular system: S1 & S2 heard, RRR. No JVD. Gastrointestinal system: Abdomen is nondistended, soft and nontender.  Extremities: No pedal edema. Skin: No rashes, lesions or ulcers  Data Reviewed:    Labs: Basic Metabolic Panel: Recent Labs  Lab 05/19/19 0349 05/19/19 0349 05/20/19 0402 05/20/19 0402 05/21/19 0311 05/21/19 0311 05/22/19 0309 05/23/19 0420 05/24/19 0650  NA 139  --  139  --  135  --  135 138  --   K 4.4   < > 4.8   < > 4.4   < > 4.3 4.4  --   CL  102  --  100  --  99  --  99 99  --   CO2 26  --  29  --  25  --  25 30  --   GLUCOSE 92  --  97  --  109*  --  112* 106*  --   BUN 13  --  15  --  13  --  25* 26*  --   CREATININE 1.00  --  1.28*  --  1.09  --  1.54* 1.15  --   CALCIUM 9.1  --  9.4  --  8.7*  --  8.8* 9.3  --   MG 1.7  --  1.8  --  1.5*  --  1.9 1.9  --   PHOS 4.7*   < > 4.9*  --  4.1  --  4.5 3.3 4.7*   < > =  values in this interval not displayed.   GFR Estimated Creatinine Clearance: 55.6 mL/min (by C-G formula based on SCr of 1.15 mg/dL). Liver Function Tests: No results for input(s): AST, ALT, ALKPHOS, BILITOT, PROT, ALBUMIN in the last 168 hours. No results for input(s): LIPASE, AMYLASE in the last 168 hours. Recent Labs  Lab 05/18/19 0327 05/19/19 0349 05/20/19 0402 05/21/19 0311 05/22/19 0309  AMMONIA 15 29 13 19 31    Coagulation profile No results for input(s): INR, PROTIME in the last 168 hours. COVID-19 Labs  Recent Labs    05/22/19 0309 05/23/19 0420 05/24/19 0650  DDIMER 0.53* 0.47 0.70*    Lab Results  Component Value Date   SARSCOV2NAA NEGATIVE 05/05/2019    CBC: Recent Labs  Lab 05/19/19 1549 05/20/19 0402 05/21/19 0311 05/22/19 0309 05/23/19 0420  WBC 6.5 7.1 13.0* 10.0 8.0  NEUTROABS 4.1  --   --   --   --   HGB 12.7* 12.9* 11.2* 10.6* 11.2*  HCT 37.5* 39.7 33.3* 31.4* 34.1*  MCV 91.2 93.2 91.0 91.3 92.4  PLT 130* 143* PLATELET CLUMPS NOTED ON SMEAR, UNABLE TO ESTIMATE 122* 135*   Cardiac Enzymes: No results for input(s): CKTOTAL, CKMB, CKMBINDEX, TROPONINI in the last 168 hours. BNP (last 3 results) No results for input(s): PROBNP in the last 8760 hours. CBG: No results for input(s): GLUCAP in the last 168 hours. D-Dimer: Recent Labs    05/23/19 0420 05/24/19 0650  DDIMER 0.47 0.70*   Hgb A1c: No results for input(s): HGBA1C in the last 72 hours. Lipid Profile: No results for input(s): CHOL, HDL, LDLCALC, TRIG, CHOLHDL, LDLDIRECT in the last 72 hours. Thyroid  function studies: No results for input(s): TSH, T4TOTAL, T3FREE, THYROIDAB in the last 72 hours.  Invalid input(s): FREET3 Anemia work up: No results for input(s): VITAMINB12, FOLATE, FERRITIN, TIBC, IRON, RETICCTPCT in the last 72 hours. Sepsis Labs: Recent Labs  Lab 05/20/19 0402 05/21/19 0311 05/22/19 0309 05/23/19 0420  WBC 7.1 13.0* 10.0 8.0   Microbiology No results found for this or any previous visit (from the past 240 hour(s)).   Medications:   . amiodarone  200 mg Oral BID  . apixaban  5 mg Oral BID  . carvedilol  3.125 mg Oral BID WC  . digoxin  0.125 mg Oral Daily  . feeding supplement (ENSURE ENLIVE)  237 mL Oral 4x daily  . fluconazole  200 mg Oral Daily  . folic acid  1 mg Oral Daily  . haloperidol lactate  1 mg Intravenous TID  . lactulose  20 g Oral BID   Or  . lactulose  300 mL Rectal BID  . multivitamin with minerals  1 tablet Oral Daily  . nicotine  21 mg Transdermal Daily  . sacubitril-valsartan  1 tablet Oral BID  . sodium chloride flush  3 mL Intravenous Q12H  . spironolactone  25 mg Oral Daily  . thiamine  100 mg Oral Daily   Or  . thiamine  100 mg Intravenous Daily   Continuous Infusions:     LOS: 20 days   Charlynne Cousins  [facility]  05/24/2019, 10:05 AM

## 2019-05-25 DIAGNOSIS — D7582 Heparin induced thrombocytopenia (HIT): Secondary | ICD-10-CM

## 2019-05-25 DIAGNOSIS — D75829 Heparin-induced thrombocytopenia, unspecified: Secondary | ICD-10-CM

## 2019-05-25 LAB — D-DIMER, QUANTITATIVE: D-Dimer, Quant: 0.61 ug/mL-FEU — ABNORMAL HIGH (ref 0.00–0.50)

## 2019-05-25 LAB — PHOSPHORUS: Phosphorus: 4.3 mg/dL (ref 2.5–4.6)

## 2019-05-25 MED ORDER — APIXABAN 5 MG PO TABS: 5.0000 mg | ORAL_TABLET | Freq: Two times a day (BID) | ORAL | Status: DC

## 2019-05-25 MED ORDER — AMIODARONE HCL 200 MG PO TABS: 200.0000 mg | ORAL_TABLET | Freq: Two times a day (BID) | ORAL | Status: DC

## 2019-05-25 MED ORDER — DIGOXIN 125 MCG PO TABS: 0.1250 mg | ORAL_TABLET | Freq: Every day | ORAL | Status: DC

## 2019-05-25 MED ORDER — LACTULOSE 10 GM/15ML PO SOLN: 20.0000 g | Freq: Two times a day (BID) | ORAL | 0 refills | Status: DC

## 2019-05-25 MED ORDER — ENSURE ENLIVE PO LIQD: 237.0000 mL | Freq: Four times a day (QID) | ORAL | 12 refills | Status: DC

## 2019-05-25 MED ORDER — CARVEDILOL 3.125 MG PO TABS: 3.1250 mg | ORAL_TABLET | Freq: Two times a day (BID) | ORAL | Status: DC

## 2019-05-25 MED ORDER — ADULT MULTIVITAMIN W/MINERALS CH: 1.0000 | ORAL_TABLET | Freq: Every day | ORAL | Status: DC

## 2019-05-25 MED ORDER — SACUBITRIL-VALSARTAN 24-26 MG PO TABS: 1.0000 | ORAL_TABLET | Freq: Two times a day (BID) | ORAL | Status: DC

## 2019-05-25 MED ORDER — SPIRONOLACTONE 25 MG PO TABS: 25.0000 mg | ORAL_TABLET | Freq: Every day | ORAL | Status: DC

## 2019-05-25 NOTE — TOC Progression Note (Signed)
Transition of Care [facility]) - Progression Note    Patient Details  Name: Austin Cobb MRN: [id number] Date of Birth: 28-May-1960  Transition of Care [facility]) CM/SW [place], [place] Phone Number: 05/25/2019, 10:11 AM  Clinical Narrative:    CSW spoke with PAS[facility] concerning pt's  PASRR review.  CSW was informed that pt was under [facility] evaluation which could take 5 business days however we are on third day for review.  TOC team will continue to follow for disposition.   Expected Discharge Plan: Skilled Nursing Facility Barriers to Discharge: Active Substance Use - Placement, Continued Medical Work up, Ship broker, SNF Pending bed offer  Expected Discharge Plan and Services Expected Discharge Plan: [place] In-house Referral: Clinical Social Work     Living arrangements for the past 2 months: Mobile Home Expected Discharge Date: 05/25/19                                     Social Determinants of Health (SDOH) Interventions    Readmission Risk Interventions No flowsheet data found.

## 2019-05-25 NOTE — Care Management (Signed)
[id number] 05-25-19 Eliquis was previously checked :   Patient Details  Name: Austin Cobb MRN: [id number] Date of Birth: December 29, 1960   Medication/Dose: apixaban (Eliquis) 5 mg twice a day  Covered?: Yes  Tier: 3 Drug  Prescription Coverage Preferred Pharmacy: [facility] or any [employer] with Person/Company/Phone Number:: [facility]  [place] [phone]  Co-Pay: 312.00 for 30 day supply  Prior Approval: No  Deductible: Unmet(265.00)  Additional Notes: Check on Xarelto and it is the same amount for a 30 day supply

## 2019-05-25 NOTE — Discharge Summary (Signed)
Physician Discharge Summary  Austin Cobb [id number] DOB: October 21, 1960 DOA: 05/04/2019  PCP: Patient, No Pcp Per  Admit date: 05/04/2019 Discharge date: 05/25/2019  Admitted From: Home Disposition:  SNF  Recommendations for Outpatient Follow-up:  1. Follow up with heart failure clinic in 1-2 weeks 2. Please obtain BMP/CBC in one week. 3. Recheck TSH and free T4 in 4 to 6 weeks. 4. He will need to be on amiodarone 200 mg p.o. twice daily for 5 days then switch to daily  Home Health:No Equipment/Devices:None  Discharge Condition:Stable CODE STATUS:Full Diet recommendation: Heart Healthy   Brief/Interim Summary: 59 y.o. male past medical history of anxiety disorder depression alcohol abuse and polysubstance abuse comes in with 1 progressive dyspnea on exertion and fatigue over 2 months.  In the ED she was found to be in atrial flutter with RVR, UDS was negative mildly elevated AST and ALT, BNP greater than 2000 EKG revealed a flutter with LVH, with elevated lactic acid COVID-19 test was negative cardiology was consulted for a flutter with RVR and acute heart failure. CT angio of the chest was negative for PE, 2D echo showed an ejection fraction of 10% with global hypokinesia,  cardioversion on 05/06/2019  Discharge Diagnoses:  Active Problems:   Atrial flutter with rapid ventricular response (HCC)   Acute heart failure (HCC)   Essential hypertension   Acute on chronic combined systolic and diastolic CHF (congestive heart failure) (HCC)   Alcohol withdrawal (HCC)   Acute metabolic encephalopathy   AKI (acute kidney injury) ([place])   Elevated d-dimer   Demand ischemia (HCC)   Elevated troponin   Pleural effusion   Polysubstance abuse (HCC)   E. coli UTI   Agitation   Noncompliance   Yeast UTI   Protein-calorie malnutrition, severe   Heparin induced thrombocytopenia (HCC)  Acute systolic heart failure/acute kidney injury/cardiorenal syndrome: She was started on IV Lasix,  with poor diuresis. On admission a 2D echo showed an EF of 50% with severe right ventricular dysfunction, etiology remains unclear but could be tachycardia mediated versus longstanding heavy alcohol use. Advanced heart failure team was consulted who started the patient on milrinone drip and IV Lasix in 2021, cardiac cath on 05/18/2019 showed no CAD, right heart cath on 05/18/2019 showed good cardiac output. He was initially started on IV milrinone drip this was held due to the new development of atrial flutter, he was diuresed but his blood pressure became erratic. As he stabilized he was Aldactone, Entresto digoxin Coreg and BiDil. His blood pressure has stabilized and remains controlled today. He will need a 2D echo in 2 to 3 months. Physical therapy evaluated the patient and recommended skilled nursing facility.  Atrial flutter with RVR: Initially this was the reason for admission, his chads Vascor was greater than 2 he was started on IV heparin. IV metoprolol was started with difficult to control heart rate, the heart failure team was consulted, he was switched to amiodarone drip which is now been transitioned to oral. As he was on IV heparin there was a high suspicions for HIT as his platelets significantly decreased, he was switched to IV bivalirudin which he tolerated. DC CV was attempted but when he was started on milrinone he went back into a flutter cardioversion was attempted again on 05/08/2019 while on amiodarone and now has remained in sinus rhythm. He has been switched to amiodarone 200 mg p.o. twice daily for 1 week then will switch to 200 mg daily.  Alcohol withdrawal/acute metabolic encephalopathy/delirium  tremens: UDS was negative he was monitored with CIWA protocol with no signs of withdrawal his ammonia level was elevated so he was started on lactulose which is now resolved. He was been having intermittent confusion and agitation throughout his hospital stay question he has a degree  of dementia. Now has stabilized with no further agitations.  Acute kidney injury: With a baseline creatinine of 1.1 likely cardiorenal syndrome see above for further details.  Thrombocytopenia likely due to HIT: In the setting of heparin heparin was stopped and switch to bivalirudin serotonin assay was positive, his platelets improved he was transitioned to oral Eliquis which continue as an outpatient  Elevated troponins: Likely demand ischemia cardiac cath showed no obstructive disease.  Elevated LFTs: Likely due to alcohol abuse LFTs after his long hospital stay has been improving as he has been abstinent.  Bilateral pleural effusions: Noted.  Polysubstance abuse: UDS was negative.  Acute thyroid syndrome: We will need to have TSH and free T4 check in 4 weeks.  Electrolyte imbalance: They were corrected we try to keep the potassium greater than 4 magnesium greater than 2.  Hematuria while on Eliquis, Likely traumatic now resolved we have discontinue the Foley.  Discharge Instructions  Discharge Instructions    Diet - low sodium heart healthy   Complete by: As directed    Increase activity slowly   Complete by: As directed      Allergies as of 05/25/2019      Reactions   Heparin    HIT ab +, SRA  negative      Medication List    STOP taking these medications   valACYclovir 1000 MG tablet Commonly known as: VALTREX     TAKE these medications   amiodarone 200 MG tablet Commonly known as: PACERONE Take 1 tablet (200 mg total) by mouth 2 (two) times daily.   apixaban 5 MG Tabs tablet Commonly known as: ELIQUIS Take 1 tablet (5 mg total) by mouth 2 (two) times daily.   carvedilol 3.125 MG tablet Commonly known as: COREG Take 1 tablet (3.125 mg total) by mouth 2 (two) times daily with a meal.   digoxin 0.125 MG tablet Commonly known as: LANOXIN Take 1 tablet (0.125 mg total) by mouth daily.   feeding supplement (ENSURE ENLIVE) Liqd Take 237 mLs by mouth  in the morning, at noon, in the evening, and at bedtime.   lactulose 10 GM/15ML solution Commonly known as: CHRONULAC Take 30 mLs (20 g total) by mouth 2 (two) times daily.   multivitamin with minerals Tabs tablet Take 1 tablet by mouth daily.   sacubitril-valsartan 24-26 MG Commonly known as: ENTRESTO Take 1 tablet by mouth 2 (two) times daily.   spironolactone 25 MG tablet Commonly known as: ALDACTONE Take 1 tablet (25 mg total) by mouth daily.       Contact information for follow-up providers    [facility] on 06/01/2019.   Specialty: Cardiology Why: 3:30 PM, [facility], Entrance C, parking code [place] information: [address] [id number] Danice Goltz [place] [phone]           Contact information for after-discharge care    Destination    [facility] Preferred SNF .   Service: Skilled Nursing Contact information: [address] [place] [address] [phone]                 Allergies  Allergen Reactions  .  Heparin     HIT ab +, SRA  negative    Consultations:  Advanced heart failure team.   Procedures/Studies: CT ANGIO CHEST PE W OR WO CONTRAST  Result Date: 05/05/2019 CLINICAL DATA:  Increasing shortness of breath for 1 day. Chest pain. EXAM: CT ANGIOGRAPHY CHEST WITH CONTRAST TECHNIQUE: Multidetector CT imaging of the chest was performed using the standard protocol during bolus administration of intravenous contrast. Multiplanar CT image reconstructions and MIPs were obtained to evaluate the vascular anatomy. CONTRAST:  80 mL OMNIPAQUE IOHEXOL 350 MG/ML SOLN COMPARISON:  Single-view of the chest 05/04/2019. CT chest 05/12/2016. FINDINGS: Cardiovascular: Distal segmental branches are not opacified. No pulmonary embolus is identified. There is marked cardiomegaly. No pericardial effusion. Scattered aortic atherosclerotic  calcifications are present. Mediastinum/Nodes: No enlarged mediastinal, hilar, or axillary lymph nodes. Thyroid gland, trachea, and esophagus demonstrate no significant findings. Lungs/Pleura: Moderate bilateral pleural effusions are seen, larger on the right. Scattered mild ground-glass attenuation is likely due to atelectasis. No consolidative process, nodule, mass or pneumothorax. Upper Abdomen: Negative. Musculoskeletal: No acute or focal abnormality. Review of the MIP images confirms the above findings. IMPRESSION: Negative for pulmonary embolus. As noted above, the distal segmental branches are not opacified with contrast. Marked cardiomegaly. Moderate bilateral pleural effusions, larger on the right. Aortic Atherosclerosis (ICD10-I70.0). Electronically Signed   By: Inge Rise M.D.   On: 05/05/2019 13:46   CARDIAC CATHETERIZATION  Result Date: 05/18/2019 1. Low filling pressures. 2. Preserved cardiac output. 3. Nonobstructive mild coronary disease. Nonischemic cardiomyopathy.  Will start Eliquis this evening.   DG CHEST PORT 1 VIEW  Result Date: 05/14/2019 CLINICAL DATA:  Shortness of breath. EXAM: PORTABLE CHEST 1 VIEW COMPARISON:  Chest radiograph 05/04/2019 and chest CTA 05/05/2019 FINDINGS: A right PICC has been placed and terminates over the lower SVC. The cardiac silhouette remains moderately enlarged. There is improved aeration of the lungs with slight residual prominence of the interstitial markings. No overt pulmonary edema, airspace consolidation or pneumothorax is identified. No sizable residual pleural effusions are evident. No acute osseous abnormality is seen. IMPRESSION: Cardiomegaly with improved aeration of the lungs and resolved pleural effusions. Electronically Signed   By: Logan Bores M.D.   On: 05/14/2019 08:11   DG Chest Portable 1 View  Result Date: 05/04/2019 CLINICAL DATA:  Shortness of breath EXAM: PORTABLE CHEST 1 VIEW COMPARISON:  August 09, 2017 FINDINGS: There is  moderate cardiomegaly, increased from the prior exam. Mildly hazy airspace opacity seen at the right lung base with a probable small right pleural effusion. No acute osseous abnormality. IMPRESSION: Moderate cardiomegaly, increased from the prior exam, and could be combination of cardiomegaly and/or pericardial effusion. Probable small right pleural effusion and hazy airspace opacity at the right lung base which could be layering effusion/atelectasis. Electronically Signed   By: Prudencio Pair M.D.   On: 05/04/2019 16:01   ECHOCARDIOGRAM COMPLETE  Result Date: 05/05/2019    ECHOCARDIOGRAM REPORT   Patient Name:   Austin Cobb Date of Exam: 05/05/2019 Medical Rec #:  [id number]         Height:       69.0 in Accession #:    [id number]        Weight:       134.5 lb Date of Birth:  [birth year]          BSA:          1.745 m Patient Age:    74 years  BP:           150/103 mmHg Patient Gender: M                 HR:           128 bpm. Exam Location:  Inpatient Procedure: 2D Echo, Cardiac Doppler and Color Doppler Indications:    I50.40* Unspecified combined systolic (congestive) and diastolic                 (congestive) heart failure  History:        Patient has no prior history of Echocardiogram examinations.                 Abnormal ECG, Arrythmias:Atrial Flutter, Signs/Symptoms:Dyspnea;                 Risk Factors:Current Smoker and Hypertension. ETOH. Poly                 substance abuse.  Sonographer:    Roseanna Rainbow RDCS Referring Phys: [place]  1. LVOT VTI 7 cm. CO ~3.5 L/min. CI ~2.0 L/min/m2. Left ventricular ejection fraction, by estimation, is 10-15%. The left ventricle has severely decreased function. The left ventricle demonstrates global hypokinesis. Left ventricular diastolic function could not be evaluated. Left ventricular diastolic function could not be evaluated.  2. Right ventricular systolic function is severely reduced. The right ventricular size is severely  enlarged. There is normal pulmonary artery systolic pressure. The estimated right ventricular systolic pressure is Q000111Q mmHg.  3. Left atrial size was mildly dilated.  4. Right atrial size was mild to moderately dilated.  5. The mitral valve is grossly normal. Mild to moderate mitral valve regurgitation.  6. The aortic valve is tricuspid. Aortic valve regurgitation is not visualized. No aortic stenosis is present.  7. The inferior vena cava is dilated in size with <50% respiratory variability, suggesting right atrial pressure of 15 mmHg. FINDINGS  Left Ventricle: LVOT VTI 7 cm. CO ~3.5 L/min. CI ~2.0 L/min/m2. Left ventricular ejection fraction, by estimation, is 10-15%. The left ventricle has severely decreased function. The left ventricle demonstrates global hypokinesis. The left ventricular internal cavity size was normal in size. There is no left ventricular hypertrophy. Left ventricular diastolic function could not be evaluated due to atrial fibrillation. Left ventricular diastolic function could not be evaluated. Right Ventricle: The right ventricular size is severely enlarged. No increase in right ventricular wall thickness. Right ventricular systolic function is severely reduced. There is normal pulmonary artery systolic pressure. The tricuspid regurgitant velocity is 1.82 m/s, and with an assumed right atrial pressure of 15 mmHg, the estimated right ventricular systolic pressure is Q000111Q mmHg. Left Atrium: Left atrial size was mildly dilated. Right Atrium: Right atrial size was mild to moderately dilated. Pericardium: A small pericardial effusion is present. The pericardial effusion is posterior to the left ventricle and the left atrium. Mitral Valve: The mitral valve is grossly normal. Mild to moderate mitral valve regurgitation. Tricuspid Valve: The tricuspid valve is grossly normal. Tricuspid valve regurgitation is mild. Aortic Valve: The aortic valve is tricuspid. Aortic valve regurgitation is not  visualized. No aortic stenosis is present. Pulmonic Valve: The pulmonic valve was grossly normal. Pulmonic valve regurgitation is not visualized. Aorta: The aortic root and ascending aorta are structurally normal, with no evidence of dilitation. Venous: The inferior vena cava is dilated in size with less than 50% respiratory variability, suggesting right atrial pressure of 15 mmHg. IAS/Shunts: No atrial level  shunt detected by color flow Doppler.  LEFT VENTRICLE PLAX 2D LVIDd:         4.60 cm     Diastology LVIDs:         4.20 cm     LV e' lateral:   2.12 cm/s LV PW:         1.70 cm     LV E/e' lateral: 28.7 LV IVS:        1.10 cm     LV e' medial:    2.12 cm/s LVOT diam:     2.20 cm     LV E/e' medial:  28.7 LV SV:         27 LV SV Index:   15 LVOT Area:     3.80 cm  LV Volumes (MOD) LV vol d, MOD A2C: 63.8 ml LV vol d, MOD A4C: 80.9 ml LV vol s, MOD A2C: 57.9 ml LV vol s, MOD A4C: 64.5 ml LV SV MOD A2C:     5.9 ml LV SV MOD A4C:     80.9 ml LV SV MOD BP:      12.7 ml RIGHT VENTRICLE            IVC RV S prime:     4.37 cm/s  IVC diam: 3.20 cm TAPSE (M-mode): 0.9 cm LEFT ATRIUM             Index LA diam:        3.70 cm 2.12 cm/m LA Vol (A2C):   51.4 ml 29.45 ml/m LA Vol (A4C):   68.1 ml 39.02 ml/m LA Biplane Vol: 59.3 ml 33.98 ml/m  AORTIC VALVE LVOT Vmax:   49.90 cm/s LVOT Vmean:  35.000 cm/s LVOT VTI:    0.070 m  AORTA Ao Root diam: 3.50 cm Ao Asc diam:  3.20 cm MITRAL VALVE                 TRICUSPID VALVE MV Area (PHT): 6.24 cm      TR Peak grad:   13.2 mmHg MV Decel Time: 122 msec      TR Vmax:        182.00 cm/s MR Peak grad:    88.0 mmHg MR Mean grad:    48.0 mmHg   SHUNTS MR Vmax:         469.00 cm/s Systemic VTI:  0.07 m MR Vmean:        306.0 cm/s  Systemic Diam: 2.20 cm MR PISA:         0.57 cm MR PISA Eff ROA: 6 mm MR PISA Radius:  0.30 cm MV E velocity: 60.90 cm/s Eleonore Chiquito MD Electronically signed by Eleonore Chiquito MD Signature Date/Time: 05/05/2019/6:15:39 PM    Final    ECHO TEE  Result  Date: 05/06/2019    TRANSESOPHOGEAL ECHO REPORT   Patient Name:   Austin Cobb Date of Exam: 05/06/2019 Medical Rec #:  [id number]         Height:       69.0 in Accession #:    [id number]        Weight:       134.5 lb Date of Birth:  Jun 23, 1960          BSA:          1.745 m Patient Age:    13 years          BP:           108/81 mmHg  Patient Gender: M                 HR:           120 bpm. Exam Location:  Inpatient Procedure: Transesophageal Echo and Color Doppler Indications:     Atrial fibrillation  History:         Patient has prior history of Echocardiogram examinations, most                  recent 05/05/2019. CHF; Risk Factors:Tobacco abuse and                  Hypertension. Alcohol abuse.  Sonographer:     Vikki Ports T[place] Referring Phys:  [id number] Abigail Butts Diagnosing Phys: [name] MD PROCEDURE: The transesophogeal probe was passed without difficulty through the esophogus of the patient. Sedation performed by different physician. The patient was monitored while under deep sedation. Anesthestetic sedation was provided intravenously by Anesthesiology: 111mg  of Propofol. The patient developed no complications during the procedure. IMPRESSIONS  1. Left ventricular ejection fraction, by estimation, is <20%. The left ventricle has severely decreased function. The left ventricle demonstrates global hypokinesis. The left ventricular internal cavity size was mildly to moderately dilated.  2. Right ventricular systolic function is normal. The right ventricular size is normal.  3. No left atrial/left atrial appendage thrombus was detected.  4. The mitral valve is normal in structure and function. Mild to moderate mitral valve regurgitation.  5. The aortic valve is normal in structure and function. Aortic valve regurgitation is trivial. FINDINGS  Left Ventricle: Left ventricular ejection fraction, by estimation, is <20%. The left ventricle has severely decreased function. The left ventricle demonstrates  global hypokinesis. The left ventricular internal cavity size was mildly to moderately dilated. There is no left ventricular hypertrophy. Right Ventricle: The right ventricular size is normal. No increase in right ventricular wall thickness. Right ventricular systolic function is normal. Left Atrium: Left atrial size was normal in size. No left atrial/left atrial appendage thrombus was detected. Right Atrium: Right atrial size was normal in size. Pericardium: There is no evidence of pericardial effusion. Mitral Valve: The mitral valve is normal in structure and function. Mild to moderate mitral valve regurgitation. Tricuspid Valve: The tricuspid valve is normal in structure. Tricuspid valve regurgitation is trivial. Aortic Valve: The aortic valve is normal in structure and function. Aortic valve regurgitation is trivial. Pulmonic Valve: The pulmonic valve was grossly normal. Pulmonic valve regurgitation is mild. Aorta: The aortic root, ascending aorta, aortic arch and descending aorta are all structurally normal, with no evidence of dilitation or obstruction. IAS/Shunts: The atrial septum is grossly normal. [name] MD Electronically signed by [name] MD Signature Date/Time: 05/06/2019/6:39:54 PM    Final    VAS [place] LOWER EXTREMITY VENOUS (DVT)  Result Date: 05/06/2019  Lower Venous DVTStudy Indications: Elevated Ddimer.  Risk Factors: None identified. Comparison Study: No prior studies. Performing Technologist: Oliver Hum RVT  Examination Guidelines: A complete evaluation includes B-mode imaging, spectral Doppler, color Doppler, and power Doppler as needed of all accessible portions of each vessel. Bilateral testing is considered an integral part of a complete examination. Limited examinations for reoccurring indications may be performed as noted. The reflux portion of the exam is performed with the patient in reverse Trendelenburg.   +---------+---------------+---------+-----------+----------+--------------+ RIGHT    CompressibilityPhasicitySpontaneityPropertiesThrombus Aging +---------+---------------+---------+-----------+----------+--------------+ CFV      Full           Yes  Yes                                 +---------+---------------+---------+-----------+----------+--------------+ SFJ      Full                                                        +---------+---------------+---------+-----------+----------+--------------+ FV Prox  Full                                                        +---------+---------------+---------+-----------+----------+--------------+ FV Mid   Full                                                        +---------+---------------+---------+-----------+----------+--------------+ FV DistalFull                                                        +---------+---------------+---------+-----------+----------+--------------+ PFV      Full                                                        +---------+---------------+---------+-----------+----------+--------------+ POP      Full           Yes      Yes                                 +---------+---------------+---------+-----------+----------+--------------+ PTV      Full                                                        +---------+---------------+---------+-----------+----------+--------------+ PERO     Full                                                        +---------+---------------+---------+-----------+----------+--------------+   +---------+---------------+---------+-----------+----------+--------------+ LEFT     CompressibilityPhasicitySpontaneityPropertiesThrombus Aging +---------+---------------+---------+-----------+----------+--------------+ CFV      Full           Yes      Yes                                  +---------+---------------+---------+-----------+----------+--------------+ SFJ      Full                                                        +---------+---------------+---------+-----------+----------+--------------+  FV Prox  Full                                                        +---------+---------------+---------+-----------+----------+--------------+ FV Mid   Full                                                        +---------+---------------+---------+-----------+----------+--------------+ FV DistalFull                                                        +---------+---------------+---------+-----------+----------+--------------+ PFV      Full                                                        +---------+---------------+---------+-----------+----------+--------------+ POP      Full           Yes      Yes                                 +---------+---------------+---------+-----------+----------+--------------+ PTV      Full                                                        +---------+---------------+---------+-----------+----------+--------------+ PERO     Full                                                        +---------+---------------+---------+-----------+----------+--------------+     Summary: RIGHT: - There is no evidence of deep vein thrombosis in the lower extremity.  - No cystic structure found in the popliteal fossa.  LEFT: - There is no evidence of deep vein thrombosis in the lower extremity.  - No cystic structure found in the popliteal fossa.  *See table(s) above for measurements and observations. Electronically signed by Monica Martinez MD on 05/06/2019 at 2:01:55 PM.    Final    [place] EKG SITE RITE  Result Date: 05/07/2019 If [facility] image not attached, placement could not be confirmed due to current cardiac rhythm.  [place] EKG SITE RITE  Result Date: 05/06/2019 If [facility] image not attached, placement could  not be confirmed due to current cardiac rhythm.   Subjective: No complaints.  Discharge Exam: Vitals:   05/24/19 2034 05/25/19 0507  BP: 126/63 124/67  Pulse: 60 (!) 57  Resp:    Temp: 98.6 F (37 C) 97.8 F (36.6 C)  SpO2: 99% 98%   Vitals:  05/23/19 0740 05/24/19 0606 05/24/19 2034 05/25/19 0507  BP: 126/70 115/68 126/63 124/67  Pulse: (!) 57 (!) 43 60 (!) 57  Resp: (!) 22 20    Temp: 98.2 F (36.8 C) 98 F (36.7 C) 98.6 F (37 C) 97.8 F (36.6 C)  TempSrc: Oral Axillary Oral Oral  SpO2: 98% 98% 99% 98%  Weight: 58.6 kg 56.8 kg  56 kg  Height:        General: Pt is alert, awake, not in acute distress Cardiovascular: RRR, S1/S2 +, no rubs, no gallops Respiratory: CTA bilaterally, no wheezing, no rhonchi Abdominal: Soft, NT, ND, bowel sounds + Extremities: no edema, no cyanosis    The results of significant diagnostics from this hospitalization (including imaging, microbiology, ancillary and laboratory) are listed below for reference.     Microbiology: Recent Results (from the past 240 hour(s))  SARS CORONAVIRUS 2 (TAT 6-24 HRS) Nasopharyngeal Nasopharyngeal Swab     Status: None   Collection Time: 05/24/19  9:35 AM   Specimen: Nasopharyngeal Swab  Result Value Ref Range Status   SARS Coronavirus 2 NEGATIVE NEGATIVE Final    Comment: (NOTE) SARS-CoV-2 target nucleic acids are NOT DETECTED. The SARS-CoV-2 RNA is generally detectable in upper and lower respiratory specimens during the acute phase of infection. Negative results do not preclude SARS-CoV-2 infection, do not rule out co-infections with other pathogens, and should not be used as the sole basis for treatment or other patient management decisions. Negative results must be combined with clinical observations, patient history, and epidemiological information. The expected result is Negative. Fact Sheet for Patients: SugarRoll.be Fact Sheet for Healthcare  Providers: https://www.woods-mathews.com/ This test is not yet approved or cleared by the [place] FDA and  has been authorized for detection and/or diagnosis of SARS-CoV-2 by FDA under an Emergency Use Authorization (EUA). This EUA will remain  in effect (meaning this test can be used) for the duration of the COVID-19 declaration under Section 56 4(b)(1) of the Act, 21 U.S.C. section 360bbb-3(b)(1), unless the authorization is terminated or revoked sooner. Performed at [facility], [place] [address], [place], [place] [address]      Labs: BNP (last 3 results) Recent Labs    05/04/19 1540  BNP [id number]*   Basic Metabolic Panel: Recent Labs  Lab 05/19/19 0349 05/19/19 0349 05/20/19 0402 05/20/19 0402 05/21/19 0311 05/22/19 0309 05/23/19 0420 05/24/19 0650 05/25/19 0258  NA 139  --  139  --  135 135 138  --   --   K 4.4  --  4.8  --  4.4 4.3 4.4  --   --   CL 102  --  100  --  99 99 99  --   --   CO2 26  --  29  --  25 25 30   --   --   GLUCOSE 92  --  97  --  109* 112* 106*  --   --   BUN 13  --  15  --  13 25* 26*  --   --   CREATININE 1.00  --  1.28*  --  1.09 1.54* 1.15  --   --   CALCIUM 9.1  --  9.4  --  8.7* 8.8* 9.3  --   --   MG 1.7  --  1.8  --  1.5* 1.9 1.9  --   --   PHOS 4.7*   < > 4.9*   < > 4.1 4.5 3.3 4.7*  4.3   < > = values in this interval not displayed.   Liver Function Tests: No results for input(s): AST, ALT, ALKPHOS, BILITOT, PROT, ALBUMIN in the last 168 hours. No results for input(s): LIPASE, AMYLASE in the last 168 hours. Recent Labs  Lab 05/19/19 0349 05/20/19 0402 05/21/19 0311 05/22/19 0309  AMMONIA 29 13 19 31    CBC: Recent Labs  Lab 05/19/19 1549 05/20/19 0402 05/21/19 0311 05/22/19 0309 05/23/19 0420  WBC 6.5 7.1 13.0* 10.0 8.0  NEUTROABS 4.1  --   --   --   --   HGB 12.7* 12.9* 11.2* 10.6* 11.2*  HCT 37.5* 39.7 33.3* 31.4* 34.1*  MCV 91.2 93.2 91.0 91.3 92.4  PLT 130* 143* PLATELET CLUMPS NOTED ON  SMEAR, UNABLE TO ESTIMATE 122* 135*   Cardiac Enzymes: No results for input(s): CKTOTAL, CKMB, CKMBINDEX, TROPONINI in the last 168 hours. BNP: Invalid input(s): POCBNP CBG: No results for input(s): GLUCAP in the last 168 hours. D-Dimer Recent Labs    05/24/19 0650 05/25/19 0258  DDIMER 0.70* 0.61*   Hgb A1c No results for input(s): HGBA1C in the last 72 hours. Lipid Profile No results for input(s): CHOL, HDL, LDLCALC, TRIG, CHOLHDL, LDLDIRECT in the last 72 hours. Thyroid function studies No results for input(s): TSH, T4TOTAL, T3FREE, THYROIDAB in the last 72 hours.  Invalid input(s): FREET3 Anemia work up No results for input(s): VITAMINB12, FOLATE, FERRITIN, TIBC, IRON, RETICCTPCT in the last 72 hours. Urinalysis    Component Value Date/Time   COLORURINE AMBER (A) 05/04/2019 1600   APPEARANCEUR HAZY (A) 05/04/2019 1600   LABSPEC 1.024 05/04/2019 1600   PHURINE 5.0 05/04/2019 1600   GLUCOSEU NEGATIVE 05/04/2019 1600   HGBUR NEGATIVE 05/04/2019 1600   BILIRUBINUR NEGATIVE 05/04/2019 1600   KETONESUR NEGATIVE 05/04/2019 1600   PROTEINUR 100 (A) 05/04/2019 1600   NITRITE NEGATIVE 05/04/2019 1600   LEUKOCYTESUR LARGE (A) 05/04/2019 1600   Sepsis Labs Invalid input(s): PROCALCITONIN,  WBC,  LACTICIDVEN Microbiology Recent Results (from the past 240 hour(s))  SARS CORONAVIRUS 2 (TAT 6-24 HRS) Nasopharyngeal Nasopharyngeal Swab     Status: None   Collection Time: 05/24/19  9:35 AM   Specimen: Nasopharyngeal Swab  Result Value Ref Range Status   SARS Coronavirus 2 NEGATIVE NEGATIVE Final    Comment: (NOTE) SARS-CoV-2 target nucleic acids are NOT DETECTED. The SARS-CoV-2 RNA is generally detectable in upper and lower respiratory specimens during the acute phase of infection. Negative results do not preclude SARS-CoV-2 infection, do not rule out co-infections with other pathogens, and should not be used as the sole basis for treatment or other patient management  decisions. Negative results must be combined with clinical observations, patient history, and epidemiological information. The expected result is Negative. Fact Sheet for Patients: SugarRoll.be Fact Sheet for Healthcare Providers: https://www.woods-mathews.com/ This test is not yet approved or cleared by the [place] FDA and  has been authorized for detection and/or diagnosis of SARS-CoV-2 by FDA under an Emergency Use Authorization (EUA). This EUA will remain  in effect (meaning this test can be used) for the duration of the COVID-19 declaration under Section 56 4(b)(1) of the Act, 21 U.S.C. section 360bbb-3(b)(1), unless the authorization is terminated or revoked sooner. Performed at [facility], [place] [address], [place], [place] [address]      Time coordinating discharge: Over 40 minutes  SIGNED:   Charlynne Cousins, MD  [facility] 05/25/2019, 8:11 AM Pager   If 7PM-7AM, please contact night-coverage [email or web address] Password  [email or web address]

## 2019-05-26 DIAGNOSIS — Z7401 Bed confinement status: Secondary | ICD-10-CM | POA: Diagnosis not present

## 2019-05-26 DIAGNOSIS — G9341 Metabolic encephalopathy: Secondary | ICD-10-CM | POA: Diagnosis not present

## 2019-05-26 DIAGNOSIS — I10 Essential (primary) hypertension: Secondary | ICD-10-CM | POA: Diagnosis not present

## 2019-05-26 DIAGNOSIS — F10929 Alcohol use, unspecified with intoxication, unspecified: Secondary | ICD-10-CM | POA: Diagnosis not present

## 2019-05-26 DIAGNOSIS — N179 Acute kidney failure, unspecified: Secondary | ICD-10-CM | POA: Diagnosis not present

## 2019-05-26 DIAGNOSIS — I4891 Unspecified atrial fibrillation: Secondary | ICD-10-CM | POA: Diagnosis not present

## 2019-05-26 DIAGNOSIS — R778 Other specified abnormalities of plasma proteins: Secondary | ICD-10-CM | POA: Diagnosis not present

## 2019-05-26 DIAGNOSIS — M2550 Pain in unspecified joint: Secondary | ICD-10-CM | POA: Diagnosis not present

## 2019-05-26 DIAGNOSIS — F419 Anxiety disorder, unspecified: Secondary | ICD-10-CM | POA: Diagnosis not present

## 2019-05-26 DIAGNOSIS — D696 Thrombocytopenia, unspecified: Secondary | ICD-10-CM | POA: Diagnosis not present

## 2019-05-26 DIAGNOSIS — F19939 Other psychoactive substance use, unspecified with withdrawal, unspecified: Secondary | ICD-10-CM | POA: Diagnosis not present

## 2019-05-26 DIAGNOSIS — F329 Major depressive disorder, single episode, unspecified: Secondary | ICD-10-CM | POA: Diagnosis not present

## 2019-05-26 DIAGNOSIS — I5021 Acute systolic (congestive) heart failure: Secondary | ICD-10-CM | POA: Diagnosis not present

## 2019-05-26 DIAGNOSIS — I5043 Acute on chronic combined systolic (congestive) and diastolic (congestive) heart failure: Secondary | ICD-10-CM | POA: Diagnosis not present

## 2019-05-26 DIAGNOSIS — F1010 Alcohol abuse, uncomplicated: Secondary | ICD-10-CM | POA: Diagnosis not present

## 2019-05-26 DIAGNOSIS — I4892 Unspecified atrial flutter: Secondary | ICD-10-CM | POA: Diagnosis not present

## 2019-05-26 DIAGNOSIS — I130 Hypertensive heart and chronic kidney disease with heart failure and stage 1 through stage 4 chronic kidney disease, or unspecified chronic kidney disease: Secondary | ICD-10-CM | POA: Diagnosis not present

## 2019-05-26 DIAGNOSIS — D7582 Heparin induced thrombocytopenia (HIT): Secondary | ICD-10-CM | POA: Diagnosis not present

## 2019-05-26 LAB — BASIC METABOLIC PANEL
Anion gap: 12 (ref 5–15)
BUN: 30 mg/dL — ABNORMAL HIGH (ref 6–20)
CO2: 24 mmol/L (ref 22–32)
Calcium: 9.6 mg/dL (ref 8.9–10.3)
Chloride: 98 mmol/L (ref 98–111)
Creatinine, Ser: 1.08 mg/dL (ref 0.61–1.24)
GFR calc Af Amer: >60 mL/min (ref >60)
GFR calc non Af Amer: >60 mL/min (ref >60)
Glucose, Bld: 87 mg/dL (ref 70–99)
Potassium: 5.4 mmol/L — ABNORMAL HIGH (ref 3.5–5.1)
Sodium: 134 mmol/L — ABNORMAL LOW (ref 135–145)

## 2019-05-26 LAB — PHOSPHORUS: Phosphorus: 5.4 mg/dL — ABNORMAL HIGH (ref 2.5–4.6)

## 2019-05-26 LAB — D-DIMER, QUANTITATIVE: D-Dimer, Quant: 0.64 ug/mL-FEU — ABNORMAL HIGH (ref 0.00–0.50)

## 2019-05-26 MED ORDER — SPIRONOLACTONE 12.5 MG HALF TABLET: 12.5000 mg | ORAL_TABLET | Freq: Every day | ORAL | Status: DC

## 2019-05-26 MED ORDER — SPIRONOLACTONE 25 MG PO TABS: 12.5000 mg | ORAL_TABLET | Freq: Every day | ORAL | Status: DC

## 2019-05-26 NOTE — TOC Progression Note (Signed)
Transition of Care [facility]) - Progression Note    Patient Details  Name: Austin Cobb MRN: [id number] Date of Birth: 09/17/1960  Transition of Care [facility]) CM/SW [place], [place] Phone Number: 05/26/2019, 10:17 AM  Clinical Narrative:     CSW has received PASRR # for pt and updated SNF and physician  on updating discharge summary for disposition.  TOC Team will continue to follow for disposition.  Expected Discharge Plan: Skilled Nursing Facility Barriers to Discharge: Active Substance Use - Placement, Continued Medical Work up, Ship broker, SNF Pending bed offer  Expected Discharge Plan and Services Expected Discharge Plan: [place] In-house Referral: Clinical Social Work     Living arrangements for the past 2 months: Mobile Home Expected Discharge Date: 05/25/19                                     Social Determinants of Health (SDOH) Interventions    Readmission Risk Interventions No flowsheet data found.

## 2019-05-26 NOTE — Progress Notes (Signed)
Patient ID: Austin Cobb, male   DOB: 12/02/1960, 59 y.o.   MRN: [id number]  Plan for discharge today noted.  K is high at 5.4, would cut back on spironolactone to 12.5 mg daily for now.  Should get BMET on Friday or Monday to follow.  We will see in CHF clinic.   Loralie Champagne 05/26/2019

## 2019-05-26 NOTE — TOC Transition Note (Addendum)
Transition of Care [facility]) - CM/SW Discharge Note   Patient Details  Name: Austin Cobb MRN: [id number] Date of Birth: [birth year]  Transition of Care [facility]) CM/SW Contact:  Trula Ore, [place] Phone Number: 05/26/2019, 1:34 PM   Clinical Narrative:     Patient will DC to: [facility]  Anticipated DC date: 05/26/19  Family notified:  [name] and [place]   Transport by: Corey Harold  ?  Per MD patient ready for DC to [facility] . RN, patient, patient's family, and facility notified of DC. Discharge Summary sent to facility. RN given number for report tele# [phone]. DC packet on chart. Ambulance transport requested for patient.  CSW signing off.      Barriers to Discharge: Active Substance Use - Placement, Continued Medical Work up, Ship broker, SNF Pending bed offer   Patient Goals and CMS Choice Patient states their goals for this hospitalization and ongoing recovery are:: Pt's roommate stated " to be home walking, talking and alcohol free" CMS Medicare.gov Compare Post Acute Care list provided to:: Other (Comment Required)(Gave web address www.medicare.gov to roommate to review) Choice offered to / list presented to : (web address was given to roommate)  Discharge Placement                       Discharge Plan and Services In-house Referral: Clinical Social Work                                   Social Determinants of Health (SDOH) Interventions     Readmission Risk Interventions No flowsheet data found.

## 2019-05-26 NOTE — Care Management Important Message (Signed)
Important Message  Patient Details  Name: Austin Cobb MRN: [id number] Date of Birth: 04-10-1960   Medicare Important Message Given:  Yes     [name] 05/26/2019, 10:27 AM

## 2019-05-26 NOTE — Progress Notes (Signed)
Physical Therapy Treatment Patient Details Name: Austin Cobb MRN: [id number] DOB: 12-Aug-1960 Today's Date: 05/26/2019    History of Present Illness Patient is a 59 year old African-American male with past medical history significant for tobacco use disorder, substance abuse (crack and marijuana), bipolar disorder and lifelong alcoholism.  Patient's last drink was 2 days ago.  Patient presents with 1 day history of worsening shortness of breath.    PT Comments    Pt doing much better during entire tx this day. Did better with following cues and instructions, did better with communication and also better with ambulation. Pt was able to get around room with mod I, but still needs caution sec to fall risk. Ambulated in hall approx 357ft with no AD and min guard assist on gait belt. Some losses of balance noted but able to correct with min guard assist.     Follow Up Recommendations  SNF;Supervision/Assistance - 24 hour     Equipment Recommendations  None recommended by PT    Recommendations for Other Services       Precautions / Restrictions Precautions Precautions: Fall Restrictions Weight Bearing Restrictions: No    Mobility  Bed Mobility Overal bed mobility: Independent                Transfers Overall transfer level: Modified independent Equipment used: None                Ambulation/Gait Ambulation/Gait assistance: Min guard Gait Distance (Feet): 300 Feet Assistive device: None Gait Pattern/deviations: Step-through pattern Gait velocity: dec   General Gait Details: some minor loses of balance but pt was able to ambulate approx 378ft with no AD and min guard assist on gait belt   Stairs             Wheelchair Mobility    Modified Rankin (Stroke Patients Only)       Balance Overall balance assessment: Needs assistance Sitting-balance support: Feet supported Sitting balance-Leahy Scale: Good Sitting balance - Comments: sat egde of  bed with UE activity and no loss of balance   Standing balance support: During functional activity Standing balance-Leahy Scale: Fair                              Cognition Arousal/Alertness: Awake/alert Behavior During Therapy: WFL for tasks assessed/performed Overall Cognitive Status: History of cognitive impairments - at baseline                                 General Comments: seemed to be better able to follow cues today and also was talking more clear, not always making compleet sense but more cogent      Exercises      General Comments        Pertinent Vitals/Pain Pain Assessment: No/denies pain    Home Living                      Prior Function            PT Goals (current goals can now be found in the care plan section) Acute Rehab PT Goals Patient Stated Goal: pt more awake and alert this day, excited to have found placement for dc PT Goal Formulation: With patient Time For Goal Achievement: 05/31/19 Potential to Achieve Goals: Good Progress towards PT goals: Progressing toward goals    Frequency  Min 3X/week      PT Plan Current plan remains appropriate    Co-evaluation              AM-PAC PT "6 Clicks" Mobility   Outcome Measure  Help needed turning from your back to your side while in a flat bed without using bedrails?: None Help needed moving from lying on your back to sitting on the side of a flat bed without using bedrails?: None Help needed moving to and from a bed to a chair (including a wheelchair)?: A Little Help needed standing up from a chair using your arms (e.g., wheelchair or bedside chair)?: A Little Help needed to walk in hospital room?: A Little Help needed climbing 3-5 steps with a railing? : A Little 6 Click Score: 20    End of Session Equipment Utilized During Treatment: Gait belt Activity Tolerance: Patient tolerated treatment well Patient left: in bed;with call bell/phone  within reach;with bed alarm set Nurse Communication: Mobility status PT Visit Diagnosis: Unsteadiness on feet (R26.81);Muscle weakness (generalized) (M62.81);History of falling (Z91.81);Repeated falls (R29.6);Difficulty in walking, not elsewhere classified (R26.2)     Time: [id number] PT Time Calculation (min) (ACUTE ONLY): 23 min  Charges:  $Gait Training: 8-22 mins $Therapeutic Activity: 8-22 mins                     Horald Chestnut, PT    Delford Field 05/26/2019, 3:24 PM

## 2019-05-26 NOTE — Progress Notes (Signed)
Attempt to call report, no answer

## 2019-05-26 NOTE — Progress Notes (Addendum)
Discharge delayed yesterday due to awaiting insurance approval, discharge orders and discharge summary was done by Dr. Aileen Fass. Overnight no interval changes, patient seen and examined.  He is ambulating on room air, denies chest pain, lung clear to auscultation bilaterally, heart regular rate rhythm, lower extremity O no edema.  He does has impaired memory. He is stable to transfer to rehab today.  Detailed discharge summary per Dr. Earl Many on March 16.  Addendum: cardiology recommended decrease spironolactone dose from 25 mg daily to 12.5 mg daily, repeat BMP, follow-up with heart failure clinic.

## 2019-05-26 NOTE — TOC Progression Note (Signed)
Transition of Care [facility]) - Progression Note    Patient Details  Name: Austin Cobb MRN: [id number] Date of Birth: 26-Jul-1960  Transition of Care [facility]) CM/SW [place], [place] Phone Number: 05/26/2019, 2:07 PM  Clinical Narrative:      CSW faxed out [facility] and Discharge Summary to [facility] [place]. McKenzie from [employer] called CSW back to confirm that she received the fax. CSW spoke with patients contacts [place] and [place]. CSW informed them both of patients discharge. CSW called patients nurse to let her know that Corey Harold has been called and informed her that number to report will be on TOC transition note.     Expected Discharge Plan: [place] Barriers to Discharge: No Barriers Identified  Expected Discharge Plan and Services Expected Discharge Plan: [place] In-house Referral: Clinical Social Work     Living arrangements for the past 2 months: Mobile Home Expected Discharge Date: 05/25/19                                     Social Determinants of Health (SDOH) Interventions    Readmission Risk Interventions No flowsheet data found.

## 2019-05-26 NOTE — Progress Notes (Signed)
Report given to Amber at [facility] to provide transport.

## 2019-05-26 NOTE — NC FL2 (Signed)
[place] LEVEL OF CARE SCREENING TOOL     IDENTIFICATION  Patient Name: Austin Cobb Birthdate: 1960/10/08 Sex: male Admission Date (Current Location): 05/04/2019  [facility] and [place] Number:  Herbalist and Address:  [facility] [place], [place] [address] [place], [place] [address]      Provider Number: [id number]  Attending Physician Name and Address:  Florencia Reasons, MD  Relative Name and Phone Number:  [name] [phone]    Current Level of Care: Hospital Recommended Level of Care: [facility] Prior Approval Number:    Date Approved/Denied: 05/25/19 PASRR Number: [id number]  Discharge Plan: SNF    Current Diagnoses: Patient Active Problem List   Diagnosis Date Noted  . Heparin induced thrombocytopenia ([place]) 05/25/2019  . Protein-calorie malnutrition, severe 05/20/2019  . Yeast UTI 05/15/2019  . Alcohol withdrawal ([place]) 05/14/2019  . Acute metabolic encephalopathy [id number]  . AKI (acute kidney injury) ([place]) 05/14/2019  . Elevated d-dimer 05/14/2019  . Demand ischemia ([place]) 05/14/2019  . Elevated troponin 05/14/2019  . Pleural effusion 05/14/2019  . Polysubstance abuse ([place]) 05/14/2019  . E. coli UTI 05/14/2019  . Agitation 05/14/2019  . Noncompliance 05/14/2019  . Acute on chronic combined systolic and diastolic CHF (congestive heart failure) ([place])   . Atrial flutter with rapid ventricular response ([place]) 05/04/2019  . Acute heart failure ([place])   . Essential hypertension   . Herpes zoster without complication 0000000  . History of fall 07/23/2017  . Prostatitis 06/23/2014  . Serrated adenoma of colon 05/13/2012  . Anal fissure 05/13/2012  . Tobacco abuse 05/13/2012  . 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 Chronic shoulder pain 01/03/2012  . Neck pain, chronic 01/03/2012  . Mood disorder ([place]) 01/03/2012  . Depression with anxiety 01/03/2012    Orientation RESPIRATION BLADDER Height & Weight      Place, Self, Time  Normal Continent, External catheter Weight: 122 lb 6.4 oz (55.5 kg) Height:  5\' 9"  (175.3 cm)  BEHAVIORAL SYMPTOMS/MOOD NEUROLOGICAL BOWEL NUTRITION STATUS      Continent Diet(See Discharge Summary)  AMBULATORY STATUS COMMUNICATION OF NEEDS Skin   Limited Assist Verbally Normal                       Personal Care Assistance Level of Assistance  Bathing, Feeding, Dressing Bathing Assistance: Limited assistance Feeding assistance: Independent(Patient can feed self) Dressing Assistance: Limited assistance     Functional Limitations Info  Sight, Hearing, Speech Sight Info: Impaired Hearing Info: Adequate Speech Info: Adequate    SPECIAL CARE FACTORS FREQUENCY  PT (By licensed PT), OT (By licensed OT)     PT Frequency: 5x Min Weekly OT Frequency: 5x Min Weekly            Contractures Contractures Info: Not present    Additional Factors Info  Code Status, Allergies Code Status Info: FULL Allergies Info: Heparin Psychotropic Info: Haldol 1mg  3x a day         Current Medications (05/26/2019):  This is the current hospital active medication list Current Facility-Administered Medications  Medication Dose Route Frequency Provider Last Rate Last Admin  . acetaminophen (TYLENOL) tablet 650 mg  650 mg Oral Q4H PRN Larey Dresser, MD      . amiodarone (PACERONE) tablet 200 mg  200 mg Oral BID Allie Bossier, MD   200 mg at 05/26/19 [id number]  . apixaban (ELIQUIS) tablet 5 mg  5 mg Oral BID Larey Dresser, MD  5 mg at 05/26/19 0938  . carvedilol (COREG) tablet 3.125 mg  3.125 mg Oral BID WC Larey Dresser, MD   3.125 mg at 05/26/19 [id number]  . digoxin (LANOXIN) tablet 0.125 mg  0.125 mg Oral Daily Larey Dresser, MD   0.125 mg at 05/26/19 [id number]  . feeding supplement (ENSURE ENLIVE) (ENSURE ENLIVE) liquid 237 mL  237 mL Oral 4x daily Allie Bossier, MD   237 mL at 05/26/19 0944  . folic acid (FOLVITE) tablet 1 mg  1 mg Oral Daily Charlynne Cousins,  MD   1 mg at 05/26/19 [id number]  . haloperidol lactate (HALDOL) injection 1 mg  1 mg Intravenous TID Allie Bossier, MD   1 mg at 05/26/19 0939  . hydrALAZINE (APRESOLINE) injection 10 mg  10 mg Intravenous Q6H PRN Allie Bossier, MD   10 mg at 05/18/19 2031  . lactulose (CHRONULAC) 10 GM/15ML solution 20 g  20 g Oral BID Wendee Beavers T, MD   20 g at 05/26/19 [id number]   Or  . lactulose (CHRONULAC) enema 200 gm  300 mL Rectal BID Wendee Beavers T, MD      . metoprolol tartrate (LOPRESSOR) injection 2.5 mg  2.5 mg Intravenous Q2H PRN Allie Bossier, MD      . multivitamin with minerals tablet 1 tablet  1 tablet Oral Daily Allie Bossier, MD   1 tablet at 05/26/19 [phone]  . nicotine (NICODERM CQ - dosed in mg/24 hours) patch 21 mg  21 mg Transdermal Daily Roby Lofts M., PA-C   21 mg at 05/26/19 0939  . ondansetron (ZOFRAN) injection 4 mg  4 mg Intravenous Q6H PRN Larey Dresser, MD      . sacubitril-valsartan (ENTRESTO) 24-26 mg per tablet  1 tablet Oral BID Larey Dresser, MD   1 tablet at 05/26/19 [id number]  . sodium chloride flush (NS) 0.9 % injection 3 mL  3 mL Intravenous Q12H Larey Dresser, MD   3 mL at 05/26/19 0944  . sodium chloride flush (NS) 0.9 % injection 3 mL  3 mL Intravenous PRN Larey Dresser, MD      . spironolactone (ALDACTONE) tablet 25 mg  25 mg Oral Daily Larey Dresser, MD   25 mg at 05/26/19 [id number]  . thiamine tablet 100 mg  100 mg Oral Daily Nahser, Wonda Cheng, MD   100 mg at 05/26/19 [id number]   Or  . thiamine (B-1) injection 100 mg  100 mg Intravenous Daily Nahser, Wonda Cheng, MD   100 mg at 05/20/19 1039  . traZODone (DESYREL) tablet 50 mg  50 mg Oral QHS PRN Kirby-Graham, Karsten Fells, NP         Discharge Medications: Please see discharge summary for a list of discharge medications.  Relevant Imaging Results:  Relevant Lab Results:   Additional Information [phone]  Trula Ore, LCSWA

## 2019-05-27 DIAGNOSIS — N179 Acute kidney failure, unspecified: Secondary | ICD-10-CM | POA: Diagnosis not present

## 2019-05-27 DIAGNOSIS — I5021 Acute systolic (congestive) heart failure: Secondary | ICD-10-CM | POA: Diagnosis not present

## 2019-05-27 DIAGNOSIS — G9341 Metabolic encephalopathy: Secondary | ICD-10-CM | POA: Diagnosis not present

## 2019-05-27 DIAGNOSIS — I4892 Unspecified atrial flutter: Secondary | ICD-10-CM | POA: Diagnosis not present

## 2019-05-27 DIAGNOSIS — I130 Hypertensive heart and chronic kidney disease with heart failure and stage 1 through stage 4 chronic kidney disease, or unspecified chronic kidney disease: Secondary | ICD-10-CM | POA: Diagnosis not present

## 2019-05-27 DIAGNOSIS — R778 Other specified abnormalities of plasma proteins: Secondary | ICD-10-CM | POA: Diagnosis not present

## 2019-05-27 DIAGNOSIS — D696 Thrombocytopenia, unspecified: Secondary | ICD-10-CM | POA: Diagnosis not present

## 2019-06-01 ENCOUNTER — Encounter (HOSPITAL_COMMUNITY): Payer: Medicare PPO

## 2019-06-15 ENCOUNTER — Encounter (HOSPITAL_COMMUNITY): Payer: Self-pay | Admitting: Emergency Medicine

## 2019-06-15 ENCOUNTER — Other Ambulatory Visit: Payer: Self-pay

## 2019-06-15 ENCOUNTER — Emergency Department (HOSPITAL_COMMUNITY)
Admission: EM | Admit: 2019-06-15 | Discharge: 2019-06-17 | Disposition: A | Discharge status: Home / Self Care | Payer: Medicare PPO | Attending: Emergency Medicine | Admitting: Emergency Medicine

## 2019-06-15 DIAGNOSIS — Z79899 Other long term (current) drug therapy: Secondary | ICD-10-CM | POA: Diagnosis not present

## 2019-06-15 DIAGNOSIS — R079 Chest pain, unspecified: Secondary | ICD-10-CM | POA: Diagnosis not present

## 2019-06-15 DIAGNOSIS — R29810 Facial weakness: Secondary | ICD-10-CM | POA: Diagnosis not present

## 2019-06-15 DIAGNOSIS — F10920 Alcohol use, unspecified with intoxication, uncomplicated: Secondary | ICD-10-CM | POA: Diagnosis not present

## 2019-06-15 DIAGNOSIS — F17210 Nicotine dependence, cigarettes, uncomplicated: Secondary | ICD-10-CM | POA: Insufficient documentation

## 2019-06-15 DIAGNOSIS — F10929 Alcohol use, unspecified with intoxication, unspecified: Secondary | ICD-10-CM | POA: Diagnosis not present

## 2019-06-15 DIAGNOSIS — F10129 Alcohol abuse with intoxication, unspecified: Secondary | ICD-10-CM | POA: Diagnosis not present

## 2019-06-15 DIAGNOSIS — R2681 Unsteadiness on feet: Secondary | ICD-10-CM | POA: Insufficient documentation

## 2019-06-15 DIAGNOSIS — R457 State of emotional shock and stress, unspecified: Secondary | ICD-10-CM | POA: Diagnosis not present

## 2019-06-15 DIAGNOSIS — D649 Anemia, unspecified: Secondary | ICD-10-CM | POA: Insufficient documentation

## 2019-06-15 DIAGNOSIS — R0602 Shortness of breath: Secondary | ICD-10-CM | POA: Diagnosis not present

## 2019-06-15 DIAGNOSIS — R531 Weakness: Secondary | ICD-10-CM | POA: Diagnosis not present

## 2019-06-15 DIAGNOSIS — Y908 Blood alcohol level of 240 mg/100 ml or more: Secondary | ICD-10-CM | POA: Insufficient documentation

## 2019-06-15 DIAGNOSIS — F319 Bipolar disorder, unspecified: Secondary | ICD-10-CM | POA: Diagnosis not present

## 2019-06-15 DIAGNOSIS — Z20822 Contact with and (suspected) exposure to covid-19: Secondary | ICD-10-CM | POA: Diagnosis not present

## 2019-06-15 DIAGNOSIS — I10 Essential (primary) hypertension: Secondary | ICD-10-CM | POA: Diagnosis not present

## 2019-06-15 NOTE — ED Triage Notes (Signed)
Pt brought to ED by [facility] for ETOH  Abuse, pt states he is not able to function at this time and wants to be admitted.

## 2019-06-16 ENCOUNTER — Emergency Department (HOSPITAL_COMMUNITY): Payer: Medicare PPO

## 2019-06-16 DIAGNOSIS — R001 Bradycardia, unspecified: Secondary | ICD-10-CM | POA: Diagnosis not present

## 2019-06-16 DIAGNOSIS — R0602 Shortness of breath: Secondary | ICD-10-CM | POA: Diagnosis not present

## 2019-06-16 DIAGNOSIS — Z20822 Contact with and (suspected) exposure to covid-19: Secondary | ICD-10-CM | POA: Diagnosis not present

## 2019-06-16 DIAGNOSIS — F10920 Alcohol use, unspecified with intoxication, uncomplicated: Secondary | ICD-10-CM | POA: Diagnosis not present

## 2019-06-16 DIAGNOSIS — R079 Chest pain, unspecified: Secondary | ICD-10-CM | POA: Diagnosis not present

## 2019-06-16 LAB — CBC WITH DIFFERENTIAL/PLATELET
Abs Immature Granulocytes: 0.01 10*3/uL (ref 0.00–0.07)
Basophils Absolute: 0.1 10*3/uL (ref 0.0–0.1)
Basophils Relative: 2 %
Eosinophils Absolute: 0.3 10*3/uL (ref 0.0–0.5)
Eosinophils Relative: 6 %
HCT: 32.1 % — ABNORMAL LOW (ref 39.0–52.0)
Hemoglobin: 10.9 g/dL — ABNORMAL LOW (ref 13.0–17.0)
Immature Granulocytes: 0 %
Lymphocytes Relative: 34 %
Lymphs Abs: 1.7 10*3/uL (ref 0.7–4.0)
MCH: 30 pg (ref 26.0–34.0)
MCHC: 34 g/dL (ref 30.0–36.0)
MCV: 88.4 fL (ref 80.0–100.0)
Monocytes Absolute: 0.5 10*3/uL (ref 0.1–1.0)
Monocytes Relative: 9 %
Neutro Abs: 2.5 10*3/uL (ref 1.7–7.7)
Neutrophils Relative %: 49 %
Platelets: 74 10*3/uL — ABNORMAL LOW (ref 150–400)
RBC: 3.63 MIL/uL — ABNORMAL LOW (ref 4.22–5.81)
RDW: 14.2 % (ref 11.5–15.5)
WBC: 5 10*3/uL (ref 4.0–10.5)
nRBC: 0 % (ref 0.0–0.2)

## 2019-06-16 LAB — COMPREHENSIVE METABOLIC PANEL
ALT: 74 U/L — ABNORMAL HIGH (ref 0–44)
AST: 135 U/L — ABNORMAL HIGH (ref 15–41)
Albumin: 3.9 g/dL (ref 3.5–5.0)
Alkaline Phosphatase: 85 U/L (ref 38–126)
Anion gap: 12 (ref 5–15)
BUN: 9 mg/dL (ref 6–20)
CO2: 26 mmol/L (ref 22–32)
Calcium: 8.3 mg/dL — ABNORMAL LOW (ref 8.9–10.3)
Chloride: 104 mmol/L (ref 98–111)
Creatinine, Ser: 1.07 mg/dL (ref 0.61–1.24)
GFR calc Af Amer: >60 mL/min (ref >60)
GFR calc non Af Amer: >60 mL/min (ref >60)
Glucose, Bld: 82 mg/dL (ref 70–99)
Potassium: 3.8 mmol/L (ref 3.5–5.1)
Sodium: 142 mmol/L (ref 135–145)
Total Bilirubin: 0.7 mg/dL (ref 0.3–1.2)
Total Protein: 8.1 g/dL (ref 6.5–8.1)

## 2019-06-16 LAB — ETHANOL: Alcohol, Ethyl (B): 374 mg/dL (ref <10)

## 2019-06-16 MED ORDER — LACTULOSE 10 GM/15ML PO SOLN
20.0000 g | Freq: Two times a day (BID) | ORAL | Status: DC
  Filled 2019-06-16: qty 30

## 2019-06-16 MED ORDER — SPIRONOLACTONE 12.5 MG HALF TABLET
12.5000 mg | ORAL_TABLET | Freq: Every day | ORAL | Status: DC
  Administered 2019-06-16: 12.5 mg via ORAL
  Filled 2019-06-16: qty 1

## 2019-06-16 MED ORDER — HYDROXYZINE HCL 25 MG PO TABS
25.0000 mg | ORAL_TABLET | Freq: Four times a day (QID) | ORAL | Status: DC | PRN
  Administered 2019-06-16: 21:00:00 25 mg via ORAL
  Filled 2019-06-16: qty 1

## 2019-06-16 MED ORDER — THIAMINE HCL 100 MG/ML IJ SOLN
100.0000 mg | Freq: Once | INTRAMUSCULAR | Status: AC
  Administered 2019-06-16: 100 mg via INTRAMUSCULAR
  Filled 2019-06-16: qty 2

## 2019-06-16 MED ORDER — LOPERAMIDE HCL 2 MG PO CAPS: 2.0000 mg | ORAL_CAPSULE | ORAL | Status: DC | PRN

## 2019-06-16 MED ORDER — LORAZEPAM 1 MG PO TABS
1.0000 mg | ORAL_TABLET | Freq: Once | ORAL | Status: AC
  Administered 2019-06-16: 1 mg via ORAL
  Filled 2019-06-16: qty 1

## 2019-06-16 MED ORDER — CARVEDILOL 3.125 MG PO TABS
3.1250 mg | ORAL_TABLET | Freq: Two times a day (BID) | ORAL | Status: DC
  Filled 2019-06-16: qty 1

## 2019-06-16 MED ORDER — THIAMINE HCL 100 MG PO TABS: 100.0000 mg | ORAL_TABLET | Freq: Every day | ORAL | Status: DC

## 2019-06-16 MED ORDER — ADULT MULTIVITAMIN W/MINERALS CH: 1.0000 | ORAL_TABLET | Freq: Every day | ORAL | Status: DC

## 2019-06-16 MED ORDER — SACUBITRIL-VALSARTAN 24-26 MG PO TABS
1.0000 | ORAL_TABLET | Freq: Two times a day (BID) | ORAL | Status: DC
  Filled 2019-06-16: qty 1

## 2019-06-16 MED ORDER — DIGOXIN 125 MCG PO TABS
0.1250 mg | ORAL_TABLET | Freq: Every day | ORAL | Status: DC
  Administered 2019-06-16: 21:00:00 0.125 mg via ORAL
  Filled 2019-06-16: qty 1

## 2019-06-16 MED ORDER — APIXABAN 5 MG PO TABS
5.0000 mg | ORAL_TABLET | Freq: Two times a day (BID) | ORAL | Status: DC
  Filled 2019-06-16: qty 1

## 2019-06-16 MED ORDER — ONDANSETRON 4 MG PO TBDP: 4.0000 mg | ORAL_TABLET | Freq: Four times a day (QID) | ORAL | Status: DC | PRN

## 2019-06-16 MED ORDER — CHLORDIAZEPOXIDE HCL 25 MG PO CAPS: 25.0000 mg | ORAL_CAPSULE | Freq: Four times a day (QID) | ORAL | Status: DC | PRN

## 2019-06-16 MED ORDER — LORAZEPAM 1 MG PO TABS
2.0000 mg | ORAL_TABLET | Freq: Once | ORAL | Status: AC
  Administered 2019-06-16: 07:00:00 2 mg via ORAL
  Filled 2019-06-16: qty 2

## 2019-06-16 MED ORDER — AMIODARONE HCL 200 MG PO TABS: 200.0000 mg | ORAL_TABLET | Freq: Two times a day (BID) | ORAL | Status: DC

## 2019-06-16 MED ORDER — ENSURE ENLIVE PO LIQD: 237.0000 mL | Freq: Three times a day (TID) | ORAL | Status: DC

## 2019-06-16 NOTE — Evaluation (Signed)
Physical Therapy Evaluation Patient Details Name: Austin Cobb MRN: [id number] DOB: 01-01-1961 Today's Date: 06/16/2019   History of Present Illness  Pt is a 59 y/o male admitted secondary to alcohol intoxication. PMH includes bipolar disorder, depression, alcohol abuse, substance abuse, and tobacco abuse.   Clinical Impression  Pt admitted secondary to problem above with deficits below. Pt very anxious throughout all mobility. Pt initially with no tremors when laying or sitting in WC, however, when transferring, demonstrated full body tremors and increased anxiety. Pt very fearful of falling. Required mod-max A +2 to perform transfers this session. Feel pt is at increased risk for falls. Does not have 24/7 assist at home. Will continue to follow acutely to maximize functional mobility independence and safety.     Follow Up Recommendations SNF;Supervision/Assistance - 24 hour    Equipment Recommendations  Wheelchair (measurements PT);Wheelchair cushion (measurements PT)    Recommendations for Other Services       Precautions / Restrictions Precautions Precautions: Fall Restrictions Weight Bearing Restrictions: No      Mobility  Bed Mobility Overal bed mobility: Needs Assistance Bed Mobility: Supine to Sit;Sit to Supine     Supine to sit: Min guard Sit to supine: Min guard   General bed mobility comments: Min guard for safety. Increased time to perform bed mobility tasks.   Transfers Overall transfer level: Needs assistance Equipment used: None;Rolling walker (2 wheeled) Transfers: Sit to/from [facility] Sit to Stand: Mod assist;Max assist;+2 physical assistance;+2 safety/equipment Stand pivot transfers: Mod assist;Max assist;+2 physical assistance;+2 safety/equipment       General transfer comment: Mod to max A +2 to perform stand pivot transfer X4 to go to bathroom. Had to use WC for transport secondary to full body tremors when transferring. Pt also  with increased anxiety when attempting to transfer.   Ambulation/Gait             General Gait Details: unable   Stairs            Wheelchair Mobility    Modified Rankin (Stroke Patients Only)       Balance Overall balance assessment: Needs assistance Sitting-balance support: No upper extremity supported;Feet supported Sitting balance-Leahy Scale: Fair   Postural control: Posterior lean Standing balance support: Bilateral upper extremity supported;During functional activity Standing balance-Leahy Scale: Poor Standing balance comment: Reliant on UE and external support to transfer secondary to full body tremors. Posterior lean during transfer.                              Pertinent Vitals/Pain Pain Assessment: No/denies pain    Home Living Family/patient expects to be discharged to:: Private residence Living Arrangements: Non-relatives/Friends Available Help at Discharge: Friend(s);Available PRN/intermittently Type of Home: House Home Access: Stairs to enter Entrance Stairs-Rails: Left;Right;Can reach both Entrance Stairs-Number of Steps: 5 Home Layout: One level Home Equipment: Walker - 2 wheels;Cane - single point      Prior Function Level of Independence: Independent with assistive device(s)         Comments: Reports using RW at home      Hand Dominance        Extremity/Trunk Assessment   Upper Extremity Assessment Upper Extremity Assessment: Generalized weakness    Lower Extremity Assessment Lower Extremity Assessment: Generalized weakness(tremors noted bilaterally with standing and transfers. )    Cervical / Trunk Assessment Cervical / Trunk Assessment: Kyphotic  Communication   Communication: No difficulties  Cognition Arousal/Alertness:  Awake/alert Behavior During Therapy: Anxious Overall Cognitive Status: No family/caregiver present to determine baseline cognitive functioning                                  General Comments: Pt very anxious throughout mobility tasks. Reports he can't walk when up and moving. Full body tremors noted when standing and transferring.       General Comments      Exercises     Assessment/Plan    PT Assessment Patient needs continued PT services  PT Problem List Decreased strength;Decreased balance;Decreased activity tolerance;Decreased mobility;Decreased cognition;Decreased knowledge of use of DME;Decreased safety awareness;Decreased knowledge of precautions       PT Treatment Interventions DME instruction;Gait training;Therapeutic exercise;Functional mobility training;Stair training;Therapeutic activities;Balance training;Cognitive remediation;Patient/family education    PT Goals (Current goals can be found in the Care Plan section)  Acute Rehab PT Goals Patient Stated Goal: to go to bathroom  PT Goal Formulation: With patient Time For Goal Achievement: 06/30/19 Potential to Achieve Goals: Fair    Frequency Min 2X/week   Barriers to discharge Decreased caregiver support      Co-evaluation               AM-PAC PT "6 Clicks" Mobility  Outcome Measure Help needed turning from your back to your side while in a flat bed without using bedrails?: A Little Help needed moving from lying on your back to sitting on the side of a flat bed without using bedrails?: A Little Help needed moving to and from a bed to a chair (including a wheelchair)?: A Lot Help needed standing up from a chair using your arms (e.g., wheelchair or bedside chair)?: A Lot Help needed to walk in hospital room?: Total Help needed climbing 3-5 steps with a railing? : Total 6 Click Score: 12    End of Session Equipment Utilized During Treatment: Gait belt Activity Tolerance: Patient tolerated treatment well Patient left: in bed;with call bell/phone within reach;with nursing/sitter in room Nurse Communication: Mobility status PT Visit Diagnosis: Unsteadiness on feet  (R26.81);Muscle weakness (generalized) (M62.81);Difficulty in walking, not elsewhere classified (R26.2)    Time: [id number] PT Time Calculation (min) (ACUTE ONLY): 26 min   Charges:   PT Evaluation $PT Eval Moderate Complexity: 1 Mod PT Treatments $Therapeutic Activity: 8-22 mins        Lou Miner, DPT  Acute Rehabilitation Services  Pager: [phone] Office: [phone]   Rudean Hitt 06/16/2019, 6:01 PM

## 2019-06-16 NOTE — ED Provider Notes (Signed)
Care assumed from Dr. Kathrynn Humble.  At time of transfer of care, patient is awaiting reassessment after metabolism of alcohol after reassuring work-up in the emergency department.  Patient reports she is feeling better after metabolizing the alcohol and Ativan.  He had improvement in the shaking he was experiencing.  His work-up was overall reassuring aside from elevated alcohol.  Blood pressure has improved.  Patient able to eat and drink without difficulty.  Patient reports he was still feeling slightly nauseous and will be given a prescription for nausea medication.  He can finish sleeping off his intoxication at home once he is able to ambulate safely.  Anticipate discharge home shortly.  Pt has more shaking and says that he cant walk. He is refusing to tryh and ambulate in the ED. Will consult PT to eval and see what he may need for his ambulation problems.   3:56 PM Care transferred, and has shaking when he is asked to try and ambulate or move.  While not being asked to ambulate, he is resting comfortably and appears in no distress.        Dequan Kindred, Gwenyth Allegra, MD 06/16/19 [phone]

## 2019-06-16 NOTE — Social Work (Signed)
EDCSW called [facility] in [place] to try to ascertain the amount of Medicare days that Pt has used. Unable to reach anyone with that information.  CSW will proceed with SNF placement per SOP.

## 2019-06-16 NOTE — ED Notes (Signed)
ptar called 

## 2019-06-16 NOTE — Care Management (Signed)
    Durable Medical Equipment  (From admission, onward)         Start     Ordered   06/16/19 0000  For home use only DME standard manual wheelchair with seat cushion    Comments: Patient suffers from decreased safety with ambulation which impairs their ability to perform daily activities like bathing, dressing, feeding, grooming and toileting in the home.  A walker will not resolve issue with performing activities of daily living. A wheelchair will allow patient to safely perform daily activities. Patient can safely propel the wheelchair in the home or has a caregiver who can provide assistance. Length of need Lifetime. Accessories: elevating leg rests (ELRs), wheel locks, extensions and anti-tippers.   06/16/19 2247   06/16/19 0000  For home use only DME Hospital bed    Question Answer Comment  Length of Need Lifetime   The above medical condition requires: Patient requires the ability to reposition frequently   Bed type Semi-electric      06/16/19 2247

## 2019-06-16 NOTE — Discharge Instructions (Signed)
Please drink alcohol only in moderation and be sure to follow-up with a primary care physician. You have had referral for home health services, please take advantage of these as they come to your house for an assessment.

## 2019-06-16 NOTE — Progress Notes (Addendum)
CSW spoke with roommate [name] [phone] and discovered that Pt eloped from last SNF. Pt was offered HH as an alternative, and Pt agreed.

## 2019-06-16 NOTE — ED Notes (Signed)
Pt stated he needed to use bathroom.  States he is weak and he is afraid he is going to fall.  Inconsistencies noted when pt was sleeping, or staff was not watching, pt would not shake.  However, pt needed 2 staff to assist him to toilet because he was shaking so badly. MD is aware.

## 2019-06-16 NOTE — ED Provider Notes (Signed)
[facility] EMERGENCY DEPARTMENT Provider Note   CSN: [id number] Arrival date & time: 06/15/19  2321     History Chief Complaint  Patient presents with  . Alcohol Intoxication    Austin Cobb is a 59 y.o. male with a PMH of substance abuse, depression, A flutter with RVR, HTN that presents to the ER for alcoholic intoxication. He states that he has been drinking about two 42 ounces of beer daily. Today he states that he came into the ED because he was having chest pain. He states that he drank about  half a 42 ounce beer this morning (4/7) when he started having chest pain. Pt is a poor historian and is easily distracted due to his intoxication. He points to his entire chest when asked where the pain is. He states that he has had pain like this in the past. He denies any pain elsewhere - no abdominal, back, extremity pain. He denies fever, headache, vision changes, nausea, vomiting, trouble breathing, diaphoresis, cough, hemoptysis. He denies any homicidal or suicidal ideations. He constantly mentions that he wants to make it to his 59th birthday which is this Friday.    He was recently admitted on 05/24/19 for atrial flutter with RVR - carrdiology was consulted for flutter with RVR and acute heart failure and he was started on Lasix. During that admission he had a negative cardiac cath and an echo. He did have cardioversion on 05/06/19.  HPI     Past Medical History:  Diagnosis Date  . Anxiety   . Arthritis   . Bipolar 1 disorder ([place])   . Depression    Bipolar, not on meds/pt. stopped meds on own over 12 yrs ago  . GERD (gastroesophageal reflux disease)   . History of colon polyps   . Manic disorder ([place])   . Noncompliance 05/14/2019  . Substance abuse ([place])    36yrs ago smoke crack and marjuana  . Tobacco abuse 05/13/2012    Patient Active Problem List   Diagnosis Date Noted  . Heparin induced thrombocytopenia ([place]) 05/25/2019  . Protein-calorie malnutrition,  severe 05/20/2019  . Yeast UTI 05/15/2019  . Alcohol withdrawal ([place]) 05/14/2019  . Acute metabolic encephalopathy [id number]  . AKI (acute kidney injury) ([place]) 05/14/2019  . Elevated d-dimer 05/14/2019  . Demand ischemia ([place]) 05/14/2019  . Elevated troponin 05/14/2019  . Pleural effusion 05/14/2019  . Polysubstance abuse ([place]) 05/14/2019  . E. coli UTI 05/14/2019  . Agitation 05/14/2019  . Noncompliance 05/14/2019  . Acute on chronic combined systolic and diastolic CHF (congestive heart failure) ([place])   . Atrial flutter with rapid ventricular response ([place]) 05/04/2019  . Acute heart failure ([place])   . Essential hypertension   . Herpes zoster without complication 0000000  . History of fall 07/23/2017  . Prostatitis 06/23/2014  . Serrated adenoma of colon 05/13/2012  . Anal fissure 05/13/2012  . Tobacco abuse 05/13/2012  . 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 Chronic shoulder pain 01/03/2012  . Neck pain, chronic 01/03/2012  . Mood disorder ([place]) 01/03/2012  . Depression with anxiety 01/03/2012    Past Surgical History:  Procedure Laterality Date  . CARDIOVERSION N/A 05/06/2019   Procedure: CARDIOVERSION;  Surgeon: Acie Fredrickson Wonda Cheng, MD;  [facility] ENDOSCOPY;  Service: Cardiovascular;  Laterality: N/A;  . CARDIOVERSION N/A 05/20/2019   Procedure: CARDIOVERSION;  Surgeon: Larey Dresser, MD;  Location: [place];  Service: Cardiovascular;  Laterality: N/A;  . EXAMINATION UNDER ANESTHESIA N/A 11/12/2012  Procedure: EXAM UNDER ANESTHESIA;  Surgeon: Adin Hector, MD;  Location: [facility] ORS;  Service: General;  Laterality: N/A;  . EYE SURGERY     bilateral eye sx as a child 6-10yrs old.  Marland Kitchen RIGHT/LEFT HEART CATH AND CORONARY ANGIOGRAPHY N/A 05/18/2019   Procedure: RIGHT/LEFT HEART CATH AND CORONARY ANGIOGRAPHY;  Surgeon: Larey Dresser, MD;  Location: [facility] CV LAB;  Service: Cardiovascular;  Laterality: N/A;  . SPHINCTEROTOMY N/A 11/12/2012   Procedure:  SPHINCTEROTOMY;  Surgeon: Adin Hector, MD;  Location: [facility] ORS;  Service: General;  Laterality: N/A;  . TEE WITHOUT CARDIOVERSION N/A 05/06/2019   Procedure: TRANSESOPHAGEAL ECHOCARDIOGRAM (TEE);  Surgeon: Acie Fredrickson Wonda Cheng, MD;  Location: [facility];  Service: Cardiovascular;  Laterality: N/A;  . TEE WITHOUT CARDIOVERSION N/A 05/20/2019   Procedure: TRANSESOPHAGEAL ECHOCARDIOGRAM (TEE);  Surgeon: Larey Dresser, MD;  Location: [facility] ENDOSCOPY;  Service: Cardiovascular;  Laterality: N/A;  . TONSILLECTOMY         Family History  Problem Relation Age of Onset  . Colon polyps Father   . Diabetes Father   . Diabetes Brother     Social History   Tobacco Use  . Smoking status: Current Every Day Smoker    Packs/day: 1.00    Years: 35.00    Pack years: 35.00    Types: Cigarettes  . Smokeless tobacco: Never Used  Substance Use Topics  . Alcohol use: Yes    Alcohol/week: 4.0 standard drinks    Types: 4 Cans of beer per week  . Drug use: Yes    Types: Cocaine, Marijuana    Comment: Quit x6 yrs ago-     Home Medications Prior to Admission medications   Medication Sig Start Date End Date Taking? Authorizing Provider  amiodarone (PACERONE) 200 MG tablet Take 1 tablet (200 mg total) by mouth 2 (two) times daily. 05/25/19   Charlynne Cousins, MD  apixaban (ELIQUIS) 5 MG TABS tablet Take 1 tablet (5 mg total) by mouth 2 (two) times daily. 05/25/19   Charlynne Cousins, MD  carvedilol (COREG) 3.125 MG tablet Take 1 tablet (3.125 mg total) by mouth 2 (two) times daily with a meal. 05/25/19   Charlynne Cousins, MD  digoxin (LANOXIN) 0.125 MG tablet Take 1 tablet (0.125 mg total) by mouth daily. 05/25/19   Charlynne Cousins, MD  feeding supplement, ENSURE ENLIVE, (ENSURE ENLIVE) LIQD Take 237 mLs by mouth in the morning, at noon, in the evening, and at bedtime. 05/25/19   Charlynne Cousins, MD  lactulose (CHRONULAC) 10 GM/15ML solution Take 30 mLs (20 g total) by mouth 2 (two) times  daily. 05/25/19   Charlynne Cousins, MD  Multiple Vitamin (MULTIVITAMIN WITH MINERALS) TABS tablet Take 1 tablet by mouth daily. 05/25/19   Charlynne Cousins, MD  sacubitril-valsartan (ENTRESTO) 24-26 MG Take 1 tablet by mouth 2 (two) times daily. 05/25/19   Charlynne Cousins, MD  spironolactone (ALDACTONE) 25 MG tablet Take 0.5 tablets (12.5 mg total) by mouth daily. 05/27/19   Florencia Reasons, MD    Allergies    Heparin  Review of Systems   Review of Systems  Constitutional: Negative for chills, diaphoresis, fatigue and fever.  HENT: Negative.   Respiratory: Negative for cough, shortness of breath and wheezing.   Cardiovascular: Positive for chest pain. Negative for palpitations and leg swelling.  Gastrointestinal: Negative.   Genitourinary: Negative.   Musculoskeletal: Negative.   Neurological: Negative.   Psychiatric/Behavioral: Negative.     Physical  Exam Updated Vital Signs BP (!) 178/88   Pulse 86   Temp 98.6 F (37 C)   Resp 15   SpO2 97%   Physical Exam Constitutional:      General: He is not in acute distress.    Appearance: Normal appearance. He is not ill-appearing, toxic-appearing or diaphoretic.  HENT:     Head: Normocephalic and atraumatic.  Cardiovascular:     Rate and Rhythm: Normal rate and regular rhythm.     Pulses: Normal pulses.     Heart sounds: Normal heart sounds.  Pulmonary:     Effort: Pulmonary effort is normal. No respiratory distress.     Breath sounds: Normal breath sounds. No wheezing or rales.  Musculoskeletal:        General: Normal range of motion.     Cervical back: Normal range of motion and neck supple.  Skin:    General: Skin is warm and dry.     Capillary Refill: Capillary refill takes less than 2 seconds.  Neurological:     General: No focal deficit present.     Mental Status: He is oriented to person, place, and time.     ED Results / Procedures / Treatments   Labs (all labs ordered are listed, but only abnormal results  are displayed) Labs Reviewed  CBC WITH DIFFERENTIAL/PLATELET - Abnormal; Notable for the following components:      Result Value   RBC 3.63 (*)    Hemoglobin 10.9 (*)    HCT 32.1 (*)    Platelets 74 (*)    All other components within normal limits  COMPREHENSIVE METABOLIC PANEL - Abnormal; Notable for the following components:   Calcium 8.3 (*)    AST 135 (*)    ALT 74 (*)    All other components within normal limits  ETHANOL - Abnormal; Notable for the following components:   Alcohol, Ethyl (B) 374 (*)    All other components within normal limits    EKG None  Radiology No results found.  Procedures Procedures (including critical care time)  Medications Ordered in ED Medications - No data to display  ED Course  I have reviewed the triage vital signs and the nursing notes.  Pertinent labs & imaging results that were available during my care of the patient were reviewed by me and considered in my medical decision making (see chart for details).    MDM Rules/Calculators/A&P  Cuong Soloff is a 59 year old male that presents to the ER for alcoholic intoxication without delirium. He presented with ethanol level of 374. He has no AMS. Pt was complaining  of chest pain to me today. He had a negative EKG in the ER with recent negative cardiac cath. Discussed need for troponins with Dr. Kathrynn Humble who said it was not needed at this time. Chest pain unlikely to be ACS due to recent cardiac cath. CXR ordered to r/o PNA, PNX, dissection. Pt is stable and chest pain might be due to gastritis/esophagitis from alcohol.  Pt states that he feels much better with Ativan on board.   Pt's labs show anemia and thrombocytopenia. Hgb is stable from last visits. Platelets have decreased (74) most likely due to alcoholism/ liver disease.   No signs of withdrawal at this time. Dr. Kathrynn Humble accessed and evaluated pt and agrees with assessment and plan.Sign out given to Dr. Sherry Ruffing. Final Clinical  Impression(s) / ED Diagnoses Final diagnoses:  None    Rx / DC Orders ED Discharge Orders  None       Alfredia Client, PA-C 06/17/19 Andrews, Ankit, MD 06/22/19 1626

## 2019-06-16 NOTE — ED Notes (Signed)
This RN and Lilly, EMT changed pt into clean gown and provided fresh linens. Condom cath placed for comfort and safety.

## 2019-06-16 NOTE — Social Work (Signed)
CSW counseled Pt on the importance of primary care. CSW attempted to connect Pt with PCP resources, Pt refused resources. CSW gave education on importance of compliance with mental health medication regimen and reiterated the connection between connection with PCP and all health. CSW also offered substance use resources. Pt refused.   CSW faxed out [facility] and other forms to various SNFs

## 2019-06-16 NOTE — ED Notes (Signed)
Date and time results received: 06/16/19  Test: ETO Critical Value: 374  Name of Provider Notified:Nanavati

## 2019-06-16 NOTE — ED Provider Notes (Addendum)
Now almost 1 day after initial evaluation, after presenting for acute alcohol intoxication, patient still is unable to walk.  He is not shaking, has no evidence for decompensated alcohol withdrawal, labs are generally unremarkable aside from his previously abnormal alcohol value.  Patient has been seen and evaluated physical therapy who notes that the patient is unsafe for discharge with no capacity to safely ambulate himself.  On chart review is clear the patient was recently discharged to a nursing facility, and I discussed this case with our social work colleagues to facilitate this plan   Carmin Muskrat, MD 06/16/19 1919  10:42 PM Patient has been seen, evaluated by social work, case management.  I also discussed to their assessment of the patient with them.  Patient was noted to have previously left his nursing facility via elopement, and is unlikely to be placed again successfully from the emergency department.  Given this consideration, we discussed with the patient his situation, patient amenable to going home with home health services, wheelchair, medical bed, and absent other alarming findings, this is reasonable.    Carmin Muskrat, MD 06/16/19 2243

## 2019-06-16 NOTE — ED Notes (Signed)
Pt given phone and is speaking with friend.

## 2019-06-16 NOTE — NC FL2 (Signed)
[place] LEVEL OF CARE SCREENING TOOL     IDENTIFICATION  Patient Name: Austin Cobb Birthdate: 28-Feb-1961 Sex: male Admission Date (Current Location): 06/15/2019  [facility] and [place] Number:  Herbalist and Address:  [facility], [place] [address] [place], [place] [address]      Provider Number: [id number]  Attending Physician Name and Address:  No att. providers found  Relative Name and Phone Number:  Elfredia Nevins [phone]    Current Level of Care: Hospital Recommended Level of Care: [place] Prior Approval Number:    Date Approved/Denied:   PASRR Number: [id number]  Start 05/25/19 End 06/24/19  Discharge Plan: SNF    Current Diagnoses: Patient Active Problem List   Diagnosis Date Noted  . Heparin induced thrombocytopenia ([place]) 05/25/2019  . Protein-calorie malnutrition, severe 05/20/2019  . Yeast UTI 05/15/2019  . Alcohol withdrawal ([place]) 05/14/2019  . Acute metabolic encephalopathy [id number]  . AKI (acute kidney injury) ([place]) 05/14/2019  . Elevated d-dimer 05/14/2019  . Demand ischemia ([place]) 05/14/2019  . Elevated troponin 05/14/2019  . Pleural effusion 05/14/2019  . Polysubstance abuse ([place]) 05/14/2019  . E. coli UTI 05/14/2019  . Agitation 05/14/2019  . Noncompliance 05/14/2019  . Acute on chronic combined systolic and diastolic CHF (congestive heart failure) ([place])   . Atrial flutter with rapid ventricular response ([place]) 05/04/2019  . Acute heart failure ([place])   . Essential hypertension   . Herpes zoster without complication 0000000  . History of fall 07/23/2017  . Prostatitis 06/23/2014  . Serrated adenoma of colon 05/13/2012  . Anal fissure 05/13/2012  . Tobacco abuse 05/13/2012  . 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 Chronic shoulder pain 01/03/2012  . Neck pain, chronic 01/03/2012  . Mood disorder ([place]) 01/03/2012  . Depression with anxiety 01/03/2012     Orientation RESPIRATION BLADDER Height & Weight     Self, Time, Situation, Place  Normal Continent Weight:   Height:     BEHAVIORAL SYMPTOMS/MOOD NEUROLOGICAL BOWEL NUTRITION STATUS      Continent Diet, Supplemental(Regular/Ensure between meals)  AMBULATORY STATUS COMMUNICATION OF NEEDS Skin   Extensive Assist Verbally Normal                       Personal Care Assistance Level of Assistance  Bathing, Feeding, Dressing Bathing Assistance: Limited assistance Feeding assistance: Independent Dressing Assistance: Limited assistance     Functional Limitations Info  Sight, Hearing, Speech Sight Info: Impaired Hearing Info: Adequate Speech Info: Adequate    SPECIAL CARE FACTORS FREQUENCY  PT (By licensed PT), OT (By licensed OT)     PT Frequency: 5x weekly OT Frequency: 5x weekly            Contractures Contractures Info: Not present    Additional Factors Info  Code Status, Allergies Code Status Info: Full Allergies Info: Heparin           Current Medications (06/16/2019):  This is the current hospital active medication list Current Facility-Administered Medications  Medication Dose Route Frequency Provider Last Rate Last Admin  . amiodarone (PACERONE) tablet 200 mg  200 mg Oral BID Carmin Muskrat, MD      . apixaban Arne Cleveland) tablet 5 mg  5 mg Oral BID Carmin Muskrat, MD      . Derrill Memo ON 06/17/2019] carvedilol (COREG) tablet 3.125 mg  3.125 mg Oral BID WC Carmin Muskrat, MD      . chlordiazePOXIDE (LIBRIUM) capsule 25  mg  25 mg Oral Q6H PRN Carmin Muskrat, MD      . digoxin Fonnie Birkenhead) tablet 0.125 mg  0.125 mg Oral Daily Carmin Muskrat, MD      . feeding supplement (ENSURE ENLIVE) (ENSURE ENLIVE) liquid 237 mL  237 mL Oral TID BM Carmin Muskrat, MD      . hydrOXYzine (ATARAX/VISTARIL) tablet 25 mg  25 mg Oral Q6H PRN Carmin Muskrat, MD      . lactulose (CHRONULAC) 10 GM/15ML solution 20 g  20 g Oral BID Carmin Muskrat, MD      . loperamide  (IMODIUM) capsule 2-4 mg  2-4 mg Oral PRN Carmin Muskrat, MD      . multivitamin with minerals tablet 1 tablet  1 tablet Oral Daily Carmin Muskrat, MD      . ondansetron (ZOFRAN-ODT) disintegrating tablet 4 mg  4 mg Oral Q6H PRN Carmin Muskrat, MD      . sacubitril-valsartan (ENTRESTO) 24-26 mg per tablet  1 tablet Oral BID Carmin Muskrat, MD      . spironolactone (ALDACTONE) tablet 12.5 mg  12.5 mg Oral Daily Carmin Muskrat, MD      . thiamine (B-1) injection 100 mg  100 mg Intramuscular Once Carmin Muskrat, MD      . Derrill Memo ON 06/17/2019] thiamine tablet 100 mg  100 mg Oral Daily Carmin Muskrat, MD       Current Outpatient Medications  Medication Sig Dispense Refill  . amiodarone (PACERONE) 200 MG tablet Take 1 tablet (200 mg total) by mouth 2 (two) times daily.    Marland Kitchen apixaban (ELIQUIS) 5 MG TABS tablet Take 1 tablet (5 mg total) by mouth 2 (two) times daily. 60 tablet   . carvedilol (COREG) 3.125 MG tablet Take 1 tablet (3.125 mg total) by mouth 2 (two) times daily with a meal.    . digoxin (LANOXIN) 0.125 MG tablet Take 1 tablet (0.125 mg total) by mouth daily.    . feeding supplement, ENSURE ENLIVE, (ENSURE ENLIVE) LIQD Take 237 mLs by mouth in the morning, at noon, in the evening, and at bedtime. 237 mL 12  . lactulose (CHRONULAC) 10 GM/15ML solution Take 30 mLs (20 g total) by mouth 2 (two) times daily. 236 mL 0  . Multiple Vitamin (MULTIVITAMIN WITH MINERALS) TABS tablet Take 1 tablet by mouth daily.    . sacubitril-valsartan (ENTRESTO) 24-26 MG Take 1 tablet by mouth 2 (two) times daily. 60 tablet   . spironolactone (ALDACTONE) 25 MG tablet Take 0.5 tablets (12.5 mg total) by mouth daily.       Discharge Medications: Please see discharge summary for a list of discharge medications.  Relevant Imaging Results:  Relevant Lab Results:   Additional Information [phone]  Vergie Living, LCSW

## 2019-06-16 NOTE — TOC Initial Note (Signed)
Transition of Care [facility]) - Initial/Assessment Note    Patient Details  Name: Austin Cobb MRN: [id number] Date of Birth: 1960/08/20  [facility]) CM/SW Contact:    Vergie Living, LCSW Phone Number: 06/16/2019, 8:46 PM  Clinical Narrative:  Pt is 59 y/o A&Ox4. Pt showed signs of agitation, unable to give accurate history. Pt gave verbal permission to contact roommate, Dannielle Burn @ [phone] to discuss SNF placement. Roommate spontaneously gave history of Pt eloping from last SNF placement after 4 days.                 Expected Discharge Plan: Skilled Nursing Facility Barriers to Discharge: No Barriers Identified   Patient Goals and CMS Choice Patient states their goals for this hospitalization and ongoing recovery are:: Feel better and to walk again CMS Medicare.gov Compare Post Acute Care list provided to:: Patient Choice offered to / list presented to : Patient  Expected Discharge Plan and Services Expected Discharge Plan: [place] arrangements for the past 2 months: Mobile Home                                      Prior Living Arrangements/Services Living arrangements for the past 2 months: Mobile Home Lives with:: Roommate Patient language and need for interpreter reviewed:: No Do you feel safe going back to the place where you live?: Yes      Need for Family Participation in Patient Care: Yes (Comment) Care giver support system in place?: Yes (comment)(Roommate [name])   Criminal Activity/Legal Involvement Pertinent to Current Situation/Hospitalization: No - Comment as needed  Activities of Daily Living      Permission Sought/Granted Permission sought to share information with : Other (comment)(Roommate Emeline Darling) Permission granted to share information with : Yes, Verbal Permission Granted  Share Information with NAME: [name]        Permission granted to share info w Contact Information: Lurline Hare [phone]  Emotional Assessment Appearance:: Appears stated age Attitude/Demeanor/Rapport: Avoidant Affect (typically observed): Agitated Orientation: : Oriented to Self, Oriented to Place, Oriented to  Time, Oriented to Situation Alcohol / Substance Use: Alcohol Use, Illicit Drugs Psych Involvement: No (comment)  Admission diagnosis:  AMS  Patient Active Problem List   Diagnosis Date Noted  . Heparin induced thrombocytopenia ([place]) 05/25/2019  . Protein-calorie malnutrition, severe 05/20/2019  . Yeast UTI 05/15/2019  . Alcohol withdrawal ([place]) 05/14/2019  . Acute metabolic encephalopathy [id number]  . AKI (acute kidney injury) ([place]) 05/14/2019  . Elevated d-dimer 05/14/2019  . Demand ischemia ([place]) 05/14/2019  . Elevated troponin 05/14/2019  . Pleural effusion 05/14/2019  . Polysubstance abuse ([place]) 05/14/2019  . E. coli UTI 05/14/2019  . Agitation 05/14/2019  . Noncompliance 05/14/2019  . Acute on chronic combined systolic and diastolic CHF (congestive heart failure) ([place])   . Atrial flutter with rapid ventricular response ([place]) 05/04/2019  . Acute heart failure ([place])   . Essential hypertension   . Herpes zoster without complication 0000000  . History of fall 07/23/2017  . Prostatitis 06/23/2014  . Serrated adenoma of colon 05/13/2012  . Anal fissure 05/13/2012  . Tobacco abuse 05/13/2012  . 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 Chronic shoulder pain 01/03/2012  . Neck pain, chronic 01/03/2012  . Mood disorder ([place]) 01/03/2012  . Depression with anxiety 01/03/2012   PCP:  Patient, No Pcp Per  Pharmacy:   [place], [address] [place]. [place] [address] Phone: [phone] Fax: [phone]  [facility] [address], [place] [address] [place] [address] Phone: [phone] Fax: [phone]  [facility] [phone] [place], [place] [address] Phone: [phone] Fax: [phone]  [facility] [phone] - [place], [place] - [address] [place] [address] Phone: [phone] Fax: [phone]  [facility] [place], [place] - [address] [place] [address] Phone: [phone] Fax: [phone]     Social Determinants of Health (SDOH) Interventions    Readmission Risk Interventions No flowsheet data found.

## 2019-06-17 ENCOUNTER — Telehealth: Payer: Self-pay | Admitting: Surgery

## 2019-06-17 DIAGNOSIS — I10 Essential (primary) hypertension: Secondary | ICD-10-CM | POA: Diagnosis not present

## 2019-06-17 DIAGNOSIS — F19939 Other psychoactive substance use, unspecified with withdrawal, unspecified: Secondary | ICD-10-CM | POA: Diagnosis not present

## 2019-06-17 DIAGNOSIS — M2550 Pain in unspecified joint: Secondary | ICD-10-CM | POA: Diagnosis not present

## 2019-06-17 DIAGNOSIS — F29 Unspecified psychosis not due to a substance or known physiological condition: Secondary | ICD-10-CM | POA: Diagnosis not present

## 2019-06-17 DIAGNOSIS — Z7401 Bed confinement status: Secondary | ICD-10-CM | POA: Diagnosis not present

## 2019-06-17 LAB — SARS CORONAVIRUS 2 (TAT 6-24 HRS): SARS Coronavirus 2: NEGATIVE

## 2019-06-17 NOTE — Telephone Encounter (Signed)
ED TOC received call from patient stating he has not received a call from  [facility] concerning his hospital bed and w/c  CM contacted liaison Zack to follow up.

## 2019-06-17 NOTE — ED Notes (Signed)
Patient verbalizes understanding of discharge instructions. Opportunity for questioning and answers were provided. Armband removed by staff, pt discharged from ED via [facility]

## 2019-06-18 ENCOUNTER — Telehealth: Payer: Self-pay

## 2019-06-18 NOTE — Progress Notes (Signed)
Called again spoke to [employer]. She was finishing up a procedure and asked if we could call back in a hour. Handoff given to social work [employer]

## 2019-06-22 DIAGNOSIS — M6281 Muscle weakness (generalized): Secondary | ICD-10-CM | POA: Diagnosis not present

## 2019-07-22 DIAGNOSIS — M6281 Muscle weakness (generalized): Secondary | ICD-10-CM | POA: Diagnosis not present

## 2019-08-22 DIAGNOSIS — M6281 Muscle weakness (generalized): Secondary | ICD-10-CM | POA: Diagnosis not present

## 2019-09-06 ENCOUNTER — Encounter (HOSPITAL_COMMUNITY): Payer: Self-pay | Admitting: Emergency Medicine

## 2019-09-06 ENCOUNTER — Emergency Department (HOSPITAL_COMMUNITY)
Admission: EM | Admit: 2019-09-06 | Discharge: 2019-09-06 | Disposition: A | Discharge status: Home / Self Care | Payer: Medicare PPO | Attending: Emergency Medicine | Admitting: Emergency Medicine

## 2019-09-06 ENCOUNTER — Other Ambulatory Visit: Payer: Self-pay

## 2019-09-06 DIAGNOSIS — F1020 Alcohol dependence, uncomplicated: Secondary | ICD-10-CM | POA: Insufficient documentation

## 2019-09-06 DIAGNOSIS — Z5321 Procedure and treatment not carried out due to patient leaving prior to being seen by health care provider: Secondary | ICD-10-CM | POA: Diagnosis not present

## 2019-09-06 DIAGNOSIS — R55 Syncope and collapse: Secondary | ICD-10-CM | POA: Diagnosis not present

## 2019-09-06 DIAGNOSIS — R531 Weakness: Secondary | ICD-10-CM | POA: Diagnosis not present

## 2019-09-06 DIAGNOSIS — R42 Dizziness and giddiness: Secondary | ICD-10-CM | POA: Diagnosis not present

## 2019-09-06 DIAGNOSIS — R61 Generalized hyperhidrosis: Secondary | ICD-10-CM | POA: Diagnosis not present

## 2019-09-06 LAB — BASIC METABOLIC PANEL
Anion gap: 11 (ref 5–15)
BUN: 20 mg/dL (ref 6–20)
CO2: 24 mmol/L (ref 22–32)
Calcium: 8.5 mg/dL — ABNORMAL LOW (ref 8.9–10.3)
Chloride: 103 mmol/L (ref 98–111)
Creatinine, Ser: 1.51 mg/dL — ABNORMAL HIGH (ref 0.61–1.24)
GFR calc Af Amer: 57 mL/min — ABNORMAL LOW (ref >60)
GFR calc non Af Amer: 49 mL/min — ABNORMAL LOW (ref >60)
Glucose, Bld: 201 mg/dL — ABNORMAL HIGH (ref 70–99)
Potassium: 3.8 mmol/L (ref 3.5–5.1)
Sodium: 138 mmol/L (ref 135–145)

## 2019-09-06 LAB — CBC
HCT: 37.4 % — ABNORMAL LOW (ref 39.0–52.0)
Hemoglobin: 12.4 g/dL — ABNORMAL LOW (ref 13.0–17.0)
MCH: 31.7 pg (ref 26.0–34.0)
MCHC: 33.2 g/dL (ref 30.0–36.0)
MCV: 95.7 fL (ref 80.0–100.0)
Platelets: 147 10*3/uL — ABNORMAL LOW (ref 150–400)
RBC: 3.91 MIL/uL — ABNORMAL LOW (ref 4.22–5.81)
RDW: 14.2 % (ref 11.5–15.5)
WBC: 7.1 10*3/uL (ref 4.0–10.5)
nRBC: 0 % (ref 0.0–0.2)

## 2019-09-06 MED ORDER — SODIUM CHLORIDE 0.9% FLUSH: 3.0000 mL | Freq: Once | INTRAVENOUS | Status: DC

## 2019-09-06 NOTE — ED Notes (Signed)
Patient no longer wanted to wait, states he 'feels better', and has left.

## 2019-09-06 NOTE — ED Triage Notes (Signed)
Patient arrives to ED triage via EMS with complaints of a near syncopal episode this afternoon. Patient states he is an alcoholic and tried to stop drinking this past Friday. Didn't drink Saturday or Sunday but had two sips of beer today. Patient states that after he drank he got diaphoretic and dizzy and about passed out. When EMS arrived they gave patient 500cc fluid. GCS 15. VSS.

## 2019-09-21 DIAGNOSIS — M6281 Muscle weakness (generalized): Secondary | ICD-10-CM | POA: Diagnosis not present

## 2019-10-22 DIAGNOSIS — M6281 Muscle weakness (generalized): Secondary | ICD-10-CM | POA: Diagnosis not present

## 2019-11-22 DIAGNOSIS — M6281 Muscle weakness (generalized): Secondary | ICD-10-CM | POA: Diagnosis not present

## 2019-12-22 DIAGNOSIS — M6281 Muscle weakness (generalized): Secondary | ICD-10-CM | POA: Diagnosis not present

## 2019-12-24 ENCOUNTER — Other Ambulatory Visit: Payer: Self-pay

## 2019-12-24 ENCOUNTER — Inpatient Hospital Stay (HOSPITAL_COMMUNITY)
Admission: EM | Admit: 2019-12-24 | Discharge: 2019-12-30 | DRG: 292 | Disposition: A | Discharge status: Home / Self Care | Payer: Medicare PPO | Attending: Internal Medicine | Admitting: Internal Medicine

## 2019-12-24 ENCOUNTER — Emergency Department (HOSPITAL_COMMUNITY): Payer: Medicare PPO

## 2019-12-24 ENCOUNTER — Encounter (HOSPITAL_COMMUNITY): Payer: Self-pay

## 2019-12-24 DIAGNOSIS — I2510 Atherosclerotic heart disease of native coronary artery without angina pectoris: Secondary | ICD-10-CM | POA: Diagnosis present

## 2019-12-24 DIAGNOSIS — I428 Other cardiomyopathies: Secondary | ICD-10-CM | POA: Diagnosis not present

## 2019-12-24 DIAGNOSIS — E876 Hypokalemia: Secondary | ICD-10-CM | POA: Diagnosis not present

## 2019-12-24 DIAGNOSIS — R8281 Pyuria: Secondary | ICD-10-CM | POA: Diagnosis not present

## 2019-12-24 DIAGNOSIS — I4892 Unspecified atrial flutter: Secondary | ICD-10-CM | POA: Diagnosis present

## 2019-12-24 DIAGNOSIS — Z9114 Patient's other noncompliance with medication regimen: Secondary | ICD-10-CM

## 2019-12-24 DIAGNOSIS — Z23 Encounter for immunization: Secondary | ICD-10-CM

## 2019-12-24 DIAGNOSIS — N1831 Chronic kidney disease, stage 3a: Secondary | ICD-10-CM | POA: Diagnosis not present

## 2019-12-24 DIAGNOSIS — Z87440 Personal history of urinary (tract) infections: Secondary | ICD-10-CM

## 2019-12-24 DIAGNOSIS — N179 Acute kidney failure, unspecified: Secondary | ICD-10-CM | POA: Diagnosis not present

## 2019-12-24 DIAGNOSIS — I110 Hypertensive heart disease with heart failure: Secondary | ICD-10-CM | POA: Diagnosis not present

## 2019-12-24 DIAGNOSIS — D696 Thrombocytopenia, unspecified: Secondary | ICD-10-CM | POA: Diagnosis present

## 2019-12-24 DIAGNOSIS — J90 Pleural effusion, not elsewhere classified: Secondary | ICD-10-CM

## 2019-12-24 DIAGNOSIS — F319 Bipolar disorder, unspecified: Secondary | ICD-10-CM | POA: Diagnosis present

## 2019-12-24 DIAGNOSIS — F10939 Alcohol use, unspecified with withdrawal, unspecified: Secondary | ICD-10-CM | POA: Diagnosis present

## 2019-12-24 DIAGNOSIS — K838 Other specified diseases of biliary tract: Secondary | ICD-10-CM | POA: Diagnosis present

## 2019-12-24 DIAGNOSIS — Z91048 Other nonmedicinal substance allergy status: Secondary | ICD-10-CM

## 2019-12-24 DIAGNOSIS — Z8619 Personal history of other infectious and parasitic diseases: Secondary | ICD-10-CM | POA: Diagnosis not present

## 2019-12-24 DIAGNOSIS — K7460 Unspecified cirrhosis of liver: Secondary | ICD-10-CM | POA: Diagnosis not present

## 2019-12-24 DIAGNOSIS — K7030 Alcoholic cirrhosis of liver without ascites: Secondary | ICD-10-CM | POA: Diagnosis not present

## 2019-12-24 DIAGNOSIS — F1490 Cocaine use, unspecified, uncomplicated: Secondary | ICD-10-CM | POA: Diagnosis present

## 2019-12-24 DIAGNOSIS — R300 Dysuria: Secondary | ICD-10-CM | POA: Diagnosis not present

## 2019-12-24 DIAGNOSIS — R64 Cachexia: Secondary | ICD-10-CM | POA: Diagnosis present

## 2019-12-24 DIAGNOSIS — I5021 Acute systolic (congestive) heart failure: Secondary | ICD-10-CM | POA: Diagnosis not present

## 2019-12-24 DIAGNOSIS — Z20822 Contact with and (suspected) exposure to covid-19: Secondary | ICD-10-CM | POA: Diagnosis present

## 2019-12-24 DIAGNOSIS — I5082 Biventricular heart failure: Secondary | ICD-10-CM | POA: Diagnosis present

## 2019-12-24 DIAGNOSIS — B9689 Other specified bacterial agents as the cause of diseases classified elsewhere: Secondary | ICD-10-CM | POA: Diagnosis not present

## 2019-12-24 DIAGNOSIS — I5023 Acute on chronic systolic (congestive) heart failure: Secondary | ICD-10-CM | POA: Diagnosis not present

## 2019-12-24 DIAGNOSIS — Z79899 Other long term (current) drug therapy: Secondary | ICD-10-CM

## 2019-12-24 DIAGNOSIS — F419 Anxiety disorder, unspecified: Secondary | ICD-10-CM | POA: Diagnosis not present

## 2019-12-24 DIAGNOSIS — D649 Anemia, unspecified: Secondary | ICD-10-CM | POA: Diagnosis present

## 2019-12-24 DIAGNOSIS — Z7982 Long term (current) use of aspirin: Secondary | ICD-10-CM

## 2019-12-24 DIAGNOSIS — F10239 Alcohol dependence with withdrawal, unspecified: Secondary | ICD-10-CM | POA: Diagnosis not present

## 2019-12-24 DIAGNOSIS — N390 Urinary tract infection, site not specified: Secondary | ICD-10-CM | POA: Diagnosis not present

## 2019-12-24 DIAGNOSIS — I5043 Acute on chronic combined systolic (congestive) and diastolic (congestive) heart failure: Secondary | ICD-10-CM | POA: Diagnosis not present

## 2019-12-24 DIAGNOSIS — R0902 Hypoxemia: Secondary | ICD-10-CM | POA: Diagnosis not present

## 2019-12-24 DIAGNOSIS — R000 Tachycardia, unspecified: Secondary | ICD-10-CM | POA: Diagnosis not present

## 2019-12-24 DIAGNOSIS — M1990 Unspecified osteoarthritis, unspecified site: Secondary | ICD-10-CM | POA: Diagnosis present

## 2019-12-24 DIAGNOSIS — Z7901 Long term (current) use of anticoagulants: Secondary | ICD-10-CM

## 2019-12-24 DIAGNOSIS — K219 Gastro-esophageal reflux disease without esophagitis: Secondary | ICD-10-CM | POA: Diagnosis present

## 2019-12-24 DIAGNOSIS — Z888 Allergy status to other drugs, medicaments and biological substances status: Secondary | ICD-10-CM

## 2019-12-24 DIAGNOSIS — I5042 Chronic combined systolic (congestive) and diastolic (congestive) heart failure: Secondary | ICD-10-CM | POA: Diagnosis present

## 2019-12-24 DIAGNOSIS — J9811 Atelectasis: Secondary | ICD-10-CM | POA: Diagnosis not present

## 2019-12-24 DIAGNOSIS — B9620 Unspecified Escherichia coli [E. coli] as the cause of diseases classified elsewhere: Secondary | ICD-10-CM | POA: Diagnosis present

## 2019-12-24 DIAGNOSIS — I509 Heart failure, unspecified: Secondary | ICD-10-CM

## 2019-12-24 DIAGNOSIS — K7010 Alcoholic hepatitis without ascites: Secondary | ICD-10-CM | POA: Diagnosis not present

## 2019-12-24 DIAGNOSIS — Z720 Tobacco use: Secondary | ICD-10-CM | POA: Diagnosis present

## 2019-12-24 DIAGNOSIS — F17210 Nicotine dependence, cigarettes, uncomplicated: Secondary | ICD-10-CM | POA: Diagnosis present

## 2019-12-24 DIAGNOSIS — I248 Other forms of acute ischemic heart disease: Secondary | ICD-10-CM | POA: Diagnosis present

## 2019-12-24 DIAGNOSIS — I4891 Unspecified atrial fibrillation: Secondary | ICD-10-CM | POA: Diagnosis not present

## 2019-12-24 DIAGNOSIS — K920 Hematemesis: Secondary | ICD-10-CM | POA: Diagnosis not present

## 2019-12-24 DIAGNOSIS — R0689 Other abnormalities of breathing: Secondary | ICD-10-CM | POA: Diagnosis not present

## 2019-12-24 DIAGNOSIS — F1910 Other psychoactive substance abuse, uncomplicated: Secondary | ICD-10-CM | POA: Diagnosis present

## 2019-12-24 DIAGNOSIS — K7689 Other specified diseases of liver: Secondary | ICD-10-CM | POA: Diagnosis not present

## 2019-12-24 DIAGNOSIS — R0602 Shortness of breath: Secondary | ICD-10-CM | POA: Diagnosis not present

## 2019-12-24 DIAGNOSIS — I517 Cardiomegaly: Secondary | ICD-10-CM | POA: Diagnosis not present

## 2019-12-24 LAB — COMPREHENSIVE METABOLIC PANEL
ALT: 270 U/L — ABNORMAL HIGH (ref 0–44)
AST: 405 U/L — ABNORMAL HIGH (ref 15–41)
Albumin: 3.1 g/dL — ABNORMAL LOW (ref 3.5–5.0)
Alkaline Phosphatase: 97 U/L (ref 38–126)
Anion gap: 11 (ref 5–15)
BUN: 22 mg/dL — ABNORMAL HIGH (ref 6–20)
CO2: 19 mmol/L — ABNORMAL LOW (ref 22–32)
Calcium: 8.4 mg/dL — ABNORMAL LOW (ref 8.9–10.3)
Chloride: 104 mmol/L (ref 98–111)
Creatinine, Ser: 1.53 mg/dL — ABNORMAL HIGH (ref 0.61–1.24)
GFR, Estimated: 49 mL/min — ABNORMAL LOW (ref >60)
Glucose, Bld: 151 mg/dL — ABNORMAL HIGH (ref 70–99)
Potassium: 4 mmol/L (ref 3.5–5.1)
Sodium: 134 mmol/L — ABNORMAL LOW (ref 135–145)
Total Bilirubin: 2 mg/dL — ABNORMAL HIGH (ref 0.3–1.2)
Total Protein: 5.9 g/dL — ABNORMAL LOW (ref 6.5–8.1)

## 2019-12-24 LAB — CBC WITH DIFFERENTIAL/PLATELET
Abs Immature Granulocytes: 0.03 10*3/uL (ref 0.00–0.07)
Basophils Absolute: 0.1 10*3/uL (ref 0.0–0.1)
Basophils Relative: 2 %
Eosinophils Absolute: 0.1 10*3/uL (ref 0.0–0.5)
Eosinophils Relative: 2 %
HCT: 33.8 % — ABNORMAL LOW (ref 39.0–52.0)
Hemoglobin: 11.1 g/dL — ABNORMAL LOW (ref 13.0–17.0)
Immature Granulocytes: 0 %
Lymphocytes Relative: 26 %
Lymphs Abs: 1.8 10*3/uL (ref 0.7–4.0)
MCH: 30.8 pg (ref 26.0–34.0)
MCHC: 32.8 g/dL (ref 30.0–36.0)
MCV: 93.9 fL (ref 80.0–100.0)
Monocytes Absolute: 0.6 10*3/uL (ref 0.1–1.0)
Monocytes Relative: 9 %
Neutro Abs: 4.1 10*3/uL (ref 1.7–7.7)
Neutrophils Relative %: 61 %
Platelets: 139 10*3/uL — ABNORMAL LOW (ref 150–400)
RBC: 3.6 MIL/uL — ABNORMAL LOW (ref 4.22–5.81)
RDW: 14.6 % (ref 11.5–15.5)
WBC: 6.8 10*3/uL (ref 4.0–10.5)
nRBC: 0.4 % — ABNORMAL HIGH (ref 0.0–0.2)

## 2019-12-24 LAB — RESPIRATORY PANEL BY RT PCR (FLU A&B, COVID)
Influenza A by PCR: NEGATIVE
Influenza B by PCR: NEGATIVE
SARS Coronavirus 2 by RT PCR: NEGATIVE

## 2019-12-24 LAB — TROPONIN I (HIGH SENSITIVITY)
Troponin I (High Sensitivity): 41 ng/L — ABNORMAL HIGH (ref <18)
Troponin I (High Sensitivity): 41 ng/L — ABNORMAL HIGH (ref <18)

## 2019-12-24 LAB — BRAIN NATRIURETIC PEPTIDE: B Natriuretic Peptide: 2812.2 pg/mL — ABNORMAL HIGH (ref 0.0–100.0)

## 2019-12-24 LAB — LIPASE, BLOOD: Lipase: 24 U/L (ref 11–51)

## 2019-12-24 MED ORDER — SPIRONOLACTONE 12.5 MG HALF TABLET
12.5000 mg | ORAL_TABLET | Freq: Every day | ORAL | Status: DC
  Administered 2019-12-25 – 2019-12-30 (×6): 12.5 mg via ORAL
  Filled 2019-12-24 (×6): qty 1

## 2019-12-24 MED ORDER — LACTATED RINGERS IV SOLN: INTRAVENOUS | Status: DC

## 2019-12-24 MED ORDER — APIXABAN 5 MG PO TABS: 5.0000 mg | ORAL_TABLET | Freq: Two times a day (BID) | ORAL | Status: DC

## 2019-12-24 MED ORDER — DIGOXIN 0.25 MG/ML IJ SOLN
0.5000 mg | Freq: Once | INTRAMUSCULAR | Status: AC
  Administered 2019-12-24: 0.5 mg via INTRAVENOUS
  Filled 2019-12-24: qty 2

## 2019-12-24 MED ORDER — THIAMINE HCL 100 MG PO TABS
100.0000 mg | ORAL_TABLET | Freq: Every day | ORAL | Status: DC
  Administered 2019-12-24 – 2019-12-30 (×6): 100 mg via ORAL
  Filled 2019-12-24 (×6): qty 1

## 2019-12-24 MED ORDER — ASPIRIN EC 81 MG PO TBEC
81.0000 mg | DELAYED_RELEASE_TABLET | Freq: Every day | ORAL | Status: DC
  Administered 2019-12-25: 81 mg via ORAL
  Filled 2019-12-24 (×2): qty 1

## 2019-12-24 MED ORDER — FOLIC ACID 1 MG PO TABS
1.0000 mg | ORAL_TABLET | Freq: Every day | ORAL | Status: DC
  Administered 2019-12-24 – 2019-12-30 (×7): 1 mg via ORAL
  Filled 2019-12-24 (×7): qty 1

## 2019-12-24 MED ORDER — LORAZEPAM 2 MG/ML IJ SOLN: 1.0000 mg | INTRAMUSCULAR | Status: DC | PRN

## 2019-12-24 MED ORDER — ONDANSETRON HCL 4 MG/2ML IJ SOLN
4.0000 mg | Freq: Four times a day (QID) | INTRAMUSCULAR | Status: DC | PRN
  Administered 2019-12-25: 4 mg via INTRAVENOUS
  Filled 2019-12-24: qty 2

## 2019-12-24 MED ORDER — APIXABAN 5 MG PO TABS
5.0000 mg | ORAL_TABLET | Freq: Two times a day (BID) | ORAL | Status: DC
  Administered 2019-12-24 – 2019-12-30 (×12): 5 mg via ORAL
  Filled 2019-12-24 (×12): qty 1

## 2019-12-24 MED ORDER — FUROSEMIDE 8 MG/ML PO SOLN: 40.0000 mg | Freq: Once | ORAL | Status: DC

## 2019-12-24 MED ORDER — FUROSEMIDE 10 MG/ML PO SOLN
40.0000 mg | Freq: Once | ORAL | Status: AC
  Administered 2019-12-24: 40 mg via ORAL
  Filled 2019-12-24: qty 4

## 2019-12-24 MED ORDER — DIGOXIN 0.25 MG/ML IJ SOLN
0.2500 mg | Freq: Once | INTRAMUSCULAR | Status: AC
  Administered 2019-12-25: 0.25 mg via INTRAVENOUS
  Filled 2019-12-24 (×2): qty 2

## 2019-12-24 MED ORDER — SODIUM CHLORIDE 0.9% FLUSH: 3.0000 mL | INTRAVENOUS | Status: DC | PRN

## 2019-12-24 MED ORDER — THIAMINE HCL 100 MG/ML IJ SOLN
100.0000 mg | Freq: Every day | INTRAMUSCULAR | Status: DC
  Administered 2019-12-27: 100 mg via INTRAVENOUS
  Filled 2019-12-24 (×4): qty 2

## 2019-12-24 MED ORDER — SACUBITRIL-VALSARTAN 24-26 MG PO TABS
1.0000 | ORAL_TABLET | Freq: Two times a day (BID) | ORAL | Status: DC
  Administered 2019-12-24 – 2019-12-30 (×12): 1 via ORAL
  Filled 2019-12-24 (×13): qty 1

## 2019-12-24 MED ORDER — ADULT MULTIVITAMIN W/MINERALS CH
1.0000 | ORAL_TABLET | Freq: Every day | ORAL | Status: DC
  Administered 2019-12-24 – 2019-12-30 (×7): 1 via ORAL
  Filled 2019-12-24 (×7): qty 1

## 2019-12-24 MED ORDER — SODIUM CHLORIDE 0.9% FLUSH
3.0000 mL | Freq: Two times a day (BID) | INTRAVENOUS | Status: DC
  Administered 2019-12-24 – 2019-12-30 (×9): 3 mL via INTRAVENOUS

## 2019-12-24 MED ORDER — SODIUM CHLORIDE 0.9 % IV SOLN: 250.0000 mL | INTRAVENOUS | Status: DC | PRN

## 2019-12-24 MED ORDER — LORAZEPAM 1 MG PO TABS
1.0000 mg | ORAL_TABLET | ORAL | Status: DC | PRN
  Administered 2019-12-24: 1 mg via ORAL
  Filled 2019-12-24: qty 1

## 2019-12-24 MED ORDER — SODIUM CHLORIDE 0.9 % IV BOLUS
500.0000 mL | Freq: Once | INTRAVENOUS | Status: AC
  Administered 2019-12-24: 500 mL via INTRAVENOUS

## 2019-12-24 MED ORDER — FUROSEMIDE 10 MG/ML PO SOLN
40.0000 mg | Freq: Once | ORAL | Status: AC
  Administered 2019-12-24: 40 mg via ORAL
  Filled 2019-12-24 (×2): qty 4

## 2019-12-24 MED ORDER — DIGOXIN 0.25 MG/ML IJ SOLN
0.2500 mg | Freq: Once | INTRAMUSCULAR | Status: AC
  Administered 2019-12-25: 0.25 mg via INTRAVENOUS
  Filled 2019-12-24: qty 2

## 2019-12-24 NOTE — ED Provider Notes (Signed)
[facility] EMERGENCY DEPARTMENT Provider Note   CSN: [id number] Arrival date & time: 12/24/19  1310     History No chief complaint on file.   Austin Cobb is a 59 y.o. male.  The history is provided by the patient. No language interpreter was used.  Shortness of Breath Severity:  Moderate Onset quality:  Gradual Duration:  4 weeks Timing:  Constant Progression:  Worsening Chronicity:  New Context: activity   Relieved by:  Nothing Worsened by:  Activity Ineffective treatments:  None tried Associated symptoms: no chest pain   Risk factors: alcohol use    Pt has a history of CHF.  He is not currently seeing a provider of taking prescriptions.  Pt took an asa, b vit and multi vitamin today.  Pt reports increased shortness of breath.      Past Medical History:  Diagnosis Date  . Anxiety   . Arthritis   . Bipolar 1 disorder ([place])   . Depression    Bipolar, not on meds/pt. stopped meds on own over 12 yrs ago  . GERD (gastroesophageal reflux disease)   . History of colon polyps   . Manic disorder ([place])   . Noncompliance 05/14/2019  . Substance abuse ([place])    8yrs ago smoke crack and marjuana  . Tobacco abuse 05/13/2012    Patient Active Problem List   Diagnosis Date Noted  . Heparin induced thrombocytopenia ([place]) 05/25/2019  . Protein-calorie malnutrition, severe 05/20/2019  . Yeast UTI 05/15/2019  . Alcohol withdrawal ([place]) 05/14/2019  . Acute metabolic encephalopathy 17/49/4496  . AKI (acute kidney injury) ([place]) 05/14/2019  . Elevated d-dimer 05/14/2019  . Demand ischemia ([place]) 05/14/2019  . Elevated troponin 05/14/2019  . Pleural effusion 05/14/2019  . Polysubstance abuse ([place]) 05/14/2019  . E. coli UTI 05/14/2019  . Agitation 05/14/2019  . Noncompliance 05/14/2019  . Acute on chronic combined systolic and diastolic CHF (congestive heart failure) ([place])   . Atrial flutter with rapid ventricular response ([place]) 05/04/2019  . Acute heart  failure ([place])   . Essential hypertension   . Herpes zoster without complication 75/91/6384  . History of fall 07/23/2017  . Prostatitis 06/23/2014  . Serrated adenoma of colon 05/13/2012  . Anal fissure 05/13/2012  . Tobacco abuse 05/13/2012  . 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 Chronic shoulder pain 01/03/2012  . Neck pain, chronic 01/03/2012  . Mood disorder ([place]) 01/03/2012  . Depression with anxiety 01/03/2012    Past Surgical History:  Procedure Laterality Date  . CARDIOVERSION N/A 05/06/2019   Procedure: CARDIOVERSION;  Surgeon: Acie Fredrickson Wonda Cheng, MD;  Location: [facility] ENDOSCOPY;  Service: Cardiovascular;  Laterality: N/A;  . CARDIOVERSION N/A 05/20/2019   Procedure: CARDIOVERSION;  Surgeon: Larey Dresser, MD;  Location: [facility] ENDOSCOPY;  Service: Cardiovascular;  Laterality: N/A;  . EXAMINATION UNDER ANESTHESIA N/A 11/12/2012   Procedure: EXAM UNDER ANESTHESIA;  Surgeon: Adin Hector, MD;  Location: [facility] ORS;  Service: General;  Laterality: N/A;  . EYE SURGERY     bilateral eye sx as a child 6-9yrs old.  Marland Kitchen RIGHT/LEFT HEART CATH AND CORONARY ANGIOGRAPHY N/A 05/18/2019   Procedure: RIGHT/LEFT HEART CATH AND CORONARY ANGIOGRAPHY;  Surgeon: Larey Dresser, MD;  Location: [facility] CV LAB;  Service: Cardiovascular;  Laterality: N/A;  . SPHINCTEROTOMY N/A 11/12/2012   Procedure: SPHINCTEROTOMY;  Surgeon: Adin Hector, MD;  Location: [facility] ORS;  Service: General;  Laterality: N/A;  . TEE WITHOUT CARDIOVERSION N/A 05/06/2019   Procedure: TRANSESOPHAGEAL  ECHOCARDIOGRAM (TEE);  Surgeon: Acie Fredrickson Wonda Cheng, MD;  Location: [facility];  Service: Cardiovascular;  Laterality: N/A;  . TEE WITHOUT CARDIOVERSION N/A 05/20/2019   Procedure: TRANSESOPHAGEAL ECHOCARDIOGRAM (TEE);  Surgeon: Larey Dresser, MD;  Location: [facility] ENDOSCOPY;  Service: Cardiovascular;  Laterality: N/A;  . TONSILLECTOMY         Family History  Problem Relation Age of Onset  . Colon polyps Father   . Diabetes Father    . Diabetes Brother     Social History   Tobacco Use  . Smoking status: Current Every Day Smoker    Packs/day: 1.00    Years: 35.00    Pack years: 35.00    Types: Cigarettes  . Smokeless tobacco: Never Used  Substance Use Topics  . Alcohol use: Yes    Alcohol/week: 4.0 standard drinks    Types: 4 Cans of beer per week  . Drug use: Yes    Types: Cocaine, Marijuana    Comment: Quit x6 yrs ago-     Home Medications Prior to Admission medications   Medication Sig Start Date End Date Taking? Authorizing Provider  aspirin EC 81 MG tablet Take 81 mg by mouth daily.    Yes [provider]  cholecalciferol (VITAMIN D3) 25 MCG (1000 UNIT) tablet Take 2,000 Units by mouth daily.   Yes [provider]  ferrous sulfate 324 MG TBEC Take 324 mg by mouth daily with breakfast.   Yes [provider]  amiodarone (PACERONE) 200 MG tablet Take 1 tablet (200 mg total) by mouth 2 (two) times daily. 05/25/19   Charlynne Cousins, MD  apixaban (ELIQUIS) 5 MG TABS tablet Take 1 tablet (5 mg total) by mouth 2 (two) times daily. 05/25/19   Charlynne Cousins, MD  carvedilol (COREG) 3.125 MG tablet Take 1 tablet (3.125 mg total) by mouth 2 (two) times daily with a meal. 05/25/19   Charlynne Cousins, MD  digoxin (LANOXIN) 0.125 MG tablet Take 1 tablet (0.125 mg total) by mouth daily. 05/25/19   Charlynne Cousins, MD  feeding supplement, ENSURE ENLIVE, (ENSURE ENLIVE) LIQD Take 237 mLs by mouth in the morning, at noon, in the evening, and at bedtime. 05/25/19   Charlynne Cousins, MD  lactulose (CHRONULAC) 10 GM/15ML solution Take 30 mLs (20 g total) by mouth 2 (two) times daily. 05/25/19   Charlynne Cousins, MD  Multiple Vitamin (MULTIVITAMIN WITH MINERALS) TABS tablet Take 1 tablet by mouth daily. 05/25/19   Charlynne Cousins, MD  sacubitril-valsartan (ENTRESTO) 24-26 MG Take 1 tablet by mouth 2 (two) times daily. 05/25/19   Charlynne Cousins, MD  spironolactone  (ALDACTONE) 25 MG tablet Take 0.5 tablets (12.5 mg total) by mouth daily. 05/27/19   Florencia Reasons, MD    Allergies    Adhesive [tape] and Heparin  Review of Systems   Review of Systems  Respiratory: Positive for shortness of breath.   Cardiovascular: Negative for chest pain.  All other systems reviewed and are negative.   Physical Exam Updated Vital Signs BP (!) 160/125   Pulse (!) 132   Temp 99.4 F (37.4 C) (Oral)   Resp (!) 26   Ht 5\' 10"  (1.778 m)   Wt 61.2 kg   SpO2 100%   BMI 19.37 kg/m   Physical Exam Vitals and nursing note reviewed.  Constitutional:      Appearance: He is well-developed.  HENT:     Head: Normocephalic and atraumatic.  Eyes:  Conjunctiva/sclera: Conjunctivae normal.  Cardiovascular:     Rate and Rhythm: Normal rate and regular rhythm.     Heart sounds: No murmur heard.   Pulmonary:     Effort: Pulmonary effort is normal. No respiratory distress.     Breath sounds: Normal breath sounds.  Abdominal:     Palpations: Abdomen is soft.     Tenderness: There is no abdominal tenderness.  Musculoskeletal:        General: Normal range of motion.     Cervical back: Neck supple.  Skin:    General: Skin is warm and dry.  Neurological:     General: No focal deficit present.     Mental Status: He is alert.  Psychiatric:        Mood and Affect: Mood normal.     ED Results / Procedures / Treatments   Labs (all labs ordered are listed, but only abnormal results are displayed) Labs Reviewed  CBC WITH DIFFERENTIAL/PLATELET - Abnormal; Notable for the following components:      Result Value   RBC 3.60 (*)    Hemoglobin 11.1 (*)    HCT 33.8 (*)    Platelets 139 (*)    nRBC 0.4 (*)    All other components within normal limits  COMPREHENSIVE METABOLIC PANEL - Abnormal; Notable for the following components:   Sodium 134 (*)    CO2 19 (*)    Glucose, Bld 151 (*)    BUN 22 (*)    Creatinine, Ser 1.53 (*)    Calcium 8.4 (*)    Total Protein 5.9  (*)    Albumin 3.1 (*)    AST 405 (*)    ALT 270 (*)    Total Bilirubin 2.0 (*)    GFR, Estimated 49 (*)    All other components within normal limits  BRAIN NATRIURETIC PEPTIDE - Abnormal; Notable for the following components:   B Natriuretic Peptide 2,812.2 (*)    All other components within normal limits  TROPONIN I (HIGH SENSITIVITY) - Abnormal; Notable for the following components:   Troponin I (High Sensitivity) 41 (*)    All other components within normal limits  RESPIRATORY PANEL BY RT PCR (FLU A&B, COVID)  LIPASE, BLOOD  TROPONIN I (HIGH SENSITIVITY)    EKG EKG Interpretation  Date/Time:  Friday December 24 2019 13:22:05 EDT Ventricular Rate:  132 PR Interval:    QRS Duration: 99 QT Interval:  299 QTC Calculation: 443 R Axis:   -22 Text Interpretation: Sinus tachycardia LVH with secondary repolarization abnormality Anterior infarct, old ST depr, consider ischemia, inferior leads agree, previous LVH. similar to previous but ST changes more pronounced likely rate related Confirmed by Charlesetta Shanks [phone]) on 12/24/2019 1:28:28 PM   Radiology DG Chest Port 1 View  Result Date: 12/24/2019 CLINICAL DATA:  Increasing shortness of breath over the past week. EXAM: PORTABLE CHEST 1 VIEW COMPARISON:  PA and lateral chest 06/16/2019. FINDINGS: There is a small right pleural effusion and mild basilar atelectasis. Left lung is clear. No pneumothorax. Heart size is upper normal. No acute or focal bony abnormality. IMPRESSION: Small right pleural effusion and basilar atelectasis are new since the prior exam. Electronically Signed   By: Inge Rise M.D.   On: 12/24/2019 14:36    Procedures Procedures (including critical care time)  Medications Ordered in ED Medications  sodium chloride 0.9 % bolus 500 mL (0 mLs Intravenous Paused 12/24/19 1440)    ED Course  I have reviewed the  triage vital signs and the nursing notes.  Pertinent labs & imaging results that were  available during my care of the patient were reviewed by me and considered in my medical decision making (see chart for details).  Clinical Course as of Dec 23 1698  Fri Dec 24, 2019  1658 DG Chest [place] 1 View [LS]    Clinical Course User Index [LS] Fransico Meadow, [place]   MDM Rules/Calculators/A&P                         Pt has an elevated BNP.  Chest xray shows new area of atelectasis.  LFts elevated and renal fuctions slightly elevated Pt given IV lasix.  I spoke to the internal medicine resident who will see for admission  Final Clinical Impression(s) / ED Diagnoses Final diagnoses:  Congestive heart failure, unspecified HF chronicity, unspecified heart failure type [facility])    Rx / DC Orders ED Discharge Orders    None       Sidney Ace 12/24/19 1730    Fredia Sorrow, MD 12/29/19 [phone]

## 2019-12-24 NOTE — Progress Notes (Addendum)
ANTICOAGULATION CONSULT NOTE - Initial Consult  Pharmacy Consult for eliquis Indication: atrial fibrillation  Allergies  Allergen Reactions  . Adhesive [Tape] Other (See Comments)    Heat and EKG pads flare pre-existing eczema  . Heparin Other (See Comments)    HIT ab +, SRA  negative    Patient Measurements: Height: 5\' 10"  (177.8 cm) Weight: 61.2 kg (135 lb) IBW/kg (Calculated) : 73   Vital Signs: Temp: 99.4 F (37.4 C) (10/15 1323) Temp Source: Oral (10/15 1323) BP: 146/119 (10/15 1830) Pulse Rate: 108 (10/15 1830)  Labs: Recent Labs    12/24/19 1345 12/24/19 1604  HGB 11.1*  --   HCT 33.8*  --   PLT 139*  --   CREATININE 1.53*  --   TROPONINIHS 41* 41*    Estimated Creatinine Clearance: 44.4 mL/min (A) (by C-G formula based on SCr of 1.53 mg/dL (H)).   Medical History: Past Medical History:  Diagnosis Date  . Anxiety   . Arthritis   . Bipolar 1 disorder ([place])   . Depression    Bipolar, not on meds/pt. stopped meds on own over 12 yrs ago  . GERD (gastroesophageal reflux disease)   . History of colon polyps   . Manic disorder ([place])   . Noncompliance 05/14/2019  . Substance abuse ([place])    29yrs ago smoke crack and marjuana  . Tobacco abuse 05/13/2012    Assessment: 59 year old male (with documented heparin allergy) presents with fatigue x1 month and increasing ShOB. Pt was initially prescribed Eliquis in March 2021 for nonvalvular afib, but pt never picked up the prescription from the pharmacy. Pharmacy is now consulted to restart Eliquis therapy inpatient.   CBC; Hgb 11.1; plt 131   Goal of Therapy:  Monitor platelets by anticoagulation protocol: Yes   Plan:  apixaban 5mg  by mouth daily Monitor s/sx bleeding, CBC, clinical progress.  Carolin Guernsey  pgy1 pharmacy resident 12/24/2019,8:27 PM

## 2019-12-24 NOTE — ED Notes (Signed)
Attempted report 

## 2019-12-24 NOTE — ED Triage Notes (Signed)
Pt bib EMS for complaints of SOB that has progressively worsened over the past week. Pt states that SOB worsens when standing and walking. Generalized weakness noted.   Hx of CHF  Vitals: BP: 170/112 HR: 130 O2: 99

## 2019-12-24 NOTE — ED Notes (Signed)
Spoke to [place] per pt request and consent. Provided update on pt care. [name], per pt is allowed to visit and be provided with all updates about ongoing care.

## 2019-12-24 NOTE — Consult Note (Signed)
CONSULTATION NOTE   Patient Name: Austin Cobb Date of Encounter: 12/24/2019 Cardiologist: Donato Heinz, MD  Chief Complaint   Shortness of breath Heart failure exacerbation  Impression   Acute on chronic HFrEF exacerbation,  EF10%  - etiology likely ETOH and possible tachy mediated Biventricular failure  Aflutter RVR (2:1)- non compliant with eliquis Medication non compliance ETOH use/dependence disorder H/o thrombocytopenia and HIT +  transaminitis- sec to ETOH (AST> ALT) ?hematuria reported by patient  Recommendation   - on exam patient is luke warm (legs), mentating well, HTNsive and tachycardic (aflutter 2:1). I do not suspect he is in  Cardiogenic shock - continue aggressive diuresis:  IV lasix 40mg  BID, continue cardiac meds: aspirin, entresto 24/26mg  BID, resume aldactone 25mg  daily  - load with IV digoxin (570mcg now and then 250 in 6 hours x2). Obtain dig level on Sunday am - hold coreg (unsure when he took last and now in decompensated HF)- can start gradually low dose from tomorrow if BP is stable and warm legs - resume eliquis 5mg  BID. Hold off on amiodarone or rhythm control, non compliant with eliquis. Will need TEE DCCV once euvolemic vs aflutter ablation.  - ETOH withdrawal and give multivitamins, nicotine patch - obtain US liver to evaluate for Cirrhosis (LFTs are up- likely sec to ETOH) but he also has RV failure - obtain lactic acid to r/o poor perfusion.  Patient Profile  59 y/o male w/ h/o polysubstance abuse (tobacco and ETOH + remote cocaine and THC use), bipolar disorder, non obstructive CAD (LHC 05/18/19), severe biventricular failure, EF 10%   - on exam  Has JVD, no LE edema, mild crackles in the legs  HPI   Austin Cobb is a 59 y.o. male who is being seen today for the evaluation of HFrEF at the request of Austin Cobb  He presented today with c/o SOB since 1 week that has gotten worse but reports symptoms since last few  weeks with progressive orthopnea, pnd and abdominal swelling. Denies any chest pain, dizziness syncope. Last drink was a week ago (2x 40oz of beer), smokes 1 pack cigarette, used cocaine last night. Denies IV drug use Non compliant with medications- does not remember and does not take regularly Lives at home, can barely walk 10 ft, NYHA class III-IV symptoms.  ED Course: On arrival to the ED, patient was tachycardic (132) and hypertensive (141/121,) but otherwise hemodynamically stable and saturating well on room air. Initial labs revealed CBC: Hb 11.1; BNP 2,812.2 CMP: Cr 1.53, AST 405, ALT 270, TBili 2.0; troponin 41 then 41; lipase 24; COVID negative; Marland Kitchen CXR revealed small right pleural effusion and basilar atelectasis. He received 500cc bolus of normal saline and 40mg  lasix. He was admitted to IMTS for further evaluation and management of his condition  EKG today: 2:1 aflutter RVR (looks typical), anteroseptal infarct (old), TWI in lateral leads and mild STdepression in inferior leads Second EKG: aflutter  BNP: 2,812 Trop 41  ECHO: 05/05/19 IMPRESSIONS    1. LVOT VTI 7 cm. CO ~3.5 L/min. CI ~2.0 L/min/m2. Left ventricular  ejection fraction, by estimation, is 10-15%. The left ventricle has  severely decreased function. The left ventricle demonstrates global  hypokinesis. Left ventricular diastolic function  could not be evaluated. Left ventricular diastolic function could not be  evaluated.  2. Right ventricular systolic function is severely reduced. The right  ventricular size is severely enlarged. There is normal pulmonary artery  systolic pressure. The estimated right  ventricular systolic pressure is  88.8 mmHg.  3. Left atrial size was mildly dilated.  4. Right atrial size was mild to moderately dilated.  5. The mitral valve is grossly normal. Mild to moderate mitral valve  regurgitation.  6. The aortic valve is tricuspid. Aortic valve regurgitation is not  visualized. No  aortic stenosis is present.  7. The inferior vena cava is dilated in size with <50% respiratory  variability, suggesting right atrial pressure of 15 mmHg.   TEE DCCV 05/20/19 for aflutter RVR  RHC: 05/18/19 Right Heart Pressures RHC Procedural Findings: Hemodynamics (mmHg) RA mean 3 RV 22/1 PA 22/7, mean 12 PCWP mean 6 LV 126/9 AO 138/75  Oxygen saturations: PA 74% AO 100%  Cardiac Output (Fick) 5.48  Cardiac Index (Fick) 3.19   LHC: Left Main  Short, no significant disease.  Left Anterior Descending  No significant coronary disease.  Left Circumflex  No significant coronary disease.  Right Coronary Artery  40% proximal RCA stenosis.     ECG   As above- Personally Reviewed  Telemetry    Personally Reviewed  Radiology   DG Chest Port 1 View  Result Date: 12/24/2019 CLINICAL DATA:  Increasing shortness of breath over the past week. EXAM: PORTABLE CHEST 1 VIEW COMPARISON:  PA and lateral chest 06/16/2019. FINDINGS: There is a small right pleural effusion and mild basilar atelectasis. Left lung is clear. No pneumothorax. Heart size is upper normal. No acute or focal bony abnormality. IMPRESSION: Small right pleural effusion and basilar atelectasis are new since the prior exam. Electronically Signed   By: Inge Rise M.D.   On: 12/24/2019 14:36    Cardiac Studies   As above  PMHx   Past Medical History:  Diagnosis Date  . Anxiety   . Arthritis   . Bipolar 1 disorder ([place])   . Depression    Bipolar, not on meds/pt. stopped meds on own over 12 yrs ago  . GERD (gastroesophageal reflux disease)   . History of colon polyps   . Manic disorder ([place])   . Noncompliance 05/14/2019  . Substance abuse ([place])    57yrs ago smoke crack and marjuana  . Tobacco abuse 05/13/2012    Past Surgical History:  Procedure Laterality Date  . CARDIOVERSION N/A 05/06/2019   Procedure: CARDIOVERSION;  Surgeon: Acie Fredrickson Wonda Cheng, MD;  Location: [facility] ENDOSCOPY;  Service:  Cardiovascular;  Laterality: N/A;  . CARDIOVERSION N/A 05/20/2019   Procedure: CARDIOVERSION;  Surgeon: Larey Dresser, MD;  Location: [facility] ENDOSCOPY;  Service: Cardiovascular;  Laterality: N/A;  . EXAMINATION UNDER ANESTHESIA N/A 11/12/2012   Procedure: EXAM UNDER ANESTHESIA;  Surgeon: Adin Hector, MD;  Location: [facility] ORS;  Service: General;  Laterality: N/A;  . EYE SURGERY     bilateral eye sx as a child 6-5yrs old.  Marland Kitchen RIGHT/LEFT HEART CATH AND CORONARY ANGIOGRAPHY N/A 05/18/2019   Procedure: RIGHT/LEFT HEART CATH AND CORONARY ANGIOGRAPHY;  Surgeon: Larey Dresser, MD;  Location: [facility];  Service: Cardiovascular;  Laterality: N/A;  . SPHINCTEROTOMY N/A 11/12/2012   Procedure: SPHINCTEROTOMY;  Surgeon: Adin Hector, MD;  Location: [facility] ORS;  Service: General;  Laterality: N/A;  . TEE WITHOUT CARDIOVERSION N/A 05/06/2019   Procedure: TRANSESOPHAGEAL ECHOCARDIOGRAM (TEE);  Surgeon: Acie Fredrickson Wonda Cheng, MD;  Location: [facility];  Service: Cardiovascular;  Laterality: N/A;  . TEE WITHOUT CARDIOVERSION N/A 05/20/2019   Procedure: TRANSESOPHAGEAL ECHOCARDIOGRAM (TEE);  Surgeon: Larey Dresser, MD;  Location: [facility] ENDOSCOPY;  Service: Cardiovascular;  Laterality: N/A;  . TONSILLECTOMY      FAMHx   No family history on file.  SOCHx    reports that he has been smoking cigarettes. He has a 35.00 pack-year smoking history. He has never used smokeless tobacco. He reports current alcohol use of about 4.0 standard drinks of alcohol per week. He reports current drug use. Drugs: Cocaine and Marijuana.  Outpatient Medications   No current facility-administered medications on file prior to encounter.   Current Outpatient Medications on File Prior to Encounter  Medication Sig Dispense Refill  . aspirin EC 81 MG tablet Take 81 mg by mouth daily.     . cholecalciferol (VITAMIN D3) 25 MCG (1000 UNIT) tablet Take 2,000 Units by mouth daily.    . ferrous sulfate 324 MG TBEC Take 324 mg by mouth  daily with breakfast.    . amiodarone (PACERONE) 200 MG tablet Take 1 tablet (200 mg total) by mouth 2 (two) times daily.    Marland Kitchen apixaban (ELIQUIS) 5 MG TABS tablet Take 1 tablet (5 mg total) by mouth 2 (two) times daily. 60 tablet   . carvedilol (COREG) 3.125 MG tablet Take 1 tablet (3.125 mg total) by mouth 2 (two) times daily with a meal.    . digoxin (LANOXIN) 0.125 MG tablet Take 1 tablet (0.125 mg total) by mouth daily.    . feeding supplement, ENSURE ENLIVE, (ENSURE ENLIVE) LIQD Take 237 mLs by mouth in the morning, at noon, in the evening, and at bedtime. 237 mL 12  . lactulose (CHRONULAC) 10 GM/15ML solution Take 30 mLs (20 g total) by mouth 2 (two) times daily. 236 mL 0  . Multiple Vitamin (MULTIVITAMIN WITH MINERALS) TABS tablet Take 1 tablet by mouth daily.    . sacubitril-valsartan (ENTRESTO) 24-26 MG Take 1 tablet by mouth 2 (two) times daily. 60 tablet   . spironolactone (ALDACTONE) 25 MG tablet Take 0.5 tablets (12.5 mg total) by mouth daily.      Inpatient Medications    Scheduled Meds: . apixaban  5 mg Oral BID  . [START ON 12/25/2019] aspirin EC  81 mg Oral Daily  . folic acid  1 mg Oral Daily  . furosemide  40 mg Oral Once  . multivitamin with minerals  1 tablet Oral Daily  . sodium chloride flush  3 mL Intravenous Q12H  . thiamine  100 mg Oral Daily   Or  . thiamine  100 mg Intravenous Daily    Continuous Infusions: . sodium chloride      PRN Meds: sodium chloride, LORazepam **OR** LORazepam, ondansetron (ZOFRAN) IV, sodium chloride flush   ALLERGIES   Allergies  Allergen Reactions  . Adhesive [Tape] Other (See Comments)    Heat and EKG pads flare pre-existing eczema  . Heparin Other (See Comments)    HIT ab +, SRA  negative    ROS   ROS negative except above  Vitals   Vitals:   12/24/19 1945 12/24/19 2015 12/24/19 2045 12/24/19 2050  BP: (!) 137/114 (!) 163/125 (!) 151/116 (!) 151/116  Pulse: (!) 131 (!) 132 (!) 134 (!) 134  Resp: (!) 30 (!) 26  (!) 23   Temp:      TempSrc:      SpO2: 98% 100% 99%   Weight:      Height:        Intake/Output Summary (Last 24 hours) at 12/24/2019 2107 Last data filed at 12/24/2019 1440 Gross per 24 hour  Intake 99.9 ml  Output --  Net 99.9 ml   Filed Weights   12/24/19 1323  Weight: 61.2 kg    Physical Exam   HEENT: Normocephalic and atraumatic.  Neck: Supple. No carotid bruits.  JVD angle of mandible. Heart: tachycardic, normal S1 and S2.holosystolic murmur LLSB No gallops or rubs. Radial and distal pedal pulses 2+ and equal bilaterally. Lungs: mild crackles at the bases, no crackles, wheeze Abdomen: Soft, non-distended, and non-tender to palpation. Bowel sounds present. Extremities: trace lower extremity edema.    Skin: Warm and hypervolemic Neuro: Alert,oriented x3, No focal deficits. Psych: Normal affect. Responds appropriately.  Labs   Results for orders placed or performed during the hospital encounter of 12/24/19 (from the past 48 hour(s))  CBC with Differential/Platelet     Status: Abnormal   Collection Time: 12/24/19  1:45 PM  Result Value Ref Range   WBC 6.8 4.0 - 10.5 K/uL   RBC 3.60 (L) 4.22 - 5.81 MIL/uL   Hemoglobin 11.1 (L) 13.0 - 17.0 g/dL   HCT 33.8 (L) 39 - 52 %   MCV 93.9 80.0 - 100.0 fL   MCH 30.8 26.0 - 34.0 pg   MCHC 32.8 30.0 - 36.0 g/dL   RDW 14.6 11.5 - 15.5 %   Platelets 139 (L) 150 - 400 K/uL   nRBC 0.4 (H) 0.0 - 0.2 %   Neutrophils Relative % 61 %   Neutro Abs 4.1 1.7 - 7.7 K/uL   Lymphocytes Relative 26 %   Lymphs Abs 1.8 0.7 - 4.0 K/uL   Monocytes Relative 9 %   Monocytes Absolute 0.6 0.1 - 1.0 K/uL   Eosinophils Relative 2 %   Eosinophils Absolute 0.1 0.0 - 0.5 K/uL   Basophils Relative 2 %   Basophils Absolute 0.1 0.0 - 0.1 K/uL   Immature Granulocytes 0 %   Abs Immature Granulocytes 0.03 0.00 - 0.07 K/uL    Comment: Performed at [facility], [address], [place], [place] [address]  Comprehensive metabolic panel     Status:  Abnormal   Collection Time: 12/24/19  1:45 PM  Result Value Ref Range   Sodium 134 (L) 135 - 145 mmol/L   Potassium 4.0 3.5 - 5.1 mmol/L   Chloride 104 98 - 111 mmol/L   CO2 19 (L) 22 - 32 mmol/L   Glucose, Bld 151 (H) 70 - 99 mg/dL    Comment: Glucose reference range applies only to samples taken after fasting for at least 8 hours.   BUN 22 (H) 6 - 20 mg/dL   Creatinine, Ser 1.53 (H) 0.61 - 1.24 mg/dL   Calcium 8.4 (L) 8.9 - 10.3 mg/dL   Total Protein 5.9 (L) 6.5 - 8.1 g/dL   Albumin 3.1 (L) 3.5 - 5.0 g/dL   AST 405 (H) 15 - 41 U/L   ALT 270 (H) 0 - 44 U/L   Alkaline Phosphatase 97 38 - 126 U/L   Total Bilirubin 2.0 (H) 0.3 - 1.2 mg/dL   GFR, Estimated 49 (L) >60 mL/min   Anion gap 11 5 - 15    Comment: Performed at [facility] [address], [place], [place] [address]  Brain natriuretic peptide     Status: Abnormal   Collection Time: 12/24/19  1:45 PM  Result Value Ref Range   B Natriuretic Peptide 2,812.2 (H) 0.0 - 100.0 pg/mL    Comment: Performed at [facility] [address], [place], [place] [address]  Troponin I (High Sensitivity)  Status: Abnormal   Collection Time: 12/24/19  1:45 PM  Result Value Ref Range   Troponin I (High Sensitivity) 41 (H) <18 ng/L    Comment: (NOTE) Elevated high sensitivity troponin I (hsTnI) values and significant  changes across serial measurements may suggest ACS but many other  chronic and acute conditions are known to elevate hsTnI results.  Refer to the "Links" section for chest pain algorithms and additional  guidance. Performed at [facility], [place] [address] [place], [place] [address]   Lipase, blood     Status: None   Collection Time: 12/24/19  1:45 PM  Result Value Ref Range   Lipase 24 11 - 51 U/L    Comment: Performed at [facility] [address] [place], [place] [address]  Respiratory Panel by RT PCR (Flu A&B, Covid) - Nasopharyngeal Swab     Status: None   Collection Time: 12/24/19  1:50  PM   Specimen: Nasopharyngeal Swab  Result Value Ref Range   SARS Coronavirus 2 by RT PCR NEGATIVE NEGATIVE    Comment: (NOTE) SARS-CoV-2 target nucleic acids are NOT DETECTED.  The SARS-CoV-2 RNA is generally detectable in upper respiratoy specimens during the acute phase of infection. The lowest concentration of SARS-CoV-2 viral copies this assay can detect is 131 copies/mL. A negative result does not preclude SARS-Cov-2 infection and should not be used as the sole basis for treatment or other patient management decisions. A negative result may occur with  improper specimen collection/handling, submission of specimen other than nasopharyngeal swab, presence of viral mutation(s) within the areas targeted by this assay, and inadequate number of viral copies (<131 copies/mL). A negative result must be combined with clinical observations, patient history, and epidemiological information. The expected result is Negative.  Fact Sheet for Patients:  PinkCheek.be  Fact Sheet for Healthcare Providers:  GravelBags.it  This test is no t yet approved or cleared by the [place] FDA and  has been authorized for detection and/or diagnosis of SARS-CoV-2 by FDA under an Emergency Use Authorization (EUA). This EUA will remain  in effect (meaning this test can be used) for the duration of the COVID-19 declaration under Section 564(b)(1) of the Act, 21 U.S.C. section 360bbb-3(b)(1), unless the authorization is terminated or revoked sooner.     Influenza A by PCR NEGATIVE NEGATIVE   Influenza B by PCR NEGATIVE NEGATIVE    Comment: (NOTE) The Xpert Xpress SARS-CoV-2/FLU/RSV assay is intended as an aid in  the diagnosis of influenza from Nasopharyngeal swab specimens and  should not be used as a sole basis for treatment. Nasal washings and  aspirates are unacceptable for Xpert Xpress SARS-CoV-2/FLU/RSV  testing.  Fact Sheet for  Patients: PinkCheek.be  Fact Sheet for Healthcare Providers: GravelBags.it  This test is not yet approved or cleared by the [place] FDA and  has been authorized for detection and/or diagnosis of SARS-CoV-2 by  FDA under an Emergency Use Authorization (EUA). This EUA will remain  in effect (meaning this test can be used) for the duration of the  Covid-19 declaration under Section 564(b)(1) of the Act, 21  U.S.C. section 360bbb-3(b)(1), unless the authorization is  terminated or revoked. Performed at [facility], [place] [address] [place], [place] [address]   Troponin I (High Sensitivity)     Status: Abnormal   Collection Time: 12/24/19  4:04 PM  Result Value Ref Range   Troponin I (High Sensitivity) 41 (H) <18 ng/L    Comment: (  NOTE) Elevated high sensitivity troponin I (hsTnI) values and significant  changes across serial measurements may suggest ACS but many other  chronic and acute conditions are known to elevate hsTnI results.  Refer to the "Links" section for chest pain algorithms and additional  guidance. Performed at [facility], [place] [address] [place], [place] [address]         Length of Stay:  LOS: 0 days      Renae Fickle 12/24/2019, 9:07 PM

## 2019-12-24 NOTE — H&P (Addendum)
Date: 12/24/2019               Patient Name:  Austin Cobb MRN: [id number]  DOB: 1961-02-15 Age / Sex: 59 y.o., male   PCP: Patient, No Pcp Per         Medical Service: Internal Medicine Teaching Service         Attending Physician: Dr. Lenice Pressman    First Contact: Dr. Virl Axe Pager: [phone]  Second Contact: Dr. Tamsen Snider Pager: [phone]       After Hours (After 5p/  First Contact Pager: [phone]  weekends / holidays): Second Contact Pager: [phone]   Chief Complaint: shortness of breath   History of Present Illness: Austin Cobb is a 60 year old male with a past medical history of tobacco use disorder, alcohol use disorder, bipolar 1 disorder, GERD, atrial fibrillation,  and systolic heart failure (EF 25-30%) presents with dyspnea on exertion, and fatigue over 1 month. He said he cant walk 50 feet without becoming short of breath. He hasn't been able to sleep due to shortness of breath.  His symptoms have gotten progressively worse over this month and he says he has not been taking any of his medications other than Asprin. He has been feeling weaker this month. He usually drinks 2 x 40 oz beer a day, but has not drank a whole beer in the last 2 days. Says he also has notice his urine has been red and endorses dysuria. Denies fever , chills, cough, abdominal pain, hematochezia, hematemesis, chest pain, changes in vision, constipation, or diarrhea.   ED Course: On arrival to the ED, patient was tachycardic (132) and hypertensive (141/121,) but otherwise hemodynamically stable and saturating well on room air. Initial labs revealed CBC: Hb 11.1; BNP 2,812.2 CMP: Cr 1.53, AST 405, ALT 270, TBili 2.0; troponin 41 then 41; lipase 24; COVID negative; Marland Kitchen CXR revealed small right pleural effusion and basilar atelectasis. He received 500cc bolus of normal saline and 40mg  lasix. He was admitted to IMTS for further evaluation and management of his condition.   Meds:  No current  facility-administered medications on file prior to encounter.   Current Outpatient Medications on File Prior to Encounter  Medication Sig Dispense Refill  . aspirin EC 81 MG tablet Take 81 mg by mouth daily.     . cholecalciferol (VITAMIN D3) 25 MCG (1000 UNIT) tablet Take 2,000 Units by mouth daily.    . ferrous sulfate 324 MG TBEC Take 324 mg by mouth daily with breakfast.    . amiodarone (PACERONE) 200 MG tablet Take 1 tablet (200 mg total) by mouth 2 (two) times daily.    Marland Kitchen apixaban (ELIQUIS) 5 MG TABS tablet Take 1 tablet (5 mg total) by mouth 2 (two) times daily. 60 tablet   . carvedilol (COREG) 3.125 MG tablet Take 1 tablet (3.125 mg total) by mouth 2 (two) times daily with a meal.    . digoxin (LANOXIN) 0.125 MG tablet Take 1 tablet (0.125 mg total) by mouth daily.    . feeding supplement, ENSURE ENLIVE, (ENSURE ENLIVE) LIQD Take 237 mLs by mouth in the morning, at noon, in the evening, and at bedtime. 237 mL 12  . lactulose (CHRONULAC) 10 GM/15ML solution Take 30 mLs (20 g total) by mouth 2 (two) times daily. 236 mL 0  . Multiple Vitamin (MULTIVITAMIN WITH MINERALS) TABS tablet Take 1 tablet by mouth daily.    . sacubitril-valsartan (ENTRESTO) 24-26 MG Take 1 tablet by mouth  2 (two) times daily. 60 tablet   . spironolactone (ALDACTONE) 25 MG tablet Take 0.5 tablets (12.5 mg total) by mouth daily.     Allergies: Allergies as of 12/24/2019 - Review Complete 12/24/2019  Allergen Reaction Noted  . Adhesive [tape] Other (See Comments) 06/16/2019  . Heparin Other (See Comments) 05/12/2019   Past Medical History:  Diagnosis Date  . Anxiety   . Arthritis   . Bipolar 1 disorder ([place])   . Depression    Bipolar, not on meds/pt. stopped meds on own over 12 yrs ago  . GERD (gastroesophageal reflux disease)   . History of colon polyps   . Manic disorder ([place])   . Noncompliance 05/14/2019  . Substance abuse ([place])    17yrs ago smoke crack and marjuana  . Tobacco abuse 05/13/2012   Family  History:  No family history on file. Social History:  Social History   Tobacco Use  . Smoking status: Current Every Day Smoker    Packs/day: 1.00    Years: 35.00    Pack years: 35.00    Types: Cigarettes  . Smokeless tobacco: Never Used  Substance Use Topics  . Alcohol use: Yes    Alcohol/week: 4.0 standard drinks    Types: 4 Cans of beer per week    Comment: 4 cans of beer a day   . Drug use: Yes    Types: Cocaine, Marijuana    Comment: Quit x6 yrs ago-    Review of Systems: A complete ROS was negative except as per HPI.  Physical Exam: Blood pressure (!) 151/116, pulse (!) 134, temperature 99.4 F (37.4 C), temperature source Oral, resp. rate (!) 23, height 5\' 10"  (1.778 m), weight 61.2 kg, SpO2 99 %.  General: Chronically ill appearing , thin male  HE: Normocephalic, atraumatic , EOMI, icteric sclera  ENT: No congestion, no rhinorrhea, no exudate or erythema  Cardiovascular: Tachycardic, regular rhythm.  No murmurs, rubs, or gallops, no JVD Pulmonary : no accessory muscle use . No wheezes, rales at bases  Abdominal: soft, nontender,  bowel sounds present Musculoskeletal: no swelling , deformity, injury ,or tenderness in extremities, Skin: cool distal extremities, no skin lesions  Psychiatric/Behavioral:  normal mood, normal behavior  Neuro: Alert and oriented x 4 , no focal deficits   EKG: personally reviewed my interpretation is 2 to 1 Atrial Flutter  CXR: personally reviewed my interpretation is small right pleural effusion and basilar atelectasis  Assessment & Plan by Problem: Principal Problem:   Acute on chronic heart failure (HCC) Active Problems:   Tobacco abuse   Atrial flutter with rapid ventricular response (HCC)   Alcohol withdrawal ([place])   E. coli UTI  Austin Cobb is a 59 year old male with a past medical history of tobacco use disorder, alcohol use disorder, bipolar 1 disorder, GERD, atrial fibrillation,  and systolic heart failure (EF 25-30%)  presents with dyspnea on exertion and fatigue over 1 month.  #Acute on chronic heart failure  Patient has been off of his medications for heart failure and is presenting with a elevated BNP compared to last admission, small right pleural effusion on chest x-ray,  and shortness of breath.  Endorsing orthopnea and PND at home. TEE on 05/20/2019, EF noted to be 25 to 30% with global hypokineses. Right/Left heart cath on 3/9 showed nonobstructive mild coronary disease. Heart failure thought to be secondary to tachycardia vs alcohol use disorder.  Has a narrow pulse pressure, but systolic blood pressure elevated to160 is reassuring patient  isnt in cardiogenic shock. See problem below , patient in Atrial flutter on admission.  He is not requiring any supplemental oxygen once the pulse ox is repositioned he sats 100% on room air. HSTroponin are flat, likely elevated to demand. Will diurese gently and rate control patient atrial flutter as below.  -Given patient has been off medication in acute exacerbation, holding Coreg and Entresto -Daily weights -Ins and outs -Lasix 40 mg once  #Atrial Flutter -Patient is alert and oriented on exam. The patient is in 2 to 1 flutter on review of EKG. patient has been off of anticoagulation and had a reaction to heparin on last admission.  We will start back on apixaban.  Consulted on-call cardiologist and recommended starting digoxin in setting of patient not being anticoagulated.  We will start digoxin with a loading dose of 500 MCG and follow with 250 MCG in 6 hours, and 250 MCG the next 6 hours.  -Digoxin load followed by digoxin as described above -Apixaban -Telemetry - Greatly appreciate cardiology consulting, see note for final recommendations   #Alcohol use disorder Patient has a history of drinking 2x 40 ounce beers per day, but hasnt had a taste for alcohol in the past few days given he has felt horrible.  Will place patient on CIWA protocol with out  Ativan  given it will be hard to access accurately. Night team is aware of patient risk and will treat patient if needed.  Patient not tremulous on exam or endorsing any cravings for alcohol.  - CIWA - Thiamine, Folate, MV -UDS  #Transaminitis Patient with a history of alcohol use disorder with a AST to ALT ratio to 1.  Enzymes elevated greater than 6 months ago on last check.  Bilirubin also elevated. Lipase normal. No abdominal pain.  -Trend CMP - RUQ [place]  #AKI  Baseline Cr appears to be around 1.1, patient elevated to 1.53 on admission. Patient in atrial flutter and heart failure. Will trend with diuresis   #Tabacco use disorder - Nicotine patch  #Dysuria, history of E.Coli UTI - UA with reflex  #Thrombocytopenia #Anemia - Trend CBC  Dispo: Admit patient to Inpatient with expected length of stay greater than 2 midnights.  Signed: Madalyn Rob, MD 12/24/2019, 9:44 PM  Pager: [phone] After 5pm on weekdays and 1pm on weekends: On Call pager: [phone]

## 2019-12-25 ENCOUNTER — Inpatient Hospital Stay (HOSPITAL_COMMUNITY): Payer: Medicare PPO

## 2019-12-25 DIAGNOSIS — R300 Dysuria: Secondary | ICD-10-CM

## 2019-12-25 DIAGNOSIS — K7030 Alcoholic cirrhosis of liver without ascites: Secondary | ICD-10-CM

## 2019-12-25 DIAGNOSIS — I4891 Unspecified atrial fibrillation: Secondary | ICD-10-CM

## 2019-12-25 DIAGNOSIS — Z8619 Personal history of other infectious and parasitic diseases: Secondary | ICD-10-CM

## 2019-12-25 DIAGNOSIS — I5021 Acute systolic (congestive) heart failure: Secondary | ICD-10-CM | POA: Diagnosis not present

## 2019-12-25 DIAGNOSIS — I5043 Acute on chronic combined systolic (congestive) and diastolic (congestive) heart failure: Secondary | ICD-10-CM | POA: Diagnosis not present

## 2019-12-25 DIAGNOSIS — Z720 Tobacco use: Secondary | ICD-10-CM

## 2019-12-25 DIAGNOSIS — N179 Acute kidney failure, unspecified: Secondary | ICD-10-CM

## 2019-12-25 DIAGNOSIS — I4892 Unspecified atrial flutter: Secondary | ICD-10-CM

## 2019-12-25 DIAGNOSIS — I5023 Acute on chronic systolic (congestive) heart failure: Secondary | ICD-10-CM

## 2019-12-25 DIAGNOSIS — K7010 Alcoholic hepatitis without ascites: Secondary | ICD-10-CM

## 2019-12-25 DIAGNOSIS — F10239 Alcohol dependence with withdrawal, unspecified: Secondary | ICD-10-CM

## 2019-12-25 DIAGNOSIS — Z87440 Personal history of urinary (tract) infections: Secondary | ICD-10-CM

## 2019-12-25 LAB — URINALYSIS, ROUTINE W REFLEX MICROSCOPIC
Bilirubin Urine: NEGATIVE
Glucose, UA: NEGATIVE mg/dL
Ketones, ur: NEGATIVE mg/dL
Nitrite: NEGATIVE
Protein, ur: NEGATIVE mg/dL
Specific Gravity, Urine: 1.005 (ref 1.005–1.030)
pH: 5 (ref 5.0–8.0)

## 2019-12-25 LAB — COMPREHENSIVE METABOLIC PANEL
ALT: 283 U/L — ABNORMAL HIGH (ref 0–44)
AST: 383 U/L — ABNORMAL HIGH (ref 15–41)
Albumin: 3.3 g/dL — ABNORMAL LOW (ref 3.5–5.0)
Alkaline Phosphatase: 117 U/L (ref 38–126)
Anion gap: 11 (ref 5–15)
BUN: 24 mg/dL — ABNORMAL HIGH (ref 6–20)
CO2: 20 mmol/L — ABNORMAL LOW (ref 22–32)
Calcium: 8.5 mg/dL — ABNORMAL LOW (ref 8.9–10.3)
Chloride: 102 mmol/L (ref 98–111)
Creatinine, Ser: 1.52 mg/dL — ABNORMAL HIGH (ref 0.61–1.24)
GFR, Estimated: 49 mL/min — ABNORMAL LOW (ref >60)
Glucose, Bld: 92 mg/dL (ref 70–99)
Potassium: 4.1 mmol/L (ref 3.5–5.1)
Sodium: 133 mmol/L — ABNORMAL LOW (ref 135–145)
Total Bilirubin: 1.7 mg/dL — ABNORMAL HIGH (ref 0.3–1.2)
Total Protein: 6.7 g/dL (ref 6.5–8.1)

## 2019-12-25 LAB — RAPID URINE DRUG SCREEN, HOSP PERFORMED
Amphetamines: NOT DETECTED
Barbiturates: NOT DETECTED
Benzodiazepines: NOT DETECTED
Cocaine: POSITIVE — AB
Opiates: NOT DETECTED
Tetrahydrocannabinol: NOT DETECTED

## 2019-12-25 LAB — ECHOCARDIOGRAM COMPLETE
AR max vel: 2.88 cm2
AV Area VTI: 3.01 cm2
AV Area mean vel: 2.84 cm2
AV Mean grad: 2 mmHg
AV Peak grad: 3 mmHg
Ao pk vel: 0.87 m/s
Height: 70 in
S' Lateral: 3.8 cm
Weight: 2171.2 oz

## 2019-12-25 LAB — CBC
HCT: 34.6 % — ABNORMAL LOW (ref 39.0–52.0)
Hemoglobin: 11.9 g/dL — ABNORMAL LOW (ref 13.0–17.0)
MCH: 32.2 pg (ref 26.0–34.0)
MCHC: 34.4 g/dL (ref 30.0–36.0)
MCV: 93.8 fL (ref 80.0–100.0)
Platelets: 152 10*3/uL (ref 150–400)
RBC: 3.69 MIL/uL — ABNORMAL LOW (ref 4.22–5.81)
RDW: 14.7 % (ref 11.5–15.5)
WBC: 7.8 10*3/uL (ref 4.0–10.5)
nRBC: 0.5 % — ABNORMAL HIGH (ref 0.0–0.2)

## 2019-12-25 LAB — PROTIME-INR
INR: 1.8 — ABNORMAL HIGH (ref 0.8–1.2)
Prothrombin Time: 19.9 seconds — ABNORMAL HIGH (ref 11.4–15.2)

## 2019-12-25 LAB — MAGNESIUM: Magnesium: 1.7 mg/dL (ref 1.7–2.4)

## 2019-12-25 LAB — LACTIC ACID, PLASMA
Lactic Acid, Venous: 2 mmol/L (ref 0.5–1.9)
Lactic Acid, Venous: 1 mmol/L (ref 0.5–1.9)

## 2019-12-25 LAB — PHOSPHORUS: Phosphorus: 3.5 mg/dL (ref 2.5–4.6)

## 2019-12-25 MED ORDER — CALCIUM CARBONATE ANTACID 500 MG PO CHEW
1.0000 | CHEWABLE_TABLET | Freq: Once | ORAL | Status: AC
  Administered 2019-12-25: 200 mg via ORAL
  Filled 2019-12-25: qty 1

## 2019-12-25 MED ORDER — LORAZEPAM 2 MG/ML IJ SOLN
1.0000 mg | Freq: Four times a day (QID) | INTRAMUSCULAR | Status: DC | PRN
  Administered 2019-12-25: 1 mg via INTRAVENOUS
  Filled 2019-12-25: qty 1

## 2019-12-25 MED ORDER — ALUM & MAG HYDROXIDE-SIMETH 200-200-20 MG/5ML PO SUSP
30.0000 mL | ORAL | Status: DC | PRN
  Administered 2019-12-25 – 2019-12-28 (×3): 30 mL via ORAL
  Filled 2019-12-25 (×3): qty 30

## 2019-12-25 MED ORDER — CARVEDILOL 3.125 MG PO TABS
3.1250 mg | ORAL_TABLET | Freq: Two times a day (BID) | ORAL | Status: DC
  Administered 2019-12-25 – 2019-12-26 (×2): 3.125 mg via ORAL
  Filled 2019-12-25 (×2): qty 1

## 2019-12-25 MED ORDER — LORAZEPAM 2 MG/ML IJ SOLN
1.0000 mg | INTRAMUSCULAR | Status: DC | PRN
  Administered 2019-12-25: 1 mg via INTRAVENOUS
  Filled 2019-12-25: qty 1

## 2019-12-25 MED ORDER — LORAZEPAM 2 MG/ML IJ SOLN
2.0000 mg | Freq: Once | INTRAMUSCULAR | Status: DC
  Filled 2019-12-25 (×2): qty 1

## 2019-12-25 MED ORDER — DIGOXIN 125 MCG PO TABS: 0.1250 mg | ORAL_TABLET | Freq: Every day | ORAL | Status: DC

## 2019-12-25 MED ORDER — LORAZEPAM 1 MG PO TABS: 1.0000 mg | ORAL_TABLET | Freq: Four times a day (QID) | ORAL | Status: DC | PRN

## 2019-12-25 MED ORDER — FUROSEMIDE 10 MG/ML IJ SOLN
40.0000 mg | Freq: Once | INTRAMUSCULAR | Status: AC
  Administered 2019-12-25: 40 mg via INTRAVENOUS
  Filled 2019-12-25: qty 4

## 2019-12-25 MED ORDER — CHLORDIAZEPOXIDE HCL 25 MG PO CAPS
25.0000 mg | ORAL_CAPSULE | Freq: Two times a day (BID) | ORAL | Status: DC
  Administered 2019-12-25: 25 mg via ORAL
  Filled 2019-12-25: qty 1

## 2019-12-25 NOTE — Procedures (Signed)
Echo attempted. Patient eating. Will attempt again later. 

## 2019-12-25 NOTE — Progress Notes (Signed)
CRITICAL VALUE STICKER  CRITICAL VALUE: Lactic 2.0  RECEIVER (on-site recipient of call): Jeraline Marcinek RN  DATE & TIME NOTIFIED: 10/16 0125  MESSENGER (representative from lab): Lab  MD NOTIFIED: Edison Simon MD  TIME OF NOTIFICATION: 0138  RESPONSE: See new orders

## 2019-12-25 NOTE — Progress Notes (Addendum)
Subjective: Made NPO at midnight for RUQ U/S this AM. Lactic acid 2.0 overnight, repeat lactic acid this AM 1.0.  Reports feeling relatively better today. Discussed current plan to which he agreed.  Telemetry still showing A-flutter (2:1 ratio), digoxin levels may still need to reach therapeutic levels as it was started yesterday evening, although a loading dose was administered.  Not very tremulous on exam, so less concerned about possible alcohol withdrawal at this time. Patient on CIWA protocol but will try to be very cautious with giving ativan at this time as it is difficult to attribute certain CIWA parameters from alcohol withdrawal vs persistent A-flutter. Will have nurse page IMTS team prior to giving ativan so that patient can be reevaluated.  UDS + for cocaine use, which may be playing a role in current HFrEF exacerbation.   Patient went for RUQ U/S after our visit.  Addendum: Paged by RN Judson Roch that patient had an episode of vomiting along with tremors and anxiety. Will give 1mg  ativan at this time. Informed RN to call whenever patient has elevated CIWA scores so that we can continuously reassess.  Objective:  Vital signs in last 24 hours: Vitals:   12/25/19 0200 12/25/19 0401 12/25/19 0835 12/25/19 1223  BP: (!) 128/97 (!) 147/127 (!) 153/140 (!) 180/105  Pulse: (!) 104  (!) 135 72  Resp: 19 18 18 15   Temp: 98.6 F (37 C) 98.4 F (36.9 C) 98.7 F (37.1 C) 98.5 F (36.9 C)  TempSrc: Oral Oral  Oral  SpO2: 97% 96% 98% 98%  Weight:      Height:       Physical Exam: General: Male, appears older than stated age, lying in bed, NAD. Eyes: anicteric CV: normal rate and regular rhythm, no m/r/g, no JVD Pulm: Crackles heard in left lower base, otherwise coarse breath sounds bilaterally. Abdomen: soft, nondistended, nontender, +BS. Inguinal hernia present, chronic. MSK: no edema bilaterally Skin: warm and dry, lump noted on lateral left side along mid-axillary line  (chronic). Neuro: no focal deficits noted   RUQ U/S 12/25/19: IMPRESSION: 1. The common bile duct is mildly dilated measuring 6.9 mm. 6 mm is typically considered the upper limits of normal. Recommend clinical correlation. If there is concern for obstruction, recommend MRCP or ERCP for better evaluation. 2. The gallbladder is normal in appearance. 3. Mildly irregular contour to the liver consistent with the reported history of cirrhosis. No focal masses.  Assessment/Plan:  Principal Problem:   Acute on chronic heart failure (HCC) Active Problems:   Tobacco abuse   Atrial flutter with rapid ventricular response (HCC)   Alcohol withdrawal ([place])   E. coli UTI  Austin Cobb is a 59 year old male with a past medical history of tobacco use disorder, alcohol use disorder, bipolar 1 disorder, GERD, atrial fibrillation,  and systolic heart failure (EF 25-30%) presents with dyspnea on exertion and fatigue over 1 month.   #Acute on chronic heart failure  Patient has been noncompliant with his medications for heart failure and is presenting with a elevated BNP compared to last admission, small right pleural effusion on chest x-ray, and shortness of breath.  Endorsing orthopnea and PND at home. TEE on 05/20/2019, EF noted to be 25 to 30% with global hypokineses. Right/Left heart cath on 3/9 showed nonobstructive mild coronary disease. Heart failure thought to be secondary to tachycardia vs alcohol use disorder vs cocaine use (UDS + for cocaine). Producing good UOP today after lasix challenge yesterday  Patient in Atrial flutter  on admission (see below).  -resumed entresto and spironolactone -initially held coreg in the setting of acute decompensated heart failure (was not taking at home), will restart today at low dose 3.125 mg BID with no sign of shock -Daily weights -strict I/O's -f/u ECHO   #Atrial Flutter Consulted on-call cardiologist yesterday evening, who recommended starting digoxin  with a loading dose of 500 MCG followed by 250 MCG q6h.  - Digoxin load followed by digoxin as described above - Apixaban for [facility] - Telemetry still showing 2:1 A flutter - Greatly appreciate cardiology recs, resuming low dose carvedilol as above - PT/INR 19.9 / 1.8   #Alcohol use disorder Patient has a history of drinking 2x 40 ounce beers per day, with last notable drink consumed 1-3 days ago. On CIWA protocol without ativan at this time, as it is difficult to assess driving factor behind increased CIWA score given concomitant A flutter. Will have nursing staff call IMTS for ativan so that we can reassess patient and see if it is truly required. - CIWA - Thiamine, Folate, MV - UDS + for cocaine - started on regular diet today   #Alcoholic hepatitis vs alcoholic cirrhosis Patient with a history of alcohol use disorder with elevated AST>ALT, not quite 2 to 1 ratio and a little high for alcoholic hepatitis. Enzymes elevated greater than 6 months ago on last check.  Bilirubin also elevated. Lipase normal. Mild chronic thrombocytopenia. No abdominal pain.  - Trend CMP - RUQ U/S showing mildly irregular contour of liver, in line with possible cirrhosis - MELD score 22 - will consider adding lactulose therapy if evidence of encephalopathy   #AKI  Baseline Cr appears to be around 1.1, patient elevated to 1.53 on admission. Patient in atrial flutter and heart failure. -Cr 1.52 today, relatively unchanged from what it was at admission -Will continue diuresis to optimize volume status   #Tobacco use disorder - Nicotine patch   #Dysuria, history of E.Coli UTI - UA showing large leuks, few bacteria, WBC 21-50 >> appears to be pyuria - no indication for antibiotics at this time as patient is not complaining of abdominal pain, dysuria, urinary frequency   #Thrombocytopenia #Anemia - Trend CBC   Prior to Admission Living Arrangement: Home Anticipated Discharge Location: TBD Barriers to  Discharge: continued medical management Dispo: TBD   Virl Axe, MD 12/25/2019, 2:50 PM Pager: [phone] After 5pm on weekdays and 1pm on weekends: On Call pager [phone]

## 2019-12-25 NOTE — Progress Notes (Signed)
Given patient has multiple cormidities we are taking special attempt not to oversedate this patient so his other conditions can be accessed accurately. He is at moderate to high risk of withdrawal .Patient scoring 3 and most recently 7 on CIWA. Given high risk of withdrawal will start on Librium. He is on CIWA without ativan sliding scale. We will put in orders for q6 ativan for CIWA greater than 10. RN should call physician on call if patient is requiring more.   Tamsen Snider, MD Pager: [phone] After 5pm on weekdays and 1pm on weekends: On Call pager [phone]

## 2019-12-25 NOTE — Progress Notes (Signed)
°  Chart reviewed extensively. Discussed with Drs. Acahrya and Mclean.  59 y/o male with polysubstance abuse, severe systolic/biventricular HF due to NICM, bipolar d/o.   Admitted in 3/21 with AFL and severe HF - EF 20% with severe RV dysfunction. Had cath with no significant CAD underwent TEE/DC-CV. Required transient milrinone. Had ETOH withdrawal. HF felt to be due to tachy-induced vs ETOH.   Follow-up arranged in HF Clinic but patient did not make any appointments.   Readmitted overnight with recurrent HF and AFL in setting of ongoing ETOH use, recent cocaine use and medication noncompliance.  Seems to be responding to IV diurese and reinstitution of HF therapies.   Given ongoing substance abuse, medication non-compliance and lack of f/u patient is not a candidate for advanced HF therapies. Agree with current management. The AHF team will remain available for curbside consultation to assist with management but will not assume cardiac care.  Glori Bickers, MD  3:43 PM

## 2019-12-25 NOTE — Progress Notes (Signed)
Pt is scoring a 7 CIWA, pt had large episode of vomiting and worsening tremors.  MD paged requesting ativan  MD doesn't want to place pt on CIWA ativan scale at this time wanting staff to page separately when pt needs ativan or to give dose in mar only if sezuire activity is taking place.   Will continue to monitor

## 2019-12-25 NOTE — Progress Notes (Signed)
   12/25/19 1944  Assess: MEWS Score  Temp 98.5 F (36.9 C)  BP (!) 115/99  Pulse Rate (!) 110  ECG Heart Rate (!) 127  Resp 16  SpO2 100 %  O2 Device Room Air  Assess: MEWS Score  MEWS Temp 0  MEWS Systolic 0  MEWS Pulse 2  MEWS RR 0  MEWS LOC 0  MEWS Score 2  MEWS Score Color Yellow  Assess: if the MEWS score is Yellow or Red  Were vital signs taken at a resting state? Yes  Focused Assessment No change from prior assessment  Early Detection of Sepsis Score *See Row Information* Low  MEWS guidelines implemented *See Row Information* No, previously yellow, continue vital signs every 4 hours  Treat  MEWS Interventions Escalated (See documentation below)  Pain Scale 0-10  Pain Score 0  Escalate  MEWS: Escalate Yellow: discuss with charge nurse/RN and consider discussing with provider and RRT  Notify: Charge Nurse/RN  Name of Charge Nurse/RN Notified Fred  Date Charge Nurse/RN Notified 12/25/19  Time Charge Nurse/RN Notified 2028  Document  Patient Outcome Other (Comment)  Progress note created (see row info) Yes  Chronic yellow MEWS

## 2019-12-25 NOTE — Progress Notes (Signed)
CIWA score worsening despite PRN ativan (see MAR). Agitation, tremor and nausea increasing. MD made aware of current CIWA score (14). MD assessed patient. See new orders.

## 2019-12-25 NOTE — Progress Notes (Signed)
  Echocardiogram 2D Echocardiogram has been performed.  Austin Cobb 12/25/2019, 4:36 PM

## 2019-12-25 NOTE — Progress Notes (Signed)
Progress Note  Patient Name: Austin Cobb Date of Encounter: 12/25/2019  Primary Cardiologist: Donato Heinz, MD   Subjective   Continues to feel short of breath, but objectively without increased work of breathing. States he is 25% better.   Inpatient Medications    Scheduled Meds: . apixaban  5 mg Oral BID  . aspirin EC  81 mg Oral Daily  . folic acid  1 mg Oral Daily  . multivitamin with minerals  1 tablet Oral Daily  . sacubitril-valsartan  1 tablet Oral BID  . sodium chloride flush  3 mL Intravenous Q12H  . spironolactone  12.5 mg Oral Daily  . thiamine  100 mg Oral Daily   Or  . thiamine  100 mg Intravenous Daily   Continuous Infusions: . sodium chloride     PRN Meds: sodium chloride, LORazepam, ondansetron (ZOFRAN) IV, sodium chloride flush   Vital Signs    Vitals:   12/25/19 0100 12/25/19 0200 12/25/19 0401 12/25/19 0835  BP:  (!) 128/97 (!) 147/127 (!) 153/140  Pulse:  (!) 104  (!) 135  Resp:  19 18 18   Temp:  98.6 F (37 C) 98.4 F (36.9 C) 98.7 F (37.1 C)  TempSrc:  Oral Oral   SpO2:  97% 96% 98%  Weight: 61.6 kg     Height:        Intake/Output Summary (Last 24 hours) at 12/25/2019 1031 Last data filed at 12/25/2019 0440 Gross per 24 hour  Intake 219.9 ml  Output 1675 ml  Net -1455.1 ml   Filed Weights   12/24/19 1323 12/25/19 0100  Weight: 61.2 kg 61.6 kg    Telemetry    Atrial flutter - Personally Reviewed  ECG    Atrial flutter rate 134 - Personally Reviewed  Physical Exam   GEN: No acute distress.   Neck: JVD to mid 1/3 of neck at 90 deg. Cardiac: irregular, tachycardic Respiratory: diffusely coarse, basal crackles GI: Soft, nontender, non-distended  MS: No edema; No deformity. Neuro:  Nonfocal  Psych: Normal affect   Labs    Chemistry Recent Labs  Lab 12/24/19 1345 12/25/19 0014  NA 134* 133*  K 4.0 4.1  CL 104 102  CO2 19* 20*  GLUCOSE 151* 92  BUN 22* 24*  CREATININE 1.53* 1.52*  CALCIUM  8.4* 8.5*  PROT 5.9* 6.7  ALBUMIN 3.1* 3.3*  AST 405* 383*  ALT 270* 283*  ALKPHOS 97 117  BILITOT 2.0* 1.7*  GFRNONAA 49* 49*  ANIONGAP 11 11     Hematology Recent Labs  Lab 12/24/19 1345 12/25/19 0014  WBC 6.8 7.8  RBC 3.60* 3.69*  HGB 11.1* 11.9*  HCT 33.8* 34.6*  MCV 93.9 93.8  MCH 30.8 32.2  MCHC 32.8 34.4  RDW 14.6 14.7  PLT 139* 152    Cardiac EnzymesNo results for input(s): TROPONINI in the last 168 hours. No results for input(s): TROPIPOC in the last 168 hours.   BNP Recent Labs  Lab 12/24/19 1345  BNP 2,812.2*     DDimer No results for input(s): DDIMER in the last 168 hours.   Radiology    DG Chest Port 1 View  Result Date: 12/24/2019 CLINICAL DATA:  Increasing shortness of breath over the past week. EXAM: PORTABLE CHEST 1 VIEW COMPARISON:  PA and lateral chest 06/16/2019. FINDINGS: There is a small right pleural effusion and mild basilar atelectasis. Left lung is clear. No pneumothorax. Heart size is upper normal. No acute or focal bony  abnormality. IMPRESSION: Small right pleural effusion and basilar atelectasis are new since the prior exam. Electronically Signed   By: Inge Rise M.D.   On: 12/24/2019 14:36   US Abdomen Limited RUQ (LIVER/GB)  Result Date: 12/25/2019 CLINICAL DATA:  Cirrhosis EXAM: ULTRASOUND ABDOMEN LIMITED RIGHT UPPER QUADRANT COMPARISON:  None. FINDINGS: Gallbladder: No gallstones or wall thickening visualized. No sonographic Murphy sign noted by sonographer. Common bile duct: Diameter: 6.9 mm Liver: Mildly irregular contour. Increased echogenicity. No focal mass. Portal vein is patent on color Doppler imaging with normal direction of blood flow towards the liver. Other: None. IMPRESSION: 1. The common bile duct is mildly dilated measuring 6.9 mm. 6 mm is typically considered the upper limits of normal. Recommend clinical correlation. If there is concern for obstruction, recommend MRCP or ERCP for better evaluation. 2. The  gallbladder is normal in appearance. 3. Mildly irregular contour to the liver consistent with the reported history of cirrhosis. No focal masses. Electronically Signed   By: Dorise Bullion III M.D   On: 12/25/2019 10:14    Cardiac Studies   Repeat echo pending.  Patient Profile     59 y.o. male admitted with shortness of breath, 1 week and progressive orthopnea, PND, abdominal swelling in the setting of known nonischemic cardiomyopathy felt secondary to polysubstance abuse (EtOH and cocaine), nonobstructive CAD on coronary angiogram 05/18/2019, and known biventricular heart failure.  Followed in hospital on last admission by advanced heart failure service.  Assessment & Plan   Principal Problem:   Acute on chronic heart failure (HCC) Active Problems:   Tobacco abuse   Atrial flutter with rapid ventricular response (HCC)   Alcohol withdrawal (HCC)   E. coli UTI   Biventricular failure/nonischemic cardiomyopathy -Patient presents with 1 week of shortness of breath and told his nurse "these are my typical heart issues".  Used cocaine night prior to admission.  He is warm on exam and does not appear to be in cardiogenic shock.  Diffuse coarse breath sounds and continued elevation of JVP suggest volume overload, will administer furosemide 40 mg IV once now and monitor for response.  He received oral Lasix doses yesterday with 1.7 L output and incomplete assessment of I's and O's.   -Lactate 2 on admission, improving now 1 with diuresis. - CO2 low, stable. -Patient has been dig loaded, digoxin level per pharmacy. -Continue Entresto 24-26 mg twice daily - Continue spironolactone 12.5 mg daily -Primary service has ordered an echocardiogram, will follow.  Persistent atrial flutter -We will resume Coreg today, monitor for hemodynamic compromise.  Based on exam today I think he will tolerate this well. -Substance use limits treatment options, consider TEE cardioversion when euvolemic.  Atrial  flutter ablation has been discussed previously, can be evaluated by EP in hospital, though flutter ablation is likely contingent on improvement in substance use disorder.  Mild coronary artery disease- No indication for aspirin 81 mg daily with nonobstructive CAD in the setting of Eliquis use.  Discontinue aspirin.  For questions or updates, please contact [place] Please consult [email or web address] for contact info under        Signed, Elouise Munroe, MD  12/25/2019, 10:31 AM

## 2019-12-26 DIAGNOSIS — I5043 Acute on chronic combined systolic (congestive) and diastolic (congestive) heart failure: Secondary | ICD-10-CM | POA: Diagnosis not present

## 2019-12-26 LAB — CBC WITH DIFFERENTIAL/PLATELET
Abs Immature Granulocytes: 0.02 10*3/uL (ref 0.00–0.07)
Basophils Absolute: 0.1 10*3/uL (ref 0.0–0.1)
Basophils Relative: 2 %
Eosinophils Absolute: 0.3 10*3/uL (ref 0.0–0.5)
Eosinophils Relative: 5 %
HCT: 38.3 % — ABNORMAL LOW (ref 39.0–52.0)
Hemoglobin: 12.8 g/dL — ABNORMAL LOW (ref 13.0–17.0)
Immature Granulocytes: 0 %
Lymphocytes Relative: 22 %
Lymphs Abs: 1.5 10*3/uL (ref 0.7–4.0)
MCH: 30.7 pg (ref 26.0–34.0)
MCHC: 33.4 g/dL (ref 30.0–36.0)
MCV: 91.8 fL (ref 80.0–100.0)
Monocytes Absolute: 0.8 10*3/uL (ref 0.1–1.0)
Monocytes Relative: 11 %
Neutro Abs: 4.1 10*3/uL (ref 1.7–7.7)
Neutrophils Relative %: 60 %
Platelets: 149 10*3/uL — ABNORMAL LOW (ref 150–400)
RBC: 4.17 MIL/uL — ABNORMAL LOW (ref 4.22–5.81)
RDW: 14.3 % (ref 11.5–15.5)
WBC: 6.9 10*3/uL (ref 4.0–10.5)
nRBC: 0 % (ref 0.0–0.2)

## 2019-12-26 LAB — COMPREHENSIVE METABOLIC PANEL
ALT: 198 U/L — ABNORMAL HIGH (ref 0–44)
AST: 185 U/L — ABNORMAL HIGH (ref 15–41)
Albumin: 2.8 g/dL — ABNORMAL LOW (ref 3.5–5.0)
Alkaline Phosphatase: 88 U/L (ref 38–126)
Anion gap: 9 (ref 5–15)
BUN: 25 mg/dL — ABNORMAL HIGH (ref 6–20)
CO2: 35 mmol/L — ABNORMAL HIGH (ref 22–32)
Calcium: 8.8 mg/dL — ABNORMAL LOW (ref 8.9–10.3)
Chloride: 94 mmol/L — ABNORMAL LOW (ref 98–111)
Creatinine, Ser: 1.42 mg/dL — ABNORMAL HIGH (ref 0.61–1.24)
GFR, Estimated: 53 mL/min — ABNORMAL LOW (ref >60)
Glucose, Bld: 146 mg/dL — ABNORMAL HIGH (ref 70–99)
Potassium: 3.3 mmol/L — ABNORMAL LOW (ref 3.5–5.1)
Sodium: 138 mmol/L (ref 135–145)
Total Bilirubin: 1 mg/dL (ref 0.3–1.2)
Total Protein: 5.8 g/dL — ABNORMAL LOW (ref 6.5–8.1)

## 2019-12-26 LAB — PHOSPHORUS: Phosphorus: 3.7 mg/dL (ref 2.5–4.6)

## 2019-12-26 LAB — MAGNESIUM: Magnesium: 1.8 mg/dL (ref 1.7–2.4)

## 2019-12-26 MED ORDER — CHLORDIAZEPOXIDE HCL 25 MG PO CAPS
25.0000 mg | ORAL_CAPSULE | Freq: Two times a day (BID) | ORAL | Status: DC
  Administered 2019-12-26 (×2): 25 mg via ORAL
  Filled 2019-12-26 (×2): qty 1

## 2019-12-26 MED ORDER — POTASSIUM CHLORIDE 10 MEQ/100ML IV SOLN
10.0000 meq | INTRAVENOUS | Status: DC
  Administered 2019-12-26 (×2): 10 meq via INTRAVENOUS
  Filled 2019-12-26 (×2): qty 100

## 2019-12-26 MED ORDER — CARVEDILOL 6.25 MG PO TABS
6.2500 mg | ORAL_TABLET | Freq: Two times a day (BID) | ORAL | Status: DC
  Administered 2019-12-26 – 2019-12-28 (×4): 6.25 mg via ORAL
  Filled 2019-12-26 (×4): qty 1

## 2019-12-26 MED ORDER — POTASSIUM CHLORIDE CRYS ER 20 MEQ PO TBCR
40.0000 meq | EXTENDED_RELEASE_TABLET | Freq: Once | ORAL | Status: AC
  Administered 2019-12-26: 40 meq via ORAL
  Filled 2019-12-26: qty 2

## 2019-12-26 MED ORDER — INFLUENZA VAC SPLIT QUAD 0.5 ML IM SUSY
0.5000 mL | PREFILLED_SYRINGE | INTRAMUSCULAR | Status: AC
  Administered 2019-12-27: 0.5 mL via INTRAMUSCULAR
  Filled 2019-12-26: qty 0.5

## 2019-12-26 NOTE — Progress Notes (Signed)
   12/26/19 1459  Assess: MEWS Score  Temp 98 F (36.7 C)  BP 109/87  Pulse Rate (!) 143  Resp 18  Level of Consciousness Alert  SpO2 98 %  O2 Device Room Air  Assess: MEWS Score  MEWS Temp 0  MEWS Systolic 0  MEWS Pulse 3  MEWS RR 0  MEWS LOC 0  MEWS Score 3  MEWS Score Color Yellow  Assess: if the MEWS score is Yellow or Red  Were vital signs taken at a resting state? Yes  Focused Assessment No change from prior assessment  Early Detection of Sepsis Score *See Row Information* Low  MEWS guidelines implemented *See Row Information* Yes  Treat  MEWS Interventions Escalated (See documentation below)  Pain Scale 0-10  Pain Score 0  Take Vital Signs  Increase Vital Sign Frequency  Yellow: Q 2hr X 2 then Q 4hr X 2, if remains yellow, continue Q 4hrs  Escalate  MEWS: Escalate Yellow: discuss with charge nurse/RN and consider discussing with provider and RRT  Notify: Charge Nurse/RN  Name of Charge Nurse/RN Notified JAsmina RN  Date Charge Nurse/RN Notified 12/26/19  Time Charge Nurse/RN Notified [place]  Notify: Provider  Provider Name/Title Dr. Margaretann Loveless  Date Provider Notified 12/26/19  Time Provider Notified 1502  Notification Type Page  Notification Reason Change in status  Response Other (Comment) (Give Coreg dose early)  Date of Provider Response 12/26/19  Time of Provider Response 1503  Document  Patient Outcome Other (Comment) (will monitor)  Progress note created (see row info) Yes   Patient HR sustaining in the 140s. MD notified and orders given to give 1630 dose of coreg early. Patient helped to relax in bed and will continue to monitor CIWA as well as HR. Patient in no distress but will increase vitals for yellow MEWS and give PRN meds as needed

## 2019-12-26 NOTE — Progress Notes (Signed)
Progress Note  Patient Name: Austin Cobb Date of Encounter: 12/26/2019  Primary Cardiologist: Donato Heinz, MD   Subjective   Sleeping after therapies for CIWA protocol  Inpatient Medications    Scheduled Meds: . apixaban  5 mg Oral BID  . aspirin EC  81 mg Oral Daily  . carvedilol  3.125 mg Oral BID WC  . chlordiazePOXIDE  25 mg Oral BID  . folic acid  1 mg Oral Daily  . [START ON 12/27/2019] influenza vac split quadrivalent PF  0.5 mL Intramuscular Tomorrow-1000  . LORazepam  2 mg Intravenous Once  . multivitamin with minerals  1 tablet Oral Daily  . sacubitril-valsartan  1 tablet Oral BID  . sodium chloride flush  3 mL Intravenous Q12H  . spironolactone  12.5 mg Oral Daily  . thiamine  100 mg Oral Daily   Or  . thiamine  100 mg Intravenous Daily   Continuous Infusions: . sodium chloride    . potassium chloride 10 mEq (12/26/19 0828)   PRN Meds: sodium chloride, alum & mag hydroxide-simeth, LORazepam **OR** LORazepam, ondansetron (ZOFRAN) IV, sodium chloride flush   Vital Signs    Vitals:   12/26/19 0014 12/26/19 0400 12/26/19 0623 12/26/19 0829  BP: 139/89 129/85    Pulse: 72   (!) 101  Resp: 16 14    Temp: 98.7 F (37.1 C) 98.5 F (36.9 C)    TempSrc: Oral Oral    SpO2: 96% 100%    Weight:   58.3 kg   Height:        Intake/Output Summary (Last 24 hours) at 12/26/2019 0833 Last data filed at 12/25/2019 2234 Gross per 24 hour  Intake 463 ml  Output 850 ml  Net -387 ml   Filed Weights   12/24/19 1323 12/25/19 0100 12/26/19 0623  Weight: 61.2 kg 61.6 kg 58.3 kg    Telemetry    Atrial flutter rates 90-100 - Personally Reviewed  ECG    Atrial flutter rate 134 - Personally Reviewed  Physical Exam   GEN: sleeping Neck: jvd - none appreciated lying on side sleeping Cardiac: irregular rhythm, normal rate. Respiratory: Clear to auscultation bilaterally. GI: Soft, nontender, non-distended  MS: No edema; No deformity.  Extremities warm. Neuro:  can't assess  Psych: sleeping   Labs    Chemistry Recent Labs  Lab 12/24/19 1345 12/25/19 0014 12/26/19 0257  NA 134* 133* 138  K 4.0 4.1 3.3*  CL 104 102 94*  CO2 19* 20* 35*  GLUCOSE 151* 92 146*  BUN 22* 24* 25*  CREATININE 1.53* 1.52* 1.42*  CALCIUM 8.4* 8.5* 8.8*  PROT 5.9* 6.7 5.8*  ALBUMIN 3.1* 3.3* 2.8*  AST 405* 383* 185*  ALT 270* 283* 198*  ALKPHOS 97 117 88  BILITOT 2.0* 1.7* 1.0  GFRNONAA 49* 49* 53*  ANIONGAP 11 11 9      Hematology Recent Labs  Lab 12/24/19 1345 12/25/19 0014 12/26/19 0636  WBC 6.8 7.8 6.9  RBC 3.60* 3.69* 4.17*  HGB 11.1* 11.9* 12.8*  HCT 33.8* 34.6* 38.3*  MCV 93.9 93.8 91.8  MCH 30.8 32.2 30.7  MCHC 32.8 34.4 33.4  RDW 14.6 14.7 14.3  PLT 139* 152 149*    Cardiac EnzymesNo results for input(s): TROPONINI in the last 168 hours. No results for input(s): TROPIPOC in the last 168 hours.   BNP Recent Labs  Lab 12/24/19 1345  BNP 2,812.2*     DDimer No results for input(s): DDIMER in the last  168 hours.   Radiology    DG Chest Port 1 View  Result Date: 12/24/2019 CLINICAL DATA:  Increasing shortness of breath over the past week. EXAM: PORTABLE CHEST 1 VIEW COMPARISON:  PA and lateral chest 06/16/2019. FINDINGS: There is a small right pleural effusion and mild basilar atelectasis. Left lung is clear. No pneumothorax. Heart size is upper normal. No acute or focal bony abnormality. IMPRESSION: Small right pleural effusion and basilar atelectasis are new since the prior exam. Electronically Signed   By: Inge Rise M.D.   On: 12/24/2019 14:36   ECHOCARDIOGRAM COMPLETE  Result Date: 12/25/2019    ECHOCARDIOGRAM REPORT   Patient Name:   Austin Cobb Date of Exam: 12/25/2019 Medical Rec #:  [id number]         Height:       70.0 in Accession #:    [id number]        Weight:       135.7 lb Date of Birth:  [birth year]          BSA:          1.770 m Patient Age:    59 years          BP:            155/73 mmHg Patient Gender: M                 HR:           110 bpm. Exam Location:  Inpatient Procedure: 2D Echo, Cardiac Doppler and Color Doppler Indications:    CHF-Acute systolic  History:        Patient has prior history of Echocardiogram examinations, most                 recent 05/20/2019. CAD, Arrythmias:Atrial Fibrillation;                 Signs/Symptoms:Shortness of Breath. Polysubstance abuse. GERD.                 DOE.  Sonographer:    Clayton Lefort RDCS (AE) Referring Phys: [place]  1. Left ventricular ejection fraction, by estimation, is 25 to 30%. The left ventricle has severely decreased function. The left ventricle demonstrates global hypokinesis. There is moderate left ventricular hypertrophy. Left ventricular diastolic function could not be evaluated. There is septal dyssynchrony.  2. Right ventricular systolic function is mildly reduced. The right ventricular size is normal. Tricuspid regurgitation signal is inadequate for assessing PA pressure.  3. The mitral valve is grossly normal. Trivial mitral valve regurgitation.  4. The aortic valve is tricuspid. Aortic valve regurgitation is not visualized. Aortic valve mean gradient measures 2.0 mmHg.  5. The inferior vena cava is normal in size with greater than 50% respiratory variability, suggesting right atrial pressure of 3 mmHg. Comparison(s): No significant change from prior study. 05/20/2019: LVEF 25-30%, global hypokinesis. FINDINGS  Left Ventricle: Left ventricular ejection fraction, by estimation, is 25 to 30%. The left ventricle has severely decreased function. The left ventricle demonstrates global hypokinesis. The left ventricular internal cavity size was normal in size. There is moderate left ventricular hypertrophy. Septal dyssynchrony. Left ventricular diastolic function could not be evaluated due to atrial fibrillation. Left ventricular diastolic function could not be evaluated. Right Ventricle: The right  ventricular size is normal. No increase in right ventricular wall thickness. Right ventricular systolic function is mildly reduced. Tricuspid regurgitation signal is inadequate for assessing PA pressure. Left Atrium: Left atrial  size was normal in size. Right Atrium: Right atrial size was normal in size. Pericardium: There is no evidence of pericardial effusion. Mitral Valve: The mitral valve is grossly normal. Trivial mitral valve regurgitation. Tricuspid Valve: The tricuspid valve is grossly normal. Tricuspid valve regurgitation is trivial. Aortic Valve: The aortic valve is tricuspid. Aortic valve regurgitation is not visualized. Aortic valve mean gradient measures 2.0 mmHg. Aortic valve peak gradient measures 3.0 mmHg. Aortic valve area, by VTI measures 3.01 cm. Pulmonic Valve: The pulmonic valve was normal in structure. Pulmonic valve regurgitation is not visualized. Aorta: The aortic root and ascending aorta are structurally normal, with no evidence of dilitation. Venous: The inferior vena cava is normal in size with greater than 50% respiratory variability, suggesting right atrial pressure of 3 mmHg. IAS/Shunts: The interatrial septum was not assessed.  LEFT VENTRICLE PLAX 2D LVIDd:         4.50 cm LVIDs:         3.80 cm LV PW:         1.60 cm LV IVS:        1.40 cm LVOT diam:     2.10 cm LV SV:         33 LV SV Index:   19 LVOT Area:     3.46 cm  RIGHT VENTRICLE            IVC RV Basal diam:  3.10 cm    IVC diam: 1.30 cm RV S prime:     6.38 cm/s TAPSE (M-mode): 1.1 cm LEFT ATRIUM           Index       RIGHT ATRIUM           Index LA diam:      3.60 cm 2.03 cm/m  RA Area:     15.60 cm LA Vol (A2C): 45.2 ml 25.54 ml/m RA Volume:   36.00 ml  20.34 ml/m LA Vol (A4C): 54.2 ml 30.62 ml/m  AORTIC VALVE AV Area (Vmax):    2.88 cm AV Area (Vmean):   2.84 cm AV Area (VTI):     3.01 cm AV Vmax:           87.10 cm/s AV Vmean:          62.825 cm/s AV VTI:            0.110 m AV Peak Grad:      3.0 mmHg AV Mean  Grad:      2.0 mmHg LVOT Vmax:         72.45 cm/s LVOT Vmean:        51.550 cm/s LVOT VTI:          0.096 m LVOT/AV VTI ratio: 0.87  AORTA Ao Root diam: 3.70 cm Ao Asc diam:  3.40 cm  SHUNTS Systemic VTI:  0.10 m Systemic Diam: 2.10 cm Lyman Bishop MD Electronically signed by Lyman Bishop MD Signature Date/Time: 12/25/2019/5:53:38 PM    Final    US Abdomen Limited RUQ (LIVER/GB)  Result Date: 12/25/2019 CLINICAL DATA:  Cirrhosis EXAM: ULTRASOUND ABDOMEN LIMITED RIGHT UPPER QUADRANT COMPARISON:  None. FINDINGS: Gallbladder: No gallstones or wall thickening visualized. No sonographic Murphy sign noted by sonographer. Common bile duct: Diameter: 6.9 mm Liver: Mildly irregular contour. Increased echogenicity. No focal mass. Portal vein is patent on color Doppler imaging with normal direction of blood flow towards the liver. Other: None. IMPRESSION: 1. The common bile duct is mildly dilated measuring 6.9 mm. 6 mm  is typically considered the upper limits of normal. Recommend clinical correlation. If there is concern for obstruction, recommend MRCP or ERCP for better evaluation. 2. The gallbladder is normal in appearance. 3. Mildly irregular contour to the liver consistent with the reported history of cirrhosis. No focal masses. Electronically Signed   By: Dorise Bullion III M.D   On: 12/25/2019 10:14    Cardiac Studies   Repeat echo pending.  Patient Profile     59 y.o. male admitted with shortness of breath, 1 week and progressive orthopnea, PND, abdominal swelling in the setting of known nonischemic cardiomyopathy felt secondary to polysubstance abuse (EtOH and cocaine), nonobstructive CAD on coronary angiogram 05/18/2019, and known biventricular heart failure.  Followed in hospital on last admission by advanced heart failure service.  Assessment & Plan   Principal Problem:   Acute on chronic combined systolic and diastolic CHF (congestive heart failure) (HCC) Active Problems:   Tobacco abuse    Atrial flutter with rapid ventricular response (HCC)   Alcohol withdrawal (HCC)   Polysubstance abuse (HCC)   Acute on chronic heart failure (HCC)   Biventricular failure/nonischemic cardiomyopathy -echo stable, EF 25-30% -patient not an optimal candidate for digoxin given inconsistent medication usage and follow up. Will dc at this time, Can add back if needed for rate control.  -Continue Entresto 24-26 mg twice daily - Continue spironolactone 12.5 mg daily - increase coreg to 6.25 mg BID. - appears euvolemic, -2.5 L yesterday. Unable to get strict I/O. Follow clinically. No lasix today.  Persistent atrial flutter -uptitrate coreg, 6.25 mg BID. -Substance use limits treatment options, consider TEE cardioversion when euvolemic.  Atrial flutter ablation has been discussed previously, can be evaluated by EP in hospital, though flutter ablation is likely contingent on improvement in substance use disorder.  Mild coronary artery disease- No indication for aspirin 81 mg daily with nonobstructive CAD in the setting of Eliquis use.  Discontinue aspirin.  For questions or updates, please contact [place] Please consult [email or web address] for contact info under        Signed, Elouise Munroe, MD  12/26/2019, 8:33 AM

## 2019-12-26 NOTE — Progress Notes (Signed)
Subjective:   Overnight patient scored 11 and 14 on CIWA. Patient on Librium and given 3 mg Lorazepam. Overnight team reports patient vomited and had some coffee ground emesis.   This morning patient is resting in bed comfortably. We discussed current treatment. Denies any acute concerns, chest pain, or nausea.    Objective:  Vital signs in last 24 hours: Vitals:   12/25/19 1658 12/25/19 1944 12/26/19 0014 12/26/19 0400  BP: (!) 122/96 (!) 115/99 139/89 129/85  Pulse: 100 (!) 110 72   Resp: 19 16 16 14   Temp: 97.6 F (36.4 C) 98.5 F (36.9 C) 98.7 F (37.1 C) 98.5 F (36.9 C)  TempSrc: Oral Oral Oral Oral  SpO2: 100% 100% 96% 100%  Weight:      Height:       Physical Exam: General: Male, appears older than stated age, lying in bed, NAD. CV: irregular rhythm , nl rate Pulm: CTAB, normal effort Psych: appears sleepy, cooperative on exam.   RUQ U/S 12/25/19: IMPRESSION: 1. The common bile duct is mildly dilated measuring 6.9 mm. 6 mm is typically considered the upper limits of normal. Recommend clinical correlation. If there is concern for obstruction, recommend MRCP or ERCP for better evaluation. 2. The gallbladder is normal in appearance. 3. Mildly irregular contour to the liver consistent with the reported history of cirrhosis. No focal masses.  Assessment/Plan:  Principal Problem:   Acute on chronic combined systolic and diastolic CHF (congestive heart failure) (HCC) Active Problems:   Tobacco abuse   Atrial flutter with rapid ventricular response (HCC)   Alcohol withdrawal (HCC)   Polysubstance abuse ([place])   Acute on chronic heart failure (HCC)  Austin Cobb is a 59 year old male with a past medical history of tobacco use disorder, alcohol use disorder, bipolar 1 disorder, GERD, atrial fibrillation,  and systolic heart failure (EF 25-30%) presents with dyspnea on exertion and fatigue over 1 month.   #Acute on chronic heart failure  Echo on 10/16  consistent with previous TTE, EF 25-30% with global hypokinesis. Appreciate HF cardiology seeing patient. He is not a candidate for advance interventions at this time due to his non-compliance and recent cocaine use.  -Continue Entresto, spirolactone  - Increase Coreg to 6.25  -Daily weights -strict I/O's  #Atrial Flutter Patient loaded with digoxin. Given patient is inconsistent with taking medication it was recommended to stop digoxin today. Conitnues to be in 2 to 1 atrial flutter on telemetry review, rate controlled - Greatly appreciate cardiology consult, see there recommendations  - Continue Apixaban 5 mg BID - Coreg as above  #Alcohol use disorder Patient starting to withdrawal overnight. On Librium and CIWA with Ativan PRN for score above 10. Modified normal protocol to prevent oversedation. Will monitor for refeeding syndrome.  - Librium 25 mg q12 hours - CIWA - Thiamine, Folate, MV - CMP, Mg, P  #Alcoholic hepatitis vs alcoholic cirrhosis RUQ [place] consistent with cirrhosis ,also showed dilated common bile duct. ( 60mm  Is nl, patients 6.38mm) . Liver enzymes trending down. MELD score 22. INR 1.8 yesterday.  - Trend CMP and INR - will consider adding lactulose therapy if evidence of encephalopathy   #Elevated Cr, AKI?  Likely prenrenal azotemia. Baseline Cr appears to be around 1.1, but was around 1.5 3 months ago. Trending down on admission. Today 1.4.   #Tobacco use disorder - Nicotine patch   #Dysuria, history of E.Coli UTI - UA showing large leuks, few bacteria, WBC 21-50 >> appears to be  pyuria - no indication for antibiotics at this time as patient is not complaining of abdominal pain, dysuria, urinary frequency   #Thrombocytopenia #Anemia - Hgb stable , platelets stable - Trend CBC   Prior to Admission Living Arrangement: Home Anticipated Discharge Location: TBD Barriers to Discharge: continued medical management Dispo: TBD   Madalyn Rob, MD 12/26/2019, 6:00  AM Pager: [phone] After 5pm on weekdays and 1pm on weekends: On Call pager [phone]

## 2019-12-26 NOTE — Progress Notes (Cosign Needed)
MSW Intern attempted SA consult with patient on 10/16 2xs; unable to assess.  TOC will continue to follow for discharge supports.

## 2019-12-27 DIAGNOSIS — K920 Hematemesis: Secondary | ICD-10-CM

## 2019-12-27 DIAGNOSIS — R8281 Pyuria: Secondary | ICD-10-CM

## 2019-12-27 DIAGNOSIS — I5043 Acute on chronic combined systolic (congestive) and diastolic (congestive) heart failure: Secondary | ICD-10-CM | POA: Diagnosis not present

## 2019-12-27 DIAGNOSIS — N1831 Chronic kidney disease, stage 3a: Secondary | ICD-10-CM

## 2019-12-27 LAB — CBC WITH DIFFERENTIAL/PLATELET
Abs Immature Granulocytes: 0.02 10*3/uL (ref 0.00–0.07)
Basophils Absolute: 0.1 10*3/uL (ref 0.0–0.1)
Basophils Relative: 2 %
Eosinophils Absolute: 0.4 10*3/uL (ref 0.0–0.5)
Eosinophils Relative: 6 %
HCT: 37.9 % — ABNORMAL LOW (ref 39.0–52.0)
Hemoglobin: 12.1 g/dL — ABNORMAL LOW (ref 13.0–17.0)
Immature Granulocytes: 0 %
Lymphocytes Relative: 27 %
Lymphs Abs: 1.9 10*3/uL (ref 0.7–4.0)
MCH: 30.6 pg (ref 26.0–34.0)
MCHC: 31.9 g/dL (ref 30.0–36.0)
MCV: 95.7 fL (ref 80.0–100.0)
Monocytes Absolute: 0.8 10*3/uL (ref 0.1–1.0)
Monocytes Relative: 12 %
Neutro Abs: 3.6 10*3/uL (ref 1.7–7.7)
Neutrophils Relative %: 53 %
Platelets: 148 10*3/uL — ABNORMAL LOW (ref 150–400)
RBC: 3.96 MIL/uL — ABNORMAL LOW (ref 4.22–5.81)
RDW: 15 % (ref 11.5–15.5)
WBC: 6.9 10*3/uL (ref 4.0–10.5)
nRBC: 0 % (ref 0.0–0.2)

## 2019-12-27 LAB — COMPREHENSIVE METABOLIC PANEL
ALT: 155 U/L — ABNORMAL HIGH (ref 0–44)
AST: 105 U/L — ABNORMAL HIGH (ref 15–41)
Albumin: 2.8 g/dL — ABNORMAL LOW (ref 3.5–5.0)
Alkaline Phosphatase: 82 U/L (ref 38–126)
Anion gap: 9 (ref 5–15)
BUN: 27 mg/dL — ABNORMAL HIGH (ref 6–20)
CO2: 31 mmol/L (ref 22–32)
Calcium: 9 mg/dL (ref 8.9–10.3)
Chloride: 98 mmol/L (ref 98–111)
Creatinine, Ser: 1.48 mg/dL — ABNORMAL HIGH (ref 0.61–1.24)
GFR, Estimated: 51 mL/min — ABNORMAL LOW (ref >60)
Glucose, Bld: 102 mg/dL — ABNORMAL HIGH (ref 70–99)
Potassium: 4.4 mmol/L (ref 3.5–5.1)
Sodium: 138 mmol/L (ref 135–145)
Total Bilirubin: 0.9 mg/dL (ref 0.3–1.2)
Total Protein: 6 g/dL — ABNORMAL LOW (ref 6.5–8.1)

## 2019-12-27 LAB — PHOSPHORUS: Phosphorus: 4.2 mg/dL (ref 2.5–4.6)

## 2019-12-27 LAB — PROTIME-INR
INR: 1.4 — ABNORMAL HIGH (ref 0.8–1.2)
Prothrombin Time: 16.7 seconds — ABNORMAL HIGH (ref 11.4–15.2)

## 2019-12-27 LAB — MAGNESIUM: Magnesium: 1.9 mg/dL (ref 1.7–2.4)

## 2019-12-27 MED ORDER — PANTOPRAZOLE SODIUM 40 MG PO TBEC
40.0000 mg | DELAYED_RELEASE_TABLET | Freq: Every day | ORAL | Status: DC
  Administered 2019-12-27 – 2019-12-30 (×4): 40 mg via ORAL
  Filled 2019-12-27 (×4): qty 1

## 2019-12-27 MED ORDER — CHLORDIAZEPOXIDE HCL 25 MG PO CAPS
25.0000 mg | ORAL_CAPSULE | Freq: Every day | ORAL | Status: DC
  Administered 2019-12-28: 25 mg via ORAL
  Filled 2019-12-27 (×2): qty 1

## 2019-12-27 MED ORDER — CHLORDIAZEPOXIDE HCL 25 MG PO CAPS: 25.0000 mg | ORAL_CAPSULE | Freq: Every day | ORAL | Status: DC

## 2019-12-27 NOTE — Progress Notes (Signed)
Heart Failure Patient Advocate Encounter  Sent in [employer] Assistance application to [employer] for [employer].   Phone:[phone] Fax: [phone]  Application pending  Kerby Nora, PharmD, BCPS Heart Failure Stewardship Pharmacist Phone [phone] 12/27/2019       1:10 PM  Please check [email or web address] for unit-specific pharmacist phone numbers

## 2019-12-27 NOTE — Progress Notes (Addendum)
Progress Note  Patient Name: Austin Cobb Date of Encounter: 12/27/2019  Primary Cardiologist: Donato Heinz, MD  Subjective   Sleepy but easily arousable. Denies any CP, edema, shortness of breath, feeling better.  Inpatient Medications    Scheduled Meds: . apixaban  5 mg Oral BID  . carvedilol  6.25 mg Oral BID WC  . chlordiazePOXIDE  25 mg Oral BID  . folic acid  1 mg Oral Daily  . influenza vac split quadrivalent PF  0.5 mL Intramuscular Tomorrow-1000  . LORazepam  2 mg Intravenous Once  . multivitamin with minerals  1 tablet Oral Daily  . sacubitril-valsartan  1 tablet Oral BID  . sodium chloride flush  3 mL Intravenous Q12H  . spironolactone  12.5 mg Oral Daily  . thiamine  100 mg Oral Daily   Or  . thiamine  100 mg Intravenous Daily   Continuous Infusions: . sodium chloride     PRN Meds: sodium chloride, alum & mag hydroxide-simeth, LORazepam **OR** LORazepam, ondansetron (ZOFRAN) IV, sodium chloride flush   Vital Signs    Vitals:   12/26/19 1900 12/26/19 2000 12/27/19 0000 12/27/19 0400  BP: 105/70  121/74 (!) 132/98  Pulse: 95 96 (!) 102   Resp: 16     Temp: 99 F (37.2 C)   97.6 F (36.4 C)  TempSrc: Oral   Oral  SpO2: 95%     Weight:      Height:        Intake/Output Summary (Last 24 hours) at 12/27/2019 0847 Last data filed at 12/27/2019 0300 Gross per 24 hour  Intake 438.13 ml  Output 1450 ml  Net -1011.87 ml   Last 3 Weights 12/26/2019 12/25/2019 12/24/2019  Weight (lbs) 128 lb 9.6 oz 135 lb 11.2 oz 135 lb  Weight (kg) 58.333 kg 61.553 kg 61.236 kg     Telemetry    Rate controlled atrial flutter - Personally Reviewed  Physical Exam   GEN: No acute distress, thin, cachectic appearing  HEENT: Normocephalic, atraumatic, sclera non-icteric. Neck: No JVD or bruits. Cardiac: Irregular rhythm, normal rate, + S3, no murmurs or rubs.  Radials/DP/PT 1+ and equal bilaterally.  Respiratory: Clear to auscultation bilaterally.  Breathing is unlabored. GI: Soft, nontender, non-distended, BS +x 4. MS: no deformity. Extremities: No clubbing or cyanosis. No edema. Distal pedal pulses are 2+ and equal bilaterally. Neuro:  AAOx3 - somewhat sleepy but easily arousable. Follows commands. Psych:  Flat affect.  Labs    High Sensitivity Troponin:   Recent Labs  Lab 12/24/19 1345 12/24/19 1604  TROPONINIHS 41* 41*      Cardiac EnzymesNo results for input(s): TROPONINI in the last 168 hours. No results for input(s): TROPIPOC in the last 168 hours.   Chemistry Recent Labs  Lab 12/25/19 0014 12/26/19 0257 12/27/19 0421  NA 133* 138 138  K 4.1 3.3* 4.4  CL 102 94* 98  CO2 20* 35* 31  GLUCOSE 92 146* 102*  BUN 24* 25* 27*  CREATININE 1.52* 1.42* 1.48*  CALCIUM 8.5* 8.8* 9.0  PROT 6.7 5.8* 6.0*  ALBUMIN 3.3* 2.8* 2.8*  AST 383* 185* 105*  ALT 283* 198* 155*  ALKPHOS 117 88 82  BILITOT 1.7* 1.0 0.9  GFRNONAA 49* 53* 51*  ANIONGAP 11 9 9      Hematology Recent Labs  Lab 12/25/19 0014 12/26/19 0636 12/27/19 0421  WBC 7.8 6.9 6.9  RBC 3.69* 4.17* 3.96*  HGB 11.9* 12.8* 12.1*  HCT 34.6* 38.3* 37.9*  MCV 93.8 91.8 95.7  MCH 32.2 30.7 30.6  MCHC 34.4 33.4 31.9  RDW 14.7 14.3 15.0  PLT 152 149* 148*    BNP Recent Labs  Lab 12/24/19 1345  BNP 2,812.2*     DDimer No results for input(s): DDIMER in the last 168 hours.   Radiology    ECHOCARDIOGRAM COMPLETE  Result Date: 12/25/2019    ECHOCARDIOGRAM REPORT   Patient Name:   Austin Cobb Date of Exam: 12/25/2019 Medical Rec #:  [id number]         Height:       70.0 in Accession #:    [id number]        Weight:       135.7 lb Date of Birth:  04/15/1960          BSA:          1.770 m Patient Age:    37 years          BP:           155/73 mmHg Patient Gender: M                 HR:           110 bpm. Exam Location:  Inpatient Procedure: 2D Echo, Cardiac Doppler and Color Doppler Indications:    CHF-Acute systolic  History:        Patient has prior  history of Echocardiogram examinations, most                 recent 05/20/2019. CAD, Arrythmias:Atrial Fibrillation;                 Signs/Symptoms:Shortness of Breath. Polysubstance abuse. GERD.                 DOE.  Sonographer:    Clayton Lefort RDCS (AE) Referring Phys: [place]  1. Left ventricular ejection fraction, by estimation, is 25 to 30%. The left ventricle has severely decreased function. The left ventricle demonstrates global hypokinesis. There is moderate left ventricular hypertrophy. Left ventricular diastolic function could not be evaluated. There is septal dyssynchrony.  2. Right ventricular systolic function is mildly reduced. The right ventricular size is normal. Tricuspid regurgitation signal is inadequate for assessing PA pressure.  3. The mitral valve is grossly normal. Trivial mitral valve regurgitation.  4. The aortic valve is tricuspid. Aortic valve regurgitation is not visualized. Aortic valve mean gradient measures 2.0 mmHg.  5. The inferior vena cava is normal in size with greater than 50% respiratory variability, suggesting right atrial pressure of 3 mmHg. Comparison(s): No significant change from prior study. 05/20/2019: LVEF 25-30%, global hypokinesis. FINDINGS  Left Ventricle: Left ventricular ejection fraction, by estimation, is 25 to 30%. The left ventricle has severely decreased function. The left ventricle demonstrates global hypokinesis. The left ventricular internal cavity size was normal in size. There is moderate left ventricular hypertrophy. Septal dyssynchrony. Left ventricular diastolic function could not be evaluated due to atrial fibrillation. Left ventricular diastolic function could not be evaluated. Right Ventricle: The right ventricular size is normal. No increase in right ventricular wall thickness. Right ventricular systolic function is mildly reduced. Tricuspid regurgitation signal is inadequate for assessing PA pressure. Left Atrium: Left atrial  size was normal in size. Right Atrium: Right atrial size was normal in size. Pericardium: There is no evidence of pericardial effusion. Mitral Valve: The mitral valve is grossly normal. Trivial mitral valve regurgitation. Tricuspid Valve: The tricuspid valve is grossly  normal. Tricuspid valve regurgitation is trivial. Aortic Valve: The aortic valve is tricuspid. Aortic valve regurgitation is not visualized. Aortic valve mean gradient measures 2.0 mmHg. Aortic valve peak gradient measures 3.0 mmHg. Aortic valve area, by VTI measures 3.01 cm. Pulmonic Valve: The pulmonic valve was normal in structure. Pulmonic valve regurgitation is not visualized. Aorta: The aortic root and ascending aorta are structurally normal, with no evidence of dilitation. Venous: The inferior vena cava is normal in size with greater than 50% respiratory variability, suggesting right atrial pressure of 3 mmHg. IAS/Shunts: The interatrial septum was not assessed.  LEFT VENTRICLE PLAX 2D LVIDd:         4.50 cm LVIDs:         3.80 cm LV PW:         1.60 cm LV IVS:        1.40 cm LVOT diam:     2.10 cm LV SV:         33 LV SV Index:   19 LVOT Area:     3.46 cm  RIGHT VENTRICLE            IVC RV Basal diam:  3.10 cm    IVC diam: 1.30 cm RV S prime:     6.38 cm/s TAPSE (M-mode): 1.1 cm LEFT ATRIUM           Index       RIGHT ATRIUM           Index LA diam:      3.60 cm 2.03 cm/m  RA Area:     15.60 cm LA Vol (A2C): 45.2 ml 25.54 ml/m RA Volume:   36.00 ml  20.34 ml/m LA Vol (A4C): 54.2 ml 30.62 ml/m  AORTIC VALVE AV Area (Vmax):    2.88 cm AV Area (Vmean):   2.84 cm AV Area (VTI):     3.01 cm AV Vmax:           87.10 cm/s AV Vmean:          62.825 cm/s AV VTI:            0.110 m AV Peak Grad:      3.0 mmHg AV Mean Grad:      2.0 mmHg LVOT Vmax:         72.45 cm/s LVOT Vmean:        51.550 cm/s LVOT VTI:          0.096 m LVOT/AV VTI ratio: 0.87  AORTA Ao Root diam: 3.70 cm Ao Asc diam:  3.40 cm  SHUNTS Systemic VTI:  0.10 m Systemic Diam:  2.10 cm Lyman Bishop MD Electronically signed by Lyman Bishop MD Signature Date/Time: 12/25/2019/5:53:38 PM    Final     Cardiac Studies   2D echo 12/25/19 1. Left ventricular ejection fraction, by estimation, is 25 to 30%. The  left ventricle has severely decreased function. The left ventricle  demonstrates global hypokinesis. There is moderate left ventricular  hypertrophy. Left ventricular diastolic  function could not be evaluated. There is septal dyssynchrony.  2. Right ventricular systolic function is mildly reduced. The right  ventricular size is normal. Tricuspid regurgitation signal is inadequate  for assessing PA pressure.  3. The mitral valve is grossly normal. Trivial mitral valve  regurgitation.  4. The aortic valve is tricuspid. Aortic valve regurgitation is not  visualized. Aortic valve mean gradient measures 2.0 mmHg.  5. The inferior vena cava is normal in size with greater than  50%  respiratory variability, suggesting right atrial pressure of 3 mmHg.   Comparison(s): No significant change from prior study. 05/20/2019: LVEF  25-30%, global hypokinesis.   Patient Profile     59 y.o. male with known nonischemic cardiomyopathy felt secondary to polysubstance abuse (EtOH abuse and cocaine), nonobstructive CAD on coronary angiogram 05/18/2019, known biventricular heart failure, anxiety, bipolar 1 disorder, depression, arthritis, paroxysmal atrial flutter (s/p prior TEE/DCCVs), likely CKD stage III, noncompliance. Followed by CHF team last admission 05/2019 but did not follow up. Readmitted 12/24/2019 with SOB, orthopnea, PND, abdominal swelling in the context of recurrent substance abuse and medication noncompliance. Found to have recurrent atrial flutter and CHF. Required CIWA protocol this admission.  Assessment & Plan    1. Acute on chronic combined CHF/biventricular heart failure/NICM - s/p diuresis earlier this admission with clinical improvement - 2.8L, not yet  weighed today - appears euvolemic off Lasix - continue carvedilol (strict instructions to avoid cocaine), Entresto, spironolactone - needs close OP follow-up particularly in primary care facet as well to help address substance abuse issues  2. Persistent atrial flutter - Eliquis restarted this admission - not a good candidate for revisiting cardioversion given noncompliance as outpatient - recommend rate control with close outpatient follow-up - previous notes raised question of ablation, will review with MD  3. Mild CAD by cath 05/2019 (40% prox RCA) -hsTroponin 41->41, reflective of demand ischemia rather than ACS  4. Transaminitis - suspect related to combination of CHF/ETOH - also likely reflects underlying liver disease given elevated baseline INR of 1.8 - RUQ [place] with dilated common bile duct and irregular contour of liver consistent with history of cirrhosis, further management of CBD per IM - avoid statin  5. Mild anemia/thrombocytopenia - continue to trend, relatively stable during admission  6. Polysubstance abuse - remains crux of issues - poor prognosis with relapse  7. CKD stage III - Cr 1.51 in 08/2019, relatively stable this admission (1.48 today)  Cardiology f/u tentatively arranged Monday Jan 03, 2020 8:20 AM. Also filled out [place] paperwork for assistance as well.   For questions or updates, please contact [place] Please consult [email or web address] for contact info under Cardiology/STEMI.  Signed, Charlie Pitter, PA-C 12/27/2019, 8:47 AM   Pt seen and examined  I agree with findings as noted above by D Dunn Pt with systolic CHF   Has diuresed this admit ON exam, Pt is mildly confused   Restless Neck:  JVP is normal  Lungs are CTA  Cardiac Irreg irreg  No S3    Abd is supple  Ext are without edema  Feet warm  Keep on current regimen   WIll continue to follow rate control for afib/fultter on telemetry Trivial elevaton of troponin most likely demand in  setting of known CHF   I am not convinced of angina  Dorris Carnes MD

## 2019-12-27 NOTE — Evaluation (Signed)
Physical Therapy Evaluation Patient Details Name: Austin Cobb MRN: [id number] DOB: [birth year] Today's Date: 12/27/2019   History of Present Illness  Pt is a 59 y/o male admitted secondary to CHF exacerbation and a flutter. PMH includes tobacco use, polysubstance abuse, alcohol abuse, and CKD.   Clinical Impression  Pt admitted secondary to problem above with deficits below. Pt requiring min guard A for mobility tasks within the room using RW. Did note some memory deficits and slowed processing. Per pt lives with a roommate who can provide 24/7 assist; will need to ensure this is accurate. If pt's roommate able to provide 24/7 supervision, feel pt would benefit from [place]. Will continue to follow acutely to maximize functional mobility independence and safety.     Follow Up Recommendations Home health PT;Supervision/Assistance - 24 hour    Equipment Recommendations  None recommended by PT    Recommendations for Other Services       Precautions / Restrictions Precautions Precautions: Fall Restrictions Weight Bearing Restrictions: No      Mobility  Bed Mobility Overal bed mobility: Needs Assistance Bed Mobility: Supine to Sit     Supine to sit: Supervision     General bed mobility comments: Supervision for safety. Sitting at EOB at end of session   Transfers Overall transfer level: Needs assistance Equipment used: Rolling walker (2 wheeled) Transfers: Sit to/from Stand Sit to Stand: Min guard         General transfer comment: Min guard for safety. Cues for safe hand placement.   Ambulation/Gait Ambulation/Gait assistance: Min guard Gait Distance (Feet): 25 Feet Assistive device: Rolling walker (2 wheeled) Gait Pattern/deviations: Step-through pattern;Decreased stride length Gait velocity: Decreased   General Gait Details: Pt only agreeable to ambulating within the room. Required min guard A for mobility using RW. No overt LOB noted. Did not note SOB during  short distance ambulation.   Stairs            Wheelchair Mobility    Modified Rankin (Stroke Patients Only)       Balance Overall balance assessment: Needs assistance Sitting-balance support: No upper extremity supported Sitting balance-Leahy Scale: Good     Standing balance support: Bilateral upper extremity supported Standing balance-Leahy Scale: Poor Standing balance comment: Reliant on UE support                              Pertinent Vitals/Pain Pain Assessment: No/denies pain    Home Living Family/patient expects to be discharged to:: Private residence Living Arrangements: Non-relatives/Friends (roommate and her child) Available Help at Discharge: Friend(s);Available 24 hours/day Type of Home: House Home Access: Stairs to enter Entrance Stairs-Rails: Left;Right;Can reach both Entrance Stairs-Number of Steps: 5 Home Layout: One level Home Equipment: Walker - 2 wheels;Cane - single point;Grab bars - tub/shower;Shower seat      Prior Function Level of Independence: Needs assistance   Gait / Transfers Assistance Needed: Reports he has been using RW. Reports his roommate will help him walk if needed.   ADL's / Homemaking Assistance Needed: Reports roommate assists with driving and cooking.         Hand Dominance        Extremity/Trunk Assessment   Upper Extremity Assessment Upper Extremity Assessment: Defer to OT evaluation    Lower Extremity Assessment Lower Extremity Assessment: Generalized weakness    Cervical / Trunk Assessment Cervical / Trunk Assessment: Normal  Communication   Communication: No difficulties  Cognition Arousal/Alertness:  Awake/alert Behavior During Therapy: Flat affect Overall Cognitive Status: No family/caregiver present to determine baseline cognitive functioning                                 General Comments: Pt reporting initially that he does not use any AD, however, at end of session  reports he uses RW. Some memory deficits noted and also demonstrated slowed processing.       General Comments      Exercises     Assessment/Plan    PT Assessment Patient needs continued PT services  PT Problem List Decreased strength;Decreased balance;Decreased mobility;Decreased activity tolerance;Decreased cognition;Decreased safety awareness       PT Treatment Interventions DME instruction;Gait training;Functional mobility training;Balance training;Therapeutic exercise;Therapeutic activities;Stair training;Patient/family education    PT Goals (Current goals can be found in the Care Plan section)  Acute Rehab PT Goals Patient Stated Goal: "to eat my snacks"  PT Goal Formulation: With patient Time For Goal Achievement: 01/10/20 Potential to Achieve Goals: Good    Frequency Min 3X/week   Barriers to discharge        Co-evaluation               AM-PAC PT "6 Clicks" Mobility  Outcome Measure Help needed turning from your back to your side while in a flat bed without using bedrails?: None Help needed moving from lying on your back to sitting on the side of a flat bed without using bedrails?: None Help needed moving to and from a bed to a chair (including a wheelchair)?: A Little Help needed standing up from a chair using your arms (e.g., wheelchair or bedside chair)?: A Little Help needed to walk in hospital room?: A Little Help needed climbing 3-5 steps with a railing? : A Lot 6 Click Score: 19    End of Session Equipment Utilized During Treatment: Gait belt Activity Tolerance: Patient tolerated treatment well Patient left: in bed;with call bell/phone within reach;with bed alarm set (sitting EOB ) Nurse Communication: Mobility status PT Visit Diagnosis: Unsteadiness on feet (R26.81);Muscle weakness (generalized) (M62.81)    Time: 3545-6256 PT Time Calculation (min) (ACUTE ONLY): 15 min   Charges:   PT Evaluation $PT Eval Low Complexity: 1 Low           Lou Miner, DPT  Acute Rehabilitation Services  Pager: [phone] Office: [phone]   Rudean Hitt 12/27/2019, 9:48 AM

## 2019-12-27 NOTE — TOC Initial Note (Signed)
Transition of Care [facility]) - Initial/Assessment Note    Patient Details  Name: Austin Cobb MRN: [id number] Date of Birth: Apr 16, 1960  [facility] CM/SW Contact:    Zenon Mayo, RN Phone Number: 12/27/2019, 4:36 PM  Clinical Narrative:                 NCM spoke with patient, gave SA resources and offered choice for HHPT, patient states he has no problems with transportation and he lives with his aide.  TOC will continue to follow for dc needs.  Expected Discharge Plan: [place] Barriers to Discharge: Continued Medical Work up   Patient Goals and CMS Choice Patient states their goals for this hospitalization and ongoing recovery are:: get better CMS Medicare.gov Compare Post Acute Care list provided to:: Patient Choice offered to / list presented to : Patient  Expected Discharge Plan and Services Expected Discharge Plan: [place]   Discharge Planning Services: CM Consult Post Acute Care Choice: NA Living arrangements for the past 2 months: Single Family Home                   DME Agency: NA       HH Arranged: Refused HH          Prior Living Arrangements/Services Living arrangements for the past 2 months: Single Family Home Lives with:: Roommate Patient language and need for interpreter reviewed:: Yes Do you feel safe going back to the place where you live?: Yes      Need for Family Participation in Patient Care: Yes (Comment) Care giver support system in place?: Yes (comment)   Criminal Activity/Legal Involvement Pertinent to Current Situation/Hospitalization: No - Comment as needed  Activities of Daily Living      Permission Sought/Granted                  Emotional Assessment Appearance:: Appears stated age Attitude/Demeanor/Rapport: Avoidant Affect (typically observed): Blunt Orientation: : Oriented to Self, Oriented to Place, Oriented to  Time, Oriented to Situation Alcohol /  Substance Use: Tobacco Use, Alcohol Use Psych Involvement: No (comment)  Admission diagnosis:  Cirrhosis ([place]) [K74.60] Acute on chronic heart failure (HCC) [I50.9] Congestive heart failure, unspecified HF chronicity, unspecified heart failure type [facility]) [I50.9] Patient Active Problem List   Diagnosis Date Noted  . Acute on chronic heart failure ([place]) 12/24/2019  . Heparin induced thrombocytopenia ([place]) 05/25/2019  . Protein-calorie malnutrition, severe 05/20/2019  . Yeast UTI 05/15/2019  . Alcohol withdrawal ([place]) 05/14/2019  . Acute metabolic encephalopathy 29/92/4268  . AKI (acute kidney injury) ([place]) 05/14/2019  . Elevated d-dimer 05/14/2019  . Demand ischemia ([place]) 05/14/2019  . Elevated troponin 05/14/2019  . Pleural effusion 05/14/2019  . Polysubstance abuse ([place]) 05/14/2019  . E. coli UTI 05/14/2019  . Agitation 05/14/2019  . Noncompliance 05/14/2019  . Acute on chronic combined systolic and diastolic CHF (congestive heart failure) ([place])   . Atrial flutter with rapid ventricular response ([place]) 05/04/2019  . Acute heart failure ([place])   . Essential hypertension   . Herpes zoster without complication 34/19/6222  . History of fall 07/23/2017  . Prostatitis 06/23/2014  . Serrated adenoma of colon 05/13/2012  . Anal fissure 05/13/2012  . Tobacco abuse 05/13/2012  . 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 Chronic shoulder pain 01/03/2012  . Neck pain, chronic 01/03/2012  . Mood disorder ([place]) 01/03/2012  . Depression with anxiety 01/03/2012   PCP:  Patient, No Pcp  Per Pharmacy:   [facility], [place] [address] [place] [address] Phone: [phone] Fax: [phone]  [facility] [phone] [place], [place] [address] Phone: [phone] Fax: [phone]  [facility] [phone] - [place], [place] - [address]  [place] [address] Phone: [phone] Fax: [phone]     Social Determinants of Health (SDOH) Interventions    Readmission Risk Interventions Readmission Risk Prevention Plan 12/27/2019  Transportation Screening Complete  PCP or Specialist Appt within 3-5 Days Complete  HRI or Home Care Consult Complete  Social Work Consult for [place] Planning/Counseling Complete  Palliative Care Screening Not Applicable  Medication Review [employer]) Complete  Some recent data might be hidden

## 2019-12-27 NOTE — Progress Notes (Addendum)
Subjective:   No acute overnight events. Doing well on librium, with CIWA scores of 1 > 1 overnight. Did not require any ativan overnight.   Reports that he feels better today. No complaints at this time. All questions and concerns addressed.  PT evaluated, recommending HH PT with 24 hour supervision.  Objective:  Vital signs in last 24 hours: Vitals:   12/26/19 1900 12/26/19 2000 12/27/19 0000 12/27/19 0400  BP: 105/70  121/74 (!) 132/98  Pulse: 95 96 (!) 102   Resp: 16     Temp: 99 F (37.2 C)   97.6 F (36.4 C)  TempSrc: Oral   Oral  SpO2: 95%     Weight:      Height:       Physical Exam: General: Male, appears older than stated age, lying in bed, NAD. CV: irregular rhythm, normal rate. Pulm: diminished breath sounds at left lower base, but satting well on RA. Abdomen: soft, nondistended, nontender, +BS. Psych: cooperative on exam MSK: no edema  RUQ U/S 12/25/19: IMPRESSION: 1. The common bile duct is mildly dilated measuring 6.9 mm. 6 mm is typically considered the upper limits of normal. Recommend clinical correlation. If there is concern for obstruction, recommend MRCP or ERCP for better evaluation. 2. The gallbladder is normal in appearance. 3. Mildly irregular contour to the liver consistent with the reported history of cirrhosis. No focal masses.  Assessment/Plan:  Principal Problem:   Acute on chronic combined systolic and diastolic CHF (congestive heart failure) (HCC) Active Problems:   Tobacco abuse   Atrial flutter with rapid ventricular response (HCC)   Alcohol withdrawal (HCC)   Polysubstance abuse ([place])   Acute on chronic heart failure (HCC)  Austin Cobb is a 59 year old male with a past medical history of tobacco use disorder, alcohol use disorder, bipolar 1 disorder, GERD, atrial fibrillation,  and systolic heart failure (EF 25-30%) presents with dyspnea on exertion and fatigue over 1 month.   #Acute on chronic HFrEF  Echo on 10/16  consistent with previous TTE, EF 25-30% with global hypokinesis. Appreciate HF cardiology seeing patient. He is not a candidate for advance interventions at this time due to his non-compliance and recent cocaine use. Appears euvolemic on exam. -Continue Entresto, spironolactone  -Continue Coreg 6.25mg  BID -cardiology following, appreciate recs -Daily weights -strict I/O's -PT/OT recommending HH PT OT with 24 hour supervision  #Atrial Flutter w/ variable blockade Patient loaded with digoxin. Given patient is inconsistent with taking medication it was recommended to stop digoxin. Conitnues to be in atrial flutter with variable blockade on telemetry review, rate controlled - Greatly appreciate cardiology consult, see recommendations  - Continue Apixaban 5 mg BID - Coreg as above  #Alcohol use disorder On Librium and CIWA with Ativan PRN for score above 10. Modified normal protocol to prevent oversedation. Has not required ativan overnight. - Continue librium taper, will change to librium 25mg  daily - CIWA - Thiamine, Folate, MV - CMP, Mg, P  #Alcoholic hepatitis vs alcoholic cirrhosis RUQ [place] consistent with cirrhosis ,also showed dilated common bile duct (40mm is normall, patients 6.41mm). Liver enzymes continuing to trend down. MELD score 22. INR 1.4.  - Trend CMP and INR - will consider adding lactulose therapy if evidence of encephalopathy  #Coffee ground emesis Had an episode of coffee ground emesis on night of 12/25/19. -Hgb stable today, 12.8 > 12.1 -begin PPI -f/u outpatient GI for possible endoscopic evaluation  #Elevated Cr, AKI vs CKD 3a Likely prerenal azotemia. Baseline Cr  appears to be around 1.1, but was around 1.5 3 months ago. Stable since admission. - Cr 1.42 > 1.48, will continue to monitor UOP and renal indices  #Tobacco use disorder - Nicotine patch   #Pyuria, history of E. Coli UTI - UA showing large leuks, few bacteria, WBC 21-50 >> appears to be pyuria - no  indication for antibiotics at this time as patient is not complaining of abdominal pain, dysuria, urinary frequency   #Thrombocytopenia #Anemia - Hgb stable , platelets stable - Trend CBC   Prior to Admission Living Arrangement: Home Anticipated Discharge Location: TBD Barriers to Discharge: continued medical management Dispo: TBD   Virl Axe, MD 12/27/2019, 3:19 PM Pager: [phone] After 5pm on weekdays and 1pm on weekends: On Call pager [phone]

## 2019-12-27 NOTE — Progress Notes (Addendum)
Heart Failure Stewardship Pharmacist Progress Note   PCP: Patient, No Pcp Per PCP-Cardiologist: Donato Heinz, MD    HPI:  59 yo M with PMH of NICM, polysubstance abuse, nonobstructive CAD, anxiety, bipolar 1 disorder, depression, paroxysmal atrial flutter, CKD III and medication noncompliance. He presented to the ED on 12/24/19 with shortness of breath, orthopnea, PND, abdominal swelling in the context of recurrent substance abuse and medication noncompliance. Found to have recurrent atrial flutter and CHF. ECHO done on 12/25/19 and LVEF is 25-30%  Current HF Medications: Carvedilol 6.25 mg BID Entresto 24/26 mg BID Spironolactone 12.5 mg daily  Prior to admission HF Medications: Not taking medications  Pertinent Lab Values: . Serum creatinine 1.48, BUN 27, Potassium 4.4, Sodium 138, BNP 2812.2, Magnesium 1.9  Vital Signs: . Weight: 128 lbs (admission weight: 135 lbs) . Blood pressure: 110-130/90s  . Heart rate: 70-90s   Medication Assistance / Insurance Benefits Check: Does the patient have prescription insurance?  Yes Type of insurance plan: [employer]  Does the patient qualify for medication assistance through manufacturers or grants?   Pending . Eligible grants and/or patient assistance programs: pending . Medication assistance applications in progress: none . Medication assistance applications approved: none Approved medication assistance renewals will be completed by: Dr. Newman Nickels office  Outpatient Pharmacy:  Prior to admission outpatient pharmacy: [facility] Is the patient willing to use [place] at discharge? Yes Is the patient willing to transition their outpatient pharmacy to utilize a [facility] outpatient pharmacy?   Pending    Assessment: 1. Acute on chronic systolic CHF (EF 16-83%), due to NICM. NYHA class II/III symptoms. - Continue carvedilol 6.25 mg BID - Continue Entresto 24/26 mg BID - Continue spironolactone 12.5 mg  daily. Consider increasing to target dose of 25 mg daily. - Per cardiology note yesterday, hold off restarting digoxin given medication noncompliance   Plan: 1) Medication changes recommended at this time: - Increase spironolactone to 25 mg daily  2) Patient assistance - Entresto copay >$300 with insurance - Will discuss with patient about enrolling in patient assistance  3)  Education  - To be completed prior to discharge  Kerby Nora, PharmD, BCPS Heart Failure Cytogeneticist Phone [phone]

## 2019-12-28 ENCOUNTER — Inpatient Hospital Stay (HOSPITAL_COMMUNITY): Payer: Medicare PPO

## 2019-12-28 DIAGNOSIS — I5043 Acute on chronic combined systolic (congestive) and diastolic (congestive) heart failure: Secondary | ICD-10-CM | POA: Diagnosis not present

## 2019-12-28 LAB — COMPREHENSIVE METABOLIC PANEL
ALT: 116 U/L — ABNORMAL HIGH (ref 0–44)
AST: 73 U/L — ABNORMAL HIGH (ref 15–41)
Albumin: 2.8 g/dL — ABNORMAL LOW (ref 3.5–5.0)
Alkaline Phosphatase: 74 U/L (ref 38–126)
Anion gap: 10 (ref 5–15)
BUN: 21 mg/dL — ABNORMAL HIGH (ref 6–20)
CO2: 28 mmol/L (ref 22–32)
Calcium: 8.7 mg/dL — ABNORMAL LOW (ref 8.9–10.3)
Chloride: 101 mmol/L (ref 98–111)
Creatinine, Ser: 1.04 mg/dL (ref 0.61–1.24)
GFR, Estimated: >60 mL/min (ref >60)
Glucose, Bld: 74 mg/dL (ref 70–99)
Potassium: 4.6 mmol/L (ref 3.5–5.1)
Sodium: 139 mmol/L (ref 135–145)
Total Bilirubin: 0.8 mg/dL (ref 0.3–1.2)
Total Protein: 5.8 g/dL — ABNORMAL LOW (ref 6.5–8.1)

## 2019-12-28 LAB — CBC WITH DIFFERENTIAL/PLATELET
Abs Immature Granulocytes: 0.01 10*3/uL (ref 0.00–0.07)
Basophils Absolute: 0.1 10*3/uL (ref 0.0–0.1)
Basophils Relative: 2 %
Eosinophils Absolute: 0.4 10*3/uL (ref 0.0–0.5)
Eosinophils Relative: 7 %
HCT: 34.7 % — ABNORMAL LOW (ref 39.0–52.0)
Hemoglobin: 11.5 g/dL — ABNORMAL LOW (ref 13.0–17.0)
Immature Granulocytes: 0 %
Lymphocytes Relative: 26 %
Lymphs Abs: 1.5 10*3/uL (ref 0.7–4.0)
MCH: 31.5 pg (ref 26.0–34.0)
MCHC: 33.1 g/dL (ref 30.0–36.0)
MCV: 95.1 fL (ref 80.0–100.0)
Monocytes Absolute: 0.7 10*3/uL (ref 0.1–1.0)
Monocytes Relative: 11 %
Neutro Abs: 3.1 10*3/uL (ref 1.7–7.7)
Neutrophils Relative %: 54 %
Platelets: 154 10*3/uL (ref 150–400)
RBC: 3.65 MIL/uL — ABNORMAL LOW (ref 4.22–5.81)
RDW: 14.9 % (ref 11.5–15.5)
WBC: 5.7 10*3/uL (ref 4.0–10.5)
nRBC: 0 % (ref 0.0–0.2)

## 2019-12-28 LAB — URINALYSIS, ROUTINE W REFLEX MICROSCOPIC
Bilirubin Urine: NEGATIVE
Glucose, UA: NEGATIVE mg/dL
Hgb urine dipstick: NEGATIVE
Ketones, ur: NEGATIVE mg/dL
Nitrite: POSITIVE — AB
Protein, ur: NEGATIVE mg/dL
Specific Gravity, Urine: 1.016 (ref 1.005–1.030)
WBC, UA: >50 WBC/hpf — ABNORMAL HIGH (ref 0–5)
pH: 8 (ref 5.0–8.0)

## 2019-12-28 LAB — MAGNESIUM: Magnesium: 1.7 mg/dL (ref 1.7–2.4)

## 2019-12-28 LAB — PHOSPHORUS: Phosphorus: 3.6 mg/dL (ref 2.5–4.6)

## 2019-12-28 MED ORDER — FUROSEMIDE 20 MG PO TABS
20.0000 mg | ORAL_TABLET | Freq: Every day | ORAL | Status: DC
  Administered 2019-12-28 – 2019-12-30 (×3): 20 mg via ORAL
  Filled 2019-12-28 (×3): qty 1

## 2019-12-28 MED ORDER — CARVEDILOL 12.5 MG PO TABS
12.5000 mg | ORAL_TABLET | Freq: Two times a day (BID) | ORAL | Status: DC
  Administered 2019-12-28 – 2019-12-29 (×2): 12.5 mg via ORAL
  Filled 2019-12-28 (×2): qty 1

## 2019-12-28 MED ORDER — POLYETHYLENE GLYCOL 3350 17 G PO PACK
17.0000 g | PACK | Freq: Two times a day (BID) | ORAL | Status: DC
  Administered 2019-12-28 – 2019-12-30 (×4): 17 g via ORAL
  Filled 2019-12-28 (×4): qty 1

## 2019-12-28 MED ORDER — CARVEDILOL 6.25 MG PO TABS
6.2500 mg | ORAL_TABLET | Freq: Once | ORAL | Status: AC
  Administered 2019-12-28: 6.25 mg via ORAL
  Filled 2019-12-28: qty 1

## 2019-12-28 NOTE — Progress Notes (Signed)
Physical Therapy Treatment Patient Details Name: Austin Cobb MRN: [id number] DOB: 19-Nov-1960 Today's Date: 12/28/2019    History of Present Illness Pt is a 59 y/o male admitted secondary to CHF exacerbation and a flutter. PMH includes tobacco use, polysubstance abuse, alcohol abuse, and CKD.     PT Comments    Pt agitated upon arrival requesting to talk to MD regarding medical status, MD made aware; pt on BSC requiring HHA and min A to return safely to bed; pt impulsive with no safety awareness; pt sitting EOB performing minimal LE exercises while eating cake with pulse increase to 138, pt returned supine in bed with pulse decreasing to 99, RN notified; pt continues to demonstrate deficits in balance, strength, coordiantion, gait and safety and will benefit from skilled PT to address deficits to maximize independence with functional mobility prior to discharge     Follow Up Recommendations  Home health PT;Supervision/Assistance - 24 hour     Equipment Recommendations  None recommended by PT    Recommendations for Other Services       Precautions / Restrictions Precautions Precautions: Fall Precaution Comments: impulsive and decreased safety awareness Restrictions Weight Bearing Restrictions: No    Mobility  Bed Mobility Overal bed mobility: Needs Assistance Bed Mobility: Sit to Supine     Supine to sit: Supervision Sit to supine: Supervision   General bed mobility comments: supervision for safety and line management; pt impulsive requiring cueing for safety and line management  Transfers Overall transfer level: Needs assistance Equipment used: 1 person hand held assist Transfers: Sit to/from Stand Sit to Stand: Min assist         General transfer comment: min guard for safety. cues for safety. stood impulsively and with no regard/awareness of lines and leads.   Ambulation/Gait Ambulation/Gait assistance: Min Web designer (Feet): 6 Feet Assistive  device: 1 person hand held assist       General Gait Details: pt wtih impulsive movement with decreased awarness of lines or safety   Stairs             Wheelchair Mobility    Modified Rankin (Stroke Patients Only)       Balance Overall balance assessment: Needs assistance Sitting-balance support: No upper extremity supported Sitting balance-Leahy Scale: Good     Standing balance support: No upper extremity supported Standing balance-Leahy Scale: Fair Standing balance comment: intermittent single UE support walking around foot of bed. Pt adamantly declined rw.                            Cognition Arousal/Alertness: Awake/alert;Lethargic Behavior During Therapy: Agitated;Impulsive;Flat affect Overall Cognitive Status: No family/caregiver present to determine baseline cognitive functioning                                 General Comments: Decreased awareness of deficits and safety. Walking with no regard for lines or cues for line management. Irritated that he has not slept well since PTA and keeps getting interupted. "I don't need all this stuff (telemetry). Difficulty problem solving and sequencing(vs decreased awareness?).      Exercises General Exercises - Lower Extremity Long Arc Quad: AROM;Both;10 reps;Seated Hip Flexion/Marching: AROM;Both;10 reps;Seated    General Comments        Pertinent Vitals/Pain Pain Assessment: No/denies pain    Home Living Family/patient expects to be discharged to:: Private residence Living Arrangements: Non-relatives/Friends (  roommate and her child) Available Help at Discharge: Friend(s);Available 24 hours/day Type of Home: House Home Access: Stairs to enter Entrance Stairs-Rails: Left;Right;Can reach both Home Layout: One level Home Equipment: Environmental consultant - 2 wheels;Cane - single point;Grab bars - tub/shower;Shower seat Additional Comments: per PT eval note    Prior Function Level of Independence:  Needs assistance  Gait / Transfers Assistance Needed: Reports he has been using RW. Reports his roommate will help him walk if needed.  ADL's / Homemaking Assistance Needed: Reports roommate assists with driving and cooking.  Comments: per PT eval note   PT Goals (current goals can now be found in the care plan section) Acute Rehab PT Goals Patient Stated Goal: to talk to a doctor PT Goal Formulation: With patient Time For Goal Achievement: 01/10/20 Potential to Achieve Goals: Good Progress towards PT goals: Progressing toward goals    Frequency    Min 3X/week      PT Plan Current plan remains appropriate    Co-evaluation              AM-PAC PT "6 Clicks" Mobility   Outcome Measure  Help needed turning from your back to your side while in a flat bed without using bedrails?: None Help needed moving from lying on your back to sitting on the side of a flat bed without using bedrails?: None Help needed moving to and from a bed to a chair (including a wheelchair)?: A Little Help needed standing up from a chair using your arms (e.g., wheelchair or bedside chair)?: A Little Help needed to walk in hospital room?: A Little Help needed climbing 3-5 steps with a railing? : A Lot 6 Click Score: 19    End of Session Equipment Utilized During Treatment: Gait belt Activity Tolerance: Treatment limited secondary to medical complications (Comment) Patient left: in bed;with call bell/phone within reach;with bed alarm set Nurse Communication: Mobility status PT Visit Diagnosis: Unsteadiness on feet (R26.81);Muscle weakness (generalized) (M62.81)     Time: 2774-1287 PT Time Calculation (min) (ACUTE ONLY): 12 min  Charges:  $Therapeutic Activity: 8-22 mins                     Lyanne Co, DPT Acute Rehabilitation Services [phone]  [place] 12/28/2019, 2:27 PM

## 2019-12-28 NOTE — Evaluation (Signed)
Occupational Therapy Evaluation Patient Details Name: Austin Cobb MRN: [id number] DOB: 28-Apr-1960 Today's Date: 12/28/2019    History of Present Illness Pt is a 59 y/o male admitted secondary to CHF exacerbation and a flutter. PMH includes tobacco use, polysubstance abuse, alcohol abuse, and CKD.    Clinical Impression   Pt admitted with the above diagnoses and presents with below problem list. Pt will benefit from continued acute OT to address the below listed deficits and maximize independence with basic ADLs prior to d/c to venue below. PTA pt was independent with basic ADLs. Pt currently min guard with LB ADLs and functional transfers/mobility. Of note, pt's HR was 106 resting in supine at start and end of session, quickly up to 136 with minimal activity (walked around foot of bed). Pt with decreased safety awareness and impulsive while mobilizing, verbal and tactile cueing for line management.      Follow Up Recommendations  Home health OT;Supervision/Assistance - 24 hour    Equipment Recommendations  None recommended by OT    Recommendations for Other Services       Precautions / Restrictions Precautions Precautions: Fall Precaution Comments: impulsive and decreased safety awareness Restrictions Weight Bearing Restrictions: No      Mobility Bed Mobility Overal bed mobility: Needs Assistance Bed Mobility: Supine to Sit;Sit to Supine     Supine to sit: Supervision Sit to supine: Supervision   General bed mobility comments: supervision for safety and line management  Transfers Overall transfer level: Needs assistance Equipment used: Rolling walker (2 wheeled) Transfers: Sit to/from Stand Sit to Stand: Min guard         General transfer comment: min guard for safety. cues for safety. stood impulsively and with no regard/awareness of lines and leads.     Balance Overall balance assessment: Needs assistance Sitting-balance support: No upper extremity  supported Sitting balance-Leahy Scale: Good     Standing balance support: No upper extremity supported Standing balance-Leahy Scale: Fair Standing balance comment: intermittent single UE support walking around foot of bed. Pt adamantly declined rw.                           ADL either performed or assessed with clinical judgement   ADL Overall ADL's : Needs assistance/impaired Eating/Feeding: Set up;Sitting   Grooming: Set up;Sitting   Upper Body Bathing: Set up;Sitting   Lower Body Bathing: Min guard;Sit to/from stand;Cueing for safety   Upper Body Dressing : Set up;Sitting   Lower Body Dressing: Min guard;Sit to/from stand;Cueing for safety   Toilet Transfer: Min guard;Ambulation;Cueing for safety   Toileting- Clothing Manipulation and Hygiene: Min guard;Set up;Sitting/lateral lean;Sit to/from stand;Cueing for safety       Functional mobility during ADLs: Min guard General ADL Comments: Pt limited by decreased cognition/behaviors, fatigue, and decreased volition. Did walk around foot of bed then back, impulsively and voicing irritation throughout. HR noted to be up to 136 with minimal activity; 106 asleep in bed at start and end of session. Pt did appear fatigued at that point though attributes fatigue to his need to sleep. "If I could just sleep that would take care of it."      Vision         Perception     Praxis      Pertinent Vitals/Pain Pain Assessment: No/denies pain     Hand Dominance     Extremity/Trunk Assessment Upper Extremity Assessment Upper Extremity Assessment: Generalized weakness   Lower  Extremity Assessment Lower Extremity Assessment: Defer to PT evaluation   Cervical / Trunk Assessment Cervical / Trunk Assessment: Normal   Communication Communication Communication: No difficulties   Cognition Arousal/Alertness: Awake/alert;Lethargic Behavior During Therapy: Agitated;Impulsive;Flat affect Overall Cognitive Status: No  family/caregiver present to determine baseline cognitive functioning                                 General Comments: Decreased awareness of deficits and safety. Walking with no regard for lines or cues for line management. Irritated that he has not slept well since PTA and keeps getting interupted. "I don't need all this stuff (telemetry). Difficulty problem solving and sequencing(vs decreased awareness?).   General Comments       Exercises     Shoulder Instructions      Home Living Family/patient expects to be discharged to:: Private residence Living Arrangements: Non-relatives/Friends (roommate and her child) Available Help at Discharge: Friend(s);Available 24 hours/day Type of Home: House Home Access: Stairs to enter [employer] of Steps: 5 Entrance Stairs-Rails: Left;Right;Can reach both Home Layout: One level     Bathroom Shower/Tub: Teacher, early years/pre: Standard     Home Equipment: Environmental consultant - 2 wheels;Cane - single point;Grab bars - tub/shower;Shower seat   Additional Comments: per PT eval note      Prior Functioning/Environment Level of Independence: Needs assistance  Gait / Transfers Assistance Needed: Reports he has been using RW. Reports his roommate will help him walk if needed.  ADL's / Homemaking Assistance Needed: Reports roommate assists with driving and cooking.    Comments: per PT eval note        OT Problem List: Decreased activity tolerance;Impaired balance (sitting and/or standing);Decreased cognition;Decreased safety awareness;Decreased knowledge of use of DME or AE;Decreased knowledge of precautions;Cardiopulmonary status limiting activity      OT Treatment/Interventions: Self-care/ADL training;Energy conservation;DME and/or AE instruction;Therapeutic exercise;Therapeutic activities;Cognitive remediation/compensation;Patient/family education;Balance training    OT Goals(Current goals can be found in the care  plan section) Acute Rehab OT Goals Patient Stated Goal: sleep OT Goal Formulation: With patient Time For Goal Achievement: 01/11/20 Potential to Achieve Goals: Good ADL Goals Pt Will Perform Grooming: Independently;standing Pt Will Perform Lower Body Dressing: Independently;sit to/from stand Pt Will Transfer to Toilet: Independently;ambulating Pt Will Perform Toileting - Clothing Manipulation and hygiene: Independently;sit to/from stand  OT Frequency: Min 2X/week   Barriers to D/C:            Co-evaluation              AM-PAC OT "6 Clicks" Daily Activity     Outcome Measure Help from another person eating meals?: None Help from another person taking care of personal grooming?: None Help from another person toileting, which includes using toliet, bedpan, or urinal?: A Little Help from another person bathing (including washing, rinsing, drying)?: A Little Help from another person to put on and taking off regular upper body clothing?: None Help from another person to put on and taking off regular lower body clothing?: A Little 6 Click Score: 21   End of Session    Activity Tolerance: Other (comment);Patient limited by fatigue (HR 106 at rest, 136 with minimal OOB activity) Patient left: in bed;with call bell/phone within reach;with bed alarm set  OT Visit Diagnosis: Unsteadiness on feet (R26.81);Muscle weakness (generalized) (M62.81);Other symptoms and signs involving cognitive function  Time: 1115-1130 OT Time Calculation (min): 15 min Charges:  OT General Charges $OT Visit: 1 Visit OT Evaluation $OT Eval Low Complexity: [place], OT Acute Rehabilitation Services Pager: [phone] Office: [phone]   Hortencia Pilar 12/28/2019, 12:00 PM

## 2019-12-28 NOTE — Progress Notes (Signed)
   12/28/19 1630  Assess: MEWS Score  Temp 98 F (36.7 C)  BP (!) 133/98  Pulse Rate (!) 111  Resp 18  Level of Consciousness Alert  SpO2 100 %  O2 Device Room Air  Assess: MEWS Score  MEWS Temp 0  MEWS Systolic 0  MEWS Pulse 2  MEWS RR 0  MEWS LOC 0  MEWS Score 2  MEWS Score Color Yellow  Assess: if the MEWS score is Yellow or Red  Were vital signs taken at a resting state? Yes  Focused Assessment No change from prior assessment  Early Detection of Sepsis Score *See Row Information* Low  MEWS guidelines implemented *See Row Information* No, previously yellow, continue vital signs every 4 hours   Patient with intermittent HR above 110. MD aware and medications already appropriately adjusted. Will continue to monitor

## 2019-12-28 NOTE — Progress Notes (Signed)
   12/28/19 0900  OT Visit Information  Last OT Received On 12/28/19  Reason Eval/Treat Not Completed Patient at procedure or test/ unavailable  History of Present Illness Pt is a 59 y/o male admitted secondary to CHF exacerbation and a flutter. PMH includes tobacco use, polysubstance abuse, alcohol abuse, and CKD.     Pt out of room for test/procedure. Plan to reattempt at a later time.  Tyrone Schimke, OT Acute Rehabilitation Services Pager: [phone] Office: [phone]

## 2019-12-28 NOTE — Progress Notes (Signed)
PT Cancellation Note  Patient Details Name: Austin Cobb MRN: [id number] DOB: [birth year]   Cancelled Treatment:     pt with OT will attempt later  Lyanne Co, DPT Acute Rehabilitation Services [phone]   [place] 12/28/2019, 12:54 PM

## 2019-12-28 NOTE — Progress Notes (Addendum)
Progress Note  Patient Name: Austin Cobb Date of Encounter: 12/28/2019  Primary Cardiologist: Donato Heinz, MD  Subjective   More alert this AM, states he doesn't really know how to feel, not happy with all the interruptions. HR increased this AM now that he is more awake. Denies any specific acute complaints.  Inpatient Medications    Scheduled Meds: . apixaban  5 mg Oral BID  . carvedilol  6.25 mg Oral BID WC  . chlordiazePOXIDE  25 mg Oral Daily  . folic acid  1 mg Oral Daily  . LORazepam  2 mg Intravenous Once  . multivitamin with minerals  1 tablet Oral Daily  . pantoprazole  40 mg Oral Daily  . sacubitril-valsartan  1 tablet Oral BID  . sodium chloride flush  3 mL Intravenous Q12H  . spironolactone  12.5 mg Oral Daily  . thiamine  100 mg Oral Daily   Or  . thiamine  100 mg Intravenous Daily   Continuous Infusions: . sodium chloride     PRN Meds: sodium chloride, alum & mag hydroxide-simeth, LORazepam **OR** LORazepam, ondansetron (ZOFRAN) IV, sodium chloride flush   Vital Signs    Vitals:   12/27/19 0400 12/27/19 1625 12/27/19 1958 12/28/19 0410  BP: (!) 132/98 (!) 149/99 (!) 146/95 (!) 138/106  Pulse:  97 (!) 110 (!) 111  Resp:  20 19 19   Temp: 97.6 F (36.4 C) 98.2 F (36.8 C) 98.7 F (37.1 C) 98.9 F (37.2 C)  TempSrc: Oral Oral    SpO2:  99%  100%  Weight:    61.6 kg  Height:        Intake/Output Summary (Last 24 hours) at 12/28/2019 0815 Last data filed at 12/28/2019 0415 Gross per 24 hour  Intake 1080 ml  Output 1901 ml  Net -821 ml   Last 3 Weights 12/28/2019 12/26/2019 12/25/2019  Weight (lbs) 135 lb 12.8 oz 128 lb 9.6 oz 135 lb 11.2 oz  Weight (kg) 61.598 kg 58.333 kg 61.553 kg     Telemetry    Atrial flutter both 3:1 and 2:1 conduction with HR 80s-120s - Personally Reviewed  Physical Exam   GEN: No acute distress, thin, cachectic appearing. HEENT: Normocephalic, atraumatic, sclera non-icteric. Neck: No JVD or  bruits. Cardiac: Irregular rhythm, no murmurs, rubs or gallops.  Radials/DP/PT 1+ and equal bilaterally.  Respiratory: Coarse BS at left base and moderately diminished BS at right base. Breathing is unlabored. GI: Soft, nontender, non-distended, BS +x 4. MS: no deformity. Extremities: No clubbing or cyanosis. No edema. Distal pedal pulses are 2+ and equal bilaterally. Neuro:  AAOx3. Follows commands. Psych:  Responds to questions appropriately with a normal affect.  Labs    High Sensitivity Troponin:   Recent Labs  Lab 12/24/19 1345 12/24/19 1604  TROPONINIHS 41* 41*      Cardiac EnzymesNo results for input(s): TROPONINI in the last 168 hours. No results for input(s): TROPIPOC in the last 168 hours.   Chemistry Recent Labs  Lab 12/25/19 0014 12/26/19 0257 12/27/19 0421  NA 133* 138 138  K 4.1 3.3* 4.4  CL 102 94* 98  CO2 20* 35* 31  GLUCOSE 92 146* 102*  BUN 24* 25* 27*  CREATININE 1.52* 1.42* 1.48*  CALCIUM 8.5* 8.8* 9.0  PROT 6.7 5.8* 6.0*  ALBUMIN 3.3* 2.8* 2.8*  AST 383* 185* 105*  ALT 283* 198* 155*  ALKPHOS 117 88 82  BILITOT 1.7* 1.0 0.9  GFRNONAA 49* 53* 51*  ANIONGAP  11 9 9      Hematology Recent Labs  Lab 12/25/19 0014 12/26/19 0636 12/27/19 0421  WBC 7.8 6.9 6.9  RBC 3.69* 4.17* 3.96*  HGB 11.9* 12.8* 12.1*  HCT 34.6* 38.3* 37.9*  MCV 93.8 91.8 95.7  MCH 32.2 30.7 30.6  MCHC 34.4 33.4 31.9  RDW 14.7 14.3 15.0  PLT 152 149* 148*    BNP Recent Labs  Lab 12/24/19 1345  BNP 2,812.2*     DDimer No results for input(s): DDIMER in the last 168 hours.   Radiology    No results found.  Cardiac Studies   2D echo 12/25/19 1. Left ventricular ejection fraction, by estimation, is 25 to 30%. The  left ventricle has severely decreased function. The left ventricle  demonstrates global hypokinesis. There is moderate left ventricular  hypertrophy. Left ventricular diastolic  function could not be evaluated. There is septal dyssynchrony.   2. Right ventricular systolic function is mildly reduced. The right  ventricular size is normal. Tricuspid regurgitation signal is inadequate  for assessing PA pressure.  3. The mitral valve is grossly normal. Trivial mitral valve  regurgitation.  4. The aortic valve is tricuspid. Aortic valve regurgitation is not  visualized. Aortic valve mean gradient measures 2.0 mmHg.  5. The inferior vena cava is normal in size with greater than 50%  respiratory variability, suggesting right atrial pressure of 3 mmHg.   Comparison(s): No significant change from prior study. 05/20/2019: LVEF  25-30%, global hypokinesis.   Patient Profile     59 y.o. male with known nonischemic cardiomyopathy felt secondary to polysubstance abuse (EtOH abuse and cocaine), nonobstructive CAD on coronary angiogram 05/18/2019, known biventricular heart failure, anxiety, bipolar 1 disorder, depression, arthritis, paroxysmal atrial flutter (s/p prior TEE/DCCVs), likely CKD stage III, noncompliance. Followed by CHF team last admission 05/2019 but did not follow up. Readmitted 12/24/2019 with SOB, orthopnea, PND, abdominal swelling in the context of recurrent substance abuse and medication noncompliance. Found to have recurrent atrial flutter and CHF. Required CIWA protocol this admission.  Assessment & Plan    1. Acute on chronic combined CHF/biventricular heart failure/NICM - s/p diuresis earlier this admission with clinical improvement, - 3.6L thus far - weights now trending back up so may need to resume diuretic - recommend 2V CXR this AM to assess pleural effusion, will review further recs with MD - continue carvedilol (strict instructions to avoid cocaine), Entresto, spironolactone - will also need close OP follow-up particularly in primary care facet as well to help address substance abuse issues  2. Persistent atrial flutter - Eliquis restarted this admission - not a good candidate for revisiting cardioversion given  noncompliance as outpatient - recommend rate control with close outpatient follow-up - not a good candidate for ablation at this time given noncompliance issues, can be revisited in the outpatient setting - HR presently around 110 - obtain CXR as above, will discuss med plan with MD (just got carvedilol a short while ago - may need to titrate versus consider transition to metoprolol instead)  3. Mild CAD by cath 05/2019 (40% prox RCA) -hsTroponin 41->41, reflective of demand ischemia rather than ACS  4. Transaminitis - suspect related to combination of CHF/ETOH - also likely reflects underlying liver disease given elevated baseline INR of 1.8 - RUQ [place] with dilated common bile duct and irregular contour of liver consistent with history of cirrhosis, further management of CBD per IM - low albumin may also contribute to volume retention - avoid statin due to this  5. Mild anemia/thrombocytopenia - continue to trend, relatively stable during admission - labs still pending this AM  6. Polysubstance abuse - remains crux of issues - poor prognosis with any further relapses  7. CKD stage III - Cr 1.51 in 08/2019, relatively stable this admission but labs still pending  Cardiology f/u tentatively arranged Monday Jan 03, 2020 8:20 AM - may need to move further out if his hospitalization is extended. Also filled out [place] paperwork for assistance as well.   For questions or updates, please contact [place] Please consult [email or web address] for contact info under Cardiology/STEMI.  Signed, Charlie Pitter, PA-C 12/28/2019, 8:15 AM    Patient seen and examined  I agree with findings as noted by D DUnn above   By the time I saw pt he was sleeping   Sleeping comfortably supine    Lungs   Mild rhonchi Cardiac Irreg irreg  No S3 Ext are without edema  Afib/flutter  HR is still not optimally controlled  Increase carvedilol  Chronic systolic CHF    Volume appears pretty good On aldactone  12.5  Will add 20 lasix    Follow clinically Unfortunately patient has been noncompliant in past  I fear that this will be the case again.  Closer to d/c  Dorris Carnes MD

## 2019-12-28 NOTE — Plan of Care (Signed)
°  Problem: Health Behavior/Discharge Planning: Goal: Ability to manage health-related needs will improve Outcome: Progressing   Problem: Clinical Measurements: Goal: Ability to maintain clinical measurements within normal limits will improve Outcome: Progressing Goal: Will remain free from infection Outcome: Progressing Goal: Diagnostic test results will improve Outcome: Progressing Goal: Respiratory complications will improve Outcome: Progressing Goal: Cardiovascular complication will be avoided Outcome: Progressing   Problem: Activity: Goal: Risk for activity intolerance will decrease Outcome: Progressing   Problem: Nutrition: Goal: Adequate nutrition will be maintained Outcome: Progressing   Problem: Coping: Goal: Level of anxiety will decrease Outcome: Progressing   Problem: Elimination: Goal: Will not experience complications related to bowel motility Outcome: Progressing Goal: Will not experience complications related to urinary retention Outcome: Progressing   Problem: Pain Managment: Goal: General experience of comfort will improve Outcome: Progressing   Problem: Safety: Goal: Ability to remain free from injury will improve Outcome: Progressing   Problem: Skin Integrity: Goal: Risk for impaired skin integrity will decrease Outcome: Progressing   Problem: Education: Goal: Ability to demonstrate management of disease process will improve Outcome: Progressing Goal: Ability to verbalize understanding of medication therapies will improve Outcome: Progressing Goal: Individualized Educational Video(s) Outcome: Progressing

## 2019-12-28 NOTE — Progress Notes (Signed)
ANTICOAGULATION CONSULT NOTE - Follow Up Consult  Pharmacy Consult for Eliquis Indication: atrial fibrillation  Allergies  Allergen Reactions   Adhesive [Tape] Other (See Comments)    Heat and EKG pads flare pre-existing eczema   Heparin Other (See Comments)    HIT ab +, SRA  negative    Patient Measurements: Height: 5\' 10"  (177.8 cm) Weight: 61.6 kg (135 lb 12.8 oz) IBW/kg (Calculated) : 73  Vital Signs: Temp: 98.3 F (36.8 C) (10/19 0800) Temp Source: Oral (10/19 0800) BP: 138/106 (10/19 0410) Pulse Rate: 111 (10/19 0410)  Labs: Recent Labs    12/26/19 0257 12/26/19 0636 12/26/19 0636 12/27/19 0421 12/27/19 0818 12/28/19 0724  HGB  --  12.8*   < > 12.1*  --  11.5*  HCT  --  38.3*  --  37.9*  --  34.7*  PLT  --  149*  --  148*  --  154  LABPROT  --   --   --   --  16.7*  --   INR  --   --   --   --  1.4*  --   CREATININE 1.42*  --   --  1.48*  --   --    < > = values in this interval not displayed.    Estimated Creatinine Clearance: 46.2 mL/min (A) (by C-G formula based on SCr of 1.48 mg/dL (H)).  Assessment:  Anticoag: New Eliquis 5mg  BID for nonvalvular afib. Hgb 12.1 ok. Plts WNL. - pt borderline for dose adjustment with SCr 1.48 and Wt 58 kg - watch  Goal of Therapy:  Therapeutic oral anticoagulation   Plan:  Eliquis 5mg  BID  Highly non-compliant patient. Watch Scr Patient assistance for Entresto (10/18) Case manager consult for Eliquis copay cost?   Christoffer Currier S. Alford Highland, PharmD, BCPS Clinical Staff Pharmacist [email or web address] Alford Highland, [employer] 12/28/2019,10:15 AM

## 2019-12-28 NOTE — Progress Notes (Addendum)
Subjective:   No acute overnight events.  Reports feeling fine today. Informed patient of our current plan to increase coreg dose in order to optimize rate control of A flutter.   Discussed that we are still treating his heart failure with his home medications of Entresto and spironolactone.   He is complaining of new "nasty sensation" when urinating. Will get urinalysis to assess for possible UTI. Had asymptomatic bacteriuria but was not complaining of any symptoms prior to today.  Objective:  Vital signs in last 24 hours: Vitals:   12/28/19 0800 12/28/19 1407 12/28/19 1425 12/28/19 1434  BP:    130/88  Pulse:  (!) 138 (!) 138 (!) 101  Resp:    16  Temp: 98.3 F (36.8 C)   98 F (36.7 C)  TempSrc: Oral   Oral  SpO2:    99%  Weight:      Height:       Physical Exam: General: Male, appears older than stated age, lying in bed, NAD. CV: tachycardic, irregular rhythm, no m/r/g Pulm: slightly diminished breath sounds at left lower base, but satting well on RA. Abdomen: soft, nondistended, nontender, +BS Neuro: AAOx3, no focal deficits. MSK: 0-1+ edema in bilateral LE  RUQ U/S 12/25/19: IMPRESSION: 1. The common bile duct is mildly dilated measuring 6.9 mm. 6 mm is typically considered the upper limits of normal. Recommend clinical correlation. If there is concern for obstruction, recommend MRCP or ERCP for better evaluation. 2. The gallbladder is normal in appearance. 3. Mildly irregular contour to the liver consistent with the reported history of cirrhosis. No focal masses.  Assessment/Plan:  Principal Problem:   Acute on chronic combined systolic and diastolic CHF (congestive heart failure) (HCC) Active Problems:   Tobacco abuse   Atrial flutter with rapid ventricular response (HCC)   Alcohol withdrawal (HCC)   Polysubstance abuse ([place])   Acute on chronic heart failure (HCC)  Neco Kling is a 59 year old male with a past medical history of tobacco use  disorder, alcohol use disorder, bipolar 1 disorder, GERD, atrial fibrillation,  and systolic heart failure (EF 25-30%) presents with dyspnea on exertion and fatigue over 1 month.   #Acute on chronic HFrEF  Echo on 10/16 consistent with previous TTE, EF 25-30% with global hypokinesis. Appreciate HF cardiology seeing patient. He is not a candidate for advance interventions at this time due to his non-compliance and recent cocaine use. Will increase his coreg today as patient's rate up to 110s overnight. Will give one dose of lasix 20mg  as per cards for gentle diuresis. -Continue Entresto, spironolactone  -Increase coreg 12.5mg  BID for more optimal rate control -cardiology following, appreciate recs -will give one dose of lasix 20mg  for gentle diuresis -Daily weights -strict I/O's -PT/OT recommending HH PT OT with 24 hour supervision  #Atrial Flutter w/ variable blockade Patient loaded with digoxin. Given patient is inconsistent with taking medication it was recommended to stop digoxin. Continues to be in atrial flutter with variable blockade on telemetry review, rate uncontrolled. Will increase coreg to 12.5mg  BID for optimal rate control. - Greatly appreciate cardiology consult, see recommendations  - Continue Apixaban 5 mg BID - Coreg as above  #Alcohol use disorder CIWA with Ativan PRN for score above 10. Modified normal protocol to prevent oversedation. Has not required ativan overnight. - stop librium today - CIWA - Thiamine, Folate, MV - CMP, Mg, P  #Alcoholic hepatitis vs alcoholic cirrhosis RUQ [place] consistent with cirrhosis ,also showed dilated common bile duct (46mm  is normall, patients 6.70mm). Liver enzymes continuing to trend down.  - Trend CMP - lactulose not indicated at this time.  #Coffee ground emesis Had an episode of coffee ground emesis on night of 12/25/19. -Hgb 12.8 > 12.1 >11.5 -continue PPI -f/u outpatient GI for possible endoscopic evaluation  #Pyuria, history  of E. Coli UTI - UA showing large leuks, few bacteria, WBC 21-50 >> appears to be pyuria - patient was asymptomatic since admission, however on 10/19 reporting that he feels a "nasty sensation" when urinating - will obtain a urinalysis to reevaluate  #Elevated Cr, AKI vs CKD 3a - resolved Likely prerenal azotemia. Baseline Cr appears to be around 1.1, but was around 1.5 3 months ago. Stable since admission. - Cr 1.48 > 1.04, continue to monitor BMP  #Tobacco use disorder - Nicotine patch   #Thrombocytopenia #Anemia - Hgb stable , platelets stable - Trend CBC  Prior to Admission Living Arrangement: Home Anticipated Discharge Location: TBD Barriers to Discharge: continued medical management Dispo: TBD   Virl Axe, MD 12/28/2019, 3:16 PM Pager: [phone] After 5pm on weekdays and 1pm on weekends: On Call pager [phone]

## 2019-12-28 NOTE — Progress Notes (Signed)
Heart Failure Stewardship Pharmacist Progress Note   PCP: Patient, No Pcp Per PCP-Cardiologist: Donato Heinz, MD    HPI:  59 yo M with PMH of NICM, polysubstance abuse, nonobstructive CAD, anxiety, bipolar 1 disorder, depression, paroxysmal atrial flutter, CKD III and medication noncompliance. He presented to the ED on 12/24/19 with shortness of breath, orthopnea, PND, abdominal swelling in the context of recurrent substance abuse and medication noncompliance. Found to have recurrent atrial flutter and CHF. ECHO done on 12/25/19 and LVEF is 25-30%  Current HF Medications: Carvedilol 6.25 mg BID Entresto 24/26 mg BID Spironolactone 12.5 mg daily  Prior to admission HF Medications: Not taking medications  Pertinent Lab Values: . Serum creatinine 1.04, BUN 21, Potassium 4.6, Sodium 139, BNP 2812.2, Magnesium 1.7  Vital Signs: . Weight: 135 lbs (admission weight: 135 lbs) . Blood pressure: 130-160/100s  . Heart rate: 70-100s   Medication Assistance / Insurance Benefits Check: Does the patient have prescription insurance?  Yes Type of insurance plan: [employer] Medicare  Does the patient qualify for medication assistance through manufacturers or grants?   Yes . Eligible grants and/or patient assistance programs: Entresto . Medication assistance applications in progress: Entresto . Medication assistance applications approved: none Approved medication assistance renewals will be completed by: Dr. Newman Nickels office  Outpatient Pharmacy:  Prior to admission outpatient pharmacy: [facility] Is the patient willing to use [facility] at discharge? Yes Is the patient willing to transition their outpatient pharmacy to utilize a [facility] outpatient pharmacy?   Pending    Assessment: 1. Acute on chronic systolic CHF (EF 78-58%), due to NICM. NYHA class II/III symptoms. - Continue carvedilol 6.25 mg BID. Could increase to 12.5 mg BID for tachycardia (BP also  elevated) - Continue Entresto 24/26 mg BID - Continue spironolactone 12.5 mg daily. Consider increasing to target dose of 25 mg daily. Caution with rising K of 4.6 - Per cardiology note yesterday, hold off restarting digoxin given medication noncompliance   Plan: 1) Medication changes recommended at this time: - Continue current therapy. Consider increasing carvedilol (last dose increase was 10/17)  2) Patient assistance - Entresto copay >$300 with insurance - Patient assistance application completed and faxed to [employer]. Pending approval.  3)  Education  - To be completed prior to discharge  Kerby Nora, PharmD, BCPS Heart Failure Stewardship Pharmacist Phone [phone]

## 2019-12-29 ENCOUNTER — Telehealth: Payer: Self-pay

## 2019-12-29 DIAGNOSIS — I5043 Acute on chronic combined systolic (congestive) and diastolic (congestive) heart failure: Secondary | ICD-10-CM | POA: Diagnosis not present

## 2019-12-29 LAB — COMPREHENSIVE METABOLIC PANEL
ALT: 100 U/L — ABNORMAL HIGH (ref 0–44)
AST: 59 U/L — ABNORMAL HIGH (ref 15–41)
Albumin: 3 g/dL — ABNORMAL LOW (ref 3.5–5.0)
Alkaline Phosphatase: 73 U/L (ref 38–126)
Anion gap: 8 (ref 5–15)
BUN: 21 mg/dL — ABNORMAL HIGH (ref 6–20)
CO2: 31 mmol/L (ref 22–32)
Calcium: 8.8 mg/dL — ABNORMAL LOW (ref 8.9–10.3)
Chloride: 98 mmol/L (ref 98–111)
Creatinine, Ser: 1.2 mg/dL (ref 0.61–1.24)
GFR, Estimated: >60 mL/min (ref >60)
Glucose, Bld: 129 mg/dL — ABNORMAL HIGH (ref 70–99)
Potassium: 4.2 mmol/L (ref 3.5–5.1)
Sodium: 137 mmol/L (ref 135–145)
Total Bilirubin: 0.2 mg/dL — ABNORMAL LOW (ref 0.3–1.2)
Total Protein: 6.2 g/dL — ABNORMAL LOW (ref 6.5–8.1)

## 2019-12-29 MED ORDER — CARVEDILOL 12.5 MG PO TABS
12.5000 mg | ORAL_TABLET | Freq: Once | ORAL | Status: AC
  Administered 2019-12-29: 12.5 mg via ORAL
  Filled 2019-12-29: qty 1

## 2019-12-29 MED ORDER — CEPHALEXIN 250 MG PO CAPS
500.0000 mg | ORAL_CAPSULE | Freq: Two times a day (BID) | ORAL | Status: DC
  Administered 2019-12-29 – 2019-12-30 (×4): 500 mg via ORAL
  Filled 2019-12-29 (×4): qty 2

## 2019-12-29 MED ORDER — CARVEDILOL 25 MG PO TABS
25.0000 mg | ORAL_TABLET | Freq: Two times a day (BID) | ORAL | Status: DC
  Administered 2019-12-29 – 2019-12-30 (×2): 25 mg via ORAL
  Filled 2019-12-29 (×2): qty 1

## 2019-12-29 NOTE — Plan of Care (Signed)

## 2019-12-29 NOTE — Progress Notes (Signed)
  Mobility Specialist Criteria Algorithm Info.  Mobility Team: [facility] elevated:Self regulated Activity: Ambulated to bathroom Range of motion: Active; All  Level of assistance: Standby assist, set-up cues, supervision of patient - no hands on Assistive device: Front wheel walker  Received pt lying supine in bed sleep. Pt was very agitated when woken, declining ambulation for unspecified reasons.   HR Dangle: 104 HR Supine: 94  12/29/2019 2:07 PM

## 2019-12-29 NOTE — Progress Notes (Addendum)
Progress Note  Patient Name: Austin Cobb Date of Encounter: 12/29/2019  Primary Cardiologist: Donato Heinz, MD  Subjective   Very sleepy this am. Rouses easily to touch. No CP or SOB Has not had sedating meds this am  Inpatient Medications    Scheduled Meds: . apixaban  5 mg Oral BID  . carvedilol  12.5 mg Oral Once  . carvedilol  25 mg Oral BID WC  . cephALEXin  500 mg Oral Q12H  . folic acid  1 mg Oral Daily  . furosemide  20 mg Oral Daily  . LORazepam  2 mg Intravenous Once  . multivitamin with minerals  1 tablet Oral Daily  . pantoprazole  40 mg Oral Daily  . polyethylene glycol  17 g Oral BID  . sacubitril-valsartan  1 tablet Oral BID  . sodium chloride flush  3 mL Intravenous Q12H  . spironolactone  12.5 mg Oral Daily  . thiamine  100 mg Oral Daily   Or  . thiamine  100 mg Intravenous Daily   Continuous Infusions: . sodium chloride     PRN Meds: sodium chloride, alum & mag hydroxide-simeth, LORazepam **OR** LORazepam, ondansetron (ZOFRAN) IV, sodium chloride flush   Vital Signs    Vitals:   12/29/19 0348 12/29/19 0350 12/29/19 0800 12/29/19 0819  BP: 125/84 (!) 116/94 (!) 153/126   Pulse: (!) 52 (!) 102 (!) 107   Resp: 17 17 17 18   Temp: 98.5 F (36.9 C) 98.5 F (36.9 C) 98.8 F (37.1 C) 98.8 F (37.1 C)  TempSrc: Oral  Oral Oral  SpO2: 100%  99%   Weight:      Height:        Intake/Output Summary (Last 24 hours) at 12/29/2019 0937 Last data filed at 12/29/2019 0857 Gross per 24 hour  Intake 840 ml  Output 401 ml  Net 439 ml   Last 3 Weights 12/29/2019 12/28/2019 12/26/2019  Weight (lbs) 136 lb 1.6 oz 135 lb 12.8 oz 128 lb 9.6 oz  Weight (kg) 61.735 kg 61.598 kg 58.333 kg     Telemetry    Atrial flutter w/ variable conduction, HR generally controlled - Personally Reviewed  Physical Exam   GEN: thin, frail male, No acute distress.  Lying comfortably flat, on his side. Neck: No JVD Cardiac: Irreg R&R, no murmur, no  rubs, or gallops.  Respiratory: diminished to auscultation bilaterally with rales in the bases. GI: Soft, nontender, non-distended  MS: No edema; No deformity. Neuro:  Nonfocal  Psych: sleepy affect   Labs    High Sensitivity Troponin:   Recent Labs  Lab 12/24/19 1345 12/24/19 1604  TROPONINIHS 41* 41*     Chemistry Recent Labs  Lab 12/27/19 0421 12/28/19 0724 12/29/19 0422  NA 138 139 137  K 4.4 4.6 4.2  CL 98 101 98  CO2 31 28 31   GLUCOSE 102* 74 129*  BUN 27* 21* 21*  CREATININE 1.48* 1.04 1.20  CALCIUM 9.0 8.7* 8.8*  PROT 6.0* 5.8* 6.2*  ALBUMIN 2.8* 2.8* 3.0*  AST 105* 73* 59*  ALT 155* 116* 100*  ALKPHOS 82 74 73  BILITOT 0.9 0.8 0.2*  GFRNONAA 51* >60 >60  ANIONGAP 9 10 8      Hematology Recent Labs  Lab 12/26/19 0636 12/27/19 0421 12/28/19 0724  WBC 6.9 6.9 5.7  RBC 4.17* 3.96* 3.65*  HGB 12.8* 12.1* 11.5*  HCT 38.3* 37.9* 34.7*  MCV 91.8 95.7 95.1  MCH 30.7 30.6 31.5  MCHC  33.4 31.9 33.1  RDW 14.3 15.0 14.9  PLT 149* 148* 154    BNP Recent Labs  Lab 12/24/19 1345  BNP 2,812.2*    Drugs of Abuse     Component Value Date/Time   LABOPIA NONE DETECTED 12/25/2019 0055   COCAINSCRNUR POSITIVE (A) 12/25/2019 0055   LABBENZ NONE DETECTED 12/25/2019 0055   AMPHETMU NONE DETECTED 12/25/2019 0055   THCU NONE DETECTED 12/25/2019 0055   LABBARB NONE DETECTED 12/25/2019 0055     DDimer No results for input(s): DDIMER in the last 168 hours.   Radiology    DG Chest 2 View  Result Date: 12/28/2019 CLINICAL DATA:  Atrial flutter EXAM: CHEST - 2 VIEW COMPARISON:  12/24/2019 FINDINGS: Stable mild cardiomegaly. Pulmonary vascularity within normal limits. No focal airspace consolidation. There may be a trace right pleural effusion. No pneumothorax. IMPRESSION: 1. Stable mild cardiomegaly. 2. Possible trace right pleural effusion. Electronically Signed   By: Davina Poke D.O.   On: 12/28/2019 09:16    Cardiac Studies   2D echo 12/25/19 1.  Left ventricular ejection fraction, by estimation, is 25 to 30%. The  left ventricle has severely decreased function. The left ventricle  demonstrates global hypokinesis. There is moderate left ventricular  hypertrophy. Left ventricular diastolic  function could not be evaluated. There is septal dyssynchrony.  2. Right ventricular systolic function is mildly reduced. The right  ventricular size is normal. Tricuspid regurgitation signal is inadequate  for assessing PA pressure.  3. The mitral valve is grossly normal. Trivial mitral valve  regurgitation.  4. The aortic valve is tricuspid. Aortic valve regurgitation is not  visualized. Aortic valve mean gradient measures 2.0 mmHg.  5. The inferior vena cava is normal in size with greater than 50%  respiratory variability, suggesting right atrial pressure of 3 mmHg.   Comparison(s): No significant change from prior study. 05/20/2019: LVEF  25-30%, global hypokinesis.   Patient Profile     59 y.o. male with known nonischemic cardiomyopathy felt secondary to polysubstance abuse (EtOH abuse and cocaine), nonobstructive CAD on coronary angiogram 05/18/2019, known biventricular heart failure, anxiety, bipolar 1 disorder, depression, arthritis, paroxysmal atrial flutter (s/p prior TEE/DCCVs), likely CKD stage III, noncompliance.   Followed by CHF team last admission 05/2019 but did not follow up.  Readmitted 12/24/2019 with SOB, orthopnea, PND, abdominal swelling in the context of recurrent substance abuse and medication noncompliance. Found to have recurrent atrial flutter and CHF. Required CIWA protocol this admission.  Assessment & Plan    1. Acute on chronic combined CHF/biventricular heart failure/NICM - wt in the 120s during March admit, now 130s, dry wt unclear - s/p diuresis w/ -2.9 L documented, but I/O are incomplete - wt down 7 lbs but has come back up - however, CXR w/out sig edema - Coreg uptitrated to 25 mg bid, UDS +cocaine this  admit - is also on Entresto 24-26 bid and aldactone 12.5 mg qd - got IV Lasix 10/15-16, none 10/17-18, Lasix 20 mg po qd started 10/19 - encouraged oupt compliance and cocaine avoidance - no sig volume overload on exam, he may have gained some weight from eating regular meals.  2. Persistent atrial flutter - Eliquis restarted this admit, since compliance is a problem, may need to consider Xarelto - no TEE/DCCV since compliance is poor - continue rate control w/ Coreg  3. Mild CAD by cath 05/2019 (40% prox RCA) - feel mild trop elevation 2nd demand ischemia  4. Transaminitis - LFTs improving since admit,  albumin w/ minor trend up as well - INR is improving - no statin - per IM  5. Mild anemia/thrombocytopenia - no bleeding issues - per IM  6. Polysubstance abuse - UDS +cocaine this admit  7. CKD stage III - improved since admit, per IM  8. UTI - pt w/ hematuria and disuria, UA abnl - ABX started per IM - Cx pending   Cardiology f/u tentatively arranged Monday Jan 03, 2020 8:20 AM - may need to move further out if his hospitalization is extended. Also filled out [place] paperwork for assistance as well.   For questions or updates, please contact [place] Please consult [email or web address] for contact info under Cardiology/STEMI.  Signed, Rosaria Ferries, PA-C 12/29/2019, 9:37 AM    Pt seen and examined  I agree with findings as noted above by R Barrett Pt denies CP  Breathing is OK He complains of blood in urine   On exam:    Lungs with rales at bases Cardiac exam:  Irreg rate /rhythm   Ext are without edema     Volume status overall is prettyg good   He is tolerating current regimen   Severe lv dysfunction with thrombus   Rate control afib  Continue current meds including Eliquis  Has some hematuria which he is most concerneda about   Needs UA  Will sign off.  Please call with questions.  Follow up has been arranged on 10.25.21  Dorris Carnes MD

## 2019-12-29 NOTE — Telephone Encounter (Signed)
**Note De-Identified  Obfuscation** Letter received from [employer] [facility] [place] stating that they have approved the pt for asst with his [name] for the remainder of this year. Pt [id number]  The letter states that they have notified the pt of this approval as well.

## 2019-12-29 NOTE — Progress Notes (Addendum)
Paged that patient had episode of urethral bleeding. He reported having similar with prior episodes of UTI. Is also having some discomfort with urination. Denies recent trauma. No penile pain. Patient refuses genital exam at this time.  Per RN, he had frank red blood per urethra, roughly 3ccs. Bleeding stopped soon afterwards. No other sources of bleeding, is on Eliquis. Per nurse, unremarkable exam besides mild hypospadis.  UA today with nitrites, large leuks, many bacteria. Culture pending. Last culture grew pan-sensitive E coli.   Plan: -start Keflex 500mg  BID for 5 days -f/u urine culture

## 2019-12-29 NOTE — Progress Notes (Signed)
Heart Failure Stewardship Pharmacist Progress Note   PCP: Patient, No Pcp Per PCP-Cardiologist: Donato Heinz, MD    HPI:  59 yo M with PMH of NICM, polysubstance abuse, nonobstructive CAD, anxiety, bipolar 1 disorder, depression, paroxysmal atrial flutter, CKD III and medication noncompliance. He presented to the ED on 12/24/19 with shortness of breath, orthopnea, PND, abdominal swelling in the context of recurrent substance abuse and medication noncompliance. Found to have recurrent atrial flutter and CHF. ECHO done on 12/25/19 and LVEF is 25-30%  Current HF Medications: Furosemide 20 mg daily Carvedilol 25 mg BID Entresto 24/26 mg BID Spironolactone 12.5 mg daily  Prior to admission HF Medications: Not taking medications  Pertinent Lab Values: . Serum creatinine 1.2, BUN 21, Potassium 4.2, Sodium 137, BNP 2812.2, Magnesium 1.7  Vital Signs: . Weight: 135 lbs (admission weight: 135 lbs) . Blood pressure: 110-150/70-100s  . Heart rate: 100s   Medication Assistance / Insurance Benefits Check: Does the patient have prescription insurance?  Yes Type of insurance plan: [employer] Medicare  Does the patient qualify for medication assistance through manufacturers or grants?   Yes . Eligible grants and/or patient assistance programs: Entresto . Medication assistance applications in progress: none . Medication assistance applications approved: Entresto Approved medication assistance renewals will be completed by: Dr. Newman Nickels office  Outpatient Pharmacy:  Prior to admission outpatient pharmacy: [facility] Is the patient willing to use [facility] at discharge? Yes Is the patient willing to transition their outpatient pharmacy to utilize a [facility] outpatient pharmacy?   Pending    Assessment: 1. Acute on chronic systolic CHF (EF 44-01%), due to NICM. NYHA class II/III symptoms. - Continue furosemide 20 mg daily - Agree with increasing carvedilol to 25 mg  BID - Continue Entresto 24/26 mg BID - Continue spironolactone 12.5 mg daily. Consider increasing to target dose of 25 mg daily. - Consider starting SGLT2i as outpatient. Currently being treated for UTI. - Per cardiology note yesterday, hold off restarting digoxin given medication noncompliance   Plan: 1) Medication changes recommended at this time: - Continue current therapy. Consider increasing spironolactone to 25 mg daily at discharge (prevents patient from having to split tablets)   2) Patient assistance - Entresto copay >$300 with insurance - Patient assistance application completed and faxed to [employer]. Application approved on 12/29/19.  3)  Education  - To be completed prior to discharge  Kerby Nora, PharmD, BCPS Heart Failure Stewardship Pharmacist Phone [phone]

## 2019-12-29 NOTE — Progress Notes (Signed)
Subjective:   Had an episode of urethral bleeding 3cc overnight. UA positive, started on 5-day course of Kephlex. Urine cx pending.  Reports feeling better aside from the blood in his urine, which he reports he had another of episode of this AM. Reports that he has his energy back after his heart rate is a little more controlled with medication.  Discussed our plan with him to increase his coreg to optimize rate control. Also informed him that he has been started on abx to cover his UTI. He agrees with plan. All questions and concerns were addressed.   Objective:  Vital signs in last 24 hours: Vitals:   12/28/19 2043 12/29/19 0000 12/29/19 0348 12/29/19 0350  BP: (!) 107/92 (!) 116/94 125/84 (!) 116/94  Pulse:  (!) 107 (!) 52 (!) 102  Resp: 15 17 17 17   Temp: 98.4 F (36.9 C) 98.4 F (36.9 C) 98.5 F (36.9 C) 98.5 F (36.9 C)  TempSrc: Oral Oral Oral   SpO2: 100% 98% 100%   Weight:  61.7 kg    Height:       Physical Exam: General: Male, appears older than stated age, lying in bed, NAD. CV: slightly tachypneic, irregular rhythm, no m/r/g Pulm: CTABL, no adventitious sounds noted Abdomen: soft, nondistended, nontender, +BS Neuro: AAOx3, no focal deficits MSK: no edema on exam today   Assessment/Plan:  Principal Problem:   Acute on chronic combined systolic and diastolic CHF (congestive heart failure) (HCC) Active Problems:   Tobacco abuse   Atrial flutter with rapid ventricular response (HCC)   Alcohol withdrawal (HCC)   Polysubstance abuse (HCC)   Acute on chronic heart failure (HCC)  Austin Cobb is a 59 year old male with a past medical history of tobacco use disorder, alcohol use disorder, bipolar 1 disorder, GERD, atrial fibrillation,  and systolic heart failure (EF 25-30%) presents with dyspnea on exertion and fatigue over 1 month.   #Acute on chronic HFrEF  Echo on 10/16 consistent with previous TTE, EF 25-30% with global hypokinesis. Appreciate HF  cardiology seeing patient. He is not a candidate for advance interventions at this time due to his non-compliance and recent cocaine use. Will increase his coreg again today as he is still not completely rate controlled at this time. Appears euvolemic on exam. Will give another dose of lasix 20mg  to maintain euvolemia. Weight today is 61.7kg, which appears to be his dry weight. Once adequate rate control is achieved, can consider discharge with outpatient f/u for HF and A-flutter. -Continue Entresto, spironolactone  -Will give coreg 12.5mg  in morning and give coreg 25mg  in PM -cardiology following, appreciate recs -will see if lasix 20mg  is adequate for his home diuretic dosage to maintain euvolemia -Daily weights -strict I/O's -PT/OT recommending HH PT OT with 24 hour supervision  #Atrial Flutter w/ variable blockade Patient loaded with digoxin. Given patient is inconsistent with taking medication it was recommended to stop digoxin. Continues to be in atrial flutter with variable blockade on telemetry review, rate getting closer to being controlled. HR 106-108 despite coreg 12.5mg  given last night. Will increase coreg to 25mg  to optimize rate control prior to discharge. BP is stable. - Greatly appreciate cardiology consult, see recommendations  - Continue Apixaban 5 mg BID - Coreg as above  #Alcohol use disorder CIWA with Ativan PRN for score above 10. Modified normal protocol to prevent oversedation. Has not required ativan overnight. - off librium  - CIWA - Thiamine, Folate, MV - CMP, Mg, P  #Alcoholic hepatitis  vs alcoholic cirrhosis RUQ [place] consistent with cirrhosis ,also showed dilated common bile duct (50mm is normall, patients 6.31mm). Liver enzymes continuing to trend down.  - Trend CMP - lactulose not indicated at this time.  #UTI, history of E. Coli UTI - initial UA showing large leuks, few bacteria, WBC 21-50 >> appeared to be pyuria vs asymptomatic bacteriuria - patient was  asymptomatic since admission, however on 10/19 reporting that he feels a "nasty sensation" when urinating - repeat UA showing + nitrites, large leuks, many bacteria -had 3cc of urethral bleeding -started on Kephlex 500mg  BID for 5-day course -f/u urine cx  #Coffee ground emesis - resolved Had an episode of coffee ground emesis on night of 12/25/19. No additional episodes. Hgb stable at this time. -continue PPI -f/u outpatient GI for possible endoscopic evaluation  #Elevated Cr, AKI vs CKD 3a - resolved Likely prerenal azotemia. Baseline Cr appears to be around 1.1, but was around 1.5 3 months ago. Stable since admission. - Cr 1.48 > 1.04 > 1.2, continue to monitor BMP  #Tobacco use disorder - Nicotine patch   #Thrombocytopenia #Anemia - Hgb stable , platelets stable - Trend CBC  Prior to Admission Living Arrangement: Home Anticipated Discharge Location: TBD Barriers to Discharge: continued medical management Dispo: TBD   Virl Axe, MD 12/29/2019, 6:30 AM Pager: [phone] After 5pm on weekdays and 1pm on weekends: On Call pager [phone]

## 2019-12-29 NOTE — Discharge Instructions (Signed)

## 2019-12-30 ENCOUNTER — Other Ambulatory Visit (HOSPITAL_COMMUNITY): Payer: Self-pay | Admitting: Student

## 2019-12-30 ENCOUNTER — Telehealth: Payer: Self-pay | Admitting: Cardiology

## 2019-12-30 DIAGNOSIS — N390 Urinary tract infection, site not specified: Secondary | ICD-10-CM

## 2019-12-30 DIAGNOSIS — K219 Gastro-esophageal reflux disease without esophagitis: Secondary | ICD-10-CM

## 2019-12-30 DIAGNOSIS — E1122 Type 2 diabetes mellitus with diabetic chronic kidney disease: Secondary | ICD-10-CM

## 2019-12-30 DIAGNOSIS — B9689 Other specified bacterial agents as the cause of diseases classified elsewhere: Secondary | ICD-10-CM

## 2019-12-30 LAB — COMPREHENSIVE METABOLIC PANEL
ALT: 78 U/L — ABNORMAL HIGH (ref 0–44)
AST: 43 U/L — ABNORMAL HIGH (ref 15–41)
Albumin: 2.9 g/dL — ABNORMAL LOW (ref 3.5–5.0)
Alkaline Phosphatase: 72 U/L (ref 38–126)
Anion gap: 9 (ref 5–15)
BUN: 25 mg/dL — ABNORMAL HIGH (ref 6–20)
CO2: 27 mmol/L (ref 22–32)
Calcium: 8.7 mg/dL — ABNORMAL LOW (ref 8.9–10.3)
Chloride: 101 mmol/L (ref 98–111)
Creatinine, Ser: 1.37 mg/dL — ABNORMAL HIGH (ref 0.61–1.24)
GFR, Estimated: 56 mL/min — ABNORMAL LOW (ref >60)
Glucose, Bld: 114 mg/dL — ABNORMAL HIGH (ref 70–99)
Potassium: 4.6 mmol/L (ref 3.5–5.1)
Sodium: 137 mmol/L (ref 135–145)
Total Bilirubin: 0.4 mg/dL (ref 0.3–1.2)
Total Protein: 6.1 g/dL — ABNORMAL LOW (ref 6.5–8.1)

## 2019-12-30 MED ORDER — CEPHALEXIN 500 MG PO CAPS: 500.0000 mg | ORAL_CAPSULE | Freq: Two times a day (BID) | ORAL | 0 refills | Status: DC

## 2019-12-30 MED ORDER — APIXABAN 5 MG PO TABS: 5.0000 mg | ORAL_TABLET | Freq: Two times a day (BID) | ORAL | 0 refills | Status: DC

## 2019-12-30 MED ORDER — SACUBITRIL-VALSARTAN 24-26 MG PO TABS: 1.0000 | ORAL_TABLET | Freq: Two times a day (BID) | ORAL | 1 refills | Status: DC

## 2019-12-30 MED ORDER — SPIRONOLACTONE 25 MG PO TABS
25.0000 mg | ORAL_TABLET | Freq: Every day | ORAL | 1 refills | Status: DC
Start: 2019-12-30 — End: 2020-08-04

## 2019-12-30 MED ORDER — CARVEDILOL 25 MG PO TABS
25.0000 mg | ORAL_TABLET | Freq: Two times a day (BID) | ORAL | 1 refills | Status: DC
Start: 2019-12-30 — End: 2020-08-04

## 2019-12-30 MED ORDER — FUROSEMIDE 20 MG PO TABS
20.0000 mg | ORAL_TABLET | Freq: Every day | ORAL | 1 refills | Status: DC
Start: 2019-12-30 — End: 2020-08-04

## 2019-12-30 MED FILL — FUROSEMIDE 20 MG TAB: 20 | 30 days supply | Qty: 30 | Fill #0

## 2019-12-30 MED FILL — ELIQUIS 5 MG TABLET: 5 | 30 days supply | Qty: 60 | Fill #0

## 2019-12-30 MED FILL — CARVEDILOL 25 MG TABLET: 25 | 30 days supply | Qty: 60 | Fill #0

## 2019-12-30 MED FILL — ENTRESTO 24 MG-26 MG TABLET: 24-26 | 30 days supply | Qty: 60 | Fill #0

## 2019-12-30 MED FILL — SPIRONOLACTONE 25 MG TABLET: 25 | 30 days supply | Qty: 30 | Fill #0

## 2019-12-30 MED FILL — CEPHALEXIN 500 MG CAPS: 500 | 5 days supply | Qty: 10 | Fill #0

## 2019-12-30 NOTE — Progress Notes (Signed)
Patient refused lab reports going home does not need labs. He reports going to smoke cigarette has had enough of this place. I reported did not have discharge orders yet,so I contacted the teaching service they are going to come up and do the discharge. Pharmacy has come up and given patient his meds did his discharge teaching. He has dressed himself reports takes too long will leave on his on-MD aware.

## 2019-12-30 NOTE — TOC Transition Note (Signed)
Transition of Care [facility]) - CM/SW Discharge Note   Patient Details  Name: SAMUAL BEALS MRN: [id number] Date of Birth: [birth year]  Transition of Care [facility]) CM/SW Contact:  Zenon Mayo, RN Phone Number: 12/30/2019, 11:50 AM   Clinical Narrative:    Patient dc home today, has a follow up apt with [facility] clinic on [facility].   Final next level of care: Home/Self Care Barriers to Discharge: No Barriers Identified   Patient Goals and CMS Choice Patient states their goals for this hospitalization and ongoing recovery are:: get better CMS Medicare.gov Compare Post Acute Care list provided to:: Patient Choice offered to / list presented to : Patient  Discharge Placement                       Discharge Plan and Services   Discharge Planning Services: CM Consult Post Acute Care Choice: NA            DME Agency: NA       HH Arranged: NA, Refused HH          Social Determinants of Health (SDOH) Interventions Transportation Interventions: Intervention Not Indicated   Readmission Risk Interventions Readmission Risk Prevention Plan 12/27/2019  Transportation Screening Complete  PCP or Specialist Appt within 3-5 Days Complete  HRI or [place] Complete  Social Work Consult for [place] Planning/Counseling Complete  Palliative Care Screening Not Applicable  Medication Review [employer] photographer) Complete  Some recent data might be hidden

## 2019-12-30 NOTE — Plan of Care (Signed)

## 2019-12-30 NOTE — Discharge Summary (Addendum)
Name: Austin Cobb: [id number] DOB: [birth year] 59 y.o. PCP: Patient, No Pcp Per  Date of Admission: 12/24/2019  1:10 PM Date of Discharge: 12/30/2019 Attending Physician: Lenice Pressman, MD, PhD   Discharge Diagnosis: 1. Acute on chronic HFrEF Atrial Flutter w/variable blockade Enterobacter aerogenes UTI  Discharge Medications: Allergies as of 12/30/2019       Reactions   Adhesive [tape] Other (See Comments)   Heat and EKG pads flare pre-existing eczema   Heparin Other (See Comments)   HIT ab +, SRA  negative        Medication List     STOP taking these medications    amiodarone 200 MG tablet Commonly known as: PACERONE   aspirin EC 81 MG tablet   digoxin 0.125 MG tablet Commonly known as: LANOXIN       TAKE these medications    apixaban 5 MG Tabs tablet Commonly known as: ELIQUIS Take 1 tablet (5 mg total) by mouth 2 (two) times daily.   carvedilol 25 MG tablet Commonly known as: COREG Take 1 tablet (25 mg total) by mouth 2 (two) times daily with a meal. What changed:  medication strength how much to take   cephALEXin 500 MG capsule Commonly known as: KEFLEX Take 1 capsule (500 mg total) by mouth 2 (two) times daily.   cholecalciferol 25 MCG (1000 UNIT) tablet Commonly known as: VITAMIN D3 Take 2,000 Units by mouth daily.   feeding supplement Liqd Take 237 mLs by mouth in the morning, at noon, in the evening, and at bedtime.   ferrous sulfate 324 MG Tbec Take 324 mg by mouth daily with breakfast.   furosemide 20 MG tablet Commonly known as: LASIX Take 1 tablet (20 mg total) by mouth daily.   lactulose 10 GM/15ML solution Commonly known as: CHRONULAC Take 30 mLs (20 g total) by mouth 2 (two) times daily.   multivitamin with minerals Tabs tablet Take 1 tablet by mouth daily.   sacubitril-valsartan 24-26 MG Commonly known as: ENTRESTO Take 1 tablet by mouth 2 (two) times daily.   spironolactone 25 MG tablet Commonly known  as: ALDACTONE Take 1 tablet (25 mg total) by mouth daily. What changed: how much to take        Disposition and follow-up:   [name] was discharged from [facility] in [place] condition.  At the hospital follow up visit please address:  1.  AoC HFrEF. Diuresed down to dry weight of 61.7kg. Discharged with Entresto 24-19m BID, spironolactone 221mdaily, lasix 2066maily, and coreg 25m100mD. Will [facility]. [facility] on January 12, 2020.  Atrial Flutter w/ variable blockade. Will discharge with coreg 25mg66m and eliquis 5mg B64m follow up rate control and consider eval for ablation.   Enterobacter aerogenes UTI. Hx of E coli UTI. Initially started on keflex 500mg B43ms urine culture showing >100,000 GNR. After discharge, found to be Enterobacter aerogenes, resistant to cefazolin, sensitive to ceftriaxone, sent in cefdinir for better coverage. Follow up to ensure hematuria has resolved.   Alcohol Use Disorder, polysubstance use. Alcoholic cirrhosis vs alcoholic hepatitis. Emphasize avoidance of alcohol and cocaine.   AKI on CKDIIIa. Baseline creatinine variable between 1.1 - 1.4. Cr 1.53 on admission. Cr 1.37 on day of discharge, repeat at follow up visit.  2.  Labs / imaging needed at time of follow-up: CBC,  CMP, repeat UA in 1 month.  3.  Pending labs/ test needing follow-up: NA  Follow-up Appointments:  Follow-up Information     Donato Heinz, MD.   Specialties: Cardiology, Radiology Why: [place] - a follow-up has been arranged for you with Dr. Gardiner Rhyme on Monday January 03, 2020 at 8:20 AM (Arrive by 8:05 AM). Contact information: [place] [address] [place].   Why: GO: NOVEMBER 3 AT 10:50AM Contact  information: [place] [address] [place] [facility] Course by problem list: 1.  AoC HFrEF. TTE on 12/25/19 showing EF 25-30% with global hypokinesis, unchanged from prior. Not candidate for advanced interventions due to medication non-compliance and recent cocaine use. BNP 2812. Diuresed down to dry weight of 61.7kg. Able to maintain euvolemia with lasix 5m daily and spironolactone 274mdaily. Discharged with Entresto 24-2623mID, spironolactone 80m51mily, lasix 20mg69mly, and coreg 80mg 76m Will f/u with cardiology, [facility].  Atrial Flutter w/ variable blockade. On eliquis 5mg BI40mt home. Initially loaded with digoxin, but due to medication non-compliance history, switched to coreg. Increased coreg to 80mg BI57mfore finally achieving adequate rate control. Will discharge with coreg 80mg BID30m eliquis 5mg BID. 53mterobacter aerogenes UTI. Hx of E coli UTI. Initially had asymptomatic bacteriuria as patient was not complaining of symptoms. On 10/19, began complaining of urinary symptoms and had one episode of urethral bleeding (3cc). Repeat urinalysis showing positive nitrites, large leuks, many bacteria. Initially started on keflex 500mg BID a91mine culture showing >100,000 GNR. After discharge, found to be Enterobacter aerogenes, resistant to cefazolin, sensitive to ceftriaxone, sent in cefdinir for better coverage. Please ensure hematuria has resolved, if not will need urology evaluation.  Alcohol Use Disorder, polysubstance use. Alcoholic cirrhosis vs alcoholic hepatitis. RUQ U/S consistent with possible cirrhosis, also showed dilated common bile duct (6.9mm). Requi35m CIWA with ativan and librium for a couple of days during admission, but tolerated taper. Has not required ativan for the past several  days. Emphasized avoidance of alcohol and cocaine.   AKI on CKDIIIa. Baseline creatinine variable between 1.1 - 1.4. Cr 1.53 on admission. Cr 1.37 on day of discharge.   Discharge Vitals:   BP 115/90 (BP Location: Right Arm)   Pulse (!) 53   Temp 98.3 F (36.8 C) (Oral)   Resp 20   Ht 5' 10"  (1.778 m)   Wt 61.9 kg   SpO2 100%   BMI 19.58 kg/m   Pertinent Labs, Studies, and Procedures:  CBC Latest Ref Rng & Units 12/28/2019 12/27/2019 12/26/2019  WBC 4.0 - 10.5 K/uL 5.7 6.9 6.9  Hemoglobin 13.0 - 17.0 g/dL 11.5(L) 12.1(L) 12.8(L)  Hematocrit 39 - 52 % 34.7(L) 37.9(L) 38.3(L)  Platelets 150 - 400 K/uL 154 148(L) 149(L)   CMP Latest Ref Rng & Units 12/30/2019 12/29/2019 12/28/2019  Glucose 70 - 99 mg/dL 114(H) 129(H) 74  BUN 6 - 20 mg/dL 25(H) 21(H) 21(H)  Creatinine 0.61 - 1.24 mg/dL 1.37(H) 1.20 1.04  Sodium 135 - 145 mmol/L 137 137 139  Potassium 3.5 - 5.1 mmol/L 4.6 4.2 4.6  Chloride 98 - 111 mmol/L 101 98 101  CO2 22 - 32 mmol/L 27 31 28   Calcium 8.9 - 10.3 mg/dL 8.7(L) 8.8(L) 8.7(L)  Total Protein 6.5 - 8.1 g/dL 6.1(L) 6.2(L) 5.8(L)  Total Bilirubin 0.3 - 1.2 mg/dL 0.4 0.2(L) 0.8  Alkaline Phos 38 - 126 U/L 72 73 74  AST 15 - 41 U/L 43(H) 59(H) 73(H)  ALT 0 - 44 U/L 78(H) 100(H) 116(H)   Hepatic Function Latest Ref Rng & Units 12/30/2019 12/29/2019 12/28/2019  Total Protein 6.5 - 8.1 g/dL 6.1(L) 6.2(L) 5.8(L)  Albumin 3.5 - 5.0 g/dL 2.9(L) 3.0(L) 2.8(L)  AST 15 - 41 U/L 43(H) 59(H) 73(H)  ALT 0 - 44 U/L 78(H) 100(H) 116(H)  Alk Phosphatase 38 - 126 U/L 72 73 74  Total Bilirubin 0.3 - 1.2 mg/dL 0.4 0.2(L) 0.8  Bilirubin, Direct 0.0 - 0.2 mg/dL - - -   Urinalysis    Component Value Date/Time   COLORURINE AMBER (A) 12/28/2019 1656   APPEARANCEUR CLOUDY (A) 12/28/2019 1656   LABSPEC 1.016 12/28/2019 1656   PHURINE 8.0 12/28/2019 1656   GLUCOSEU NEGATIVE 12/28/2019 1656   HGBUR NEGATIVE 12/28/2019 1656   BILIRUBINUR NEGATIVE 12/28/2019 1656   KETONESUR NEGATIVE  12/28/2019 1656   PROTEINUR NEGATIVE 12/28/2019 1656   NITRITE POSITIVE (A) 12/28/2019 1656   LEUKOCYTESUR LARGE (A) 12/28/2019 1656    Drugs of Abuse     Component Value Date/Time   LABOPIA NONE DETECTED 12/25/2019 0055   COCAINSCRNUR POSITIVE (A) 12/25/2019 0055   LABBENZ NONE DETECTED 12/25/2019 0055   AMPHETMU NONE DETECTED 12/25/2019 0055   THCU NONE DETECTED 12/25/2019 0055   LABBARB NONE DETECTED 12/25/2019 0055    Blood Culture    Component Value Date/Time   SDES URINE, CLEAN CATCH 12/28/2019 1646   SPECREQUEST NONE 12/28/2019 1646   CULT (A) 12/28/2019 1646    >=100,000 COLONIES/mL ENTEROBACTER AEROGENES SUSCEPTIBILITIES TO FOLLOW Performed at [facility], [address] [place], [place] [address]    REPTSTATUS PENDING 12/28/2019 1646   Lactic Acid, Venous    Component Value Date/Time   LATICACIDVEN 1.0 12/25/2019 0612   BNP    Component Value Date/Time   BNP 2,812.2 (H) 12/24/2019 1345     Discharge Instructions: Discharge Instructions     (HEART FAILURE PATIENTS) Call MD:  Anytime you have any of the following symptoms: 1) 3 pound weight gain in 24 hours or 5 pounds in 1 week 2) shortness of breath, with or without a dry hacking cough 3) swelling in the hands, feet or stomach 4) if you have to sleep on extra pillows at night in order to breathe.   Complete by: As directed    AMB Referral to [facility]   Complete by: As directed    Please refer to community RN for post hospital follow up.  Netta Cedars, MSN, RN [facility] Liaison Nurse Mobile Phone [phone]  Toll free office [phone]   Reason for consult: Post hospital follow up   Diagnoses of: Heart Failure   Expected date of contact: 1-3 days (reserved for hospital discharges)   Call MD for:  difficulty breathing, headache or visual disturbances   Complete by: As directed    Call MD for:  extreme fatigue   Complete by: As directed    Call MD for:  hives    Complete by: As directed    Call MD for:  persistant dizziness or light-headedness   Complete by: As directed    Call MD for:  persistant nausea  and vomiting   Complete by: As directed    Call MD for:  redness, tenderness, or signs of infection (pain, swelling, redness, odor or green/yellow discharge around incision site)   Complete by: As directed    Call MD for:  severe uncontrolled pain   Complete by: As directed    Call MD for:  temperature >100.4   Complete by: As directed    Diet - low sodium heart healthy   Complete by: As directed    Discharge instructions   Complete by: As directed    Mr. Tanguma, it was a pleasure taking care of you during your time here. You came in with an exacerbation of your heart failure and atrial flutter. We also found that you have a UTI.  We will be sending you home with 6 medications that are very important for you to take. For your UTI, we are sending you home with a 5-day course of Cephalexin (Keflex) 527m (take twice daily).   For your heart failure and atrial flutter, we are sending you home with entresto twice daily, carvedilol 260m(twice daily), furosemide 2035mnce daily, spironolactone 4m66mce daily, and eliquis 5mg 17mce daily. Please make sure to take these medications as directed.  We have scheduled two hospital follow up visits for you. One is with a heart doctor, [facility] that is scheduled for January 03, 2020 (this upcoming Monday) at 8:20AM. The second visit is to help you [facility], which is scheduled for January 12, 2020 at 10:50AM. Please make sure to go to both of these visits.  As we discussed, it is extremely important to avoid drinking alcohol and using drugs (especially cocaine) so that we can prevent your heart failure from worsening.   Increase activity slowly   Complete by: As directed        Signed: JinwaVirl Axe 12/30/2019, 5:18 PM   Pager: [phone]

## 2019-12-30 NOTE — Progress Notes (Signed)
Subjective:   Reports feeling well and says he could jog around the block at his age. He says his hematuria has resolved. He has no dizziness or lightheaded feeling. He ask [place] to update his caregiver, [name].  Spoke with [name] and informed her of plan to discharge home and reviewed medications that patient will be discharged with. Also updated [name] on patient's upcoming follow up visits.    Objective:  Vital signs in last 24 hours: Vitals:   12/29/19 1721 12/29/19 2114 12/29/19 2356 12/30/19 0506  BP: 135/87 118/69 122/86 113/86  Pulse: (!) 103 96 (!) 101   Resp: 18 20 20 20   Temp: 98.4 F (36.9 C) 99.1 F (37.3 C) 98.9 F (37.2 C) 98.6 F (37 C)  TempSrc: Oral Oral Oral Oral  SpO2: 100% 98% 99% 100%  Weight:      Height:        General: male, appears older than stated age, sitting up in bed, NAD CV: slightly tachycardic, irregular rhythm, no m/r/g Pulm: CTABL, no adventitious sounds noted Abdomen: soft, nontender, nondistended, +BS Neuro: AAOx3, no focal deficits MSK: no edema on exam    Assessment/Plan:  Principal Problem:   Acute on chronic combined systolic and diastolic CHF (congestive heart failure) (HCC) Active Problems:   Tobacco abuse   Atrial flutter with rapid ventricular response (HCC)   Alcohol withdrawal (HCC)   Polysubstance abuse (HCC)   Acute on chronic heart failure (HCC)  Austin Cobb is a 59 year old male with a past medical history of tobacco use disorder, alcohol use disorder, bipolar 1 disorder, GERD, atrial fibrillation,  and systolic heart failure (EF 25-30%) presents with dyspnea on exertion and fatigue over 1 month.   #Acute on chronic HFrEF  Echo on 10/16 consistent with previous TTE, EF 25-30% with global hypokinesis. Appreciate HF cardiology seeing patient. He is not a candidate for advance interventions at this time due to his non-compliance and recent cocaine use. Will increase his coreg again today as he is still not completely  rate controlled at this time. Appears euvolemic on exam. Will give another dose of lasix 20mg  to maintain euvolemia. Weight today is 61.7kg, which appears to be his dry weight. Once adequate rate control is achieved, can consider discharge with outpatient f/u for HF and A-flutter. -Continue Entresto, spironolactone, coreg, and lasix at discharge  #Atrial Flutter w/ variable blockade Patient loaded with digoxin. Given patient is inconsistent with taking medication it was recommended to stop digoxin. Continues to be in atrial flutter with variable blockade on telemetry review, rate getting closer to being controlled. HR 106-108 despite coreg 12.5mg  given last night. Will increase coreg to 25mg  to optimize rate control prior to discharge. BP is stable. - continue coreg and eliquis at discharge  #Alcohol use disorder CIWA with Ativan PRN for score above 10. Modified normal protocol to prevent oversedation. Has not required ativan for past few days. Discussed importance of avoiding alcohol consumption.  #Alcoholic hepatitis vs alcoholic cirrhosis RUQ [place] consistent with cirrhosis ,also showed dilated common bile duct (6mm is normall, patients 6.49mm). Liver enzymes significantly decreased since admission.  #UTI  History of E. Coli UTI Initial UA showing large leuks, few bacteria, WBC 21-50 >> appeared to be pyuria vs asymptomatic bacteriuria. Patient was asymptomatic since admission, however on 10/19 reporting that he feels a "nasty sensation" when urinating. Repeat UA showing + nitrites, large leuks, many bacteria. Patient also had one episode of 3cc of urethral bleeding. -urine cx showing >100,000 GNR -will  send home with kephlex 500mg  BID  #Coffee ground emesis - resolved Had an episode of coffee ground emesis on night of 12/25/19. No additional episodes. Hgb stable at this time. -continue PPI -f/u outpatient GI for possible endoscopic evaluation  #Elevated Cr, AKI vs CKD 3a - resolved Likely  prerenal azotemia. Baseline Cr appears to be around 1.1, but was around 1.5 3 months ago, so has been variable. - Cr 1.37 today, within his usual variable range.  #Tobacco use disorder - Counseled on smoking cessation   #Thrombocytopenia #Anemia - Hgb stable , platelets stable  Prior to Admission Living Arrangement: Home Anticipated Discharge Location: Home Barriers to Discharge: none Dispo: Today  Virl Axe, MD 12/30/2019, 7:05 AM Pager: [phone] After 5pm on weekdays and 1pm on weekends: On Call pager [phone]

## 2019-12-30 NOTE — Telephone Encounter (Signed)
Haley from the pharmacy team at [facility] is faxing over a patient assistance form and is requesting for Dr. Gardiner Rhyme to sign off on if possible. Please call/advise.   Thank you!!

## 2019-12-30 NOTE — Progress Notes (Signed)
Walked patient out to his [employer] ride for ride home,he left with his meds and discharge paperwork.

## 2019-12-30 NOTE — Consult Note (Signed)
   [facility] CM Inpatient Consult   12/30/2019  Austin Cobb 04/09/1960 [id number]   Patient chart reviewed for potential [place] [facility] CM) services due to high unplanned readmission risk score, 34%. Patient is on the [facility] registry as a benefit of their [employer].  Per review, patient has outpatient PCP follow up appointment scheduled with [place]. Unable to speak with patient prior to discharge. Discussed in progression meeting, patient refused home health. Referral will be placed to community [facility] CM RN to offer disease management services.  Of note, [facility] Care Management services does not replace or interfere with any services that are arranged by inpatient case management or social work.  Netta Cedars, MSN, [facility] Liaison Nurse Mobile Phone [phone]  Toll free office [phone]

## 2019-12-30 NOTE — Care Management Important Message (Signed)
Important Message  Patient Details  Name: XAVIER MUNGER MRN: [id number] Date of Birth: [birth year]   Medicare Important Message Given:  Yes     [name] 12/30/2019, 8:38 AM

## 2019-12-30 NOTE — Progress Notes (Signed)
Heart Failure Stewardship Pharmacist Progress Note   PCP: Patient, No Pcp Per PCP-Cardiologist: Donato Heinz, MD    HPI:  59 yo M with PMH of NICM, polysubstance abuse, nonobstructive CAD, anxiety, bipolar 1 disorder, depression, paroxysmal atrial flutter, CKD III and medication noncompliance. He presented to the ED on 12/24/19 with shortness of breath, orthopnea, PND, abdominal swelling in the context of recurrent substance abuse and medication noncompliance. Found to have recurrent atrial flutter and CHF. ECHO done on 12/25/19 and LVEF is 25-30%  Current HF Medications: Furosemide 20 mg daily Carvedilol 25 mg BID Entresto 24/26 mg BID Spironolactone 12.5 mg daily  Prior to admission HF Medications: Not taking medications  Pertinent Lab Values: . Serum creatinine 1.37, BUN 25, Potassium 4.6, Sodium 137, BNP 2812.2  Vital Signs: . Weight: 136 lbs (admission weight: 135 lbs) . Blood pressure: 110/80s  . Heart rate: 100s   Medication Assistance / Insurance Benefits Check: Does the patient have prescription insurance?  Yes Type of insurance plan: [employer] Medicare  Does the patient qualify for medication assistance through manufacturers or grants?   Yes . Eligible grants and/or patient assistance programs: Entresto, Eliquis . Medication assistance applications in progress: Eliquis . Medication assistance applications approved: Entresto Approved medication assistance renewals will be completed by: Dr. Newman Nickels office  Outpatient Pharmacy:  Prior to admission outpatient pharmacy: [facility] Is the patient willing to use [place] at discharge? Yes Is the patient willing to transition their outpatient pharmacy to utilize a [facility] outpatient pharmacy?   Pending    Assessment: 1. Acute on chronic systolic CHF (EF 92-42%), due to NICM. NYHA class II/III symptoms. - Continue furosemide 20 mg daily - Agree with increasing carvedilol to 25 mg BID -  Continue Entresto 24/26 mg BID - Spironolactone 12.5 mg daily - discharge with target dose of 25 mg daily. - Consider starting SGLT2i as outpatient. Currently being treated for UTI. - Per cardiology note, hold off restarting digoxin given medication noncompliance   Plan: 1) Medication changes recommended at this time: - Continue current therapy as listed above and discharge today  2) Patient assistance - [name] and Eliuqis copays >$300 each with insurance - Entresto patient assistance application completed and faxed to [employer]. Application approved on 12/29/19. - Eliquis patient assistance application started on 68/34/19.  3)  Education  - Patient has been educated on current HF medications (Entresto, carvedilol, spironolactone, furosemide) and potential additions to HF medication regimen (SGLT2i) - Patient verbalizes understanding that over the next few months, these medication doses may change and more medications may be added to optimize HF regimen - Patient has been educated on basic disease state pathophysiology and goals of therapy - Time spent (30 min)   Kerby Nora, PharmD, BCPS Heart Failure Stewardship Pharmacist Phone [phone]

## 2019-12-31 ENCOUNTER — Telehealth: Payer: Self-pay | Admitting: Student

## 2019-12-31 ENCOUNTER — Other Ambulatory Visit: Payer: Self-pay

## 2019-12-31 LAB — URINE CULTURE: Culture: >=100000 — AB

## 2019-12-31 MED ORDER — CEFDINIR 300 MG PO CAPS
300.0000 mg | ORAL_CAPSULE | Freq: Two times a day (BID) | ORAL | 0 refills | Status: DC
Start: 2019-12-31 — End: 2020-07-31

## 2019-12-31 NOTE — Patient Outreach (Signed)
[facility]) Care Management  12/31/2019  Austin Cobb April 08, 1960 [id number]     Transition of Care Referral  Referral Date: 10/212021 Referral Source: [facility] Liaison Date of Discharge: 12/30/2019 Facility: [place] [facility]    Outreach attempt # 1 to patient. No answer. RN CM left HIPAA compliant voicemail message along with contact info.    Plan: RN CM will make outreach attempt to patient within 3-4 business days. RN CM will send unsuccessful outreach letter to patient.   [name], RN,BSN,CCM [facility] Telephonic Care Management Coordinator Direct Phone: [phone] Toll Free: [phone] Fax: [phone]

## 2019-12-31 NOTE — Telephone Encounter (Cosign Needed)
Patient's urine culture results showing Enterobacter aerogenes that is resistant to cephalexin. Will change abx coverage to cefdinir 300mg  BID for 5-day course. Called [name] (patient's caregiver) and informed her of this change. She will pick up new abx from preferred pharmacy.

## 2020-01-02 NOTE — Progress Notes (Deleted)
Cardiology Office Note:    Date:  01/02/2020   ID:  Austin Cobb, DOB 05/09/1960, MRN [id number]  PCP:  Patient, No Pcp Per  Cardiologist:  Donato Heinz, MD  Electrophysiologist:  None   Referring MD: No ref. provider found   No chief complaint on file. ***  History of Present Illness:    Austin Cobb is a 58 y.o. male with a hx of chronic combined systolic and diastolic heart failure, atrial flutter, alcohol abuse, possible cirrhosis, CKD stage IIIa who presents as a hospital follow-up.  He was admitted to [facility] from 10/15 through 12/30/2019.  Presented with decompensated heart failure.  Echocardiogram on 10/16 showed EF 25 to 30% with global hypokinesis.  Evaluated by advanced heart failure, not a candidate for advanced therapies due to medication noncompliance and cocaine use.  Responded well to IV diuresis, was discharged on Entresto 24-26 mg twice daily, spironolactone 25 mg daily, Coreg 25 mg twice daily, and Lasix 20 mg daily.  Also noted to be in atrial flutter with variable block, rate controlled with carvedilol 25 mg twice daily.  On Eliquis 5 mg twice daily for anticoagulation.  He also had alcohol withdrawal during admission, required CIWA with Ativan and Librium during admission.  Baseline creatinine 1.1-1.4, was up to 1.5 on admission.  Improved to 1.37 on discharge.  Cath on 05/18/19 showed nonobstructive CAD.  Past Medical History:  Diagnosis Date  . Anxiety   . Arthritis   . Bipolar 1 disorder ([place])   . Depression    Bipolar, not on meds/pt. stopped meds on own over 12 yrs ago  . GERD (gastroesophageal reflux disease)   . History of colon polyps   . Manic disorder ([place])   . Noncompliance 05/14/2019  . Substance abuse ([place])    85yrs ago smoke crack and marjuana  . Tobacco abuse 05/13/2012    Past Surgical History:  Procedure Laterality Date  . CARDIOVERSION N/A 05/06/2019   Procedure: CARDIOVERSION;  Surgeon: Acie Fredrickson Wonda Cheng, MD;  Location: [facility]  ENDOSCOPY;  Service: Cardiovascular;  Laterality: N/A;  . CARDIOVERSION N/A 05/20/2019   Procedure: CARDIOVERSION;  Surgeon: Larey Dresser, MD;  Location: [facility] ENDOSCOPY;  Service: Cardiovascular;  Laterality: N/A;  . EXAMINATION UNDER ANESTHESIA N/A 11/12/2012   Procedure: EXAM UNDER ANESTHESIA;  Surgeon: Adin Hector, MD;  [facility] ORS;  Service: General;  Laterality: N/A;  . EYE SURGERY     bilateral eye sx as a child 6-72yrs old.  Marland Kitchen RIGHT/LEFT HEART CATH AND CORONARY ANGIOGRAPHY N/A 05/18/2019   Procedure: RIGHT/LEFT HEART CATH AND CORONARY ANGIOGRAPHY;  Surgeon: Larey Dresser, MD;  Location: [place] CV LAB;  Service: Cardiovascular;  Laterality: N/A;  . SPHINCTEROTOMY N/A 11/12/2012   Procedure: SPHINCTEROTOMY;  Surgeon: Adin Hector, MD;  [facility] ORS;  Service: General;  Laterality: N/A;  . TEE WITHOUT CARDIOVERSION N/A 05/06/2019   Procedure: TRANSESOPHAGEAL ECHOCARDIOGRAM (TEE);  Surgeon: Acie Fredrickson Wonda Cheng, MD;  Location: [place];  Service: Cardiovascular;  Laterality: N/A;  . TEE WITHOUT CARDIOVERSION N/A 05/20/2019   Procedure: TRANSESOPHAGEAL ECHOCARDIOGRAM (TEE);  Surgeon: Larey Dresser, MD;  Location: [facility] ENDOSCOPY;  Service: Cardiovascular;  Laterality: N/A;  . TONSILLECTOMY      Current Medications: No outpatient medications have been marked as taking for the 01/03/20 encounter (Appointment) with Donato Heinz, MD.     Allergies:   Adhesive [tape] and Heparin   Social History   Socioeconomic History  . Marital status: Divorced  Spouse name: Not on file  . Number of children: Not on file  . Years of education: Not on file  . Highest education level: Not on file  Occupational History  . Not on file  Tobacco Use  . Smoking status: Current Every Day Smoker    Packs/day: 1.00    Years: 35.00    Pack years: 35.00    Types: Cigarettes  . Smokeless tobacco: Never Used  Substance and Sexual Activity  . Alcohol use: Yes    Alcohol/week:  4.0 standard drinks    Types: 4 Cans of beer per week    Comment: 4 cans of beer a day   . Drug use: Yes    Types: Cocaine, Marijuana    Comment: Quit x6 yrs ago-   . Sexual activity: Never  Other Topics Concern  . Not on file  Social History Narrative  . Not on file   Social Determinants of Health   Financial Resource Strain:   . Difficulty of Paying Living Expenses: Not on file  Food Insecurity:   . Worried About Charity fundraiser in the Last Year: Not on file  . Ran Out of Food in the Last Year: Not on file  Transportation Needs: No Transportation Needs  . Lack of Transportation (Medical): No  . Lack of Transportation (Non-Medical): No  Physical Activity:   . Days of Exercise per Week: Not on file  . Minutes of Exercise per Session: Not on file  Stress:   . Feeling of Stress : Not on file  Social Connections:   . Frequency of Communication with Friends and Family: Not on file  . Frequency of Social Gatherings with Friends and Family: Not on file  . Attends Religious Services: Not on file  . Active Member of Clubs or Organizations: Not on file  . Attends Archivist Meetings: Not on file  . Marital Status: Not on file     Family History: The patient's ***family history is not on file.  ROS:   Please see the history of present illness.    *** All other systems reviewed and are negative.  EKGs/Labs/Other Studies Reviewed:    The following studies were reviewed today: ***  EKG:  EKG is *** ordered today.  The ekg ordered today demonstrates ***  Recent Labs: 05/04/2019: TSH 5.358 12/24/2019: B Natriuretic Peptide 2,812.2 12/28/2019: Hemoglobin 11.5; Magnesium 1.7; Platelets 154 12/30/2019: ALT 78; BUN 25; Creatinine, Ser 1.37; Potassium 4.6; Sodium 137  Recent Lipid Panel    Component Value Date/Time   CHOL 76 05/05/2019 1006   TRIG 41 05/05/2019 1006   HDL 33 (L) 05/05/2019 1006   CHOLHDL 2.3 05/05/2019 1006   VLDL 8 05/05/2019 1006   LDLCALC 35  05/05/2019 1006    Physical Exam:    VS:  There were no vitals taken for this visit.    Wt Readings from Last 3 Encounters:  12/30/19 136 lb 7.4 oz (61.9 kg)  09/06/19 135 lb (61.2 kg)  05/26/19 122 lb 6.4 oz (55.5 kg)     GEN: *** Well nourished, well developed in no acute distress HEENT: Normal NECK: No JVD; No carotid bruits LYMPHATICS: No lymphadenopathy CARDIAC: ***RRR, no murmurs, rubs, gallops RESPIRATORY:  Clear to auscultation without rales, wheezing or rhonchi  ABDOMEN: Soft, non-tender, non-distended MUSCULOSKELETAL:  No edema; No deformity  SKIN: Warm and dry NEUROLOGIC:  Alert and oriented x 3 PSYCHIATRIC:  Normal affect   ASSESSMENT:    No diagnosis  found. PLAN:     Chronic combined systolic and diastolic heart failure: TTE on 12/25/19 showed EF 25-30% with global hypokinesis.  NICM, cath on 05/18/19 showed nonobstructive CAD. Evaluated by advanced heart failure during recent admission, not a candidate for advanced therapies due to medication noncompliance and cocaine use.   -Continue Entresto 24-26 mg twice daily -Continue spironolactone 25 mg daily -Continue Coreg 25 mg twice daily -Continue Lasix 20 mg daily.    Atrial Flutter: CHADS2-VASc score 2 (HT, CHF). -Continue carvedilol 25 mg twice daily -Continue Eliquis 5 mg twice daily  Alcohol abuse: Risk of alcohol abuse discussed encourage cessation  Cocaine use: Risk of cocaine use discussed, encouraged cessation  CKD stage IIIa: Creatinine 1.37 on 1021/21  RTC in***  Medication Adjustments/Labs and Tests Ordered: Current medicines are reviewed at length with the patient today.  Concerns regarding medicines are outlined above.  No orders of the defined types were placed in this encounter.  No orders of the defined types were placed in this encounter.   There are no Patient Instructions on file for this visit.   Signed, Donato Heinz, MD  01/02/2020 12:53 PM    [place]

## 2020-01-03 ENCOUNTER — Ambulatory Visit: Payer: Medicare PPO | Admitting: Cardiology

## 2020-01-03 ENCOUNTER — Other Ambulatory Visit: Payer: Self-pay

## 2020-01-03 NOTE — Telephone Encounter (Signed)
Patient assistance signed and faxed to [facility]

## 2020-01-03 NOTE — Patient Outreach (Signed)
[facility]) Care Management  01/03/2020  Austin Cobb 12/03/1960 [id number]   Transition of Care Referral  Referral Date: 10/212021 Referral Source: [facility] Liaison Date of Discharge: 12/30/2019 Facility: [place] Medicare   Outreach attempt #2 to patient. No answer after multiple rings      Plan: RN CM will make outreach attempt to patient within 3-4 business days.   [name], RN,BSN,CCM [place] Management Telephonic Care Management Coordinator Direct Phone: [phone] Toll Free: [phone] Fax: [phone]

## 2020-01-04 ENCOUNTER — Other Ambulatory Visit: Payer: Self-pay

## 2020-01-04 NOTE — Patient Outreach (Signed)
[place] [facility]) Care Management  01/04/2020  ABNER ARDIS 12-Nov-1960 [id number]   Transition of Care Referral  Referral Date:10/212021 Referral [facility] Liaison Date of Discharge:12/30/2019 [place] Medicare   Unsuccessful outreach attempt  # 3 to patient.     Plan: RN CM will make outreach attempt to patient within 3-4 wks if no response from letter mailed to patient.  [name], RN,BSN,CCM [place] Management Telephonic Care Management Coordinator Direct Phone: [phone] Toll Free: [phone] Fax: [phone]

## 2020-01-05 ENCOUNTER — Telehealth (HOSPITAL_COMMUNITY): Payer: Self-pay

## 2020-01-05 NOTE — Telephone Encounter (Signed)
Contacted [employer] to follow up on Eliquis patient assistance forms. They said they had not received and application yet. Called the cardiology office to follow up and Glenard Haring said this was faxed to [place] by Dr. Newman Nickels nurse on 01/03/20. Will follow up again with [employer] at the end of the week.

## 2020-01-06 NOTE — Telephone Encounter (Signed)
refaxed patient assistance forms to [facility]

## 2020-01-06 NOTE — Telephone Encounter (Signed)
Austin Cobb called back in and stated that Austin Cobb did not rec' the form and would like to know if it can be refaxed   Fax number [phone]

## 2020-01-06 NOTE — Progress Notes (Deleted)
Cardiology Office Note:    Date:  01/06/2020   ID:  Austin Cobb, DOB 20-Sep-1960, MRN [id number]  PCP:  Patient, No Pcp Per  Cardiologist:  Donato Heinz, MD  Electrophysiologist:  None   Referring MD: No ref. provider found   No chief complaint on file. ***  History of Present Illness:    Austin Cobb is a 59 y.o. male with a hx of chronic combined systolic and diastolic heart failure, atrial flutter, alcohol abuse, possible cirrhosis, CKD stage IIIa who presents as a hospital follow-up.  He was admitted to [facility] from 10/15 through 12/30/2019.  Presented with decompensated heart failure.  Echocardiogram on 10/16 showed EF 25 to 30% with global hypokinesis.  Evaluated by advanced heart failure, not a candidate for advanced therapies due to medication noncompliance and cocaine use.  Responded well to IV diuresis, was discharged on Entresto 24-26 mg twice daily, spironolactone 25 mg daily, Coreg 25 mg twice daily, and Lasix 20 mg daily.  Also noted to be in atrial flutter with variable block, rate controlled with carvedilol 25 mg twice daily.  On Eliquis 5 mg twice daily for anticoagulation.  He also had alcohol withdrawal during admission, required CIWA with Ativan and Librium during admission.  Baseline creatinine 1.1-1.4, was up to 1.5 on admission.  Improved to 1.37 on discharge.  Cath on 05/18/19 showed nonobstructive CAD.  Past Medical History:  Diagnosis Date  . Anxiety   . Arthritis   . Bipolar 1 disorder ([place])   . Depression    Bipolar, not on meds/pt. stopped meds on own over 12 yrs ago  . GERD (gastroesophageal reflux disease)   . History of colon polyps   . Manic disorder ([place])   . Noncompliance 05/14/2019  . Substance abuse ([place])    77yrs ago smoke crack and marjuana  . Tobacco abuse 05/13/2012    Past Surgical History:  Procedure Laterality Date  . CARDIOVERSION N/A 05/06/2019   Procedure: CARDIOVERSION;  Surgeon: Acie Fredrickson Wonda Cheng, MD;  Location: [facility]  ENDOSCOPY;  Service: Cardiovascular;  Laterality: N/A;  . CARDIOVERSION N/A 05/20/2019   Procedure: CARDIOVERSION;  Surgeon: Larey Dresser, MD;  Location: [facility] ENDOSCOPY;  Service: Cardiovascular;  Laterality: N/A;  . EXAMINATION UNDER ANESTHESIA N/A 11/12/2012   Procedure: EXAM UNDER ANESTHESIA;  Surgeon: Adin Hector, MD;  [facility]S;  Service: General;  Laterality: N/A;  . EYE SURGERY     bilateral eye sx as a child 6-28yrs old.  Marland Kitchen RIGHT/LEFT HEART CATH AND CORONARY ANGIOGRAPHY N/A 05/18/2019   Procedure: RIGHT/LEFT HEART CATH AND CORONARY ANGIOGRAPHY;  Surgeon: Larey Dresser, MD;  Location: [place] CV LAB;  Service: Cardiovascular;  Laterality: N/A;  . SPHINCTEROTOMY N/A 11/12/2012   Procedure: SPHINCTEROTOMY;  Surgeon: Adin Hector, MD;  [facility];  Service: General;  Laterality: N/A;  . TEE WITHOUT CARDIOVERSION N/A 05/06/2019   Procedure: TRANSESOPHAGEAL ECHOCARDIOGRAM (TEE);  Surgeon: Acie Fredrickson Wonda Cheng, MD;  Location: [place];  Service: Cardiovascular;  Laterality: N/A;  . TEE WITHOUT CARDIOVERSION N/A 05/20/2019   Procedure: TRANSESOPHAGEAL ECHOCARDIOGRAM (TEE);  Surgeon: Larey Dresser, MD;  Location: [facility] ENDOSCOPY;  Service: Cardiovascular;  Laterality: N/A;  . TONSILLECTOMY      Current Medications: No outpatient medications have been marked as taking for the 01/07/20 encounter (Appointment) with Donato Heinz, MD.     Allergies:   Adhesive [tape] and Heparin   Social History   Socioeconomic History  . Marital status: Divorced  Spouse name: Not on file  . Number of children: Not on file  . Years of education: Not on file  . Highest education level: Not on file  Occupational History  . Not on file  Tobacco Use  . Smoking status: Current Every Day Smoker    Packs/day: 1.00    Years: 35.00    Pack years: 35.00    Types: Cigarettes  . Smokeless tobacco: Never Used  Substance and Sexual Activity  . Alcohol use: Yes    Alcohol/week:  4.0 standard drinks    Types: 4 Cans of beer per week    Comment: 4 cans of beer a day   . Drug use: Yes    Types: Cocaine, Marijuana    Comment: Quit x6 yrs ago-   . Sexual activity: Never  Other Topics Concern  . Not on file  Social History Narrative  . Not on file   Social Determinants of Health   Financial Resource Strain:   . Difficulty of Paying Living Expenses: Not on file  Food Insecurity:   . Worried About Charity fundraiser in the Last Year: Not on file  . Ran Out of Food in the Last Year: Not on file  Transportation Needs: No Transportation Needs  . Lack of Transportation (Medical): No  . Lack of Transportation (Non-Medical): No  Physical Activity:   . Days of Exercise per Week: Not on file  . Minutes of Exercise per Session: Not on file  Stress:   . Feeling of Stress : Not on file  Social Connections:   . Frequency of Communication with Friends and Family: Not on file  . Frequency of Social Gatherings with Friends and Family: Not on file  . Attends Religious Services: Not on file  . Active Member of Clubs or Organizations: Not on file  . Attends Archivist Meetings: Not on file  . Marital Status: Not on file     Family History: The patient's ***family history is not on file.  ROS:   Please see the history of present illness.    *** All other systems reviewed and are negative.  EKGs/Labs/Other Studies Reviewed:    The following studies were reviewed today: ***  EKG:  EKG is *** ordered today.  The ekg ordered today demonstrates ***  Recent Labs: 05/04/2019: TSH 5.358 12/24/2019: B Natriuretic Peptide 2,812.2 12/28/2019: Hemoglobin 11.5; Magnesium 1.7; Platelets 154 12/30/2019: ALT 78; BUN 25; Creatinine, Ser 1.37; Potassium 4.6; Sodium 137  Recent Lipid Panel    Component Value Date/Time   CHOL 76 05/05/2019 1006   TRIG 41 05/05/2019 1006   HDL 33 (L) 05/05/2019 1006   CHOLHDL 2.3 05/05/2019 1006   VLDL 8 05/05/2019 1006   LDLCALC 35  05/05/2019 1006    Physical Exam:    VS:  There were no vitals taken for this visit.    Wt Readings from Last 3 Encounters:  12/30/19 136 lb 7.4 oz (61.9 kg)  09/06/19 135 lb (61.2 kg)  05/26/19 122 lb 6.4 oz (55.5 kg)     GEN: *** Well nourished, well developed in no acute distress HEENT: Normal NECK: No JVD; No carotid bruits LYMPHATICS: No lymphadenopathy CARDIAC: ***RRR, no murmurs, rubs, gallops RESPIRATORY:  Clear to auscultation without rales, wheezing or rhonchi  ABDOMEN: Soft, non-tender, non-distended MUSCULOSKELETAL:  No edema; No deformity  SKIN: Warm and dry NEUROLOGIC:  Alert and oriented x 3 PSYCHIATRIC:  Normal affect   ASSESSMENT:    No diagnosis  found. PLAN:     Chronic combined systolic and diastolic heart failure: TTE on 12/25/19 showed EF 25-30% with global hypokinesis.  NICM, cath on 05/18/19 showed nonobstructive CAD. Evaluated by advanced heart failure during recent admission, not a candidate for advanced therapies due to medication noncompliance and cocaine use.   -Continue Entresto 24-26 mg twice daily -Continue spironolactone 25 mg daily -Continue Coreg 25 mg twice daily -Continue Lasix 20 mg daily.    Atrial Flutter: CHADS2-VASc score 2 (HT, CHF). -Continue carvedilol 25 mg twice daily -Continue Eliquis 5 mg twice daily  Alcohol abuse: Risk of alcohol abuse discussed encourage cessation  Cocaine use: Risk of cocaine use discussed, encouraged cessation  CKD stage IIIa: Creatinine 1.37 on 1021/21  RTC in***  Medication Adjustments/Labs and Tests Ordered: Current medicines are reviewed at length with the patient today.  Concerns regarding medicines are outlined above.  No orders of the defined types were placed in this encounter.  No orders of the defined types were placed in this encounter.   There are no Patient Instructions on file for this visit.   Signed, Donato Heinz, MD  01/06/2020 11:40 AM    [place]

## 2020-01-07 ENCOUNTER — Ambulatory Visit: Payer: Medicare PPO | Admitting: Cardiology

## 2020-01-11 ENCOUNTER — Other Ambulatory Visit: Payer: Self-pay | Admitting: Student

## 2020-01-11 NOTE — Telephone Encounter (Signed)
Notified from [employer] that patient's application was denied. They require pharmacy out-of-pocket expense record showing at least $136.36 spent at the pharmacy.  Called patient to discuss this and he is unsure how much he has spent and is concerned about the antibiotic discrepancy noted by Dr. Allyson Sabal on 12/31/19. He says this was never sent in to the pharmacy.  Called the pharmacy and they said this has been filled at the [facility] at [facility]. Patient requested to have this transferred back to [facility].   I called pharmacy on [facility] and they will complete transfer so this prescription can be filled at correct pharmacy.  Also discussed the out-of-pocket expense report and they state he does not meet the amount needed for approval of Eliquis. Patient will be eligible once the $136.36 spent is met. Will discuss this with [name] - his friend/caregiver in the morning per her request.

## 2020-01-12 ENCOUNTER — Inpatient Hospital Stay: Payer: Medicare PPO | Admitting: Physician Assistant

## 2020-01-22 DIAGNOSIS — M6281 Muscle weakness (generalized): Secondary | ICD-10-CM | POA: Diagnosis not present

## 2020-01-25 ENCOUNTER — Other Ambulatory Visit: Payer: Self-pay

## 2020-01-25 NOTE — Patient Outreach (Signed)
[facility]) Care Management  01/25/2020  Austin Cobb 05-Apr-1960 [id number]   Transition of Care Referral  Referral Date: 10/212021 Referral Source: [facility] Liaison Date of Discharge: 12/30/2019 Facility: [place] Medicare   Multiple attempts to establish contact with patient without success. No response from letter mailed to patient. Case is being closed at this time.    Plan: RN CM will close case at this time.   [name], RN,BSN,CCM [place] Management Telephonic Care Management Coordinator Direct Phone: [phone] Toll Free: [phone] Fax: [phone]

## 2020-02-21 DIAGNOSIS — M6281 Muscle weakness (generalized): Secondary | ICD-10-CM | POA: Diagnosis not present

## 2020-07-31 ENCOUNTER — Inpatient Hospital Stay (HOSPITAL_COMMUNITY)
Admission: EM | Admit: 2020-07-31 | Discharge: 2020-08-04 | DRG: 291 | Disposition: A | Discharge status: Home / Self Care | Payer: Medicare (Managed Care) | Attending: Internal Medicine | Admitting: Internal Medicine

## 2020-07-31 ENCOUNTER — Emergency Department (HOSPITAL_COMMUNITY): Payer: Medicare (Managed Care)

## 2020-07-31 DIAGNOSIS — Z91048 Other nonmedicinal substance allergy status: Secondary | ICD-10-CM

## 2020-07-31 DIAGNOSIS — I5042 Chronic combined systolic (congestive) and diastolic (congestive) heart failure: Secondary | ICD-10-CM | POA: Diagnosis not present

## 2020-07-31 DIAGNOSIS — Z86010 Personal history of colonic polyps: Secondary | ICD-10-CM

## 2020-07-31 DIAGNOSIS — I130 Hypertensive heart and chronic kidney disease with heart failure and stage 1 through stage 4 chronic kidney disease, or unspecified chronic kidney disease: Principal | ICD-10-CM | POA: Diagnosis present

## 2020-07-31 DIAGNOSIS — I483 Typical atrial flutter: Secondary | ICD-10-CM

## 2020-07-31 DIAGNOSIS — I5043 Acute on chronic combined systolic (congestive) and diastolic (congestive) heart failure: Secondary | ICD-10-CM | POA: Diagnosis present

## 2020-07-31 DIAGNOSIS — N1831 Chronic kidney disease, stage 3a: Secondary | ICD-10-CM | POA: Diagnosis present

## 2020-07-31 DIAGNOSIS — Z91120 Patient's intentional underdosing of medication regimen due to financial hardship: Secondary | ICD-10-CM | POA: Diagnosis not present

## 2020-07-31 DIAGNOSIS — I313 Pericardial effusion (noninflammatory): Secondary | ICD-10-CM | POA: Diagnosis present

## 2020-07-31 DIAGNOSIS — I428 Other cardiomyopathies: Secondary | ICD-10-CM | POA: Diagnosis present

## 2020-07-31 DIAGNOSIS — F1010 Alcohol abuse, uncomplicated: Secondary | ICD-10-CM | POA: Diagnosis present

## 2020-07-31 DIAGNOSIS — Z79899 Other long term (current) drug therapy: Secondary | ICD-10-CM

## 2020-07-31 DIAGNOSIS — I4892 Unspecified atrial flutter: Secondary | ICD-10-CM | POA: Diagnosis present

## 2020-07-31 DIAGNOSIS — T50996A Underdosing of other drugs, medicaments and biological substances, initial encounter: Secondary | ICD-10-CM | POA: Diagnosis present

## 2020-07-31 DIAGNOSIS — Z9114 Patient's other noncompliance with medication regimen: Secondary | ICD-10-CM | POA: Diagnosis not present

## 2020-07-31 DIAGNOSIS — F1410 Cocaine abuse, uncomplicated: Secondary | ICD-10-CM | POA: Diagnosis present

## 2020-07-31 DIAGNOSIS — F1910 Other psychoactive substance abuse, uncomplicated: Secondary | ICD-10-CM | POA: Diagnosis not present

## 2020-07-31 DIAGNOSIS — Z7901 Long term (current) use of anticoagulants: Secondary | ICD-10-CM

## 2020-07-31 DIAGNOSIS — Z599 Problem related to housing and economic circumstances, unspecified: Secondary | ICD-10-CM | POA: Diagnosis not present

## 2020-07-31 DIAGNOSIS — K219 Gastro-esophageal reflux disease without esophagitis: Secondary | ICD-10-CM | POA: Diagnosis present

## 2020-07-31 DIAGNOSIS — I10 Essential (primary) hypertension: Secondary | ICD-10-CM | POA: Diagnosis not present

## 2020-07-31 DIAGNOSIS — F17210 Nicotine dependence, cigarettes, uncomplicated: Secondary | ICD-10-CM | POA: Diagnosis present

## 2020-07-31 DIAGNOSIS — T45516A Underdosing of anticoagulants, initial encounter: Secondary | ICD-10-CM | POA: Diagnosis present

## 2020-07-31 DIAGNOSIS — I4891 Unspecified atrial fibrillation: Secondary | ICD-10-CM | POA: Diagnosis present

## 2020-07-31 DIAGNOSIS — Z888 Allergy status to other drugs, medicaments and biological substances status: Secondary | ICD-10-CM | POA: Diagnosis not present

## 2020-07-31 DIAGNOSIS — E872 Acidosis: Secondary | ICD-10-CM | POA: Diagnosis present

## 2020-07-31 DIAGNOSIS — K5900 Constipation, unspecified: Secondary | ICD-10-CM | POA: Diagnosis not present

## 2020-07-31 DIAGNOSIS — Z20822 Contact with and (suspected) exposure to covid-19: Secondary | ICD-10-CM | POA: Diagnosis present

## 2020-07-31 DIAGNOSIS — I248 Other forms of acute ischemic heart disease: Secondary | ICD-10-CM | POA: Diagnosis present

## 2020-07-31 DIAGNOSIS — F419 Anxiety disorder, unspecified: Secondary | ICD-10-CM | POA: Diagnosis present

## 2020-07-31 DIAGNOSIS — I2510 Atherosclerotic heart disease of native coronary artery without angina pectoris: Secondary | ICD-10-CM | POA: Diagnosis present

## 2020-07-31 DIAGNOSIS — F319 Bipolar disorder, unspecified: Secondary | ICD-10-CM | POA: Diagnosis present

## 2020-07-31 DIAGNOSIS — I427 Cardiomyopathy due to drug and external agent: Secondary | ICD-10-CM | POA: Diagnosis present

## 2020-07-31 LAB — CBC
HCT: 35.2 % — ABNORMAL LOW (ref 39.0–52.0)
Hemoglobin: 11.5 g/dL — ABNORMAL LOW (ref 13.0–17.0)
MCH: 30.9 pg (ref 26.0–34.0)
MCHC: 32.7 g/dL (ref 30.0–36.0)
MCV: 94.6 fL (ref 80.0–100.0)
Platelets: 168 10*3/uL (ref 150–400)
RBC: 3.72 MIL/uL — ABNORMAL LOW (ref 4.22–5.81)
RDW: 13.8 % (ref 11.5–15.5)
WBC: 7.2 10*3/uL (ref 4.0–10.5)
nRBC: 0 % (ref 0.0–0.2)

## 2020-07-31 LAB — BASIC METABOLIC PANEL
Anion gap: 8 (ref 5–15)
BUN: 20 mg/dL (ref 8–23)
CO2: 19 mmol/L — ABNORMAL LOW (ref 22–32)
Calcium: 8.6 mg/dL — ABNORMAL LOW (ref 8.9–10.3)
Chloride: 109 mmol/L (ref 98–111)
Creatinine, Ser: 1.27 mg/dL — ABNORMAL HIGH (ref 0.61–1.24)
GFR, Estimated: >60 mL/min (ref >60)
Glucose, Bld: 149 mg/dL — ABNORMAL HIGH (ref 70–99)
Potassium: 4.6 mmol/L (ref 3.5–5.1)
Sodium: 136 mmol/L (ref 135–145)

## 2020-07-31 LAB — RESP PANEL BY RT-PCR (FLU A&B, COVID) ARPGX2
Influenza A by PCR: NEGATIVE
Influenza B by PCR: NEGATIVE
SARS Coronavirus 2 by RT PCR: NEGATIVE

## 2020-07-31 LAB — RAPID URINE DRUG SCREEN, HOSP PERFORMED
Amphetamines: NOT DETECTED
Barbiturates: NOT DETECTED
Benzodiazepines: NOT DETECTED
Cocaine: POSITIVE — AB
Opiates: NOT DETECTED
Tetrahydrocannabinol: NOT DETECTED

## 2020-07-31 LAB — TROPONIN I (HIGH SENSITIVITY)
Troponin I (High Sensitivity): 50 ng/L — ABNORMAL HIGH (ref <18)
Troponin I (High Sensitivity): 49 ng/L — ABNORMAL HIGH (ref <18)

## 2020-07-31 LAB — BRAIN NATRIURETIC PEPTIDE: B Natriuretic Peptide: 2128 pg/mL — ABNORMAL HIGH (ref 0.0–100.0)

## 2020-07-31 MED ORDER — FOLIC ACID 1 MG PO TABS
1.0000 mg | ORAL_TABLET | Freq: Every day | ORAL | Status: DC
  Administered 2020-07-31 – 2020-08-04 (×5): 1 mg via ORAL
  Filled 2020-07-31 (×5): qty 1

## 2020-07-31 MED ORDER — THIAMINE HCL 100 MG/ML IJ SOLN
100.0000 mg | Freq: Every day | INTRAMUSCULAR | Status: DC
  Filled 2020-07-31: qty 2

## 2020-07-31 MED ORDER — SODIUM CHLORIDE 0.9 % IV BOLUS
500.0000 mL | Freq: Once | INTRAVENOUS | Status: AC
  Administered 2020-07-31: 500 mL via INTRAVENOUS

## 2020-07-31 MED ORDER — RIVAROXABAN 20 MG PO TABS
20.0000 mg | ORAL_TABLET | Freq: Every day | ORAL | Status: DC
  Administered 2020-07-31: 20 mg via ORAL
  Filled 2020-07-31: qty 1

## 2020-07-31 MED ORDER — FUROSEMIDE 10 MG/ML IJ SOLN
40.0000 mg | Freq: Once | INTRAMUSCULAR | Status: AC
  Administered 2020-07-31: 40 mg via INTRAVENOUS
  Filled 2020-07-31: qty 4

## 2020-07-31 MED ORDER — LORAZEPAM 2 MG/ML IJ SOLN
1.0000 mg | INTRAMUSCULAR | Status: AC | PRN
Start: 2020-07-31 — End: 2020-08-03
  Administered 2020-08-02: 2 mg via INTRAVENOUS
  Filled 2020-07-31 (×2): qty 1

## 2020-07-31 MED ORDER — FUROSEMIDE 10 MG/ML IJ SOLN: 40.0000 mg | Freq: Every day | INTRAMUSCULAR | Status: DC

## 2020-07-31 MED ORDER — ADULT MULTIVITAMIN W/MINERALS CH
1.0000 | ORAL_TABLET | Freq: Every day | ORAL | Status: DC
  Administered 2020-07-31 – 2020-08-04 (×5): 1 via ORAL
  Filled 2020-07-31 (×5): qty 1

## 2020-07-31 MED ORDER — THIAMINE HCL 100 MG PO TABS
100.0000 mg | ORAL_TABLET | Freq: Every day | ORAL | Status: DC
  Administered 2020-07-31 – 2020-08-04 (×5): 100 mg via ORAL
  Filled 2020-07-31 (×5): qty 1

## 2020-07-31 MED ORDER — METOPROLOL TARTRATE 5 MG/5ML IV SOLN
5.0000 mg | Freq: Once | INTRAVENOUS | Status: AC
  Administered 2020-07-31: 5 mg via INTRAVENOUS
  Filled 2020-07-31: qty 5

## 2020-07-31 MED ORDER — METOPROLOL SUCCINATE ER 25 MG PO TB24
25.0000 mg | ORAL_TABLET | Freq: Every day | ORAL | Status: DC
  Administered 2020-08-01: 25 mg via ORAL
  Filled 2020-07-31: qty 1

## 2020-07-31 MED ORDER — DILTIAZEM HCL-DEXTROSE 125-5 MG/125ML-% IV SOLN (PREMIX)
5.0000 mg/h | INTRAVENOUS | Status: DC
  Administered 2020-07-31: 5 mg/h via INTRAVENOUS
  Filled 2020-07-31: qty 125

## 2020-07-31 MED ORDER — LORAZEPAM 1 MG PO TABS
1.0000 mg | ORAL_TABLET | ORAL | Status: AC | PRN
  Administered 2020-08-03: 1 mg via ORAL
  Filled 2020-07-31: qty 1

## 2020-07-31 MED ORDER — DILTIAZEM LOAD VIA INFUSION
10.0000 mg | Freq: Once | INTRAVENOUS | Status: AC
  Administered 2020-07-31: 10 mg via INTRAVENOUS
  Filled 2020-07-31: qty 10

## 2020-07-31 MED ORDER — LOSARTAN POTASSIUM 25 MG PO TABS
25.0000 mg | ORAL_TABLET | Freq: Every day | ORAL | Status: DC
  Administered 2020-08-01 – 2020-08-02 (×2): 25 mg via ORAL
  Filled 2020-07-31 (×2): qty 1

## 2020-07-31 NOTE — Consult Note (Signed)
Cardiology Consult    Patient ID: OTT ESSE MRN: [id number], DOB/AGE: 11-23-1960   Admit date: 07/31/2020 Date of Consult: 07/31/2020  Primary Physician: Patient, No Pcp Per (Inactive) Primary Cardiologist: Donato Heinz, MD Requesting Provider: Pattricia Boss, MD  Patient Profile    [facility] is a 60 y.o. male with a history of nonischemic cardiomyopathy attributed to polysubstance use disorder, nonobstructive CAD, bipolar disorder, paroxysmal atrial flutter with prior TEE cardioversions, and other medical issues as detailed below who presents with shortness of breath and atrial flutter with rapid ventricular response.  History of Present Illness    [facility] is a 60 y.o. male with a history of nonischemic cardiomyopathy attributed to polysubstance use disorder, nonobstructive CAD, bipolar disorder, paroxysmal atrial flutter with prior TEE cardioversions, and other medical issues as detailed below who presents with shortness of breath and atrial flutter with rapid ventricular response.  We are consulted for a recurrent heart failure exacerbation and his arrhythmia.  He reports that for about the last week he's been having worsening shortness of breath, generalized fatigue, orthopnea, and PND.  These symptoms have all worsened acutely over the last 24 hours to the point at which he was quite uncomfortable even trying to lie in bed all day as he was too fatigued to do much else.  Additionally he has had very poor oral intake with nausea after most any oral intake and hasn't been drinking much fluid.  He's had multiple prior admissions for heart failure and arrhythmia.  He underwent TEE/DCCV last admission which was successful and has apparently held rhythm since that time.  He's been off of his medications for about 9 months (he only took them for the duration of the supply he had when he left the hospital.  This is largely related to medication costs - he has  medicare, not medicaid, and the eliquis and entresto were exorbitantly expensive for him so he just stopped everything but baby aspirin.  I explained to him that we would modify his regimen to less expensive medications and he promised adherence.  He does continue to drink daily, use occasional crack cocaine (last used yesterday), and smokes about a pack a day.  Cardiovascular regimen at the time of last discharge included Carvedilol 25 mg twice a day Entresto 24/26 twice daily Spironolactone 12.5 mg daily Eliquis 5 mg twice a day  Past Medical History   Past Medical History:  Diagnosis Date  . Anxiety   . Arthritis   . Bipolar 1 disorder ([place])   . Depression    Bipolar, not on meds/pt. stopped meds on own over 12 yrs ago  . GERD (gastroesophageal reflux disease)   . History of colon polyps   . Manic disorder ([place])   . Noncompliance 05/14/2019  . Substance abuse ([place])    72yrs ago smoke crack and marjuana  . Tobacco abuse 05/13/2012    Past Surgical History:  Procedure Laterality Date  . CARDIOVERSION N/A 05/06/2019   Procedure: CARDIOVERSION;  Surgeon: Acie Fredrickson Wonda Cheng, MD;  Location: [facility] ENDOSCOPY;  Service: Cardiovascular;  Laterality: N/A;  . CARDIOVERSION N/A 05/20/2019   Procedure: CARDIOVERSION;  Surgeon: Larey Dresser, MD;  Location: [facility] ENDOSCOPY;  Service: Cardiovascular;  Laterality: N/A;  . EXAMINATION UNDER ANESTHESIA N/A 11/12/2012   Procedure: EXAM UNDER ANESTHESIA;  Surgeon: Adin Hector, MD;  Location: [facility] ORS;  Service: General;  Laterality: N/A;  . EYE SURGERY     bilateral eye sx as a  child 6-29yrs old.  Marland Kitchen RIGHT/LEFT HEART CATH AND CORONARY ANGIOGRAPHY N/A 05/18/2019   Procedure: RIGHT/LEFT HEART CATH AND CORONARY ANGIOGRAPHY;  Surgeon: Larey Dresser, MD;  Location: [place] CV LAB;  Service: Cardiovascular;  Laterality: N/A;  . SPHINCTEROTOMY N/A 11/12/2012   Procedure: SPHINCTEROTOMY;  Surgeon: Adin Hector, MD;  Location: [facility] ORS;  Service: General;   Laterality: N/A;  . TEE WITHOUT CARDIOVERSION N/A 05/06/2019   Procedure: TRANSESOPHAGEAL ECHOCARDIOGRAM (TEE);  Surgeon: Acie Fredrickson Wonda Cheng, MD;  Location: [place];  Service: Cardiovascular;  Laterality: N/A;  . TEE WITHOUT CARDIOVERSION N/A 05/20/2019   Procedure: TRANSESOPHAGEAL ECHOCARDIOGRAM (TEE);  Surgeon: Larey Dresser, MD;  Location: [facility] ENDOSCOPY;  Service: Cardiovascular;  Laterality: N/A;  . TONSILLECTOMY       Allergies  Allergen Reactions  . Adhesive [Tape] Other (See Comments)    Heat and EKG pads flare pre-existing eczema  . Heparin Other (See Comments)    HIT ab +, SRA  negative   Inpatient Medications    . diltiazem  10 mg Intravenous Once  . furosemide  40 mg Intravenous Once    Family History    No family history on file. He indicated that his mother is deceased. He indicated that his father is deceased. He indicated that his sister is alive. He indicated that his brother is alive.   Social History    Social History   Socioeconomic History  . Marital status: Divorced    Spouse name: Not on file  . Number of children: Not on file  . Years of education: Not on file  . Highest education level: Not on file  Occupational History  . Not on file  Tobacco Use  . Smoking status: Current Every Day Smoker    Packs/day: 1.00    Years: 35.00    Pack years: 35.00    Types: Cigarettes  . Smokeless tobacco: Never Used  Substance and Sexual Activity  . Alcohol use: Yes    Alcohol/week: 4.0 standard drinks    Types: 4 Cans of beer per week    Comment: 4 cans of beer a day   . Drug use: Yes    Types: Cocaine, Marijuana    Comment: Quit x6 yrs ago-   . Sexual activity: Never  Other Topics Concern  . Not on file  Social History Narrative  . Not on file   Social Determinants of Health   Financial Resource Strain: Not on file  Food Insecurity: Not on file  Transportation Needs: No Transportation Needs  . Lack of Transportation (Medical): No  . Lack of  Transportation (Non-Medical): No  Physical Activity: Not on file  Stress: Not on file  Social Connections: Not on file  Intimate Partner Violence: Not on file     Review of Systems    General:  No chills, fever, night sweats or weight changes.  Cardiovascular:  No chest pain, dyspnea on exertion, edema, orthopnea, palpitations, paroxysmal nocturnal dyspnea. Dermatological: No rash, lesions/masses Respiratory: No cough, dyspnea Urologic: No hematuria, dysuria Abdominal:   No nausea, vomiting, diarrhea, bright red blood per rectum, melena, or hematemesis Neurologic:  No visual changes, wkns, changes in mental status. All other systems reviewed and are otherwise negative except as noted above.  Physical Exam    Blood pressure (!) 153/124, pulse (!) 134, resp. rate 16, SpO2 100 %.    No intake or output data in the 24 hours ending 07/31/20 2025 Wt Readings from Last 3 Encounters:  12/30/19  61.9 kg  09/06/19 61.2 kg  05/26/19 55.5 kg    CONSTITUTIONAL: alert and conversant, well-appearing, nourished, no distress HEENT: oropharynx clear and moist, no mucosal lesions, normal dentition, conjunctiva normal, EOM intact, pupils equal, no lid lag. NECK: supple, no cervical adenopathy, no thyromegaly CARDIOVASCULAR: Irregular tachycardic rhythm. No gallop, murmur, or rub. Normal S1/S2. Radial pulses intact. JVP flat. No carotid bruits. PULMONARY/CHEST WALL: no deformities, normal breath sounds bilaterally, normal work of breathing ABDOMINAL: soft, non-tender, non-distended EXTREMITIES: no edema or muscle atrophy, warm and well-perfused SKIN: Dry and intact without apparent rashes or wounds. NEUROLOGIC: alert, no abnormal movements, cranial nerves grossly intact.   Labs    Personally reviewed.  Significant findings include BNP 2100, troponin 50 once, GFR around 60, stable  Radiology Studies   Chest x-ray personally reviewed with small bilateral pleural effusions and some  cephalization consistent with congestive failure.  ECG & Cardiac Imaging    ECG personally reviewed with typical atrial flutter and left ventricular hypertrophy with repolarization abnormalities.  Coronary angiogram in March 2021 with isolated proximal RCA stenosis. He had no pulmonary hypertension with a low biventricular filling pressures and Fick index of 3.2 at that time.  TEE March 2021 with LVEF 25 to 30%, no ASD or PFO with a negative bubble study, trivial mitral regurgitation.  Transthoracic echo in October 2021 with persistently severely depressed LVEF 25 to 30% with global hypokinesis, mildly reduced RV function, otherwise unremarkable valves.   Assessment & Plan    60 year old male with a history of nonischemic cardiomyopathy due to polysubstance use disorder, recurrent polysubstance use, medication nonadherence, atrial flutter with 2 prior TEE cardioversions in our system since 2021 not adherent to [facility] presenting with decompensated acute on chronic systolic and diastolic heart failure in the setting of atrial flutter with 2-1 conduction.  Favor rhythm control given his cardiomyopathy and that he is profoundly symptomatic from this.  Rate control will be difficult with AV nodal blockers and, given stability, favor holding on these as I would hate for excessive AV nodal blockers to drive him into worsening heart failure exacerbation.  Please make n.p.o. at midnight for TEE cardioversion and start DOAC.  I emphasized the importance of uninterrupted anticoagulation at the very least during the period immediately after cardioversion.  If he can maintain adherence to his medical regimen and I think it would be prudent to offer him an ablation in the outpatient setting.  I reviewed the entirety of his prior ECGs in our system and cannot identify any atrial fibrillation.  He will need at least 4 weeks of uninterrupted anticoagulation post cardioversion and we can consider a noninvasive  monitor as an outpatient or, if he is able to be ablated, an implantable loop recorder at time of ablation to exclude underlying AF and liberate him from long-term anticoagulants.  I discussed with him that we would get him a cost effective regimen and he should adhere strictly to this; he is agreeable.  I also emphasized importance of trying to minimize and hopefully eliminate his substance use.  Problem list Acute on chronic systolic and diastolic heart failure Nonischemic cardiomyopathy, LVEF 25 to 30% Recurrent atrial flutter with 2-1 conduction not on anticoagulation Hypertension Nonobstructive coronary artery disease CKD 3 Medication nonadherence  Recommendations Acute on chronic systolic and diastolic heart failure Nonischemic cardiomyopathy, LVEF 25 to 30% - Start metoprolol succinate 25 mg daily - Start losartan 25 mg daily - If he tolerates the above then start spironolactone 12.5 mg daily -  Appears euvolemic would hold on further diuretics.  #Recurrent atrial flutter with 2-1 conduction not on anticoagulation - Heparin infusion - Warfarin per pharmacy - NPO for TEE/DCCV tomorrow - Favor ablation strategy if he can follow-up and adhere to medicine.  Hypertension - Optimize heart failure regimen as tolerated.  Nonobstructive coronary artery disease - Would prefer statin, but forego for now in favor of simplifed medical regimen and cost.  CKD 3 - Monitor renal function with RASSi reintroduction.  Medication nonadherence - Discussed 4 dollar list, medical regimen optimized for cost and benefit, and how essential adherence is to recovery  #Polysubstance use - As above.  Signed, Delight Hoh, MD 07/31/2020, 8:25 PM For questions or updates, please contact   Please consult [email or web address] for contact info under Cardiology/STEMI.

## 2020-07-31 NOTE — ED Provider Notes (Signed)
[facility]MENT Provider Note   CSN: [id number] Arrival date & time: 07/31/20  1719     History Chief Complaint  Patient presents with  . Chest Pain    [facility] is a 60 y.o. male.  HPI     60 year old man history of coronary artery disease, substance abuse, tobacco use presents today complaining of feeling generally weak with some difficulty breathing.  He reports that he has congestion that started on Friday.  He has had a nonproductive cough associated with this.  He has had runny nose.  He did endorses some mild chest tightness.  He has had some chills but no fever.  He is unsure of his COVID-vaccine status.  He thinks he might of gotten a shot in the hospital at 1 point in time.  He states that this feels like his congestive heart failure.  He has not been taking his medications for the past 7 months.  Past Medical History:  Diagnosis Date  . Anxiety   . Arthritis   . Bipolar 1 disorder ([place])   . Depression    Bipolar, not on meds/pt. stopped meds on own over 12 yrs ago  . GERD (gastroesophageal reflux disease)   . History of colon polyps   . Manic disorder ([place])   . Noncompliance 05/14/2019  . Substance abuse ([place])    96yrs ago smoke crack and marjuana  . Tobacco abuse 05/13/2012    Patient Active Problem List   Diagnosis Date Noted  . Acute on chronic heart failure ([place]) 12/24/2019  . Heparin induced thrombocytopenia ([place]) 05/25/2019  . Protein-calorie malnutrition, severe 05/20/2019  . Yeast UTI 05/15/2019  . Alcohol withdrawal ([place]) 05/14/2019  . Acute metabolic encephalopathy 40/98/1191  . AKI (acute kidney injury) ([place]) 05/14/2019  . Elevated d-dimer 05/14/2019  . Demand ischemia ([place]) 05/14/2019  . Elevated troponin 05/14/2019  . Pleural effusion 05/14/2019  . Polysubstance abuse ([place]) 05/14/2019  . E. coli UTI 05/14/2019  . Agitation 05/14/2019  . Noncompliance 05/14/2019  . Acute on chronic combined systolic and  diastolic CHF (congestive heart failure) ([place])   . Atrial flutter with rapid ventricular response ([place]) 05/04/2019  . Acute heart failure ([place])   . Essential hypertension   . Herpes zoster without complication 47/82/9562  . History of fall 07/23/2017  . Prostatitis 06/23/2014  . Serrated adenoma of colon 05/13/2012  . Anal fissure 05/13/2012  . Tobacco abuse 05/13/2012  . 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 Chronic shoulder pain 01/03/2012  . Neck pain, chronic 01/03/2012  . Mood disorder ([place]) 01/03/2012  . Depression with anxiety 01/03/2012    Past Surgical History:  Procedure Laterality Date  . CARDIOVERSION N/A 05/06/2019   Procedure: CARDIOVERSION;  Surgeon: Acie Fredrickson Wonda Cheng, MD;  Location: [facility] ENDOSCOPY;  Service: Cardiovascular;  Laterality: N/A;  . CARDIOVERSION N/A 05/20/2019   Procedure: CARDIOVERSION;  Surgeon: Larey Dresser, MD;  Location: [facility] ENDOSCOPY;  Service: Cardiovascular;  Laterality: N/A;  . EXAMINATION UNDER ANESTHESIA N/A 11/12/2012   Procedure: EXAM UNDER ANESTHESIA;  Surgeon: Adin Hector, MD;  Location: [facility] ORS;  Service: General;  Laterality: N/A;  . EYE SURGERY     bilateral eye sx as a child 6-79yrs old.  Marland Kitchen RIGHT/LEFT HEART CATH AND CORONARY ANGIOGRAPHY N/A 05/18/2019   Procedure: RIGHT/LEFT HEART CATH AND CORONARY ANGIOGRAPHY;  Surgeon: Larey Dresser, MD;  Location: [place] CV LAB;  Service: Cardiovascular;  Laterality: N/A;  . SPHINCTEROTOMY N/A 11/12/2012  Procedure: SPHINCTEROTOMY;  Surgeon: Adin Hector, MD;  Location: [facility] ORS;  Service: General;  Laterality: N/A;  . TEE WITHOUT CARDIOVERSION N/A 05/06/2019   Procedure: TRANSESOPHAGEAL ECHOCARDIOGRAM (TEE);  Surgeon: Acie Fredrickson Wonda Cheng, MD;  Location: [place];  Service: Cardiovascular;  Laterality: N/A;  . TEE WITHOUT CARDIOVERSION N/A 05/20/2019   Procedure: TRANSESOPHAGEAL ECHOCARDIOGRAM (TEE);  Surgeon: Larey Dresser, MD;  Location: [facility] ENDOSCOPY;  Service: Cardiovascular;   Laterality: N/A;  . TONSILLECTOMY         No family history on file.  Social History   Tobacco Use  . Smoking status: Current Every Day Smoker    Packs/day: 1.00    Years: 35.00    Pack years: 35.00    Types: Cigarettes  . Smokeless tobacco: Never Used  Substance Use Topics  . Alcohol use: Yes    Alcohol/week: 4.0 standard drinks    Types: 4 Cans of beer per week    Comment: 4 cans of beer a day   . Drug use: Yes    Types: Cocaine, Marijuana    Comment: Quit x6 yrs ago-     Home Medications Prior to Admission medications   Medication Sig Start Date End Date Taking? Authorizing Provider  apixaban (ELIQUIS) 5 MG TABS tablet TAKE 1 TABLET (5 MG TOTAL) BY MOUTH TWO TIMES DAILY. 12/30/19 12/29/20  Virl Axe, MD  carvedilol (COREG) 25 MG tablet Take 1 tablet (25 mg total) by mouth 2 (two) times daily with a meal. 12/30/19   Virl Axe, MD  cefdinir (OMNICEF) 300 MG capsule Take 1 capsule (300 mg total) by mouth 2 (two) times daily. 12/31/19   Virl Axe, MD  cholecalciferol (VITAMIN D3) 25 MCG (1000 UNIT) tablet Take 2,000 Units by mouth daily.    [provider]  feeding supplement, ENSURE ENLIVE, (ENSURE ENLIVE) LIQD Take 237 mLs by mouth in the morning, at noon, in the evening, and at bedtime. 05/25/19   Charlynne Cousins, MD  ferrous sulfate 324 MG TBEC Take 324 mg by mouth daily with breakfast.    [provider]  furosemide (LASIX) 20 MG tablet Take 1 tablet (20 mg total) by mouth daily. 12/30/19   Virl Axe, MD  lactulose (CHRONULAC) 10 GM/15ML solution Take 30 mLs (20 g total) by mouth 2 (two) times daily. 05/25/19   Charlynne Cousins, MD  Multiple Vitamin (MULTIVITAMIN WITH MINERALS) TABS tablet Take 1 tablet by mouth daily. 05/25/19   Charlynne Cousins, MD  sacubitril-valsartan (ENTRESTO) 24-26 MG Take 1 tablet by mouth 2 (two) times daily. 12/30/19   Virl Axe, MD  spironolactone (ALDACTONE) 25 MG tablet Take 1 tablet (25 mg  total) by mouth daily. 12/30/19   Virl Axe, MD    Allergies    Adhesive [tape] and Heparin  Review of Systems   Review of Systems  All other systems reviewed and are negative.   Physical Exam Updated Vital Signs There were no vitals taken for this visit.  Physical Exam Vitals and nursing note reviewed.  Constitutional:      General: He is not in acute distress.    Appearance: He is not ill-appearing.  HENT:     Head: Normocephalic.  Eyes:     Pupils: Pupils are equal, round, and reactive to light.  Cardiovascular:     Rate and Rhythm: Regular rhythm. Tachycardia present.     Heart sounds: Normal heart sounds.  Pulmonary:     Effort: Pulmonary effort is normal. No respiratory distress.  Breath sounds: Examination of the right-lower field reveals decreased breath sounds. Examination of the left-lower field reveals decreased breath sounds. Decreased breath sounds present. No wheezing or rhonchi.  Abdominal:     General: Bowel sounds are normal.     Palpations: Abdomen is soft.  Musculoskeletal:        General: Normal range of motion.     Cervical back: Normal range of motion.  Skin:    General: Skin is dry.     Capillary Refill: Capillary refill takes less than 2 seconds.  Neurological:     General: No focal deficit present.     Mental Status: He is alert.  Psychiatric:        Mood and Affect: Mood normal.     ED Results / Procedures / Treatments   Labs (all labs ordered are listed, but only abnormal results are displayed) Labs Reviewed  BASIC METABOLIC PANEL  CBC  TROPONIN I (HIGH SENSITIVITY)    EKG EKG Interpretation  Date/Time:  Monday Jul 31 2020 17:33:13 EDT Ventricular Rate:  135 PR Interval:  121 QRS Duration: 83 QT Interval:  316 QTC Calculation: 474 R Axis:   -19 Text Interpretation: Sinus tachycardia LVH with secondary repolarization abnormality Anterior Q waves, possibly due to LVH ST depression, consider ischemia, diffuse lds  Confirmed by Pattricia Boss [phone]) on 07/31/2020 5:41:37 PM   Radiology No results found.  Procedures .Critical Care Performed by: Pattricia Boss, MD Authorized by: Pattricia Boss, MD   Critical care provider statement:    Critical care time (minutes):  45   Critical care end time:  07/31/2020 9:12 PM   Critical care was time spent personally by me on the following activities:  Discussions with consultants, evaluation of patient's response to treatment, examination of patient, ordering and performing treatments and interventions, ordering and review of laboratory studies, ordering and review of radiographic studies, pulse oximetry, re-evaluation of patient's condition, obtaining history from patient or surrogate and review of old charts     Medications Ordered in ED Medications - No data to display  ED Course  I have reviewed the triage vital signs and the nursing notes.  Pertinent labs & imaging results that were available during my care of the patient were reviewed by me and considered in my medical decision making (see chart for details).    MDM Rules/Calculators/A&P                          60 yo male presents with medication noncompliance for months, now in atrial flutter with rate in 130s.  Planned iv cardizem, but rate has decreased to 113 after lopressor and fluids.  IV lasix given. Discussed with [facility] and will see in consult Discussed with Dr. Flossie Buffy and she will see for admission  1atrial flutter- rate decreasing here with meds - will need to restart anticoagulants and rate control meds. 2- chf and pulmonary effusions- likely secondary to #1 3-elevated troponin- appears chronic but may have some demand ischemia 4- anemia- stable from 12/28/19 5- chronic renal insufficiency    Final Clinical Impression(s) / ED Diagnoses Final diagnoses:  Atrial flutter, unspecified type [facility])    Rx / DC Orders ED Discharge Orders    None       Pattricia Boss, MD 07/31/20 2112

## 2020-07-31 NOTE — ED Triage Notes (Signed)
Pt BIB [facility] for chest pain starting Friday morning and gradually getting worse. EMS gave nitro and pt took aspirin at home with some relief. Pt unable to adhere to medicine schedule and hasn't taken meds in "months".

## 2020-07-31 NOTE — H&P (Signed)
History and Physical    Austin Cobb LKG:[id number] DOB: February 26, 1961 DOA: 07/31/2020  PCP: Patient, No Pcp Per (Inactive)  Patient coming from: Home  I have personally briefly reviewed patient's old medical records in [place]  Chief Complaint: chest tightness, fatigue  HPI: Austin Cobb is a 60 y.o. male with medical history significant for combined systolic and diastolic heart failure, hypertension, atrial flutter on Eliquis and history of heparin-induced thrombocytopenia who presents with concerns of chest tightness, increasing fatigue since 4 days ago.   He has not been taking any of his medication for at least 9 months due to financial issues.  He states that Eliquis and Entresto cost him about $600 a month and he cannot afford that.  He also did not take any of his other medications since he figures they will not help much if he cannot afford the other 2 medications.  About 4 days ago he woke up with increasing chest tightness, increasing fatigue and shortness of breath with exertion. He endorsed alcohol use of about 42 ounce of beer daily.  Also used cocaine last night.  Also smokes about half a pack per day.  In the ED, he was found to be in atrial flutter 3-1 conduction with rates up to 130s.  Blood pressure elevated systolic 664Q over 034.  BNP > 2000.  Chest x-ray shows mild bilateral pleural effusion.  He was given 5 mg of Lopressor and started on diltiazem drip.  Cardiology has been notified by ED physician Dr. Jeanell Sparrow and will see in consultation tonight.   Review of Systems:  Constitutional: No Weight Change, No Fever ENT/Mouth: No sore throat, No Rhinorrhea Eyes: No Eye Pain, No Vision Changes Cardiovascular: + Chest Pain, no SOB, No PND, No Dyspnea on Exertion, + Orthopnea, No Edema, + Palpitations Respiratory: No Cough, No Sputum, No Wheezing, no Dyspnea  Gastrointestinal: No Nausea, No Vomiting, No Diarrhea, No Constipation, No Pain Genitourinary: no Urinary  Incontinence, No Urgency, No Flank Pain Musculoskeletal: No Arthralgias, No Myalgias Skin: No Skin Lesions, No Pruritus, Neuro: no Weakness, No Numbness Psych: No Anxiety/Panic, No Depression, no decrease appetite Heme/Lymph: No Bruising, No Bleeding  Past Medical History:  Diagnosis Date  . Anxiety   . Arthritis   . Bipolar 1 disorder ([place])   . Depression    Bipolar, not on meds/pt. stopped meds on own over 12 yrs ago  . GERD (gastroesophageal reflux disease)   . History of colon polyps   . Manic disorder ([place])   . Noncompliance 05/14/2019  . Substance abuse ([place])    52yrs ago smoke crack and marjuana  . Tobacco abuse 05/13/2012    Past Surgical History:  Procedure Laterality Date  . CARDIOVERSION N/A 05/06/2019   Procedure: CARDIOVERSION;  Surgeon: Acie Fredrickson Wonda Cheng, MD;  Location: [facility] ENDOSCOPY;  Service: Cardiovascular;  Laterality: N/A;  . CARDIOVERSION N/A 05/20/2019   Procedure: CARDIOVERSION;  Surgeon: Larey Dresser, MD;  Location: [facility] ENDOSCOPY;  Service: Cardiovascular;  Laterality: N/A;  . EXAMINATION UNDER ANESTHESIA N/A 11/12/2012   Procedure: EXAM UNDER ANESTHESIA;  Surgeon: Adin Hector, MD;  Location: [facility] ORS;  Service: General;  Laterality: N/A;  . EYE SURGERY     bilateral eye sx as a child 6-82yrs old.  Marland Kitchen RIGHT/LEFT HEART CATH AND CORONARY ANGIOGRAPHY N/A 05/18/2019   Procedure: RIGHT/LEFT HEART CATH AND CORONARY ANGIOGRAPHY;  Surgeon: Larey Dresser, MD;  Location: [place] CV LAB;  Service: Cardiovascular;  Laterality: N/A;  . SPHINCTEROTOMY  N/A 11/12/2012   Procedure: SPHINCTEROTOMY;  Surgeon: Adin Hector, MD;  Location: [facility] ORS;  Service: General;  Laterality: N/A;  . TEE WITHOUT CARDIOVERSION N/A 05/06/2019   Procedure: TRANSESOPHAGEAL ECHOCARDIOGRAM (TEE);  Surgeon: Acie Fredrickson Wonda Cheng, MD;  Location: [place];  Service: Cardiovascular;  Laterality: N/A;  . TEE WITHOUT CARDIOVERSION N/A 05/20/2019   Procedure: TRANSESOPHAGEAL ECHOCARDIOGRAM (TEE);  Surgeon:  Larey Dresser, MD;  Location: [facility] ENDOSCOPY;  Service: Cardiovascular;  Laterality: N/A;  . TONSILLECTOMY       reports that he has been smoking cigarettes. He has a 35.00 pack-year smoking history. He has never used smokeless tobacco. He reports current alcohol use of about 4.0 standard drinks of alcohol per week. He reports current drug use. Drugs: Cocaine and Marijuana. Social History  Allergies  Allergen Reactions  . Adhesive [Tape] Other (See Comments)    Heat and EKG pads flare pre-existing eczema  . Heparin Other (See Comments)    HIT ab +, SRA  negative    No family history on file.   Prior to Admission medications   Medication Sig Start Date End Date Taking? Authorizing Provider  apixaban (ELIQUIS) 5 MG TABS tablet TAKE 1 TABLET (5 MG TOTAL) BY MOUTH TWO TIMES DAILY. Patient not taking: Reported on 07/31/2020 12/30/19 12/29/20  Virl Axe, MD  carvedilol (COREG) 25 MG tablet Take 1 tablet (25 mg total) by mouth 2 (two) times daily with a meal. Patient not taking: Reported on 07/31/2020 12/30/19   Virl Axe, MD  cholecalciferol (VITAMIN D3) 25 MCG (1000 UNIT) tablet Take 2,000 Units by mouth daily. Patient not taking: Reported on 07/31/2020    [provider]  feeding supplement, ENSURE ENLIVE, (ENSURE ENLIVE) LIQD Take 237 mLs by mouth in the morning, at noon, in the evening, and at bedtime. Patient not taking: Reported on 07/31/2020 05/25/19   Charlynne Cousins, MD  ferrous sulfate 324 MG TBEC Take 324 mg by mouth daily with breakfast. Patient not taking: Reported on 07/31/2020    [provider]  furosemide (LASIX) 20 MG tablet Take 1 tablet (20 mg total) by mouth daily. Patient not taking: Reported on 07/31/2020 12/30/19   Virl Axe, MD  lactulose (CHRONULAC) 10 GM/15ML solution Take 30 mLs (20 g total) by mouth 2 (two) times daily. Patient not taking: Reported on 07/31/2020 05/25/19   Charlynne Cousins, MD  Multiple Vitamin (MULTIVITAMIN  WITH MINERALS) TABS tablet Take 1 tablet by mouth daily. Patient not taking: Reported on 07/31/2020 05/25/19   Charlynne Cousins, MD  sacubitril-valsartan (ENTRESTO) 24-26 MG Take 1 tablet by mouth 2 (two) times daily. Patient not taking: Reported on 07/31/2020 12/30/19   Virl Axe, MD  spironolactone (ALDACTONE) 25 MG tablet Take 1 tablet (25 mg total) by mouth daily. Patient not taking: Reported on 07/31/2020 12/30/19   Virl Axe, MD    Physical Exam: Vitals:   07/31/20 1800 07/31/20 1854 07/31/20 1900 07/31/20 2015  BP: (!) 158/127 (!) 165/123 (!) 153/124 (!) 147/118  Pulse: (!) 134 (!) 134 (!) 134 (!) 32  Resp: 20 (!) 25 16 (!) 27  SpO2: 100% 100% 100% 93%    Constitutional: NAD, calm, comfortable, thin elderly male sitting upright in bed eating crackers splitting some crackles as he spoke Vitals:   07/31/20 1800 07/31/20 1854 07/31/20 1900 07/31/20 2015  BP: (!) 158/127 (!) 165/123 (!) 153/124 (!) 147/118  Pulse: (!) 134 (!) 134 (!) 134 (!) 32  Resp: 20 (!) 25 16 (!) 27  SpO2: 100% 100% 100% 93%   Eyes: PERRL, lids and conjunctivae normal ENMT: Mucous membranes are moist.  Poor dentition.   Neck: normal, supple Respiratory: clear to auscultation bilaterally, no wheezing, no crackles. Normal respiratory effort. No accessory muscle use.  Cardiovascular: Irregular rhythm and tachycardia, no murmurs / rubs / gallops. No extremity edema. 2+ pedal pulses.  Abdomen: no tenderness, no masses palpated. Bowel sounds positive.  Musculoskeletal: no clubbing / cyanosis. No joint deformity upper and lower extremities. Good ROM, no contractures. Normal muscle tone.  Skin: no rashes, lesions, ulcers. No induration Neurologic: CN 2-12 grossly intact. Sensation intact,Strength 5/5 in all 4.  Psychiatric: Normal judgment and insight. Alert and oriented x 3. Normal mood.    Labs on Admission: I have personally reviewed following labs and imaging studies  CBC: Recent Labs  Lab  07/31/20 1742  WBC 7.2  HGB 11.5*  HCT 35.2*  MCV 94.6  PLT [id number]   Basic Metabolic Panel: Recent Labs  Lab 07/31/20 1742  NA 136  K 4.6  CL 109  CO2 19*  GLUCOSE 149*  BUN 20  CREATININE 1.27*  CALCIUM 8.6*   GFR: CrCl cannot be calculated (Unknown ideal weight.). Liver Function Tests: No results for input(s): AST, ALT, ALKPHOS, BILITOT, PROT, ALBUMIN in the last 168 hours. No results for input(s): LIPASE, AMYLASE in the last 168 hours. No results for input(s): AMMONIA in the last 168 hours. Coagulation Profile: No results for input(s): INR, PROTIME in the last 168 hours. Cardiac Enzymes: No results for input(s): CKTOTAL, CKMB, CKMBINDEX, TROPONINI in the last 168 hours. BNP (last 3 results) No results for input(s): PROBNP in the last 8760 hours. HbA1C: No results for input(s): HGBA1C in the last 72 hours. CBG: No results for input(s): GLUCAP in the last 168 hours. Lipid Profile: No results for input(s): CHOL, HDL, LDLCALC, TRIG, CHOLHDL, LDLDIRECT in the last 72 hours. Thyroid Function Tests: No results for input(s): TSH, T4TOTAL, FREET4, T3FREE, THYROIDAB in the last 72 hours. Anemia Panel: No results for input(s): VITAMINB12, FOLATE, FERRITIN, TIBC, IRON, RETICCTPCT in the last 72 hours. Urine analysis:    Component Value Date/Time   COLORURINE AMBER (A) 12/28/2019 1656   APPEARANCEUR CLOUDY (A) 12/28/2019 1656   LABSPEC 1.016 12/28/2019 1656   PHURINE 8.0 12/28/2019 1656   GLUCOSEU NEGATIVE 12/28/2019 1656   HGBUR NEGATIVE 12/28/2019 1656   BILIRUBINUR NEGATIVE 12/28/2019 1656   KETONESUR NEGATIVE 12/28/2019 1656   PROTEINUR NEGATIVE 12/28/2019 1656   NITRITE POSITIVE (A) 12/28/2019 1656   LEUKOCYTESUR LARGE (A) 12/28/2019 1656    Radiological Exams on Admission: DG Chest 2 View  Result Date: 07/31/2020 CLINICAL DATA:  60 year old male with chest pain. EXAM: CHEST - 2 VIEW COMPARISON:  Chest radiograph dated 12/28/2019. FINDINGS: There is stable mild  cardiomegaly. Small bilateral pleural effusions, slightly increased since the prior radiograph. No focal consolidation or pneumothorax. Degenerative changes of the spine. No acute osseous pathology. IMPRESSION: Small bilateral pleural effusions, increased since the prior radiograph. Electronically Signed   By: Anner Crete M.D.   On: 07/31/2020 18:35      Assessment/Plan  60 yo with HF and atrial flutter who presented with atrial flutter and CHF exacerbation due to being off of medication for cost reasons for at least 9 months.  Atrial flutter -w/ 3-1 conduction  -initially started on diltiazem in the ED but will discontinue due to EF of 25% in Oct 2021 echo and current worsening CHF  -metoprolol succinate 25mg  daily  -lovenox  bridge to warfarin per pharmacy -NPO for TEE/DCCV   Acute on chronic CHF exacerbation -Last echocardiogram on 12/2019 with EF 25 to 30% -Repeat echocardiogram -Strict I's and O's -Daily weights -Daily IV 40 mg Lasix  Elevated troponin - Trop of 50 -likely demand ischemia - follow trend   Polysubstance abuse -Patient was using 42 ounce of beer daily and cocaine last night -Check UDS -CIWA protocol  HTN -uncontrolled  -losartan 25mg    Medical non-compliant  -pt unable to afford medications. consult TOCM for assistance   DVT prophylaxis:SCD due to history of HIT Code Status: Full Family Communication: Plan discussed with patient at bedside  disposition Plan: Home with at least 2 midnight stays  Consults called:  Admission status: inpatient  Level of care: Progressive  Status is: Inpatient  Remains inpatient appropriate because:Inpatient level of care appropriate due to severity of illness   Dispo: The patient is from: Home              Anticipated d/c is to: Home              Patient currently is not medically stable to d/c.   Difficult to place patient No         Orene Desanctis DO [facility]   If 7PM-7AM, please contact  night-coverage [email or web address]   07/31/2020, 9:21 PM

## 2020-07-31 NOTE — ED Notes (Signed)
Patient transported to X-ray 

## 2020-08-01 ENCOUNTER — Other Ambulatory Visit (HOSPITAL_COMMUNITY): Payer: Medicare (Managed Care)

## 2020-08-01 ENCOUNTER — Inpatient Hospital Stay (HOSPITAL_COMMUNITY): Payer: Medicare (Managed Care)

## 2020-08-01 ENCOUNTER — Other Ambulatory Visit (HOSPITAL_COMMUNITY): Payer: Self-pay

## 2020-08-01 DIAGNOSIS — I10 Essential (primary) hypertension: Secondary | ICD-10-CM | POA: Diagnosis not present

## 2020-08-01 DIAGNOSIS — F1910 Other psychoactive substance abuse, uncomplicated: Secondary | ICD-10-CM | POA: Diagnosis not present

## 2020-08-01 DIAGNOSIS — I4892 Unspecified atrial flutter: Secondary | ICD-10-CM

## 2020-08-01 DIAGNOSIS — I5042 Chronic combined systolic (congestive) and diastolic (congestive) heart failure: Secondary | ICD-10-CM | POA: Diagnosis not present

## 2020-08-01 DIAGNOSIS — I5043 Acute on chronic combined systolic (congestive) and diastolic (congestive) heart failure: Secondary | ICD-10-CM

## 2020-08-01 LAB — PROTIME-INR
INR: 3.7 — ABNORMAL HIGH (ref 0.8–1.2)
INR: 3.8 — ABNORMAL HIGH (ref 0.8–1.2)
Prothrombin Time: 36.3 seconds — ABNORMAL HIGH (ref 11.4–15.2)
Prothrombin Time: 37.2 seconds — ABNORMAL HIGH (ref 11.4–15.2)

## 2020-08-01 LAB — CBC
HCT: 40.2 % (ref 39.0–52.0)
Hemoglobin: 13.2 g/dL (ref 13.0–17.0)
MCH: 31.1 pg (ref 26.0–34.0)
MCHC: 32.8 g/dL (ref 30.0–36.0)
MCV: 94.8 fL (ref 80.0–100.0)
Platelets: 187 10*3/uL (ref 150–400)
RBC: 4.24 MIL/uL (ref 4.22–5.81)
RDW: 14 % (ref 11.5–15.5)
WBC: 7.3 10*3/uL (ref 4.0–10.5)
nRBC: 0 % (ref 0.0–0.2)

## 2020-08-01 LAB — ECHOCARDIOGRAM LIMITED
Height: 70 in
S' Lateral: 4.7 cm
Weight: 2088.2 oz

## 2020-08-01 LAB — BASIC METABOLIC PANEL
Anion gap: 10 (ref 5–15)
BUN: 22 mg/dL (ref 8–23)
CO2: 25 mmol/L (ref 22–32)
Calcium: 9 mg/dL (ref 8.9–10.3)
Chloride: 105 mmol/L (ref 98–111)
Creatinine, Ser: 1.36 mg/dL — ABNORMAL HIGH (ref 0.61–1.24)
GFR, Estimated: 59 mL/min — ABNORMAL LOW (ref >60)
Glucose, Bld: 85 mg/dL (ref 70–99)
Potassium: 4 mmol/L (ref 3.5–5.1)
Sodium: 140 mmol/L (ref 135–145)

## 2020-08-01 LAB — COMPREHENSIVE METABOLIC PANEL
ALT: 13 U/L (ref 0–44)
AST: 20 U/L (ref 15–41)
Albumin: 3.4 g/dL — ABNORMAL LOW (ref 3.5–5.0)
Alkaline Phosphatase: 76 U/L (ref 38–126)
Anion gap: 10 (ref 5–15)
BUN: 22 mg/dL (ref 8–23)
CO2: 24 mmol/L (ref 22–32)
Calcium: 8.8 mg/dL — ABNORMAL LOW (ref 8.9–10.3)
Chloride: 106 mmol/L (ref 98–111)
Creatinine, Ser: 1.43 mg/dL — ABNORMAL HIGH (ref 0.61–1.24)
GFR, Estimated: 56 mL/min — ABNORMAL LOW (ref >60)
Glucose, Bld: 94 mg/dL (ref 70–99)
Potassium: 3.7 mmol/L (ref 3.5–5.1)
Sodium: 140 mmol/L (ref 135–145)
Total Bilirubin: 1 mg/dL (ref 0.3–1.2)
Total Protein: 6.4 g/dL — ABNORMAL LOW (ref 6.5–8.1)

## 2020-08-01 LAB — LACTIC ACID, PLASMA: Lactic Acid, Venous: 2.3 mmol/L (ref 0.5–1.9)

## 2020-08-01 LAB — TSH: TSH: 3.816 u[IU]/mL (ref 0.350–4.500)

## 2020-08-01 MED ORDER — CARVEDILOL 6.25 MG PO TABS
6.2500 mg | ORAL_TABLET | Freq: Two times a day (BID) | ORAL | Status: DC
  Administered 2020-08-01: 6.25 mg via ORAL
  Filled 2020-08-01 (×2): qty 1

## 2020-08-01 MED ORDER — WARFARIN - PHARMACIST DOSING INPATIENT: Freq: Every day | Status: DC

## 2020-08-01 MED ORDER — RIVAROXABAN 15 MG PO TABS
15.0000 mg | ORAL_TABLET | Freq: Every day | ORAL | Status: DC
  Administered 2020-08-01 – 2020-08-03 (×3): 15 mg via ORAL
  Filled 2020-08-01 (×3): qty 1

## 2020-08-01 MED ORDER — SPIRONOLACTONE 25 MG PO TABS
25.0000 mg | ORAL_TABLET | Freq: Every day | ORAL | Status: DC
  Administered 2020-08-01 – 2020-08-04 (×4): 25 mg via ORAL
  Filled 2020-08-01 (×4): qty 1

## 2020-08-01 MED ORDER — ENOXAPARIN SODIUM 60 MG/0.6ML IJ SOSY
60.0000 mg | PREFILLED_SYRINGE | Freq: Two times a day (BID) | INTRAMUSCULAR | Status: DC
  Administered 2020-08-01: 60 mg via SUBCUTANEOUS
  Filled 2020-08-01 (×2): qty 0.6

## 2020-08-01 NOTE — Progress Notes (Signed)
Admitted from [facility] ED accompanied by RN,alert and oriented, oriented to the room and staff,CHG done. V/S checked.attached to continous cardiac monitoring,CCMD notified.Will continue to monitor.

## 2020-08-01 NOTE — Progress Notes (Signed)
Progress Note  Patient Name: Austin Cobb Date of Encounter: 08/01/2020  [facility] Cardiologist: Donato Heinz, MD   Subjective   Hypertensive to 172/130 this morning, pulse in 130s.  Denies any chest pain or dyspnea currently  Inpatient Medications    Scheduled Meds: . enoxaparin (LOVENOX) injection  60 mg Subcutaneous Q12H  . folic acid  1 mg Oral Daily  . losartan  25 mg Oral Daily  . metoprolol succinate  25 mg Oral Daily  . multivitamin with minerals  1 tablet Oral Daily  . thiamine  100 mg Oral Daily   Or  . thiamine  100 mg Intravenous Daily  . Warfarin - Pharmacist Dosing Inpatient   Does not apply q1600   Continuous Infusions:  PRN Meds: LORazepam **OR** LORazepam   Vital Signs    Vitals:   08/01/20 0656 08/01/20 0700 08/01/20 0742 08/01/20 0743  BP:    (!) 172/130  Pulse:   (!) 135 (!) 134  Resp: 20  19   Temp: 98.5 F (36.9 C)   97.9 F (36.6 C)  TempSrc: Oral   Oral  SpO2: 98%   100%  Weight:  59.2 kg    Height:  5\' 10"  (1.778 m)      Intake/Output Summary (Last 24 hours) at 08/01/2020 0804 Last data filed at 08/01/2020 0241 Gross per 24 hour  Intake 514.21 ml  Output 1700 ml  Net -1185.79 ml   Last 3 Weights 08/01/2020 12/30/2019 12/29/2019  Weight (lbs) 130 lb 8.2 oz 136 lb 7.4 oz 136 lb 1.6 oz  Weight (kg) 59.2 kg 61.9 kg 61.735 kg      Telemetry    AFL in 130s- Personally Reviewed  ECG    No new ECG- Personally Reviewed  Physical Exam   GEN: No acute distress.   Neck: No JVD Cardiac: tachycardic, regular no murmurs Respiratory: Clear to auscultation bilaterally. GI: Soft, nontender, non-distended  MS: No edema Neuro:  Nonfocal  Psych: Normal affect   Labs    High Sensitivity Troponin:   Recent Labs  Lab 07/31/20 1742 07/31/20 1949  TROPONINIHS 50* 49*      Chemistry Recent Labs  Lab 07/31/20 1742 08/01/20 0442  NA 136 140  K 4.6 4.0  CL 109 105  CO2 19* 25  GLUCOSE 149* 85  BUN 20 22   CREATININE 1.27* 1.36*  CALCIUM 8.6* 9.0  GFRNONAA >60 59*  ANIONGAP 8 10     Hematology Recent Labs  Lab 07/31/20 1742 08/01/20 0442  WBC 7.2 7.3  RBC 3.72* 4.24  HGB 11.5* 13.2  HCT 35.2* 40.2  MCV 94.6 94.8  MCH 30.9 31.1  MCHC 32.7 32.8  RDW 13.8 14.0  PLT 168 187    BNP Recent Labs  Lab 07/31/20 1804  BNP 2,128.0*     DDimer No results for input(s): DDIMER in the last 168 hours.   Radiology    DG Chest 2 View  Result Date: 07/31/2020 CLINICAL DATA:  59 year old male with chest pain. EXAM: CHEST - 2 VIEW COMPARISON:  Chest radiograph dated 12/28/2019. FINDINGS: There is stable mild cardiomegaly. Small bilateral pleural effusions, slightly increased since the prior radiograph. No focal consolidation or pneumothorax. Degenerative changes of the spine. No acute osseous pathology. IMPRESSION: Small bilateral pleural effusions, increased since the prior radiograph. Electronically Signed   By: Anner Crete M.D.   On: 07/31/2020 18:35    Cardiac Studies     Patient Profile  60 y.o. male with a history of nonischemic cardiomyopathy attributed to polysubstance use disorder, nonobstructive CAD, bipolar disorder, paroxysmal atrial flutter who presents with shortness of breath and atrial flutter with rapid ventricular response  Assessment & Plan    Acute on chronic systolic and diastolic heart failure: Nonischemic cardiomyopathy, LVEF 25 to 30%.  Cath on 05/18/19 showed nonobstructive CAD.  Suspect NICM due to polysubstance abuse vs tachycardia induced cardiomyopathy. - Started metoprolol succinate 25 mg daily.  Would favor switching to carvedilol to avoid nonselective BB in patient actively using cocaine - Started losartan 25 mg daily - Start spironolactone 25 mg daily - Echo - Check lactate - Does not appear volume overloaded on exam.  Hold off on further diuresis for now  Recurrent atrial flutter with 2-1 conduction not on anticoagulation - Discussed  options with pharmacy.  Was given xarelto last night but then also lovenox and warfarin ordered.  I do not think warfarin will be a good option for him.  When I brought up going to appointments for INR checks, he said he would not do that.  Recommend xarelto and trying to get patient assistance.  Discussed with pharmacy - Hold off on TEE/DCCV for now, as he is not a good candidate due to noncompliance.  Will rate control for now  Hypertension: markedly hypertensive this morning, but just given morning meds with losartan/toprol XL.  We will switch from Toprol-XL to carvedilol as above.  Add spironolactone as above  Nonobstructive coronary artery disease - Would prefer statin, but forego for now in favor of simplifed medical regimen and cost.  CKD 3 - Monitor renal function with RASSi reintroduction.  Medication nonadherence - Discussed 4 dollar list, medical regimen optimized for cost and benefit, and how essential adherence is to recovery  Polysubstance use: UDS positive for cocaine  For questions or updates, please contact [place] Please consult [email or web address] for contact info under        Signed, Donato Heinz, MD  08/01/2020, 8:04 AM

## 2020-08-01 NOTE — Progress Notes (Signed)
PROGRESS NOTE    Austin Cobb  XLK:[id number] DOB: 1961-02-05 DOA: 07/31/2020 PCP: Patient, No Pcp Per (Inactive)   Brief Narrative:  Austin Cobb is a 60 y.o. male with medical history significant for combined systolic and diastolic heart failure, hypertension, atrial flutter on Eliquis and history of heparin-induced thrombocytopenia who presents with concerns of chest tightness, increasing fatigue since 4 days ago. He has not been taking any of his medication for at least 9 months due to financial issues. 4 days ago he woke up with increasing chest tightness, increasing fatigue and shortness of breath with exertion. He endorsed alcohol use of about 42 ounce of beer daily, occasional cocaine use - mostly recently last night, and smokes half a pack per day.  In the ED, he was found to be in atrial flutter 3-1 conduction with rates up to 130s.  Blood pressure elevated systolic 366Y over 403.  BNP > 2000.  Chest x-ray shows mild bilateral pleural effusion.  He was given 5 mg of Lopressor and started on diltiazem drip.  Assessment & Plan:   Principal Problem:   Atrial flutter ([place]) Active Problems:   Essential hypertension   Acute on chronic combined systolic and diastolic CHF (congestive heart failure) (HCC)   Polysubstance abuse (HCC)  Acute symptomatic atrial flutter with RVR -w/ 3-1 conduction  -initially started on diltiazem in the ED but will discontinue due to EF of 25% in Oct 2021 echo and current worsening CHF  -metoprolol succinate 25mg  daily  -lovenox bridge to warfarin per pharmacy -Cardiology reevaluated, holding off on TEE/DCCV given rapid improvement overnight with reinitiation of medication  Acute on chronic CHF exacerbation -Last echocardiogram on 12/2019 with EF 25 to 30% -Repeat echocardiogram -Strict I's and O's -Daily weights -Daily IV 40 mg Lasix  Elevated troponin in the setting of demand ischemia and rapid A. fib/flutter -Troponin flat, unlikely  ACS -Likely concurrently provoked by cocaine positive status  Polysubstance abuse -Patient was using 42 ounce of beer daily and cocaine last night -Cocaine positive UDS  HTN -uncontrolled  -losartan 25mg    Medical non-compliant  -pt unable to afford medications. consult TOCM for assistance   DVT prophylaxis: None - history of HIT - SCD/early ambulation Code Status: Full Family Communication: None present  Status is: Inpatient  Dispo: The patient is from: Home              Anticipated d/c is to: Home              Anticipated d/c date is: 48 to 72 hours              Patient currently not medically stable for discharge  Consultants:   Cardiology  Procedures:   None  Antimicrobials:  None  Subjective: No acute issues or events overnight, somewhat rapid speech this morning and anxious about his clinical course but otherwise denies nausea vomiting diarrhea constipation headache fevers chills chest pain or shortness of breath.  Objective: Vitals:   08/01/20 0656 08/01/20 0700 08/01/20 0742 08/01/20 0743  BP:    (!) 172/130  Pulse:   (!) 135 (!) 134  Resp: 20  19   Temp: 98.5 F (36.9 C)   97.9 F (36.6 C)  TempSrc: Oral   Oral  SpO2: 98%   100%  Weight:  59.2 kg    Height:  5\' 10"  (1.778 m)      Intake/Output Summary (Last 24 hours) at 08/01/2020 0800 Last data filed at 08/01/2020 0241 Gross  per 24 hour  Intake 514.21 ml  Output 1700 ml  Net -1185.79 ml   Filed Weights   08/01/20 0700  Weight: 59.2 kg    Examination:  General:  Pleasantly resting in bed, No acute distress. HEENT:  Normocephalic atraumatic.  Sclerae nonicteric, noninjected.  Extraocular movements intact bilaterally. Neck:  Without mass or deformity.  Trachea is midline. Lungs:  Clear to auscultate bilaterally without rhonchi, wheeze, or rales. Heart: Irregularly irregular tachycardic around 120.  Without murmurs, rubs, or gallops. Abdomen:  Soft, nontender, nondistended.  Without  guarding or rebound. Extremities: Without cyanosis, clubbing, edema, or obvious deformity. Vascular:  Dorsalis pedis and posterior tibial pulses palpable bilaterally. Skin:  Warm and dry, no erythema, no ulcerations.   Data Reviewed: I have personally reviewed following labs and imaging studies  CBC: Recent Labs  Lab 07/31/20 1742 08/01/20 0442  WBC 7.2 7.3  HGB 11.5* 13.2  HCT 35.2* 40.2  MCV 94.6 94.8  PLT 168 564   Basic Metabolic Panel: Recent Labs  Lab 07/31/20 1742 08/01/20 0442  NA 136 140  K 4.6 4.0  CL 109 105  CO2 19* 25  GLUCOSE 149* 85  BUN 20 22  CREATININE 1.27* 1.36*  CALCIUM 8.6* 9.0   GFR: Estimated Creatinine Clearance: 47.8 mL/min (A) (by C-G formula based on SCr of 1.36 mg/dL (H)). Liver Function Tests: No results for input(s): AST, ALT, ALKPHOS, BILITOT, PROT, ALBUMIN in the last 168 hours. No results for input(s): LIPASE, AMYLASE in the last 168 hours. No results for input(s): AMMONIA in the last 168 hours. Coagulation Profile: Recent Labs  Lab 08/01/20 0442  INR 3.7*   Cardiac Enzymes: No results for input(s): CKTOTAL, CKMB, CKMBINDEX, TROPONINI in the last 168 hours. BNP (last 3 results) No results for input(s): PROBNP in the last 8760 hours. HbA1C: No results for input(s): HGBA1C in the last 72 hours. CBG: No results for input(s): GLUCAP in the last 168 hours. Lipid Profile: No results for input(s): CHOL, HDL, LDLCALC, TRIG, CHOLHDL, LDLDIRECT in the last 72 hours. Thyroid Function Tests: Recent Labs    08/01/20 0443  TSH 3.816   Anemia Panel: No results for input(s): VITAMINB12, FOLATE, FERRITIN, TIBC, IRON, RETICCTPCT in the last 72 hours. Sepsis Labs: No results for input(s): PROCALCITON, LATICACIDVEN in the last 168 hours.  Recent Results (from the past 240 hour(s))  Resp Panel by RT-PCR (Flu A&B, Covid) Nasopharyngeal Swab     Status: None   Collection Time: 07/31/20  9:59 PM   Specimen: Nasopharyngeal Swab;  Nasopharyngeal(NP) swabs in vial transport medium  Result Value Ref Range Status   SARS Coronavirus 2 by RT PCR NEGATIVE NEGATIVE Final    Comment: (NOTE) SARS-CoV-2 target nucleic acids are NOT DETECTED.  The SARS-CoV-2 RNA is generally detectable in upper respiratory specimens during the acute phase of infection. The lowest concentration of SARS-CoV-2 viral copies this assay can detect is 138 copies/mL. A negative result does not preclude SARS-Cov-2 infection and should not be used as the sole basis for treatment or other patient management decisions. A negative result may occur with  improper specimen collection/handling, submission of specimen other than nasopharyngeal swab, presence of viral mutation(s) within the areas targeted by this assay, and inadequate number of viral copies(<138 copies/mL). A negative result must be combined with clinical observations, patient history, and epidemiological information. The expected result is Negative.  Fact Sheet for Patients:  EntrepreneurPulse.com.au  Fact Sheet for Healthcare Providers:  IncredibleEmployment.be  This test is no t  yet approved or cleared by the [place] and  has been authorized for detection and/or diagnosis of SARS-CoV-2 by FDA under an Emergency Use Authorization (EUA). This EUA will remain  in effect (meaning this test can be used) for the duration of the COVID-19 declaration under Section 564(b)(1) of the Act, 21 U.S.C.section 360bbb-3(b)(1), unless the authorization is terminated  or revoked sooner.       Influenza A by PCR NEGATIVE NEGATIVE Final   Influenza B by PCR NEGATIVE NEGATIVE Final    Comment: (NOTE) The Xpert Xpress SARS-CoV-2/FLU/RSV plus assay is intended as an aid in the diagnosis of influenza from Nasopharyngeal swab specimens and should not be used as a sole basis for treatment. Nasal washings and aspirates are unacceptable for Xpert Xpress  SARS-CoV-2/FLU/RSV testing.  Fact Sheet for Patients: EntrepreneurPulse.com.au  Fact Sheet for Healthcare Providers: IncredibleEmployment.be  This test is not yet approved or cleared by the [place] FDA and has been authorized for detection and/or diagnosis of SARS-CoV-2 by FDA under an Emergency Use Authorization (EUA). This EUA will remain in effect (meaning this test can be used) for the duration of the COVID-19 declaration under Section 564(b)(1) of the Act, 21 U.S.C. section 360bbb-3(b)(1), unless the authorization is terminated or revoked.  Performed at [facility], [place] [address], [place], [place] [address]          Radiology Studies: DG Chest 2 View  Result Date: 07/31/2020 CLINICAL DATA:  60 year old male with chest pain. EXAM: CHEST - 2 VIEW COMPARISON:  Chest radiograph dated 12/28/2019. FINDINGS: There is stable mild cardiomegaly. Small bilateral pleural effusions, slightly increased since the prior radiograph. No focal consolidation or pneumothorax. Degenerative changes of the spine. No acute osseous pathology. IMPRESSION: Small bilateral pleural effusions, increased since the prior radiograph. Electronically Signed   By: Anner Crete M.D.   On: 07/31/2020 18:35        Scheduled Meds: . enoxaparin (LOVENOX) injection  60 mg Subcutaneous Q12H  . folic acid  1 mg Oral Daily  . losartan  25 mg Oral Daily  . metoprolol succinate  25 mg Oral Daily  . multivitamin with minerals  1 tablet Oral Daily  . thiamine  100 mg Oral Daily   Or  . thiamine  100 mg Intravenous Daily  . Warfarin - Pharmacist Dosing Inpatient   Does not apply q1600    LOS: 1 day   Time spent: 56min  Breyana Follansbee C Pavan Bring, DO [facility]  If 7PM-7AM, please contact night-coverage [email or web address]  08/01/2020, 8:00 AM

## 2020-08-01 NOTE — Progress Notes (Deleted)
ANTICOAGULATION CONSULT NOTE - Initial Consult  Pharmacy Consult for Heparin and Warfarin Indication: atrial fibrillation  Allergies  Allergen Reactions  . Adhesive [Tape] Other (See Comments)    Heat and EKG pads flare pre-existing eczema  . Heparin Other (See Comments)    HIT ab +, SRA  negative    Patient Measurements:    Ht: 70 in Wt: ~62 kg Heparin Dosing Weight: 62 kg  Vital Signs: Temp: 98.9 F (37.2 C) (05/23 2256) Temp Source: Oral (05/23 2256) BP: 139/98 (05/23 2300) Pulse Rate: 59 (05/23 2330)  Labs: Recent Labs    07/31/20 1742 07/31/20 1949  HGB 11.5*  --   HCT 35.2*  --   PLT 168  --   CREATININE 1.27*  --   TROPONINIHS 50* 49*    CrCl cannot be calculated (Unknown ideal weight.).   Medical History: Past Medical History:  Diagnosis Date  . Anxiety   . Arthritis   . Bipolar 1 disorder ([place])   . Depression    Bipolar, not on meds/pt. stopped meds on own over 12 yrs ago  . GERD (gastroesophageal reflux disease)   . History of colon polyps   . Manic disorder ([place])   . Noncompliance 05/14/2019  . Substance abuse ([place])    73yrs ago smoke crack and marjuana  . Tobacco abuse 05/13/2012    Medications:  Pt has been off all medications for ~7 months  Assessment: 60 y.o. M presents with CP. To begin Lovenox bridge to coumadin for afib. Noted pt supposed to be on Eliquis since 10/21 but pt states that he couldn't afford so hasn't taken any of his meds in 7 mos.   Goal of Therapy:  INR 2-3 ; anti-Xa level 0.6-1 units/ml (4 hours post dose) Monitor platelets by anticoagulation protocol: Yes   Plan:  Daily INR Lovenox 60mg  SQ q12h CBC q72h while on Lovenox  Sherlon Handing, PharmD, BCPS Please see amion for complete clinical pharmacist phone list 08/01/2020,12:04 AM

## 2020-08-01 NOTE — TOC Benefit Eligibility Note (Signed)
Patient Teacher, English as a foreign language completed.    The patient is currently admitted and upon discharge could be taking Eliquis 5 mg.  The current 30 day co-pay is, $247.00 Due to a $200.00 deductible then will have a $47.00 copay per month.   The patient is currently admitted and upon discharge could be taking Xarelto 20 mg.  The current 30 day co-pay is, $247.00 Due to a $200.00 deductible then will have a $47.00 copay per month.    The patient is insured through [place], [place] Patient Advocate Specialist [place] Team Direct Number: [phone]  Fax: [phone]

## 2020-08-01 NOTE — Progress Notes (Signed)
  Echocardiogram 2D Echocardiogram has been performed.  Austin Cobb 08/01/2020, 2:49 PM

## 2020-08-01 NOTE — Progress Notes (Signed)
Received call from [facility]. Concerned that staff/MD not aware that Austin Cobb was "full blown" alcoholic. Stated he had not had alcohol in 36 hours and he called her confused and shaking. I explained that he had not exhibited any withdrawal symptoms and no shakes or tremors. I spoke with pt and he stated that he did not know what was going on with [name] but he was fine and not shaking or confused. CSW was in room at time. Pt resting with call bell within reach.  Will continue to monitor.

## 2020-08-01 NOTE — TOC Progression Note (Signed)
Transition of Care [facility]) - Progression Note    Patient Details  Name: Austin Cobb MRN: [id number] Date of Birth: [birth year]  Transition of Care [facility]) CM/SW [place], [place] Phone Number: 08/01/2020, 2:39 PM  Clinical Narrative:      CSW met with pt for substance use consult. CSW introduced himself and purpose. Pt immediately explains he doesn't need help with anything else but affording his medicine. CSW explained that there are limited options for medication since he has insurance. Pt explains he cannot cover his copays. CSW and pt discuss how pt could apply for medicaid at DSS to get his medications at a more affordable rate. Pt is aware of how to access DSS for services but expresses being frustrated with the system and historically not getting help from the system. CSW attempts to discuss substance use further which pt expresses he is not interested in as his medications are his primary focus. CSW left substance use education and tx resource list at pt bedside.       Expected Discharge Plan and Services                                                 Social Determinants of Health (SDOH) Interventions    Readmission Risk Interventions Readmission Risk Prevention Plan 12/27/2019  Transportation Screening Complete  PCP or Specialist Appt within 3-5 Days Complete  HRI or [place] Complete  Social Work Consult for [place] Planning/Counseling Complete  Palliative Care Screening Not Applicable  Medication Review Press photographer) Complete  Some recent data might be hidden

## 2020-08-01 NOTE — Progress Notes (Signed)
Mobility Specialist: Progress Note   08/01/20 1147  Mobility  Activity Ambulated in hall  Level of Assistance Standby assist, set-up cues, supervision of patient - no hands on  Assistive Device Four wheel walker  Distance Ambulated (ft) 210 ft  Mobility Ambulated with assistance in hallway  Mobility Response Tolerated well  Mobility performed by Mobility specialist  $Mobility charge 1 Mobility   Pre-Mobility: 132 HR, 149/107 BP During Mobility: 131 HR Post-Mobility: 131-133 HR  Pt c/o BLE fatigue during ambulation, otherwise asx. Pt sitting EOB after walk with call bell at his side.   [facility] Austin Cobb Mobility Specialist Mobility Specialist Phone: [phone]

## 2020-08-02 ENCOUNTER — Other Ambulatory Visit (HOSPITAL_COMMUNITY): Payer: Self-pay

## 2020-08-02 DIAGNOSIS — I4892 Unspecified atrial flutter: Secondary | ICD-10-CM | POA: Diagnosis not present

## 2020-08-02 DIAGNOSIS — F1910 Other psychoactive substance abuse, uncomplicated: Secondary | ICD-10-CM | POA: Diagnosis not present

## 2020-08-02 DIAGNOSIS — I5043 Acute on chronic combined systolic (congestive) and diastolic (congestive) heart failure: Secondary | ICD-10-CM | POA: Diagnosis not present

## 2020-08-02 DIAGNOSIS — I10 Essential (primary) hypertension: Secondary | ICD-10-CM | POA: Diagnosis not present

## 2020-08-02 LAB — COMPREHENSIVE METABOLIC PANEL
ALT: 14 U/L (ref 0–44)
AST: 20 U/L (ref 15–41)
Albumin: 3.5 g/dL (ref 3.5–5.0)
Alkaline Phosphatase: 71 U/L (ref 38–126)
Anion gap: 7 (ref 5–15)
BUN: 25 mg/dL — ABNORMAL HIGH (ref 8–23)
CO2: 26 mmol/L (ref 22–32)
Calcium: 8.9 mg/dL (ref 8.9–10.3)
Chloride: 105 mmol/L (ref 98–111)
Creatinine, Ser: 1.42 mg/dL — ABNORMAL HIGH (ref 0.61–1.24)
GFR, Estimated: 56 mL/min — ABNORMAL LOW (ref >60)
Glucose, Bld: 103 mg/dL — ABNORMAL HIGH (ref 70–99)
Potassium: 4.4 mmol/L (ref 3.5–5.1)
Sodium: 138 mmol/L (ref 135–145)
Total Bilirubin: 0.7 mg/dL (ref 0.3–1.2)
Total Protein: 6.6 g/dL (ref 6.5–8.1)

## 2020-08-02 LAB — LACTIC ACID, PLASMA: Lactic Acid, Venous: 1.6 mmol/L (ref 0.5–1.9)

## 2020-08-02 MED ORDER — BISACODYL 5 MG PO TBEC
5.0000 mg | DELAYED_RELEASE_TABLET | Freq: Two times a day (BID) | ORAL | Status: DC
  Administered 2020-08-02 – 2020-08-04 (×3): 5 mg via ORAL
  Filled 2020-08-02 (×3): qty 1

## 2020-08-02 MED ORDER — CARVEDILOL 12.5 MG PO TABS
12.5000 mg | ORAL_TABLET | Freq: Two times a day (BID) | ORAL | Status: DC
  Administered 2020-08-02 (×2): 12.5 mg via ORAL
  Filled 2020-08-02 (×2): qty 1

## 2020-08-02 MED ORDER — FUROSEMIDE 20 MG PO TABS
20.0000 mg | ORAL_TABLET | Freq: Every day | ORAL | Status: DC
  Administered 2020-08-02 – 2020-08-04 (×3): 20 mg via ORAL
  Filled 2020-08-02 (×3): qty 1

## 2020-08-02 MED ORDER — SACUBITRIL-VALSARTAN 24-26 MG PO TABS
1.0000 | ORAL_TABLET | Freq: Two times a day (BID) | ORAL | Status: DC
  Administered 2020-08-02 – 2020-08-04 (×4): 1 via ORAL
  Filled 2020-08-02 (×4): qty 1

## 2020-08-02 NOTE — Progress Notes (Addendum)
Heart Failure Navigation Team Progress Note  PCP: Patient, No Pcp Per (Inactive) Primary Cardiologist: Oswaldo Milian, MD Admitted from: from home  Past Medical History:  Diagnosis Date  . Anxiety   . Arthritis   . Bipolar 1 disorder ([place])   . Depression    Bipolar, not on meds/pt. stopped meds on own over 12 yrs ago  . GERD (gastroesophageal reflux disease)   . History of colon polyps   . Manic disorder ([place])   . Noncompliance 05/14/2019  . Substance abuse ([place])    62yrs ago smoke crack and marjuana  . Tobacco abuse 05/13/2012    Social History   Socioeconomic History  . Marital status: Divorced    Spouse name: Not on file  . Number of children: Not on file  . Years of education: Not on file  . Highest education level: Not on file  Occupational History  . Not on file  Tobacco Use  . Smoking status: Current Every Day Smoker    Packs/day: 1.00    Years: 35.00    Pack years: 35.00    Types: Cigarettes  . Smokeless tobacco: Never Used  Substance and Sexual Activity  . Alcohol use: Yes    Alcohol/week: 4.0 standard drinks    Types: 4 Cans of beer per week    Comment: 4 cans of beer a day   . Drug use: Yes    Types: Cocaine, Marijuana    Comment: Quit x6 yrs ago-   . Sexual activity: Never  Other Topics Concern  . Not on file  Social History Narrative  . Not on file   Social Determinants of Health   Financial Resource Strain: Low Risk   . Difficulty of Paying Living Expenses: Not very hard  Food Insecurity: No Food Insecurity  . Worried About Charity fundraiser in the Last Year: Never true  . Ran Out of Food in the Last Year: Never true  Transportation Needs: Unmet Transportation Needs  . Lack of Transportation (Medical): No  . Lack of Transportation (Non-Medical): Yes  Physical Activity: Not on file  Stress: Not on file  Social Connections: Not on file     Heart & Vascular Transition of Care Clinic follow-up: Scheduled for 08/10/20 at 3:30pm.   Confirmed patient enrolled in [employer] transportation.  Immediate social needs: Medication Assistance and CSW set up with [employer] transportation.  CSW spoke with patient at bedside and obtained their signature for [employer] transportation rider waiver and to bring them an appointment card for the [facility] outpatient clinic and encouraged them to follow up and to attend the appointment and bring their medications and if anything changes to please reach out so that CSW/HV clinic team can provide support.  Kerah Hardebeck, MSW, [place] Heart Failure Social Worker

## 2020-08-02 NOTE — Plan of Care (Signed)

## 2020-08-02 NOTE — TOC Benefit Eligibility Note (Signed)
Transition of Care [facility]) Benefit Eligibility Note    Patient Details  Name: Austin Cobb MRN: [id number] Date of Birth: 1960-11-09   Medication/Dose: Arne Cleveland  5 MG BID  Covered?: Yes  Tier: 3 Drug  Prescription Coverage Preferred Pharmacy: [facility]   and   [place] with Person/Company/Phone Number:: CARLI  @ [employer] # [phone] OPT- 2  Co-Pay: $37.00  Prior Approval: No  Deductible: Unmet (OUT-OF-POCKET:UNMET)  Additional Notes: ENTRESTO 24-26  MG BID : COVER- YES   CO-PAY- $37.00    TIER- 3 DRUG   P/A- NO    Memory Argue Phone Number: 08/02/2020, 11:03 AM

## 2020-08-02 NOTE — Progress Notes (Signed)
Heart Failure Stewardship Pharmacist Progress Note   PCP: Patient, No Pcp Per (Inactive) PCP-Cardiologist: Donato Heinz, MD    HPI:  60 YO male with PMH significant for nonischemic cardiomyopathy, polysubstance abuse, nonobstructive CAD, bipolar disorder, atrial flutter. Presents with shortness of breath and a flutter with RVR. ECHO on 08/01/20 showed EF <20%, with severely decreased function in the left ventricle; right ventricular function normal. Of note, patient historically non-adherent to medications due to high cost. He did get approved for patient assistance program for Entresto. Patient assistance for Eliquis was previously denied in 2021 due to out-of-pocket requirements.   Current HF Medications: Furosemide 20 mg daily Carvedilol 12.5 mg BID Losartan 25 mg daily Spironolactone 25 mg daily  Prior to admission HF Medications: Carvedilol 25 mg BID (not taking due to cost) Furosemide 20 mg daily (not taking due to cost) Entresto 24-26 mg BID (not taking due to cost) Spironolactone 25 mg daily (not taking due to cost)   Pertinent Lab Values: . Serum creatinine 1.42<<1.36, BUN 25, Potassium 4.4, Sodium 138, BNP 2128  Vital Signs: . Weight: 136.9 lbs (admission weight: 130 lbs) . Blood pressure: 130/90-100s  . Heart rate: 120-130s   Medication Assistance / Insurance Benefits Check: Does the patient have prescription insurance?  Yes Type of insurance plan: Medicare [employer] - Patient has been recommended to apply for Medicaid as well   Does the patient qualify for medication assistance through manufacturers or grants?   Pending  Outpatient Pharmacy:  Prior to admission outpatient pharmacy: [facility]  Is the patient willing to use [facility] at discharge? Yes Is the patient willing to transition their outpatient pharmacy to utilize a [facility] outpatient pharmacy?   Pending    Assessment: 1. Acute on chronic systolic CHF (EF <16%), due to  polysubstance abuse. NYHA class II symptoms. - Agree with furosemide 20 mg PO daily; no edema on MD exam this AM.  - Consider transitioning losartan to Entresto 24-26 mg BID prior to discharge. He is approved to receive Entresto for free from patient assistance program. Will provide patient with Manufacturer phone number to call to set up delivery to his home.  - Agree with increase in carvedilol to 12.5 mg BID - Continue spironolactone 25 mg daily  - Consider starting an SGLT-2 inhibitor in future if able to obtain patient assistance or dual coverage with medicaid    Plan: 1) Medication changes recommended at this time: - Consider switching losartan to Entresto 24/26 mg BID  2) Patient assistance: *Patient has a $200 deductible to meet, once this is met, the following copays will apply* - Jardiance Copay: $47/month - Farxiga Copay: $47/month  3)  Education  - To be completed prior to discharge  Harriet Pho, PharmD PGY-1 Community Pharmacy Resident   08/02/2020 11:20 AM   Kerby Nora, PharmD, BCPS Heart Failure Stewardship Pharmacist Phone [phone]

## 2020-08-02 NOTE — Discharge Instructions (Signed)

## 2020-08-02 NOTE — Progress Notes (Signed)
Progress Note  Patient Name: Austin Cobb Date of Encounter: 08/02/2020  [facility] Cardiologist: Donato Heinz, MD   Subjective   Lactate has normalized (2.3 > 1.6).  Cr stable (1.43 >1.42). BP improved (129/95).  Reported some dyspnea overnight but has improved.  Inpatient Medications    Scheduled Meds: . carvedilol  6.25 mg Oral BID WC  . folic acid  1 mg Oral Daily  . losartan  25 mg Oral Daily  . multivitamin with minerals  1 tablet Oral Daily  . Rivaroxaban  15 mg Oral Q supper  . spironolactone  25 mg Oral Daily  . thiamine  100 mg Oral Daily   Or  . thiamine  100 mg Intravenous Daily   Continuous Infusions:  PRN Meds: LORazepam **OR** LORazepam   Vital Signs    Vitals:   08/02/20 0000 08/02/20 0121 08/02/20 0351 08/02/20 0740  BP: 117/90  (!) 129/95   Pulse: (!) 132  99   Resp: 20 19 20    Temp:   98.4 F (36.9 C)   TempSrc:   Oral   SpO2: 97%  95%   Weight:    62.1 kg  Height:        Intake/Output Summary (Last 24 hours) at 08/02/2020 0806 Last data filed at 08/02/2020 0100 Gross per 24 hour  Intake 540 ml  Output --  Net 540 ml   Last 3 Weights 08/02/2020 08/01/2020 12/30/2019  Weight (lbs) 136 lb 14.5 oz 130 lb 8.2 oz 136 lb 7.4 oz  Weight (kg) 62.1 kg 59.2 kg 61.9 kg      Telemetry    AFL in 100s-130s- Personally Reviewed  ECG    No new ECG- Personally Reviewed  Physical Exam   GEN: No acute distress.   Neck: No JVD Cardiac: tachycardic, regular no murmurs Respiratory: Clear to auscultation bilaterally. GI: Soft, nontender, non-distended  MS: No edema Neuro:  Nonfocal  Psych: Normal affect   Labs    High Sensitivity Troponin:   Recent Labs  Lab 07/31/20 1742 07/31/20 1949  TROPONINIHS 50* 49*      Chemistry Recent Labs  Lab 08/01/20 0442 08/01/20 0758 08/02/20 0045  NA 140 140 138  K 4.0 3.7 4.4  CL 105 106 105  CO2 25 24 26   GLUCOSE 85 94 103*  BUN 22 22 25*  CREATININE 1.36* 1.43* 1.42*   CALCIUM 9.0 8.8* 8.9  PROT  --  6.4* 6.6  ALBUMIN  --  3.4* 3.5  AST  --  20 20  ALT  --  13 14  ALKPHOS  --  76 71  BILITOT  --  1.0 0.7  GFRNONAA 59* 56* 56*  ANIONGAP 10 10 7      Hematology Recent Labs  Lab 07/31/20 1742 08/01/20 0442  WBC 7.2 7.3  RBC 3.72* 4.24  HGB 11.5* 13.2  HCT 35.2* 40.2  MCV 94.6 94.8  MCH 30.9 31.1  MCHC 32.7 32.8  RDW 13.8 14.0  PLT 168 187    BNP Recent Labs  Lab 07/31/20 1804  BNP 2,128.0*     DDimer No results for input(s): DDIMER in the last 168 hours.   Radiology    DG Chest 2 View  Result Date: 07/31/2020 CLINICAL DATA:  60 year old male with chest pain. EXAM: CHEST - 2 VIEW COMPARISON:  Chest radiograph dated 12/28/2019. FINDINGS: There is stable mild cardiomegaly. Small bilateral pleural effusions, slightly increased since the prior radiograph. No focal consolidation or pneumothorax. Degenerative  changes of the spine. No acute osseous pathology. IMPRESSION: Small bilateral pleural effusions, increased since the prior radiograph. Electronically Signed   By: Anner Crete M.D.   On: 07/31/2020 18:35   ECHOCARDIOGRAM LIMITED  Result Date: 08/01/2020    ECHOCARDIOGRAM LIMITED REPORT   Patient Name:   Austin Cobb Date of Exam: 08/01/2020 Medical Rec #:  [id number]         Height:       70.0 in Accession #:    [id number]        Weight:       130.5 lb Date of Birth:  [birth year]          BSA:          1.741 m Patient Age:    60 years          BP:           149/107 mmHg Patient Gender: M                 HR:           131 bpm. Exam Location:  Inpatient Procedure: Limited Echo and Limited Color Doppler Indications:    I50.42 Chronic combined systolic (congestive) and diastolic                 (congestive) heart failure  History:        Patient has prior history of Echocardiogram examinations, most                 recent 12/25/2019. Risk Factors:Current Smoker. Polysubstance                 abuse. GERD.  Sonographer:    Jonelle Sidle Dance  Referring Phys: [id number] [place]  1. Left ventricular ejection fraction, by estimation, is <20%. The left ventricle has severely decreased function. The left ventricle demonstrates global hypokinesis. There is moderate left ventricular hypertrophy of the posterior segment.  2. Right ventricular systolic function is normal. The right ventricular size is mildly enlarged.  3. Left atrial size was moderately dilated.  4. The pericardial effusion is posterior to the left ventricle.  5. The mitral valve is normal in structure. Mild to moderate mitral valve regurgitation. No evidence of mitral stenosis.  6. The aortic valve is normal in structure. Aortic valve regurgitation is not visualized. No aortic stenosis is present.  7. The inferior vena cava is dilated in size with <50% respiratory variability, suggesting right atrial pressure of 15 mmHg. Comparison(s): Prior EF 25-30%. FINDINGS  Left Ventricle: Left ventricular ejection fraction, by estimation, is <20%. The left ventricle has severely decreased function. The left ventricle demonstrates global hypokinesis. The left ventricular internal cavity size was normal in size. There is moderate left ventricular hypertrophy of the posterior segment. Right Ventricle: The right ventricular size is mildly enlarged. No increase in right ventricular wall thickness. Right ventricular systolic function is normal. Left Atrium: Left atrial size was moderately dilated. Right Atrium: Right atrial size was normal in size. Pericardium: Trivial pericardial effusion is present. The pericardial effusion is posterior to the left ventricle. Mitral Valve: The mitral valve is normal in structure. There is moderate thickening of the mitral valve leaflet(s). Mild to moderate mitral valve regurgitation. No evidence of mitral valve stenosis. Tricuspid Valve: The tricuspid valve is normal in structure. Tricuspid valve regurgitation is mild . No evidence of tricuspid  stenosis. Aortic Valve: The aortic valve is normal in structure. Aortic valve regurgitation is not visualized.  No aortic stenosis is present. Pulmonic Valve: The pulmonic valve was normal in structure. Pulmonic valve regurgitation is mild. No evidence of pulmonic stenosis. Aorta: The aortic root is normal in size and structure. Venous: The inferior vena cava is dilated in size with less than 50% respiratory variability, suggesting right atrial pressure of 15 mmHg. IAS/Shunts: No atrial level shunt detected by color flow Doppler. LEFT VENTRICLE PLAX 2D LVIDd:         5.10 cm LVIDs:         4.70 cm LV PW:         1.80 cm LV IVS:        1.00 cm LVOT diam:     2.00 cm LVOT Area:     3.14 cm  RIGHT VENTRICLE RV Basal diam:  4.10 cm RV Mid diam:    3.10 cm LEFT ATRIUM              Index       RIGHT ATRIUM           Index LA diam:        4.30 cm  2.47 cm/m  RA Area:     21.80 cm LA Vol (A2C):   122.0 ml 70.07 ml/m RA Volume:   70.90 ml  40.72 ml/m LA Vol (A4C):   68.1 ml  39.12 ml/m LA Biplane Vol: 92.7 ml  53.24 ml/m   AORTA Ao Root diam: 3.30 cm Ao Asc diam:  2.80 cm  SHUNTS Systemic Diam: 2.00 cm Candee Furbish MD Electronically signed by Candee Furbish MD Signature Date/Time: 08/01/2020/3:01:00 PM    Final     Cardiac Studies   Echo 08/01/20: 1. Left ventricular ejection fraction, by estimation, is <20%. The left  ventricle has severely decreased function. The left ventricle demonstrates  global hypokinesis. There is moderate left ventricular hypertrophy of the  posterior segment.  2. Right ventricular systolic function is normal. The right ventricular  size is mildly enlarged.  3. Left atrial size was moderately dilated.  4. The pericardial effusion is posterior to the left ventricle.  5. The mitral valve is normal in structure. Mild to moderate mitral valve  regurgitation. No evidence of mitral stenosis.  6. The aortic valve is normal in structure. Aortic valve regurgitation is  not visualized.  No aortic stenosis is present.  7. The inferior vena cava is dilated in size with <50% respiratory  variability, suggesting right atrial pressure of 15 mmHg.   Patient Profile     60 y.o. male with a history of nonischemic cardiomyopathy attributed to polysubstance use disorder, nonobstructive CAD, bipolar disorder, paroxysmal atrial flutter who presents with shortness of breath and atrial flutter with rapid ventricular response  Assessment & Plan    Acute on chronic systolic and diastolic heart failure: Nonischemic cardiomyopathy, LVEF <20%.  Cath on 05/18/19 showed nonobstructive CAD.  Suspect NICM due to polysubstance abuse vs tachycardia induced cardiomyopathy. - Continue carvedilol 6.25 mg BID - Continue losartan 25 mg daily - Continue spironolactone 25 mg daily - Restart PO lasix 20 mg daily  Recurrent atrial flutter with 2-1 conduction not on anticoagulation - Discussed options with pharmacy.  Was given xarelto on admission but then also lovenox and warfarin ordered.  I do not think warfarin will be a good option for him.  When I brought up going to appointments for INR checks, he said he would not do that.  Recommend xarelto and trying to get patient assistance.   - Hold  off on TEE/DCCV for now, as he is not a good candidate due to noncompliance.  He did not take meds or followup after his admission in 05/2019, and on presentation in 12/2019 cardioversion was deferred given noncompliance.  He did not take meds or follow-up after that hospitalizaion.  Would recommend rate control strategy for now and cardioversion in 3 weeks if compliant with meds and follow-up  Hypertension: markedly hypertensive on admission, improved with restarting PO meds.  Continue coreg, losartan, and aldactone  Nonobstructive coronary artery disease - Would prefer statin, but forego for now in favor of simplifed medical regimen and cost.  CKD 3 - Monitor renal function   Medication nonadherence -  Discussed 4 dollar list, medical regimen optimized for cost and benefit, and how essential adherence is to recovery  Polysubstance use: UDS positive for cocaine  For questions or updates, please contact [facility] Please consult [email or web address] for contact info under        Signed, Donato Heinz, MD  08/02/2020, 8:06 AM

## 2020-08-02 NOTE — Progress Notes (Signed)
Austin Cobb:[id number] DOB: 19-Apr-1960 DOA: 07/31/2020 PCP: Patient, No Pcp Per (Inactive)     Brief Narrative:  60 y.o.BM PMHx combined systolic and diastolic CHF, HTN, Atrial flutter on Eliquis and history of heparin-induced thrombocytopenia   Presents with concerns of chest tightness,increasing fatigue since 4 days ago. He has not been taking any of hismedication for at least 9 months due to financial issues. 4 days ago he woke up with increasing chest tightness, increasing fatigue and shortness of breath with exertion. He endorsed alcohol use of about 42 ounce of beer daily, occasional cocaine use - mostly recently last night, and smokes half a pack per day.  In the ED, he was found to be in atrial flutter3-1 conduction with rates up to 130s. Blood pressure elevated systolic 741O over 878. BNP > 2000.Chest x-ray shows mild bilateral pleural effusion. He was given 5 mg of Lopressor and started on diltiazem drip.   Subjective: Afebrile overnight A/O x4,   Assessment & Plan: Covid vaccination;   Principal Problem:   Atrial flutter ([place]) Active Problems:   Essential hypertension   Acute on chronic combined systolic and diastolic CHF (congestive heart failure) (HCC)   Polysubstance abuse (HCC)  Acute symptomatic atrial flutter with RVR -Resolved - Patient with worsening CHF EF now<20% as compared to 25 to 30% in 12/25/2019 -Coreg 12.5 mg BID - Lasix 20 mg daily - Entresto 24-26 BID -Spironolactone 25 mg daily - Strict in and out - Daily weight  Acute on chronic CHF exacerbation -See a flutter with RVR  HTN -See a flutter with RVR  Elevated troponin in the setting of demand ischemia and rapid A. fib/flutter -Troponin flat, unlikely ACS -Likely concurrently provoked by cocaine positive status   Polysubstance MVEHM@@@@@@ -On admission patient UDS positive cocaine, was using 42 ounce of beer daily  -CIWA protocol - 08/18/2022 consult  social work: Resources for discontinuing cocaine/EtOH abuse  Medical non-compliant  -Pt unable to afford medications. consult TOCM for assistance -Counseled patient at length concerning his worsening cardiac status and that if he did not discontinue polysubstance abuse this would lead to his DEATH.  Constipation - 08/18/22 bisacodyl 5 mg BID  Goals of care - 08-18-2022 palliative care consult; patient continues to use cocaine, EtOH, does not attend follow-up appointments as outpatient, noncompliant with medication.  Evaluate for changing CODE STATUS to DNR, at home palliative care program upon discharge.       DVT prophylaxis: Xarelto Code Status: Full Family Communication:  Status is: Inpatient    Dispo: The patient is from: Home              Anticipated d/c is to: Home              Anticipated d/c date is: 5/28              Patient currently unstable      Consultants:  Cardiology  Procedures/Significant Events:  5/24 echocardiogram limited:Left Ventricle: Left ventricular ejection fraction, by estimation, is  <20%. The left ventricle has severely decreased function. The left  ventricle demonstrates global hypokinesis. The left ventricular internal  cavity size was normal in size. There is  moderate left ventricular hypertrophy of the posterior segment.   Right Ventricle: The right ventricular size is mildly enlarged. No  increase in right ventricular wall thickness. Right ventricular systolic  function is normal.   Left Atrium: Left atrial size was moderately dilated.   Right Atrium: Right  atrial size was normal in size.   Pericardium: Trivial pericardial effusion is present. The pericardial  effusion is posterior to the left ventricle.   Mitral Valve: The mitral valve is normal in structure. There is moderate  thickening of the mitral valve leaflet(s). Mild to moderate mitral valve  regurgitation. No evidence of mitral valve stenosis.   Tricuspid Valve: The tricuspid  valve is normal in structure. Tricuspid  valve regurgitation is mild . No evidence of tricuspid stenosis.   Aortic Valve: The aortic valve is normal in structure. Aortic valve  regurgitation is not visualized. No aortic stenosis is present.   Pulmonic Valve: The pulmonic valve was normal in structure. Pulmonic valve  regurgitation is mild. No evidence of pulmonic stenosis.   Aorta: The aortic root is normal in size and structure.   Venous: The inferior vena cava is dilated in size with less than 50%  respiratory variability, suggesting right atrial pressure of 15 mmHg.   I have personally reviewed and interpreted all radiology studies and my findings are as above.  VENTILATOR SETTINGS: Room air 5/25   Cultures   Antimicrobials: Anti-infectives (From admission, onward)   None       Devices    LINES / TUBES:      Continuous Infusions:   Objective: Vitals:   08/02/20 0121 08/02/20 0351 08/02/20 0740 08/02/20 0835  BP:  (!) 129/95  (!) 130/95  Pulse:  99  (!) 130  Resp: 19 20  20   Temp:  98.4 F (36.9 C)  98.6 F (37 C)  TempSrc:  Oral  Oral  SpO2:  95%  99%  Weight:   62.1 kg   Height:        Intake/Output Summary (Last 24 hours) at 08/02/2020 9798 Last data filed at 08/02/2020 0100 Gross per 24 hour  Intake 540 ml  Output --  Net 540 ml   Filed Weights   08/01/20 0700 08/02/20 0740  Weight: 59.2 kg 62.1 kg    Examination:  General: A/O x4,, No acute respiratory distress, cachectic Eyes: negative scleral hemorrhage, negative anisocoria, negative icterus ENT: Negative Runny nose, negative gingival bleeding, Neck:  Negative scars, masses, torticollis, lymphadenopathy, JVD Lungs: Clear to auscultation bilaterally without wheezes or crackles Cardiovascular: Regular rate and rhythm without murmur gallop or rub normal S1 and S2 Abdomen: negative abdominal pain, nondistended, positive soft, bowel sounds, no rebound, no ascites, no appreciable  mass Extremities: No significant cyanosis, clubbing, or edema bilateral lower extremities Skin: Negative rashes, lesions, ulcers Psychiatric:  Negative depression, negative anxiety, negative fatigue, negative mania, EXTREMELY POOR understanding of disease process Central nervous system:  Cranial nerves II through XII intact, tongue/uvula midline, all extremities muscle strength 5/5, sensation intact throughout, negative dysarthria, negative expressive aphasia, negative receptive aphasia.  .     Data Reviewed: Care during the described time interval was provided by me .  I have reviewed this patient's available data, including medical history, events of note, physical examination, and all test results as part of my evaluation.  CBC: Recent Labs  Lab 07/31/20 1742 08/01/20 0442  WBC 7.2 7.3  HGB 11.5* 13.2  HCT 35.2* 40.2  MCV 94.6 94.8  PLT 168 921   Basic Metabolic Panel: Recent Labs  Lab 07/31/20 1742 08/01/20 0442 08/01/20 0758 08/02/20 0045  NA 136 140 140 138  K 4.6 4.0 3.7 4.4  CL 109 105 106 105  CO2 19* 25 24 26   GLUCOSE 149* 85 94 103*  BUN 20 22  22 25*  CREATININE 1.27* 1.36* 1.43* 1.42*  CALCIUM 8.6* 9.0 8.8* 8.9   GFR: Estimated Creatinine Clearance: 48 mL/min (A) (by C-G formula based on SCr of 1.42 mg/dL (H)). Liver Function Tests: Recent Labs  Lab 08/01/20 0758 08/02/20 0045  AST 20 20  ALT 13 14  ALKPHOS 76 71  BILITOT 1.0 0.7  PROT 6.4* 6.6  ALBUMIN 3.4* 3.5   No results for input(s): LIPASE, AMYLASE in the last 168 hours. No results for input(s): AMMONIA in the last 168 hours. Coagulation Profile: Recent Labs  Lab 08/01/20 0442 08/01/20 0758  INR 3.7* 3.8*   Cardiac Enzymes: No results for input(s): CKTOTAL, CKMB, CKMBINDEX, TROPONINI in the last 168 hours. BNP (last 3 results) No results for input(s): PROBNP in the last 8760 hours. HbA1C: No results for input(s): HGBA1C in the last 72 hours. CBG: No results for input(s): GLUCAP in  the last 168 hours. Lipid Profile: No results for input(s): CHOL, HDL, LDLCALC, TRIG, CHOLHDL, LDLDIRECT in the last 72 hours. Thyroid Function Tests: Recent Labs    08/01/20 0443  TSH 3.816   Anemia Panel: No results for input(s): VITAMINB12, FOLATE, FERRITIN, TIBC, IRON, RETICCTPCT in the last 72 hours. Sepsis Labs: Recent Labs  Lab 08/01/20 1027 08/02/20 0045  LATICACIDVEN 2.3* 1.6    Recent Results (from the past 240 hour(s))  Resp Panel by RT-PCR (Flu A&B, Covid) Nasopharyngeal Swab     Status: None   Collection Time: 07/31/20  9:59 PM   Specimen: Nasopharyngeal Swab; Nasopharyngeal(NP) swabs in vial transport medium  Result Value Ref Range Status   SARS Coronavirus 2 by RT PCR NEGATIVE NEGATIVE Final    Comment: (NOTE) SARS-CoV-2 target nucleic acids are NOT DETECTED.  The SARS-CoV-2 RNA is generally detectable in upper respiratory specimens during the acute phase of infection. The lowest concentration of SARS-CoV-2 viral copies this assay can detect is 138 copies/mL. A negative result does not preclude SARS-Cov-2 infection and should not be used as the sole basis for treatment or other patient management decisions. A negative result may occur with  improper specimen collection/handling, submission of specimen other than nasopharyngeal swab, presence of viral mutation(s) within the areas targeted by this assay, and inadequate number of viral copies(<138 copies/mL). A negative result must be combined with clinical observations, patient history, and epidemiological information. The expected result is Negative.  Fact Sheet for Patients:  EntrepreneurPulse.com.au  Fact Sheet for Healthcare Providers:  IncredibleEmployment.be  This test is no t yet approved or cleared by the [place] FDA and  has been authorized for detection and/or diagnosis of SARS-CoV-2 by FDA under an Emergency Use Authorization (EUA). This EUA will remain   in effect (meaning this test can be used) for the duration of the COVID-19 declaration under Section 564(b)(1) of the Act, 21 U.S.C.section 360bbb-3(b)(1), unless the authorization is terminated  or revoked sooner.       Influenza A by PCR NEGATIVE NEGATIVE Final   Influenza B by PCR NEGATIVE NEGATIVE Final    Comment: (NOTE) The Xpert Xpress SARS-CoV-2/FLU/RSV plus assay is intended as an aid in the diagnosis of influenza from Nasopharyngeal swab specimens and should not be used as a sole basis for treatment. Nasal washings and aspirates are unacceptable for Xpert Xpress SARS-CoV-2/FLU/RSV testing.  Fact Sheet for Patients: EntrepreneurPulse.com.au  Fact Sheet for Healthcare Providers: IncredibleEmployment.be  This test is not yet approved or cleared by the [place] FDA and has been authorized for detection and/or diagnosis of SARS-CoV-2 by  FDA under an Emergency Use Authorization (EUA). This EUA will remain in effect (meaning this test can be used) for the duration of the COVID-19 declaration under Section 564(b)(1) of the Act, 21 U.S.C. section 360bbb-3(b)(1), unless the authorization is terminated or revoked.  Performed at [facility], [place] [address], [place], [place] [address]          Radiology Studies: DG Chest 2 View  Result Date: 07/31/2020 CLINICAL DATA:  60 year old male with chest pain. EXAM: CHEST - 2 VIEW COMPARISON:  Chest radiograph dated 12/28/2019. FINDINGS: There is stable mild cardiomegaly. Small bilateral pleural effusions, slightly increased since the prior radiograph. No focal consolidation or pneumothorax. Degenerative changes of the spine. No acute osseous pathology. IMPRESSION: Small bilateral pleural effusions, increased since the prior radiograph. Electronically Signed   By: Anner Crete M.D.   On: 07/31/2020 18:35   ECHOCARDIOGRAM LIMITED  Result Date: 08/01/2020    ECHOCARDIOGRAM LIMITED  REPORT   Patient Name:   KUTTER SCHNEPF Date of Exam: 08/01/2020 Medical Rec #:  [id number]         Height:       70.0 in Accession #:    [id number]        Weight:       130.5 lb Date of Birth:  [birth year]          BSA:          1.741 m Patient Age:    89 years          BP:           149/107 mmHg Patient Gender: M                 HR:           131 bpm. Exam Location:  Inpatient Procedure: Limited Echo and Limited Color Doppler Indications:    I50.42 Chronic combined systolic (congestive) and diastolic                 (congestive) heart failure  History:        Patient has prior history of Echocardiogram examinations, most                 recent 12/25/2019. Risk Factors:Current Smoker. Polysubstance                 abuse. GERD.  Sonographer:    Jonelle Sidle Dance Referring Phys: [id number] [place]  1. Left ventricular ejection fraction, by estimation, is <20%. The left ventricle has severely decreased function. The left ventricle demonstrates global hypokinesis. There is moderate left ventricular hypertrophy of the posterior segment.  2. Right ventricular systolic function is normal. The right ventricular size is mildly enlarged.  3. Left atrial size was moderately dilated.  4. The pericardial effusion is posterior to the left ventricle.  5. The mitral valve is normal in structure. Mild to moderate mitral valve regurgitation. No evidence of mitral stenosis.  6. The aortic valve is normal in structure. Aortic valve regurgitation is not visualized. No aortic stenosis is present.  7. The inferior vena cava is dilated in size with <50% respiratory variability, suggesting right atrial pressure of 15 mmHg. Comparison(s): Prior EF 25-30%. FINDINGS  Left Ventricle: Left ventricular ejection fraction, by estimation, is <20%. The left ventricle has severely decreased function. The left ventricle demonstrates global hypokinesis. The left ventricular internal cavity size was normal in size. There is moderate  left ventricular hypertrophy of the posterior segment. Right Ventricle:  The right ventricular size is mildly enlarged. No increase in right ventricular wall thickness. Right ventricular systolic function is normal. Left Atrium: Left atrial size was moderately dilated. Right Atrium: Right atrial size was normal in size. Pericardium: Trivial pericardial effusion is present. The pericardial effusion is posterior to the left ventricle. Mitral Valve: The mitral valve is normal in structure. There is moderate thickening of the mitral valve leaflet(s). Mild to moderate mitral valve regurgitation. No evidence of mitral valve stenosis. Tricuspid Valve: The tricuspid valve is normal in structure. Tricuspid valve regurgitation is mild . No evidence of tricuspid stenosis. Aortic Valve: The aortic valve is normal in structure. Aortic valve regurgitation is not visualized. No aortic stenosis is present. Pulmonic Valve: The pulmonic valve was normal in structure. Pulmonic valve regurgitation is mild. No evidence of pulmonic stenosis. Aorta: The aortic root is normal in size and structure. Venous: The inferior vena cava is dilated in size with less than 50% respiratory variability, suggesting right atrial pressure of 15 mmHg. IAS/Shunts: No atrial level shunt detected by color flow Doppler. LEFT VENTRICLE PLAX 2D LVIDd:         5.10 cm LVIDs:         4.70 cm LV PW:         1.80 cm LV IVS:        1.00 cm LVOT diam:     2.00 cm LVOT Area:     3.14 cm  RIGHT VENTRICLE RV Basal diam:  4.10 cm RV Mid diam:    3.10 cm LEFT ATRIUM              Index       RIGHT ATRIUM           Index LA diam:        4.30 cm  2.47 cm/m  RA Area:     21.80 cm LA Vol (A2C):   122.0 ml 70.07 ml/m RA Volume:   70.90 ml  40.72 ml/m LA Vol (A4C):   68.1 ml  39.12 ml/m LA Biplane Vol: 92.7 ml  53.24 ml/m   AORTA Ao Root diam: 3.30 cm Ao Asc diam:  2.80 cm  SHUNTS Systemic Diam: 2.00 cm Candee Furbish MD Electronically signed by Candee Furbish MD Signature  Date/Time: 08/01/2020/3:01:00 PM    Final         Scheduled Meds: . carvedilol  12.5 mg Oral BID WC  . folic acid  1 mg Oral Daily  . furosemide  20 mg Oral Daily  . losartan  25 mg Oral Daily  . multivitamin with minerals  1 tablet Oral Daily  . Rivaroxaban  15 mg Oral Q supper  . spironolactone  25 mg Oral Daily  . thiamine  100 mg Oral Daily   Or  . thiamine  100 mg Intravenous Daily   Continuous Infusions:   LOS: 2 days    Time spent:40 min    Kyia Rhude, Geraldo Docker, MD [facility]   If 7PM-7AM, please contact night-coverage 08/02/2020, 9:28 AM

## 2020-08-03 ENCOUNTER — Encounter (HOSPITAL_COMMUNITY): Payer: Self-pay | Admitting: Family Medicine

## 2020-08-03 ENCOUNTER — Other Ambulatory Visit: Payer: Self-pay

## 2020-08-03 DIAGNOSIS — I5043 Acute on chronic combined systolic (congestive) and diastolic (congestive) heart failure: Secondary | ICD-10-CM | POA: Diagnosis not present

## 2020-08-03 DIAGNOSIS — I4892 Unspecified atrial flutter: Secondary | ICD-10-CM | POA: Diagnosis not present

## 2020-08-03 DIAGNOSIS — I10 Essential (primary) hypertension: Secondary | ICD-10-CM | POA: Diagnosis not present

## 2020-08-03 LAB — CBC WITH DIFFERENTIAL/PLATELET
Abs Immature Granulocytes: 0.01 10*3/uL (ref 0.00–0.07)
Basophils Absolute: 0.1 10*3/uL (ref 0.0–0.1)
Basophils Relative: 2 %
Eosinophils Absolute: 0.3 10*3/uL (ref 0.0–0.5)
Eosinophils Relative: 5 %
HCT: 36.2 % — ABNORMAL LOW (ref 39.0–52.0)
Hemoglobin: 11.9 g/dL — ABNORMAL LOW (ref 13.0–17.0)
Immature Granulocytes: 0 %
Lymphocytes Relative: 25 %
Lymphs Abs: 1.6 10*3/uL (ref 0.7–4.0)
MCH: 30.9 pg (ref 26.0–34.0)
MCHC: 32.9 g/dL (ref 30.0–36.0)
MCV: 94 fL (ref 80.0–100.0)
Monocytes Absolute: 0.5 10*3/uL (ref 0.1–1.0)
Monocytes Relative: 9 %
Neutro Abs: 3.6 10*3/uL (ref 1.7–7.7)
Neutrophils Relative %: 59 %
Platelets: 174 10*3/uL (ref 150–400)
RBC: 3.85 MIL/uL — ABNORMAL LOW (ref 4.22–5.81)
RDW: 13.8 % (ref 11.5–15.5)
WBC: 6.1 10*3/uL (ref 4.0–10.5)
nRBC: 0 % (ref 0.0–0.2)

## 2020-08-03 LAB — COMPREHENSIVE METABOLIC PANEL
ALT: 12 U/L (ref 0–44)
AST: 19 U/L (ref 15–41)
Albumin: 3.5 g/dL (ref 3.5–5.0)
Alkaline Phosphatase: 70 U/L (ref 38–126)
Anion gap: 10 (ref 5–15)
BUN: 28 mg/dL — ABNORMAL HIGH (ref 8–23)
CO2: 26 mmol/L (ref 22–32)
Calcium: 9 mg/dL (ref 8.9–10.3)
Chloride: 100 mmol/L (ref 98–111)
Creatinine, Ser: 1.53 mg/dL — ABNORMAL HIGH (ref 0.61–1.24)
GFR, Estimated: 51 mL/min — ABNORMAL LOW (ref >60)
Glucose, Bld: 93 mg/dL (ref 70–99)
Potassium: 4.3 mmol/L (ref 3.5–5.1)
Sodium: 136 mmol/L (ref 135–145)
Total Bilirubin: 1 mg/dL (ref 0.3–1.2)
Total Protein: 6.6 g/dL (ref 6.5–8.1)

## 2020-08-03 LAB — PHOSPHORUS: Phosphorus: 5.5 mg/dL — ABNORMAL HIGH (ref 2.5–4.6)

## 2020-08-03 LAB — MAGNESIUM: Magnesium: 1.7 mg/dL (ref 1.7–2.4)

## 2020-08-03 MED ORDER — NICOTINE 21 MG/24HR TD PT24
21.0000 mg | MEDICATED_PATCH | Freq: Every day | TRANSDERMAL | Status: DC
  Administered 2020-08-03 – 2020-08-04 (×2): 21 mg via TRANSDERMAL
  Filled 2020-08-03 (×2): qty 1

## 2020-08-03 MED ORDER — CALCIUM CARBONATE ANTACID 500 MG PO CHEW
1.0000 | CHEWABLE_TABLET | Freq: Three times a day (TID) | ORAL | Status: DC | PRN
  Administered 2020-08-03: 200 mg via ORAL
  Filled 2020-08-03: qty 1

## 2020-08-03 MED ORDER — CARVEDILOL 25 MG PO TABS
25.0000 mg | ORAL_TABLET | Freq: Two times a day (BID) | ORAL | Status: DC
  Administered 2020-08-03 – 2020-08-04 (×3): 25 mg via ORAL
  Filled 2020-08-03 (×2): qty 1

## 2020-08-03 NOTE — Progress Notes (Signed)
Progress Note  Patient Name: Austin Cobb Date of Encounter: 08/03/2020  [facility] Cardiologist: Donato Heinz, MD   Subjective   BP 122/99 this morning.  Creatinine 1.53, from 1.42.  Denies any chest pain, dyspnea, or lightheadedness.  Inpatient Medications    Scheduled Meds: . bisacodyl  5 mg Oral BID  . carvedilol  12.5 mg Oral BID WC  . folic acid  1 mg Oral Daily  . furosemide  20 mg Oral Daily  . multivitamin with minerals  1 tablet Oral Daily  . Rivaroxaban  15 mg Oral Q supper  . sacubitril-valsartan  1 tablet Oral BID  . spironolactone  25 mg Oral Daily  . thiamine  100 mg Oral Daily   Or  . thiamine  100 mg Intravenous Daily   Continuous Infusions:  PRN Meds: LORazepam **OR** LORazepam   Vital Signs    Vitals:   08/02/20 2028 08/02/20 2327 08/03/20 0430 08/03/20 0737  BP: 120/89 (!) 119/94 (!) 143/96 (!) 122/99  Pulse: (!) 129 98 (!) 129 (!) 108  Resp: 17 20 17 18   Temp: 98.4 F (36.9 C) 97.6 F (36.4 C) 98.5 F (36.9 C) 98 F (36.7 C)  TempSrc: Oral Oral Oral Oral  SpO2: 99% 100% 100% 100%  Weight:   61.9 kg   Height:        Intake/Output Summary (Last 24 hours) at 08/03/2020 0759 Last data filed at 08/03/2020 0755 Gross per 24 hour  Intake 540 ml  Output --  Net 540 ml   Last 3 Weights 08/03/2020 08/02/2020 08/01/2020  Weight (lbs) 136 lb 7.4 oz 136 lb 14.5 oz 130 lb 8.2 oz  Weight (kg) 61.9 kg 62.1 kg 59.2 kg      Telemetry    AFL in the 90s to 120s- Personally Reviewed  ECG    No new ECG- Personally Reviewed  Physical Exam   GEN: No acute distress.   Neck: No JVD Cardiac: tachycardic, regular no murmurs Respiratory: Clear to auscultation bilaterally. GI: Soft, nontender, non-distended  MS: No edema Neuro:  Nonfocal  Psych: Normal affect   Labs    High Sensitivity Troponin:   Recent Labs  Lab 07/31/20 1742 07/31/20 1949  TROPONINIHS 50* 49*      Chemistry Recent Labs  Lab 08/01/20 0758  08/02/20 0045 08/03/20 0104  NA 140 138 136  K 3.7 4.4 4.3  CL 106 105 100  CO2 24 26 26   GLUCOSE 94 103* 93  BUN 22 25* 28*  CREATININE 1.43* 1.42* 1.53*  CALCIUM 8.8* 8.9 9.0  PROT 6.4* 6.6 6.6  ALBUMIN 3.4* 3.5 3.5  AST 20 20 19   ALT 13 14 12   ALKPHOS 76 71 70  BILITOT 1.0 0.7 1.0  GFRNONAA 56* 56* 51*  ANIONGAP 10 7 10      Hematology Recent Labs  Lab 07/31/20 1742 08/01/20 0442 08/03/20 0104  WBC 7.2 7.3 6.1  RBC 3.72* 4.24 3.85*  HGB 11.5* 13.2 11.9*  HCT 35.2* 40.2 36.2*  MCV 94.6 94.8 94.0  MCH 30.9 31.1 30.9  MCHC 32.7 32.8 32.9  RDW 13.8 14.0 13.8  PLT 168 187 174    BNP Recent Labs  Lab 07/31/20 1804  BNP 2,128.0*     DDimer No results for input(s): DDIMER in the last 168 hours.   Radiology    ECHOCARDIOGRAM LIMITED  Result Date: 08/01/2020    ECHOCARDIOGRAM LIMITED REPORT   Patient Name:   Austin Cobb Date of  Exam: 08/01/2020 Medical Rec #:  [id number]         Height:       70.0 in Accession #:    [id number]        Weight:       130.5 lb Date of Birth:  [birth year]          BSA:          1.741 m Patient Age:    16 years          BP:           149/107 mmHg Patient Gender: M                 HR:           131 bpm. Exam Location:  Inpatient Procedure: Limited Echo and Limited Color Doppler Indications:    I50.42 Chronic combined systolic (congestive) and diastolic                 (congestive) heart failure  History:        Patient has prior history of Echocardiogram examinations, most                 recent 12/25/2019. Risk Factors:Current Smoker. Polysubstance                 abuse. GERD.  Sonographer:    Jonelle Sidle Dance Referring Phys: [id number] [place]  1. Left ventricular ejection fraction, by estimation, is <20%. The left ventricle has severely decreased function. The left ventricle demonstrates global hypokinesis. There is moderate left ventricular hypertrophy of the posterior segment.  2. Right ventricular systolic function is  normal. The right ventricular size is mildly enlarged.  3. Left atrial size was moderately dilated.  4. The pericardial effusion is posterior to the left ventricle.  5. The mitral valve is normal in structure. Mild to moderate mitral valve regurgitation. No evidence of mitral stenosis.  6. The aortic valve is normal in structure. Aortic valve regurgitation is not visualized. No aortic stenosis is present.  7. The inferior vena cava is dilated in size with <50% respiratory variability, suggesting right atrial pressure of 15 mmHg. Comparison(s): Prior EF 25-30%. FINDINGS  Left Ventricle: Left ventricular ejection fraction, by estimation, is <20%. The left ventricle has severely decreased function. The left ventricle demonstrates global hypokinesis. The left ventricular internal cavity size was normal in size. There is moderate left ventricular hypertrophy of the posterior segment. Right Ventricle: The right ventricular size is mildly enlarged. No increase in right ventricular wall thickness. Right ventricular systolic function is normal. Left Atrium: Left atrial size was moderately dilated. Right Atrium: Right atrial size was normal in size. Pericardium: Trivial pericardial effusion is present. The pericardial effusion is posterior to the left ventricle. Mitral Valve: The mitral valve is normal in structure. There is moderate thickening of the mitral valve leaflet(s). Mild to moderate mitral valve regurgitation. No evidence of mitral valve stenosis. Tricuspid Valve: The tricuspid valve is normal in structure. Tricuspid valve regurgitation is mild . No evidence of tricuspid stenosis. Aortic Valve: The aortic valve is normal in structure. Aortic valve regurgitation is not visualized. No aortic stenosis is present. Pulmonic Valve: The pulmonic valve was normal in structure. Pulmonic valve regurgitation is mild. No evidence of pulmonic stenosis. Aorta: The aortic root is normal in size and structure. Venous: The inferior  vena cava is dilated in size with less than 50% respiratory variability, suggesting right atrial pressure of 15 mmHg. IAS/Shunts:  No atrial level shunt detected by color flow Doppler. LEFT VENTRICLE PLAX 2D LVIDd:         5.10 cm LVIDs:         4.70 cm LV PW:         1.80 cm LV IVS:        1.00 cm LVOT diam:     2.00 cm LVOT Area:     3.14 cm  RIGHT VENTRICLE RV Basal diam:  4.10 cm RV Mid diam:    3.10 cm LEFT ATRIUM              Index       RIGHT ATRIUM           Index LA diam:        4.30 cm  2.47 cm/m  RA Area:     21.80 cm LA Vol (A2C):   122.0 ml 70.07 ml/m RA Volume:   70.90 ml  40.72 ml/m LA Vol (A4C):   68.1 ml  39.12 ml/m LA Biplane Vol: 92.7 ml  53.24 ml/m   AORTA Ao Root diam: 3.30 cm Ao Asc diam:  2.80 cm  SHUNTS Systemic Diam: 2.00 cm Candee Furbish MD Electronically signed by Candee Furbish MD Signature Date/Time: 08/01/2020/3:01:00 PM    Final     Cardiac Studies   Echo 08/01/20: 1. Left ventricular ejection fraction, by estimation, is <20%. The left  ventricle has severely decreased function. The left ventricle demonstrates  global hypokinesis. There is moderate left ventricular hypertrophy of the  posterior segment.  2. Right ventricular systolic function is normal. The right ventricular  size is mildly enlarged.  3. Left atrial size was moderately dilated.  4. The pericardial effusion is posterior to the left ventricle.  5. The mitral valve is normal in structure. Mild to moderate mitral valve  regurgitation. No evidence of mitral stenosis.  6. The aortic valve is normal in structure. Aortic valve regurgitation is  not visualized. No aortic stenosis is present.  7. The inferior vena cava is dilated in size with <50% respiratory  variability, suggesting right atrial pressure of 15 mmHg.   Patient Profile     60 y.o. male with a history of nonischemic cardiomyopathy attributed to polysubstance use disorder, nonobstructive CAD, bipolar disorder, paroxysmal atrial flutter  who presents with shortness of breath and atrial flutter with rapid ventricular response  Assessment & Plan    Acute on chronic systolic and diastolic heart failure: Nonischemic cardiomyopathy, LVEF <20%.  Cath on 05/18/19 showed nonobstructive CAD.  Suspect NICM due to polysubstance abuse vs tachycardia induced cardiomyopathy. - Continue carvedilol, increase to 25 mg twice daily - Continue Entresto 24-26 mg twice daily, will be free to patient per pharmacy - Continue spironolactone 25 mg daily -Continue PO lasix 20 mg daily  Recurrent atrial flutter with 2-1 conduction not on anticoagulation - Discussed options with pharmacy.  Was given xarelto on admission but then also lovenox and warfarin ordered.  I do not think warfarin will be a good option for him.  When I brought up going to appointments for INR checks, he said he would not do that.  Recommend xarelto and trying to get patient assistance.  Per pharmacy, copay will be $47  - Hold off on TEE/DCCV for now, as he is not a good candidate due to noncompliance.  He did not take meds or followup after his admission in 05/2019, and on presentation in 12/2019 cardioversion was deferred given noncompliance.  He did  not take meds or follow-up after that hospitalizaion.  Would recommend rate control strategy for now and cardioversion in 3 weeks if compliant with meds and follow-up  Hypertension: markedly hypertensive on admission, improved with restarting PO meds.  Continue coreg, entresto, and aldactone  Nonobstructive coronary artery disease - Would prefer statin, but forego for now in favor of simplifed medical regimen and cost.  CKD 3 - Monitor renal function   Medication nonadherence - Discussed 4 dollar list, medical regimen optimized for cost and benefit, and how essential adherence is to recovery  Polysubstance use: UDS positive for cocaine  For questions or updates, please contact [facility] Please consult [email or web address] for  contact info under        Signed, Donato Heinz, MD  08/03/2020, 7:59 AM

## 2020-08-03 NOTE — Progress Notes (Signed)
Mobility Specialist: Progress Note   08/03/20 1235  Mobility  Activity Ambulated in hall  Level of Assistance Independent  Assistive Device Four wheel walker  Distance Ambulated (ft) 390 ft  Mobility Ambulated independently in hallway  Mobility Response Tolerated well  Mobility performed by Mobility specialist  $Mobility charge 1 Mobility   Post-Mobility: 127 HR, 119/94 BP  Pt asx during ambulation. Pt back to bed after walk with call bell at his side.   [facility] Austin Cobb Mobility Specialist Mobility Specialist Phone: [phone]

## 2020-08-03 NOTE — Progress Notes (Addendum)
Heart Failure Stewardship Pharmacist Progress Note   PCP: Patient, No Pcp Per (Inactive) PCP-Cardiologist: Donato Heinz, MD    HPI:  60 YO male with PMH significant for nonischemic cardiomyopathy, polysubstance abuse, nonobstructive CAD, bipolar disorder, atrial flutter. Presents with shortness of breath and a flutter with RVR. ECHO on 08/01/20 showed EF <20%, with severely decreased function in the left ventricle; right ventricular function normal. Of note, patient historically non-adherent to medications due to high cost. He did get approved for patient assistance program for Entresto. Patient assistance for Eliquis was previously denied in 2021 due to out-of-pocket requirements.   Current HF Medications: Furosemide 20 mg daily Carvedilol 25 mg BID Entresto 24/26 mg BID Spironolactone 25 mg daily  Prior to admission HF Medications: Carvedilol 25 mg BID (not taking due to cost) Furosemide 20 mg daily (not taking due to cost) Entresto 24-26 mg BID (not taking due to cost) Spironolactone 25 mg daily (not taking due to cost)  Pertinent Lab Values: Serum creatinine 1.53<1.42<1.36, BUN 28, Potassium 4.3, Sodium 136, BNP 2128, Magnesium 1.7  Vital Signs: Weight: 136.9 lbs (admission weight: 130 lbs) Blood pressure: 120s/90s Heart rate: 120s  Medication Assistance / Insurance Benefits Check: Does the patient have prescription insurance?  Yes Type of insurance plan: Medicare [employer] - Patient has been recommended to apply for Medicaid as well   Does the patient qualify for medication assistance through manufacturers or grants?   Pending  Outpatient Pharmacy:  Prior to admission outpatient pharmacy: [facility]  Is the patient willing to use [facility] at discharge? Yes Is the patient willing to transition their outpatient pharmacy to utilize a [facility] outpatient pharmacy?   Pending    Assessment: 1. Acute on chronic systolic CHF (EF <42%), due to  polysubstance abuse. NYHA class II symptoms. - Continue furosemide 20 mg PO daily; no edema, no JVD on MD exam this AM.  - Continue Entresto 24-26 mg BID.  - Agree with increasing carvedilol to 25 mg BID - Continue spironolactone 25 mg daily  - Consider starting an SGLT-2 inhibitor in future if able to obtain patient assistance or dual coverage with medicaid    Plan: 1) Medication changes recommended at this time: - No recommendations at this time  2) Patient assistance: - He is approved to receive Entresto for free from patient assistance program - provided patient with Manufacturer phone number to call to set up delivery to his home.  -Patient has a $200 deductible to meet, once this is met, the following copays will apply: - Jardiance Copay: $47/month - Wilder Glade Copay: $47/month  3)  Education  - To be completed prior to discharge  Harriet Pho, PharmD PGY-1 Community Pharmacy Resident  08/03/2020 10:53 AM   Kerby Nora, PharmD, BCPS Heart Failure Stewardship Pharmacist Phone [phone]

## 2020-08-03 NOTE — Progress Notes (Addendum)
Afternoon medications were given (coreg, xarelto and tums) after 30 minutes of ingestion pt complains of increased pressure behind left eye, chest pain and sweats. No signs of stroke present, EKG taken, orthostatic VS taken and charted. Will continue to monitor   Chrisandra Carota, RN 08/03/2020 5:04 PM

## 2020-08-03 NOTE — Plan of Care (Signed)

## 2020-08-03 NOTE — Progress Notes (Signed)
PROGRESS NOTE  Austin Cobb:[id number] DOB: 09-13-1960 DOA: 07/31/2020 PCP: Patient, No Pcp Per (Inactive)   LOS: 3 days   Brief Narrative / Interim history: 60 year old male with history of EtOH abuse, cocaine use, combined systolic and diastolic CHF, HTN, a flutter, heparin-induced thrombocytopenia, comes into the hospital with complaints of chest tightness, fatigue, shortness of breath.  Patient has not been taking his home medications for the past 9 months due to financial issues.  He was admitted to hospital in a flutter with RVR, significantly hypertensive, cardiology consulted.  Subjective / 24h Interval events: Overall he is doing well this morning, denies any chest pain, denies any shortness of breath.  Frustrated with the whole process of him unable to afford his medication  Assessment & Plan: Principal Problem Symptomatic a flutter with RVR-cardiology consulted and following.  Goal right now is rate controlled and anticoagulation.  Currently on Coreg, dose has been increased today to 25 twice daily.  Continue anticoagulation with Xarelto  Active Problems Acute on chronic systolic CHF-most recent 2D echo showed an EF less than 20%.  Nonadherence is a big issue here, patient was unable to afford few of his medication including Eliquis as well as Entresto due to co-pays over $300 for each.  He states that he has been trying to reach "everybody" to get more assistance but was unable to do so.  Chart review shows that he is most recent attempt was 6 months ago in November 2021 with [facility] being unable to contact the patient.  Continue Entresto, Coreg, furosemide and spironolactone  Polysubstance abuse-UDS was positive for cocaine, he is using alcohol daily.  He tells me he will be unable to quit alcohol because it is in his DNA as his mother was drinking when she was pregnant with him.  He is not willing to discuss this further.  Elevated troponin-likely demand, in the setting of A.  fib with RVR  Essential hypertension-continue Coreg, furosemide, Xarelto, Entresto, spironolactone   Scheduled Meds: . bisacodyl  5 mg Oral BID  . carvedilol  25 mg Oral BID WC  . folic acid  1 mg Oral Daily  . furosemide  20 mg Oral Daily  . multivitamin with minerals  1 tablet Oral Daily  . Rivaroxaban  15 mg Oral Q supper  . sacubitril-valsartan  1 tablet Oral BID  . spironolactone  25 mg Oral Daily  . thiamine  100 mg Oral Daily   Continuous Infusions: PRN Meds:.LORazepam **OR** LORazepam  Diet Orders (From admission, onward)    Start     Ordered   08/01/20 0854  Diet Heart Room service appropriate? Yes; Fluid consistency: Thin  Diet effective now       Question Answer Comment  Room service appropriate? Yes   Fluid consistency: Thin      08/01/20 0854          DVT prophylaxis: SCDs Start: 07/31/20 2109 Rivaroxaban (XARELTO) tablet 15 mg     Code Status: Full Code  Family Communication: No family at bedside  Status is: Inpatient  Remains inpatient appropriate because:Inpatient level of care appropriate due to severity of illness  Dispo: The patient is from: Home              Anticipated d/c is to: Home              Patient currently is not medically stable to d/c.   Difficult to place patient No  Level of care: Progressive  Consultants:  Cardiology  Procedures:  2D echo:  IMPRESSIONS  1. Left ventricular ejection fraction, by estimation, is <20%. The left ventricle has severely decreased function. The left ventricle demonstrates global hypokinesis. There is moderate left ventricular hypertrophy of the posterior segment.  2. Right ventricular systolic function is normal. The right ventricular size is mildly enlarged.  3. Left atrial size was moderately dilated.  4. The pericardial effusion is posterior to the left ventricle.  5. The mitral valve is normal in structure. Mild to moderate mitral valve regurgitation. No evidence of mitral stenosis.  6.  The aortic valve is normal in structure. Aortic valve regurgitation is not visualized. No aortic stenosis is present.  7. The inferior vena cava is dilated in size with <50% respiratory variability, suggesting right atrial pressure of 15 mmHg.   Microbiology  none  Antimicrobials: none    Objective: Vitals:   08/02/20 2028 08/02/20 2327 08/03/20 0430 08/03/20 0737  BP: 120/89 (!) 119/94 (!) 143/96 (!) 122/99  Pulse: (!) 129 98 (!) 129 (!) 108  Resp: 17 20 17 18   Temp: 98.4 F (36.9 C) 97.6 F (36.4 C) 98.5 F (36.9 C) 98 F (36.7 C)  TempSrc: Oral Oral Oral Oral  SpO2: 99% 100% 100% 100%  Weight:   61.9 kg   Height:        Intake/Output Summary (Last 24 hours) at 08/03/2020 1054 Last data filed at 08/03/2020 0930 Gross per 24 hour  Intake 780 ml  Output --  Net 780 ml   Filed Weights   08/01/20 0700 08/02/20 0740 08/03/20 0430  Weight: 59.2 kg 62.1 kg 61.9 kg    Examination: Constitutional: NAD Eyes: no scleral icterus ENMT: Mucous membranes are moist.  Neck: normal, supple Respiratory: clear to auscultation bilaterally, no wheezing, no crackles. Normal respiratory effort. No accessory muscle use.  Cardiovascular: Regular, tachycardic.  No peripheral edema Abdomen: non distended, no tenderness. Bowel sounds positive.  Musculoskeletal: no clubbing / cyanosis.  Skin: no rashes Neurologic: CN 2-12 grossly intact. Strength 5/5 in all 4.   Data Reviewed: I have independently reviewed following labs and imaging studies   CBC: Recent Labs  Lab 07/31/20 1742 08/01/20 0442 08/03/20 0104  WBC 7.2 7.3 6.1  NEUTROABS  --   --  3.6  HGB 11.5* 13.2 11.9*  HCT 35.2* 40.2 36.2*  MCV 94.6 94.8 94.0  PLT 168 187 224   Basic Metabolic Panel: Recent Labs  Lab 07/31/20 1742 08/01/20 0442 08/01/20 0758 08/02/20 0045 08/03/20 0104  NA 136 140 140 138 136  K 4.6 4.0 3.7 4.4 4.3  CL 109 105 106 105 100  CO2 19* 25 24 26 26   GLUCOSE 149* 85 94 103* 93  BUN 20 22 22   25* 28*  CREATININE 1.27* 1.36* 1.43* 1.42* 1.53*  CALCIUM 8.6* 9.0 8.8* 8.9 9.0  MG  --   --   --   --  1.7  PHOS  --   --   --   --  5.5*   Liver Function Tests: Recent Labs  Lab 08/01/20 0758 08/02/20 0045 08/03/20 0104  AST 20 20 19   ALT 13 14 12   ALKPHOS 76 71 70  BILITOT 1.0 0.7 1.0  PROT 6.4* 6.6 6.6  ALBUMIN 3.4* 3.5 3.5   Coagulation Profile: Recent Labs  Lab 08/01/20 0442 08/01/20 0758  INR 3.7* 3.8*   HbA1C: No results for input(s): HGBA1C in the last 72 hours. CBG: No results for input(s): GLUCAP in the last 168  hours.  Recent Results (from the past 240 hour(s))  Resp Panel by RT-PCR (Flu A&B, Covid) Nasopharyngeal Swab     Status: None   Collection Time: 07/31/20  9:59 PM   Specimen: Nasopharyngeal Swab; Nasopharyngeal(NP) swabs in vial transport medium  Result Value Ref Range Status   SARS Coronavirus 2 by RT PCR NEGATIVE NEGATIVE Final    Comment: (NOTE) SARS-CoV-2 target nucleic acids are NOT DETECTED.  The SARS-CoV-2 RNA is generally detectable in upper respiratory specimens during the acute phase of infection. The lowest concentration of SARS-CoV-2 viral copies this assay can detect is 138 copies/mL. A negative result does not preclude SARS-Cov-2 infection and should not be used as the sole basis for treatment or other patient management decisions. A negative result may occur with  improper specimen collection/handling, submission of specimen other than nasopharyngeal swab, presence of viral mutation(s) within the areas targeted by this assay, and inadequate number of viral copies(<138 copies/mL). A negative result must be combined with clinical observations, patient history, and epidemiological information. The expected result is Negative.  Fact Sheet for Patients:  EntrepreneurPulse.com.au  Fact Sheet for Healthcare Providers:  IncredibleEmployment.be  This test is no t yet approved or cleared by the  [place] FDA and  has been authorized for detection and/or diagnosis of SARS-CoV-2 by FDA under an Emergency Use Authorization (EUA). This EUA will remain  in effect (meaning this test can be used) for the duration of the COVID-19 declaration under Section 564(b)(1) of the Act, 21 U.S.C.section 360bbb-3(b)(1), unless the authorization is terminated  or revoked sooner.       Influenza A by PCR NEGATIVE NEGATIVE Final   Influenza B by PCR NEGATIVE NEGATIVE Final    Comment: (NOTE) The Xpert Xpress SARS-CoV-2/FLU/RSV plus assay is intended as an aid in the diagnosis of influenza from Nasopharyngeal swab specimens and should not be used as a sole basis for treatment. Nasal washings and aspirates are unacceptable for Xpert Xpress SARS-CoV-2/FLU/RSV testing.  Fact Sheet for Patients: EntrepreneurPulse.com.au  Fact Sheet for Healthcare Providers: IncredibleEmployment.be  This test is not yet approved or cleared by the [place] FDA and has been authorized for detection and/or diagnosis of SARS-CoV-2 by FDA under an Emergency Use Authorization (EUA). This EUA will remain in effect (meaning this test can be used) for the duration of the COVID-19 declaration under Section 564(b)(1) of the Act, 21 U.S.C. section 360bbb-3(b)(1), unless the authorization is terminated or revoked.  Performed at [facility], [place] [address] [place], [place] [address]      Radiology Studies: No results found.  Marzetta Board, MD, PhD [facility]  Between 7 am - 7 pm I am available, please contact me via Amion (for emergencies) or Securechat (non urgent messages)  Between 7 pm - 7 am I am not available, please contact night coverage MD/APP via Amion

## 2020-08-04 ENCOUNTER — Other Ambulatory Visit (HOSPITAL_COMMUNITY): Payer: Self-pay

## 2020-08-04 DIAGNOSIS — I10 Essential (primary) hypertension: Secondary | ICD-10-CM | POA: Diagnosis not present

## 2020-08-04 DIAGNOSIS — I5043 Acute on chronic combined systolic (congestive) and diastolic (congestive) heart failure: Secondary | ICD-10-CM | POA: Diagnosis not present

## 2020-08-04 DIAGNOSIS — F1910 Other psychoactive substance abuse, uncomplicated: Secondary | ICD-10-CM | POA: Diagnosis not present

## 2020-08-04 DIAGNOSIS — I4892 Unspecified atrial flutter: Secondary | ICD-10-CM | POA: Diagnosis not present

## 2020-08-04 LAB — CBC WITH DIFFERENTIAL/PLATELET
Abs Immature Granulocytes: 0.02 10*3/uL (ref 0.00–0.07)
Basophils Absolute: 0.1 10*3/uL (ref 0.0–0.1)
Basophils Relative: 2 %
Eosinophils Absolute: 0.3 10*3/uL (ref 0.0–0.5)
Eosinophils Relative: 4 %
HCT: 39.8 % (ref 39.0–52.0)
Hemoglobin: 13.1 g/dL (ref 13.0–17.0)
Immature Granulocytes: 0 %
Lymphocytes Relative: 22 %
Lymphs Abs: 1.6 10*3/uL (ref 0.7–4.0)
MCH: 30.9 pg (ref 26.0–34.0)
MCHC: 32.9 g/dL (ref 30.0–36.0)
MCV: 93.9 fL (ref 80.0–100.0)
Monocytes Absolute: 0.6 10*3/uL (ref 0.1–1.0)
Monocytes Relative: 8 %
Neutro Abs: 4.5 10*3/uL (ref 1.7–7.7)
Neutrophils Relative %: 64 %
Platelets: 192 10*3/uL (ref 150–400)
RBC: 4.24 MIL/uL (ref 4.22–5.81)
RDW: 13.5 % (ref 11.5–15.5)
WBC: 7.1 10*3/uL (ref 4.0–10.5)
nRBC: 0 % (ref 0.0–0.2)

## 2020-08-04 LAB — COMPREHENSIVE METABOLIC PANEL
ALT: 11 U/L (ref 0–44)
AST: 20 U/L (ref 15–41)
Albumin: 3.8 g/dL (ref 3.5–5.0)
Alkaline Phosphatase: 68 U/L (ref 38–126)
Anion gap: 8 (ref 5–15)
BUN: 31 mg/dL — ABNORMAL HIGH (ref 8–23)
CO2: 26 mmol/L (ref 22–32)
Calcium: 9.5 mg/dL (ref 8.9–10.3)
Chloride: 103 mmol/L (ref 98–111)
Creatinine, Ser: 1.3 mg/dL — ABNORMAL HIGH (ref 0.61–1.24)
GFR, Estimated: >60 mL/min (ref >60)
Glucose, Bld: 117 mg/dL — ABNORMAL HIGH (ref 70–99)
Potassium: 4.6 mmol/L (ref 3.5–5.1)
Sodium: 137 mmol/L (ref 135–145)
Total Bilirubin: 0.5 mg/dL (ref 0.3–1.2)
Total Protein: 7 g/dL (ref 6.5–8.1)

## 2020-08-04 LAB — MAGNESIUM: Magnesium: 1.9 mg/dL (ref 1.7–2.4)

## 2020-08-04 LAB — PHOSPHORUS: Phosphorus: 4.6 mg/dL (ref 2.5–4.6)

## 2020-08-04 MED ORDER — SACUBITRIL-VALSARTAN 24-26 MG PO TABS: 1.0000 | ORAL_TABLET | Freq: Two times a day (BID) | ORAL | 0 refills | Status: DC

## 2020-08-04 MED ORDER — SPIRONOLACTONE 25 MG PO TABS
25.0000 mg | ORAL_TABLET | Freq: Every day | ORAL | 0 refills | Status: DC
  Filled 2020-08-04: qty 30, 30d supply, fill #0

## 2020-08-04 MED ORDER — RIVAROXABAN 20 MG PO TABS: 20.0000 mg | ORAL_TABLET | Freq: Every day | ORAL | Status: DC

## 2020-08-04 MED ORDER — FUROSEMIDE 20 MG PO TABS
20.0000 mg | ORAL_TABLET | Freq: Every day | ORAL | 0 refills | Status: DC
  Filled 2020-08-04: qty 30, 30d supply, fill #0

## 2020-08-04 MED ORDER — RIVAROXABAN 20 MG PO TABS
20.0000 mg | ORAL_TABLET | Freq: Every day | ORAL | 1 refills | Status: DC
  Filled 2020-08-04: qty 30, 30d supply, fill #0

## 2020-08-04 MED ORDER — SACUBITRIL-VALSARTAN 24-26 MG PO TABS
1.0000 | ORAL_TABLET | Freq: Two times a day (BID) | ORAL | 0 refills | Status: DC
Start: 2020-08-04 — End: 2020-09-19

## 2020-08-04 MED ORDER — FERROUS SULFATE 325 (65 FE) MG PO TABS
324.0000 mg | ORAL_TABLET | Freq: Every day | ORAL | 0 refills | Status: DC
Start: 2020-08-04 — End: 2021-07-21
  Filled 2020-08-04: qty 30, 30d supply, fill #0

## 2020-08-04 MED ORDER — CARVEDILOL 25 MG PO TABS
25.0000 mg | ORAL_TABLET | Freq: Two times a day (BID) | ORAL | 0 refills | Status: DC
  Filled 2020-08-04: qty 60, 30d supply, fill #0

## 2020-08-04 NOTE — Progress Notes (Signed)
Heart Failure Stewardship Pharmacist Progress Note   PCP: Patient, No Pcp Per (Inactive) PCP-Cardiologist: Donato Heinz, MD    HPI:  60 YO male with PMH significant for nonischemic cardiomyopathy, polysubstance abuse, nonobstructive CAD, bipolar disorder, atrial flutter. Presents with shortness of breath and a flutter with RVR. ECHO on 08/01/20 showed EF <20%, with severely decreased function in the left ventricle; right ventricular function normal. Of note, patient historically non-adherent to medications due to high cost. He did get approved for patient assistance program for Entresto. Patient assistance for Eliquis was previously denied in 2021 due to out-of-pocket requirements.   Discharge HF Medications: Furosemide 20 mg daily Carvedilol 25 mg BID Entresto 24/26 mg BID Spironolactone 25 mg daily  Prior to admission HF Medications: Carvedilol 25 mg BID (not taking due to cost) Furosemide 20 mg daily (not taking due to cost) Entresto 24-26 mg BID (not taking due to cost) Spironolactone 25 mg daily (not taking due to cost)  Pertinent Lab Values: . Serum creatinine 1.30, BUN 31, Potassium 4.6, Sodium 137, BNP 2128, Magnesium 1.9  Vital Signs: . Weight: 136 lbs (admission weight: 130 lbs) . Blood pressure: 110/80s . Heart rate: 120s  Medication Assistance / Insurance Benefits Check: Does the patient have prescription insurance?  Yes Type of insurance plan: Medicare [employer] - Patient has been recommended to apply for Medicaid as well   Does the patient qualify for medication assistance through manufacturers or grants?   Pending  Outpatient Pharmacy:  Prior to admission outpatient pharmacy: [facility]  Is the patient willing to use [facility] at discharge? Yes Is the patient willing to transition their outpatient pharmacy to utilize a [facility] outpatient pharmacy?   Pending    Assessment/Plan: 1) Acute on chronic systolic CHF (EF <71%), due to  polysubstance abuse. NYHA class II symptoms. - Continue furosemide 20 mg PO daily - Continue carvedilol 25 mg BID - Continue Entresto 24-26 mg BID - Continue spironolactone 25 mg daily  - Consider starting an SGLT-2 inhibitor in future if able to obtain patient assistance or dual coverage with medicaid   2) Patient assistance: - He is approved to receive Entresto for free from patient assistance program - provided patient with Manufacturer phone number to call to set up delivery to his home.  -Patient has a $200 deductible to meet, once this is met, the following copays will apply: - Jardiance Copay: $47/month - Farxiga Copay: $47/month  3)  Education  - Patient has been educated on current HF medications and potential additions to HF medication regimen  - Patient verbalizes understanding that over the next few months, these medication doses may change and more medications may be added to optimize HF regimen - Patient has been educated on basic disease state pathophysiology and goals of therapy   Kerby Nora, PharmD, BCPS Heart Failure Stewardship Pharmacist Phone [phone]

## 2020-08-04 NOTE — Progress Notes (Signed)
D/c tele and IV. Went over AVS with pt and all questions were answered. Pt received meds from TOC. Going home via transport (uber).   Lavenia Atlas, RN

## 2020-08-04 NOTE — Progress Notes (Signed)
Pt was D/C and did not receive his written prescription. After medications were filled by TOC. MD was notified and called the pt preferred pharmacy. I faxed the prescription to his pharmacy ([facility] on [address]) and let the patient know. Pt did not answer, left a message related to pickup information and provided a call back number for questions.   Chrisandra Carota, RN 08/04/2020 12:31 PM

## 2020-08-04 NOTE — Progress Notes (Addendum)
ANTICOAGULATION CONSULT NOTE - Follow Up Consult  Pharmacy Consult for Xarelto Indication: atrial fibrillation  Allergies  Allergen Reactions  . Adhesive [Tape] Other (See Comments)    Heat and EKG pads flare pre-existing eczema    Patient Measurements: Height: 5\' 10"  (177.8 cm) Weight: 61.8 kg (136 lb 4.8 oz) IBW/kg (Calculated) : 73  Vital Signs: Temp: 97.9 F (36.6 C) (05/27 0742) Temp Source: Oral (05/27 0742) BP: 111/89 (05/27 0742) Pulse Rate: 104 (05/27 0742)  Labs: Recent Labs    08/02/20 0045 08/03/20 0104 08/04/20 0158  HGB  --  11.9* 13.1  HCT  --  36.2* 39.8  PLT  --  174 192  CREATININE 1.42* 1.53* 1.30*    Estimated Creatinine Clearance: 52.2 mL/min (A) (by C-G formula based on SCr of 1.3 mg/dL (H)).  Assessment: 60 year old male on Eliquis prior to admission, now switched to [place] on Xarelto 20 mg daily  The [facility] pharmacy is able to use the copay card so he will get the first 30 days of Xarelto for free. Next month he will still be faced with the 200 deductible - total $247 for the Xarelto -- We have checked with the caseworker here in the hospital -- there is not a way for the hospital to pay for this. Will leave for clinic follow up.   Delivered patient assistance program paper work to him.   Plan:  Home with Xarelto 20 mg po daily Patient to call regarding Entresto - through patient assistance program  Tad Moore 08/04/2020,9:36 AM

## 2020-08-04 NOTE — Discharge Summary (Signed)
Physician Discharge Summary  Austin Cobb WEX:[id number] DOB: 03/19/1960 DOA: 07/31/2020  PCP: Patient, No Pcp Per (Inactive)  Admit date: 07/31/2020 Discharge date: 08/04/2020  Admitted From: home Disposition:  home  Recommendations for Outpatient Follow-up:  1. Follow up with PCP in 1-2 weeks 2. Follow-up with cardiology in office as scheduled  Home Health: none Equipment/Devices: none  Discharge Condition: stable CODE STATUS: Full code Diet recommendation: low sodium  HPI: Per admitting MD, Austin Cobb is a 60 y.o. male with medical history significant for combined systolic and diastolic heart failure, hypertension, atrial flutter on Eliquis and history of heparin-induced thrombocytopenia who presents with concerns of chest tightness, increasing fatigue since 4 days ago. He has not been taking any of his medication for at least 9 months due to financial issues.  He states that Eliquis and Entresto cost him about $600 a month and he cannot afford that.  He also did not take any of his other medications since he figures they will not help much if he cannot afford the other 2 medications.  About 4 days ago he woke up with increasing chest tightness, increasing fatigue and shortness of breath with exertion. He endorsed alcohol use of about 42 ounce of beer daily.  Also used cocaine last night.  Also smokes about half a pack per day. In the ED, he was found to be in atrial flutter 3-1 conduction with rates up to 130s.  Blood pressure elevated systolic 938B over 017.  BNP > 2000.  Chest x-ray shows mild bilateral pleural effusion.  He was given 5 mg of Lopressor and started on diltiazem drip.  Hospital Course / Discharge diagnoses: Principal Problem Symptomatic a flutter with RVR-cardiology consulted and following.  Goal right now is rate controlled and anticoagulation.  Currently on Coreg, dose has been increased today to 25 twice daily.  Continue anticoagulation with Xarelto.  He  is rate is controlled satisfactory, discussed with cardiology, he is cleared to go home and will be discharged in stable condition.  He will have outpatient follow-up with cardiology within the next couple of weeks.  Active Problems Acute on chronic systolic CHF-most recent 2D echo showed an EF less than 20%.  Nonadherence is a big issue here, patient was unable to afford few of his medication including Eliquis as well as Entresto due to co-pays to make his deductible.  Were able to provide patient with Xarelto, Coreg, furosemide, spironolactone for 30 days and he will reach out to patient's assistance for [facility] as he did last year and had it for free Polysubstance abuse-UDS was positive for cocaine, he is using alcohol daily.  He tells me he will be unable to quit alcohol because it is in his DNA as his mother was drinking when she was pregnant with him.  He is not willing to discuss this further.  Patient counseled for substance abuse cessation on the day of discharge again Elevated troponin-likely demand, in the setting of A. fib with RVR Essential hypertension-continue Coreg, furosemide, Entresto, spironolactone  Sepsis ruled out   Discharge Instructions   Allergies as of 08/04/2020      Reactions   Adhesive [tape] Other (See Comments)   Heat and EKG pads flare pre-existing eczema      Medication List    STOP taking these medications   Eliquis 5 MG Tabs tablet Generic drug: apixaban     TAKE these medications   carvedilol 25 MG tablet Commonly known as: COREG Take 1 tablet (  25 mg total) by mouth 2 (two) times daily with a meal.   cholecalciferol 25 MCG (1000 UNIT) tablet Commonly known as: VITAMIN D3 Take 2,000 Units by mouth daily.   feeding supplement Liqd Take 237 mLs by mouth in the morning, at noon, in the evening, and at bedtime.   ferrous sulfate 324 MG Tbec Take 1 tablet (324 mg total) by mouth daily with breakfast.   furosemide 20 MG tablet Commonly known  as: LASIX Take 1 tablet (20 mg total) by mouth daily.   lactulose 10 GM/15ML solution Commonly known as: CHRONULAC Take 30 mLs (20 g total) by mouth 2 (two) times daily.   multivitamin with minerals Tabs tablet Take 1 tablet by mouth daily.   rivaroxaban 20 MG Tabs tablet Commonly known as: XARELTO Take 1 tablet (20 mg total) by mouth daily with supper.   sacubitril-valsartan 24-26 MG Commonly known as: ENTRESTO Take 1 tablet by mouth 2 (two) times daily.   spironolactone 25 MG tablet Commonly known as: ALDACTONE Take 1 tablet (25 mg total) by mouth daily.       Consultations:  Cardiology   Procedures/Studies:  None   DG Chest 2 View  Result Date: 07/31/2020 CLINICAL DATA:  60 year old male with chest pain. EXAM: CHEST - 2 VIEW COMPARISON:  Chest radiograph dated 12/28/2019. FINDINGS: There is stable mild cardiomegaly. Small bilateral pleural effusions, slightly increased since the prior radiograph. No focal consolidation or pneumothorax. Degenerative changes of the spine. No acute osseous pathology. IMPRESSION: Small bilateral pleural effusions, increased since the prior radiograph. Electronically Signed   By: Anner Crete M.D.   On: 07/31/2020 18:35   ECHOCARDIOGRAM LIMITED  Result Date: 08/01/2020    ECHOCARDIOGRAM LIMITED REPORT   Patient Name:   Austin Cobb Date of Exam: 08/01/2020 Medical Rec #:  [id number]         Height:       70.0 in Accession #:    [id number]        Weight:       130.5 lb Date of Birth:  01/07/1961          BSA:          1.741 m Patient Age:    39 years          BP:           149/107 mmHg Patient Gender: M                 HR:           131 bpm. Exam Location:  Inpatient Procedure: Limited Echo and Limited Color Doppler Indications:    I50.42 Chronic combined systolic (congestive) and diastolic                 (congestive) heart failure  History:        Patient has prior history of Echocardiogram examinations, most                 recent  12/25/2019. Risk Factors:Current Smoker. Polysubstance                 abuse. GERD.  Sonographer:    Jonelle Sidle Dance Referring Phys: [id number] Talmo  1. Left ventricular ejection fraction, by estimation, is <20%. The left ventricle has severely decreased function. The left ventricle demonstrates global hypokinesis. There is moderate left ventricular hypertrophy of the posterior segment.  2. Right ventricular systolic function is normal. The right ventricular size is mildly enlarged.  3. Left atrial size was moderately dilated.  4. The pericardial effusion is posterior to the left ventricle.  5. The mitral valve is normal in structure. Mild to moderate mitral valve regurgitation. No evidence of mitral stenosis.  6. The aortic valve is normal in structure. Aortic valve regurgitation is not visualized. No aortic stenosis is present.  7. The inferior vena cava is dilated in size with <50% respiratory variability, suggesting right atrial pressure of 15 mmHg. Comparison(s): Prior EF 25-30%. FINDINGS  Left Ventricle: Left ventricular ejection fraction, by estimation, is <20%. The left ventricle has severely decreased function. The left ventricle demonstrates global hypokinesis. The left ventricular internal cavity size was normal in size. There is moderate left ventricular hypertrophy of the posterior segment. Right Ventricle: The right ventricular size is mildly enlarged. No increase in right ventricular wall thickness. Right ventricular systolic function is normal. Left Atrium: Left atrial size was moderately dilated. Right Atrium: Right atrial size was normal in size. Pericardium: Trivial pericardial effusion is present. The pericardial effusion is posterior to the left ventricle. Mitral Valve: The mitral valve is normal in structure. There is moderate thickening of the mitral valve leaflet(s). Mild to moderate mitral valve regurgitation. No evidence of mitral valve stenosis. Tricuspid Valve:  The tricuspid valve is normal in structure. Tricuspid valve regurgitation is mild . No evidence of tricuspid stenosis. Aortic Valve: The aortic valve is normal in structure. Aortic valve regurgitation is not visualized. No aortic stenosis is present. Pulmonic Valve: The pulmonic valve was normal in structure. Pulmonic valve regurgitation is mild. No evidence of pulmonic stenosis. Aorta: The aortic root is normal in size and structure. Venous: The inferior vena cava is dilated in size with less than 50% respiratory variability, suggesting right atrial pressure of 15 mmHg. IAS/Shunts: No atrial level shunt detected by color flow Doppler. LEFT VENTRICLE PLAX 2D LVIDd:         5.10 cm LVIDs:         4.70 cm LV PW:         1.80 cm LV IVS:        1.00 cm LVOT diam:     2.00 cm LVOT Area:     3.14 cm  RIGHT VENTRICLE RV Basal diam:  4.10 cm RV Mid diam:    3.10 cm LEFT ATRIUM              Index       RIGHT ATRIUM           Index LA diam:        4.30 cm  2.47 cm/m  RA Area:     21.80 cm LA Vol (A2C):   122.0 ml 70.07 ml/m RA Volume:   70.90 ml  40.72 ml/m LA Vol (A4C):   68.1 ml  39.12 ml/m LA Biplane Vol: 92.7 ml  53.24 ml/m   AORTA Ao Root diam: 3.30 cm Ao Asc diam:  2.80 cm  SHUNTS Systemic Diam: 2.00 cm Candee Furbish MD Electronically signed by Candee Furbish MD Signature Date/Time: 08/01/2020/3:01:00 PM    Final      Subjective: - no chest pain, shortness of breath, no abdominal pain, nausea or vomiting.   Discharge Exam: BP 111/89 (BP Location: Right Arm)   Pulse (!) 104   Temp 97.9 F (36.6 C) (Oral)   Resp 17   Ht 5\' 10"  (1.778 m)   Wt 61.8 kg   SpO2 98%   BMI 19.56 kg/m   General: Pt is alert,  awake, not in acute distress Cardiovascular: irregular Respiratory: CTA bilaterally, no wheezing, no rhonchi Abdominal: Soft, NT, ND, bowel sounds + Extremities: no edema, no cyanosis  The results of significant diagnostics from this hospitalization (including imaging, microbiology, ancillary and  laboratory) are listed below for reference.    Microbiology: Recent Results (from the past 240 hour(s))  Resp Panel by RT-PCR (Flu A&B, Covid) Nasopharyngeal Swab     Status: None   Collection Time: 07/31/20  9:59 PM   Specimen: Nasopharyngeal Swab; Nasopharyngeal(NP) swabs in vial transport medium  Result Value Ref Range Status   SARS Coronavirus 2 by RT PCR NEGATIVE NEGATIVE Final    Comment: (NOTE) SARS-CoV-2 target nucleic acids are NOT DETECTED.  The SARS-CoV-2 RNA is generally detectable in upper respiratory specimens during the acute phase of infection. The lowest concentration of SARS-CoV-2 viral copies this assay can detect is 138 copies/mL. A negative result does not preclude SARS-Cov-2 infection and should not be used as the sole basis for treatment or other patient management decisions. A negative result may occur with  improper specimen collection/handling, submission of specimen other than nasopharyngeal swab, presence of viral mutation(s) within the areas targeted by this assay, and inadequate number of viral copies(<138 copies/mL). A negative result must be combined with clinical observations, patient history, and epidemiological information. The expected result is Negative.  Fact Sheet for Patients:  EntrepreneurPulse.com.au  Fact Sheet for Healthcare Providers:  IncredibleEmployment.be  This test is no t yet approved or cleared by the [place] FDA and  has been authorized for detection and/or diagnosis of SARS-CoV-2 by FDA under an Emergency Use Authorization (EUA). This EUA will remain  in effect (meaning this test can be used) for the duration of the COVID-19 declaration under Section 564(b)(1) of the Act, 21 U.S.C.section 360bbb-3(b)(1), unless the authorization is terminated  or revoked sooner.       Influenza A by PCR NEGATIVE NEGATIVE Final   Influenza B by PCR NEGATIVE NEGATIVE Final    Comment: (NOTE) The  Xpert Xpress SARS-CoV-2/FLU/RSV plus assay is intended as an aid in the diagnosis of influenza from Nasopharyngeal swab specimens and should not be used as a sole basis for treatment. Nasal washings and aspirates are unacceptable for Xpert Xpress SARS-CoV-2/FLU/RSV testing.  Fact Sheet for Patients: EntrepreneurPulse.com.au  Fact Sheet for Healthcare Providers: IncredibleEmployment.be  This test is not yet approved or cleared by the [place] FDA and has been authorized for detection and/or diagnosis of SARS-CoV-2 by FDA under an Emergency Use Authorization (EUA). This EUA will remain in effect (meaning this test can be used) for the duration of the COVID-19 declaration under Section 564(b)(1) of the Act, 21 U.S.C. section 360bbb-3(b)(1), unless the authorization is terminated or revoked.  Performed at [facility], [place] [address], [place], [place] [address]      Labs: Basic Metabolic Panel: Recent Labs  Lab 08/01/20 0442 08/01/20 0758 08/02/20 0045 08/03/20 0104 08/04/20 0158  NA 140 140 138 136 137  K 4.0 3.7 4.4 4.3 4.6  CL 105 106 105 100 103  CO2 25 24 26 26 26   GLUCOSE 85 94 103* 93 117*  BUN 22 22 25* 28* 31*  CREATININE 1.36* 1.43* 1.42* 1.53* 1.30*  CALCIUM 9.0 8.8* 8.9 9.0 9.5  MG  --   --   --  1.7 1.9  PHOS  --   --   --  5.5* 4.6   Liver Function Tests: Recent Labs  Lab 08/01/20 0758 08/02/20  0045 08/03/20 0104 08/04/20 0158  AST 20 20 19 20   ALT 13 14 12 11   ALKPHOS 76 71 70 68  BILITOT 1.0 0.7 1.0 0.5  PROT 6.4* 6.6 6.6 7.0  ALBUMIN 3.4* 3.5 3.5 3.8   CBC: Recent Labs  Lab 07/31/20 1742 08/01/20 0442 08/03/20 0104 08/04/20 0158  WBC 7.2 7.3 6.1 7.1  NEUTROABS  --   --  3.6 4.5  HGB 11.5* 13.2 11.9* 13.1  HCT 35.2* 40.2 36.2* 39.8  MCV 94.6 94.8 94.0 93.9  PLT 168 187 174 192   CBG: No results for input(s): GLUCAP in the last 168 hours. Hgb A1c No results for input(s): HGBA1C in the  last 72 hours. Lipid Profile No results for input(s): CHOL, HDL, LDLCALC, TRIG, CHOLHDL, LDLDIRECT in the last 72 hours. Thyroid function studies No results for input(s): TSH, T4TOTAL, T3FREE, THYROIDAB in the last 72 hours.  Invalid input(s): FREET3 Urinalysis    Component Value Date/Time   COLORURINE AMBER (A) 12/28/2019 1656   APPEARANCEUR CLOUDY (A) 12/28/2019 1656   LABSPEC 1.016 12/28/2019 1656   PHURINE 8.0 12/28/2019 1656   GLUCOSEU NEGATIVE 12/28/2019 1656   HGBUR NEGATIVE 12/28/2019 1656   BILIRUBINUR NEGATIVE 12/28/2019 1656   KETONESUR NEGATIVE 12/28/2019 1656   PROTEINUR NEGATIVE 12/28/2019 1656   NITRITE POSITIVE (A) 12/28/2019 1656   LEUKOCYTESUR LARGE (A) 12/28/2019 1656    FURTHER DISCHARGE INSTRUCTIONS:   Get Medicines reviewed and adjusted: Please take all your medications with you for your next visit with your Primary MD   Laboratory/radiological data: Please request your Primary MD to go over all hospital tests and procedure/radiological results at the follow up, please ask your Primary MD to get all Hospital records sent to his/her office.   In some cases, they will be blood work, cultures and biopsy results pending at the time of your discharge. Please request that your primary care M.D. goes through all the records of your hospital data and follows up on these results.   Also Note the following: If you experience worsening of your admission symptoms, develop shortness of breath, life threatening emergency, suicidal or homicidal thoughts you must seek medical attention immediately by calling 911 or calling your MD immediately  if symptoms less severe.   You must read complete instructions/literature along with all the possible adverse reactions/side effects for all the Medicines you take and that have been prescribed to you. Take any new Medicines after you have completely understood and accpet all the possible adverse reactions/side effects.    Do not  drive when taking Pain medications or sleeping medications (Benzodaizepines)   Do not take more than prescribed Pain, Sleep and Anxiety Medications. It is not advisable to combine anxiety,sleep and pain medications without talking with your primary care practitioner   Special Instructions: If you have smoked or chewed Tobacco  in the last 2 yrs please stop smoking, stop any regular Alcohol  and or any Recreational drug use.   Wear Seat belts while driving.   Please note: You were cared for by a hospitalist during your hospital stay. Once you are discharged, your primary care physician will handle any further medical issues. Please note that NO REFILLS for any discharge medications will be authorized once you are discharged, as it is imperative that you return to your primary care physician (or establish a relationship with a primary care physician if you do not have one) for your post hospital discharge needs so that they can reassess your need  for medications and monitor your lab values.  Time coordinating discharge: 35 minutes  SIGNED:  Marzetta Board, MD, PhD 08/04/2020, 9:35 AM

## 2020-08-04 NOTE — Progress Notes (Signed)
Updated:  Pt called back. Gave pt information about where to pickup the med and emphasized the importance of taking med. Pt verbalized understanding.   Lavenia Atlas, RN

## 2020-08-04 NOTE — Progress Notes (Signed)
Progress Note  Patient Name: Austin Cobb Date of Encounter: 08/04/2020  [facility] Cardiologist: Donato Heinz, MD   Subjective   BP 111/89 this morning.  Creatinine 1.30 from 1.53.  Denies any chest pain, dyspnea.  Inpatient Medications    Scheduled Meds: . bisacodyl  5 mg Oral BID  . carvedilol  25 mg Oral BID WC  . folic acid  1 mg Oral Daily  . furosemide  20 mg Oral Daily  . multivitamin with minerals  1 tablet Oral Daily  . nicotine  21 mg Transdermal Daily  . Rivaroxaban  15 mg Oral Q supper  . sacubitril-valsartan  1 tablet Oral BID  . spironolactone  25 mg Oral Daily  . thiamine  100 mg Oral Daily   Continuous Infusions:  PRN Meds: calcium carbonate   Vital Signs    Vitals:   08/03/20 2317 08/04/20 0116 08/04/20 0337 08/04/20 0742  BP: 110/84 93/75 110/90 111/89  Pulse: (!) 124 (!) 107 (!) 106 (!) 104  Resp: (!) 21 20 18 17   Temp: (!) 97.5 F (36.4 C) 97.9 F (36.6 C) 97.8 F (36.6 C) 97.9 F (36.6 C)  TempSrc: Oral Oral Oral Oral  SpO2: 100% 100% 99% 98%  Weight:   61.8 kg   Height:        Intake/Output Summary (Last 24 hours) at 08/04/2020 0815 Last data filed at 08/04/2020 0330 Gross per 24 hour  Intake 1044 ml  Output --  Net 1044 ml   Last 3 Weights 08/04/2020 08/03/2020 08/02/2020  Weight (lbs) 136 lb 4.8 oz 136 lb 7.4 oz 136 lb 14.5 oz  Weight (kg) 61.825 kg 61.9 kg 62.1 kg      Telemetry    AFL in the 90s to 120s- Personally Reviewed  ECG    No new ECG- Personally Reviewed  Physical Exam   GEN: No acute distress.   Neck: No JVD Cardiac: tachycardic, regular no murmurs Respiratory: Clear to auscultation bilaterally. GI: Soft, nontender, non-distended  MS: No edema Neuro:  Nonfocal  Psych: Normal affect   Labs    High Sensitivity Troponin:   Recent Labs  Lab 07/31/20 1742 07/31/20 1949  TROPONINIHS 50* 49*      Chemistry Recent Labs  Lab 08/02/20 0045 08/03/20 0104 08/04/20 0158  NA 138 136  137  K 4.4 4.3 4.6  CL 105 100 103  CO2 26 26 26   GLUCOSE 103* 93 117*  BUN 25* 28* 31*  CREATININE 1.42* 1.53* 1.30*  CALCIUM 8.9 9.0 9.5  PROT 6.6 6.6 7.0  ALBUMIN 3.5 3.5 3.8  AST 20 19 20   ALT 14 12 11   ALKPHOS 71 70 68  BILITOT 0.7 1.0 0.5  GFRNONAA 56* 51* >60  ANIONGAP 7 10 8      Hematology Recent Labs  Lab 08/01/20 0442 08/03/20 0104 08/04/20 0158  WBC 7.3 6.1 7.1  RBC 4.24 3.85* 4.24  HGB 13.2 11.9* 13.1  HCT 40.2 36.2* 39.8  MCV 94.8 94.0 93.9  MCH 31.1 30.9 30.9  MCHC 32.8 32.9 32.9  RDW 14.0 13.8 13.5  PLT 187 174 192    BNP Recent Labs  Lab 07/31/20 1804  BNP 2,128.0*     DDimer No results for input(s): DDIMER in the last 168 hours.   Radiology    No results found.  Cardiac Studies   Echo 08/01/20: 1. Left ventricular ejection fraction, by estimation, is <20%. The left  ventricle has severely decreased function. The left  ventricle demonstrates  global hypokinesis. There is moderate left ventricular hypertrophy of the  posterior segment.  2. Right ventricular systolic function is normal. The right ventricular  size is mildly enlarged.  3. Left atrial size was moderately dilated.  4. The pericardial effusion is posterior to the left ventricle.  5. The mitral valve is normal in structure. Mild to moderate mitral valve  regurgitation. No evidence of mitral stenosis.  6. The aortic valve is normal in structure. Aortic valve regurgitation is  not visualized. No aortic stenosis is present.  7. The inferior vena cava is dilated in size with <50% respiratory  variability, suggesting right atrial pressure of 15 mmHg.   Patient Profile     60 y.o. male with a history of nonischemic cardiomyopathy attributed to polysubstance use disorder, nonobstructive CAD, bipolar disorder, paroxysmal atrial flutter who presents with shortness of breath and atrial flutter with rapid ventricular response  Assessment & Plan    Acute on chronic systolic  and diastolic heart failure: Nonischemic cardiomyopathy, LVEF <20%.  Cath on 05/18/19 showed nonobstructive CAD.  Suspect NICM due to polysubstance abuse vs tachycardia induced cardiomyopathy. - Continue carvedilol 25 mg twice daily - Continue Entresto 24-26 mg twice daily, will be free to patient per pharmacy - Continue spironolactone 25 mg daily - Continue PO lasix 20 mg daily, appears euvolemic  Recurrent atrial flutter with 2-1 conduction not on anticoagulation - Discussed options with pharmacy.  Was given xarelto on admission but then also lovenox and warfarin ordered.  I do not think warfarin will be a good option for him.  When I brought up going to appointments for INR checks, he said he would not do that.  Recommend xarelto and trying to get patient assistance.  Per pharmacy, copay will be $47  - Hold off on TEE/DCCV for now, as he is not a good candidate due to noncompliance.  He did not take meds or followup after his admission in 05/2019, and on presentation in 12/2019 cardioversion was deferred given noncompliance.  He did not take meds or follow-up after that hospitalizaion.  Would recommend rate control strategy for now and cardioversion in 3 weeks if compliant with meds and follow-up  Hypertension: markedly hypertensive on admission, improved with restarting PO meds.  Continue coreg, entresto, and aldactone  Nonobstructive coronary artery disease - Would prefer statin, but forego for now in favor of simplifed medical regimen and cost.  CKD 3 - Monitor renal function   Medication nonadherence - Discussed 4 dollar list, medical regimen optimized for cost and benefit, and how essential adherence is to recovery  Polysubstance use: UDS positive for cocaine  [facility] will sign off.   Medication Recommendations: Carvedilol 25 mg twice daily, Lasix 20 mg daily, Xarelto 15 mg daily, Entresto 24-26 mg twice daily, spironolactone 25 mg daily Other recommendations (labs, testing,  etc):  BMET in 1 week Follow up as an outpatient: Will schedule outpatient follow-up for 3 weeks.  If compliant with medications/follow-up, will schedule cardioversion at that time    For questions or updates, please contact [place] Please consult [email or web address] for contact info under        Signed, Donato Heinz, MD  08/04/2020, 8:15 AM

## 2020-08-04 NOTE — Care Management Important Message (Signed)
Important Message  Patient Details  Name: Austin Cobb MRN: [id number] Date of Birth: 02/26/1961   Medicare Important Message Given:  Yes   Patient left prior to IM delivery will mail to the patient home address.  Amandalynn Pitz 08/04/2020, 1:18 PM

## 2020-08-10 ENCOUNTER — Encounter (HOSPITAL_COMMUNITY): Payer: Self-pay

## 2020-08-10 ENCOUNTER — Ambulatory Visit (HOSPITAL_COMMUNITY)
Admit: 2020-08-10 | Discharge: 2020-08-10 | Disposition: A | Discharge status: Home / Self Care | Payer: Medicare (Managed Care) | Attending: Internal Medicine | Admitting: Internal Medicine

## 2020-08-10 ENCOUNTER — Other Ambulatory Visit: Payer: Self-pay

## 2020-08-10 VITALS — BP 122/82 | HR 118 | Wt 143.2 lb

## 2020-08-10 DIAGNOSIS — Z79899 Other long term (current) drug therapy: Secondary | ICD-10-CM | POA: Diagnosis not present

## 2020-08-10 DIAGNOSIS — I4892 Unspecified atrial flutter: Secondary | ICD-10-CM | POA: Diagnosis not present

## 2020-08-10 DIAGNOSIS — F1410 Cocaine abuse, uncomplicated: Secondary | ICD-10-CM | POA: Diagnosis not present

## 2020-08-10 DIAGNOSIS — Z7901 Long term (current) use of anticoagulants: Secondary | ICD-10-CM | POA: Diagnosis not present

## 2020-08-10 DIAGNOSIS — F17210 Nicotine dependence, cigarettes, uncomplicated: Secondary | ICD-10-CM | POA: Diagnosis not present

## 2020-08-10 DIAGNOSIS — F10129 Alcohol abuse with intoxication, unspecified: Secondary | ICD-10-CM | POA: Diagnosis not present

## 2020-08-10 DIAGNOSIS — I5042 Chronic combined systolic (congestive) and diastolic (congestive) heart failure: Secondary | ICD-10-CM | POA: Insufficient documentation

## 2020-08-10 DIAGNOSIS — F1020 Alcohol dependence, uncomplicated: Secondary | ICD-10-CM

## 2020-08-10 DIAGNOSIS — F1490 Cocaine use, unspecified, uncomplicated: Secondary | ICD-10-CM | POA: Diagnosis not present

## 2020-08-10 DIAGNOSIS — N1832 Chronic kidney disease, stage 3b: Secondary | ICD-10-CM | POA: Insufficient documentation

## 2020-08-10 DIAGNOSIS — F319 Bipolar disorder, unspecified: Secondary | ICD-10-CM | POA: Diagnosis not present

## 2020-08-10 DIAGNOSIS — I2510 Atherosclerotic heart disease of native coronary artery without angina pectoris: Secondary | ICD-10-CM | POA: Diagnosis not present

## 2020-08-10 DIAGNOSIS — I428 Other cardiomyopathies: Secondary | ICD-10-CM | POA: Insufficient documentation

## 2020-08-10 LAB — BASIC METABOLIC PANEL
Anion gap: 9 (ref 5–15)
BUN: 34 mg/dL — ABNORMAL HIGH (ref 8–23)
CO2: 28 mmol/L (ref 22–32)
Calcium: 8.9 mg/dL (ref 8.9–10.3)
Chloride: 102 mmol/L (ref 98–111)
Creatinine, Ser: 1.86 mg/dL — ABNORMAL HIGH (ref 0.61–1.24)
GFR, Estimated: 41 mL/min — ABNORMAL LOW (ref >60)
Glucose, Bld: 70 mg/dL (ref 70–99)
Potassium: 5 mmol/L (ref 3.5–5.1)
Sodium: 139 mmol/L (ref 135–145)

## 2020-08-10 MED ORDER — DAPAGLIFLOZIN PROPANEDIOL 10 MG PO TABS: 10.0000 mg | ORAL_TABLET | Freq: Every day | ORAL | 6 refills | Status: DC

## 2020-08-10 NOTE — Progress Notes (Addendum)
[facility] Transitions of Care Clinic  PCP: none Primary Cardiologist: Oswaldo Milian  HPI:  Austin Cobb is a 60 y.o.  male  with a PMH significant for polysubstance use: tobacco, ETOH,  cocaine and THC use, bipolar disorder, Systolic CHF, NICM, Atrial flutter, Cirrhosis, mild non obstructive CAD   Cardiac history began on 05/04/2019 when he  presented to [facility] on 2/23 w/complaints of new exertional dyspnea, fatigue and decreased exercise tolerance. No chest pain. He was found to be markedly hypertensive in the ED, BP 165/119 and tachycardic w/ HR in the 130s. EKG showed 2:1 atrial flutter. Pertinent labs/test: COVID negative. UDS negative, HIV negtaive, Hs Trop 48>>50. BNP 2,060. Lactic acid 2.1>>1.7. K 4.1, Mg 1.6, SCr 1.25, TSH 5.4, Free T4 1.7, AST 66, ALT 46.  ECHO with EF <10%, moderately reduced RV function.  Did briefly require inotropic support as cr was climbing with diuresis.  DCCV was attempted but when he was started on milrinone he went back into a flutter cardioversion was attempted again on 05/08/2019 while on amiodarone.  This was complicated by him going through alcohol withdrawal during the admission.    Had [facility] 05/2019 with low filling pressures, preserved cardiac output and minimal non obstructive CAD.  His cardiomyopathy was thought to be either due to heavy alcohol use vs tachycardia mediated from atrial flutter or a combination.    Unfortunately never followed up after this in the outpatient setting, multiple attempts were made to establish with AHF but patient missed all appointments.  He was however seen in the ED a month later for alcohol intoxication but did not require admission.    Readmitted 12/2019 for acute CHF, atrial flutter. Weight largely unchanged during admission.  Restarted on CHF therapies, rate controlled and discharged.    07/31/2020 was off all medications for 9 months and cited financial issues as well as thinking some of them  don't work.  Still drinking heavily, also cocaine positive on UDS on admission, smoking 1/2ppd.  In the ED, he was found to be in atrial flutter 3-1 conduction with rates up to 130s.  Blood pressure elevated systolic 315Q over 008.  BNP > 2000.  Chest x-ray shows mild bilateral pleural effusion.  He was given 5 mg of Lopressor and started on diltiazem drip. Cardiology was consulted.  Repeat ECHO 08/01/20 with EF <20%, mild-mod MR, mild TR, moderately reduced RV function(my read).   started on carvedilol uptitrated to max dose switched to PO lasix 20mg  after one initial dose of 40mg  IV.  Started on low dose entresto.  Discharge weight similar to admission at 136lbs.    Having some trouble functioning around the house, getting short of breath with minimal exertion.  Sometimes experiences orthopnea and PND.  Gets short of breath walking from the bedroom to the kitchen.  Has cut back on alcohol significantly but says he can't quantify.  Has cut back smoking to 5 cigs daily.     ROS: All systems negative except as listed in HPI, PMH and Problem List.  SH:  Social History   Socioeconomic History  . Marital status: Divorced    Spouse name: Not on file  . Number of children: Not on file  . Years of education: Not on file  . Highest education level: Not on file  Occupational History  . Not on file  Tobacco Use  . Smoking status: Current Every Day Smoker    Packs/day: 1.00    Years: 35.00  Pack years: 35.00    Types: Cigarettes  . Smokeless tobacco: Never Used  Substance and Sexual Activity  . Alcohol use: Yes    Alcohol/week: 4.0 standard drinks    Types: 4 Cans of beer per week    Comment: 4 cans of beer a day   . Drug use: Yes    Types: Cocaine, Marijuana    Comment: Quit x6 yrs ago-   . Sexual activity: Never  Other Topics Concern  . Not on file  Social History Narrative  . Not on file   Social Determinants of Health   Financial Resource Strain: Low Risk   . Difficulty of Paying  Living Expenses: Not very hard  Food Insecurity: No Food Insecurity  . Worried About Charity fundraiser in the Last Year: Never true  . Ran Out of Food in the Last Year: Never true  Transportation Needs: Unmet Transportation Needs  . Lack of Transportation (Medical): No  . Lack of Transportation (Non-Medical): Yes  Physical Activity: Not on file  Stress: Not on file  Social Connections: Not on file  Intimate Partner Violence: Not on file    FH: No family history on file.  Past Medical History:  Diagnosis Date  . Anxiety   . Arthritis   . Bipolar 1 disorder ([place])   . Depression    Bipolar, not on meds/pt. stopped meds on own over 12 yrs ago  . GERD (gastroesophageal reflux disease)   . History of colon polyps   . Manic disorder ([place])   . Noncompliance 05/14/2019  . Substance abuse ([place])    101yrs ago smoke crack and marjuana  . Tobacco abuse 05/13/2012    Current Outpatient Medications  Medication Sig Dispense Refill  . carvedilol (COREG) 25 MG tablet Take 1 tablet (25 mg total) by mouth 2 (two) times daily with a meal. 60 tablet 0  . cholecalciferol (VITAMIN D3) 25 MCG (1000 UNIT) tablet Take 2,000 Units by mouth daily.    . feeding supplement, ENSURE ENLIVE, (ENSURE ENLIVE) LIQD Take 237 mLs by mouth in the morning, at noon, in the evening, and at bedtime. 237 mL 12  . ferrous sulfate 325 (65 FE) MG tablet Take 1 tablet (324 mg total) by mouth daily with breakfast. 30 tablet 0  . furosemide (LASIX) 20 MG tablet Take 1 tablet (20 mg total) by mouth daily. 30 tablet 0  . lactulose (CHRONULAC) 10 GM/15ML solution Take 30 mLs (20 g total) by mouth 2 (two) times daily. 236 mL 0  . Multiple Vitamin (MULTIVITAMIN WITH MINERALS) TABS tablet Take 1 tablet by mouth daily.    . rivaroxaban (XARELTO) 20 MG TABS tablet Take 1 tablet (20 mg total) by mouth daily with supper. 30 tablet 1  . sacubitril-valsartan (ENTRESTO) 24-26 MG Take 1 tablet by mouth 2 (two) times daily. 60 tablet 0  .  spironolactone (ALDACTONE) 25 MG tablet Take 1 tablet (25 mg total) by mouth daily. 30 tablet 0   No current facility-administered medications for this encounter.    Vitals:   08/10/20 1609  BP: 122/82  Pulse: (!) 118  SpO2: 99%  Weight: 65 kg (143 lb 3.2 oz)    PHYSICAL EXAM: Cardiac: JVD flat, tachycardic with normal rhythm, clear s1 and s2, no murmurs, rubs or gallops, no LE edema Pulmonary: CTAB, not in distress Abdominal: non distended abdomen, soft and nontender Psych: Alert, conversant, in good spirits   ECG   Aflutter rate 118, LVH  ASSESSMENT &  PLAN:    Chronic Systolic and diastolic CHF:  -Nonischemic cardiomyopathy   -Echo 04/2019 with EF 15%, severe RV dysfunction. Thought to be due to long-standing heavy ETOH use, cocaine use vs tachycardia induced.  Cath 05/18/19 showed no significant CAD. He had DCCV but recurrence of atrial flutter required milrinone support briefly -poor medication adherence since then, continued substance use -repeat ECHO 08/01/20 with EF <20%, mild-mod MR, mild TR, moderately reduced RV function(my read) -NYHA class 3b symptoms, dry on examination -Currently on entresto 24/26 BID, spiro 25 daily, carvedilol 25 BID, lasix 20 daily -plan was to add farxiga 10 and stop lasix however labs returned with worsened cr and increased potassium at 5.  Called the patient and discussed stopping spiro and lasix temporarily, this may be able to be started back at lower dose at his follow up visit.  -Likely most of his symptoms are from poor cardiac function and atrial flutter more than fluid overload  Alcohol Use Disorder: -says he has cut back significantly however would not quantify this for me -tells me he doesn't have the taste for it anymore  Cocaine use Disorder: -discussed cardiac toxicity 2/2 cocaine and need for cessation  Persistent Atrial Flutter: -taking xarelto daily, no s/s of bleeding -Dr. Gardiner Rhyme recommended outpatient DCCV in around 3  weeks if taking xarelto appropriately and keeping follow up appointments.  Patient says he's motivated this time around  CKD 3b: -check bmp      Follow up with general cardiology 08/18/20

## 2020-08-10 NOTE — Progress Notes (Signed)
Pt given 30-day free card for [facility] Glade, he signed pt assist application and will take it to his appt with Dr Gardiner Rhyme 6/10 to be completed and signed, pt has not received Entresto from [employer] yet, we will call them tomorrow to f/u on that, provided pt samples.  Medication Samples have been provided to the patient.  Drug name: [name]       Strength: 24/26mg         Qty: 2  LOT: [id number]  Exp.Date: 4/24  Dosing instructions: take 1 tab Twice daily   The patient has been instructed regarding the correct time, dose, and frequency of taking this medication, including desired effects and most common side effects.   Nira Conn Noemi Ishmael 5:27 PM 08/10/2020    Patient also expresses desire to speak with CSW regarding finances. Referral placed, advised pt they will call him to follow up

## 2020-08-10 NOTE — Patient Instructions (Signed)
Stop Furosemide  Start Farxiga 10 mg daily  Labs done today, we will call you for abnormal results  Thank you for allowing [place] to provider your heart failure care after your recent hospitalization. Please follow-up with Dr Gardiner Rhyme as scheduled on 08/18/20  Do the following things EVERYDAY: 1) Weigh yourself in the morning before breakfast. Write it down and keep it in a log. 2) Take your medicines as prescribed 3) Eat low salt foods--Limit salt (sodium) to 2000 mg per day.  4) Stay as active as you can everyday 5) Limit all fluids for the day to less than 2 liters  Weigh yourself EVERY morning after you go to the bathroom but before you eat or drink anything. Write this number down in a weight log/diary. If you gain 3 pounds overnight or 5 pounds in a week, call the heart failure clinic  Limit your fluid intake to less than 2 liters of fluid per day. Fluid includes all drinks, coffee, juice, ice chips, soup, jello, and all other liquids.  Restrict your sodium intake to less than 2000mg  per day. This will help prevent your body from holding onto fluid. Read food labels as a lot of canned and packaged foods have a lot of sodium.

## 2020-08-11 ENCOUNTER — Telehealth (HOSPITAL_COMMUNITY): Payer: Self-pay | Admitting: Licensed Clinical Social Worker

## 2020-08-11 ENCOUNTER — Other Ambulatory Visit (HOSPITAL_COMMUNITY): Payer: Self-pay

## 2020-08-11 ENCOUNTER — Telehealth (HOSPITAL_COMMUNITY): Payer: Self-pay

## 2020-08-11 DIAGNOSIS — F1020 Alcohol dependence, uncomplicated: Secondary | ICD-10-CM | POA: Insufficient documentation

## 2020-08-11 DIAGNOSIS — F1410 Cocaine abuse, uncomplicated: Secondary | ICD-10-CM | POA: Insufficient documentation

## 2020-08-11 NOTE — Telephone Encounter (Signed)
Transitions of Care Pharmacy   Call attempted for a pharmacy transitions of care follow-up. Phone went to busy signal, unable to leave voicemail. Call attempt #1. Will follow-up in 2-3 days.

## 2020-08-11 NOTE — Progress Notes (Signed)
Heart and Vascular Care Navigation  08/11/2020  Austin Cobb 1960/12/16 [id number]  Reason for Referral: Patient contacted via phone to follow up on HF TOC visit.   Engaged with patient by telephone for initial visit for Heart and Vascular Care Coordination.                                                                                                   Assessment: Patient is a 60yo male who lives in a mobile home with a roommate Austin Cobb and her adult daughter. He states he has insurance and denies any concerns with medical bills. He states he has food, transportation (I have [place]!) and no concerns with housing. Patient was adamant  that he needs a Education officer, museum and his only issue is he needs "someone to manage my needs and get me straighten out with the Entresto".                                    HRT/VAS Care Coordination    Patients Home Cardiology Office --  HF [facility] clinic   Outpatient Care Team Social Worker   Social Worker Name: Austin Cobb [phone]   Living arrangements for the past 2 months Mobile Home   Lives with: Roommate; Other (Comment)  Roomate Austin Cobb and her daughter 72yo   Patient Current Insurance Coverage Managed Medicare   Patient Has Concern With Paying Medical Bills No   Does Patient Have Prescription Coverage? Yes   Patient Prescription Assistance Programs Patient Assistance Programs   Home Assistive Devices/Equipment None   DME Agency NA      Social History:                                                                             SDOH Screenings   Alcohol Screen: Not on file  Depression (JAS5-0): Not on file  Financial Resource Strain: Low Risk   . Difficulty of Paying Living Expenses: Not very hard  Food Insecurity: No Food Insecurity  . Worried About Charity fundraiser in the Last Year: Never true  . Ran Out of Food in the Last Year: Never true  Housing: Low Risk   . Last Housing Risk Score: 0  Physical Activity: Not on file  Social  Connections: Not on file  Stress: Not on file  Tobacco Use: High Risk  . Smoking Tobacco Use: Current Every Day Smoker  . Smokeless Tobacco Use: Never Used  Transportation Needs: Unmet Transportation Needs  . Lack of Transportation (Medical): No  . Lack of Transportation (Non-Medical): Yes    SDOH Interventions:  Inpatient/Outpatient Substance Abuse Counseling/Rehab Options n/a  Provided Pharmacy assistance resources Patient Assistance Programs  Patient expressed Mental Health concerns No.  Patient Referred to: n/a   Follow-up plan:  Patient was provided with samples of Entresto and the application for Patient Assistance to complete and bring to his follow up appointment at the [facility]. CSW will follow up with Austin Hummer, LCSW to meet with patient during his follow up appointment next week. Austin Cobb, [place], [place]

## 2020-08-14 ENCOUNTER — Telehealth (HOSPITAL_COMMUNITY): Payer: Self-pay
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
## 2020-08-15 ENCOUNTER — Telehealth: Payer: Self-pay | Admitting: Cardiology

## 2020-08-15 ENCOUNTER — Encounter (HOSPITAL_COMMUNITY): Payer: Self-pay | Admitting: Emergency Medicine

## 2020-08-15 ENCOUNTER — Other Ambulatory Visit: Payer: Self-pay

## 2020-08-15 ENCOUNTER — Telehealth (HOSPITAL_COMMUNITY): Payer: Self-pay | Admitting: Pharmacist

## 2020-08-15 ENCOUNTER — Emergency Department (HOSPITAL_COMMUNITY)
Admission: EM | Admit: 2020-08-15 | Discharge: 2020-08-15 | Discharge status: Against Medical Advice | Payer: Medicare (Managed Care) | Attending: Emergency Medicine | Admitting: Emergency Medicine

## 2020-08-15 DIAGNOSIS — F10129 Alcohol abuse with intoxication, unspecified: Secondary | ICD-10-CM | POA: Insufficient documentation

## 2020-08-15 DIAGNOSIS — Z5321 Procedure and treatment not carried out due to patient leaving prior to being seen by health care provider: Secondary | ICD-10-CM | POA: Insufficient documentation
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
## 2020-08-15 NOTE — Telephone Encounter (Signed)
Attempted to contact pt, no answer, no VM

## 2020-08-15 NOTE — ED Notes (Signed)
Pt is being belligerent to staff with abusive language and threatening posture. Attempted de-escalation without success. Pt stating he does not want to be here and that we can not keep him. Pt has IV in place from EMS. IV removed. Pt walked out front doors.

## 2020-08-15 NOTE — Telephone Encounter (Signed)
Spoke with [name] - significant other/roommate of patient Patient sees Dr. Gardiner Rhyme She reports BP is low - 97/66, HR 111; 86/77; 96/82; 93/73 EMS is assessing patient now - EMS said patient needs to go to the hospital Advised patient should follow EMS advice to be transported to hospital.

## 2020-08-18 ENCOUNTER — Ambulatory Visit: Payer: Medicare (Managed Care) | Admitting: Cardiology

## 2020-08-18 NOTE — Progress Notes (Deleted)
Cardiology Office Note:    Date:  08/18/2020   ID:  Austin Cobb, DOB March 20, 1960, MRN [id number]  PCP:  Patient, No Pcp Per (Inactive)  Cardiologist:  Donato Heinz, MD  Electrophysiologist:  None   Referring MD: No ref. provider found   No chief complaint on file. ***  History of Present Illness:    Austin Cobb is a 60 y.o. male with a hx of nonischemic cardiomyopathy attributed to polysubstance use disorder, nonobstructive CAD, bipolar disorder, paroxysmal atrial flutter who presents for hospital follow-up.  Past Medical History:  Diagnosis Date   Anxiety    Arthritis    Bipolar 1 disorder (Austin Cobb)    Depression    Bipolar, not on meds/pt. stopped meds on own over 12 yrs ago   GERD (gastroesophageal reflux disease)    History of colon polyps    Manic disorder (Austin Cobb)    Noncompliance 05/14/2019   Substance abuse (Austin Cobb)    42yrs ago smoke crack and marjuana   Tobacco abuse 05/13/2012    Past Surgical History:  Procedure Laterality Date   CARDIOVERSION N/A 05/06/2019   Procedure: CARDIOVERSION;  Surgeon: Nahser, Wonda Cheng, MD;  Location: [place];  Service: Cardiovascular;  Laterality: N/A;   CARDIOVERSION N/A 05/20/2019   Procedure: CARDIOVERSION;  Surgeon: Larey Dresser, MD;  Location: [place];  Service: Cardiovascular;  Laterality: N/A;   EXAMINATION UNDER ANESTHESIA N/A 11/12/2012   Procedure: Jasmine December UNDER ANESTHESIA;  Surgeon: Adin Hector, MD;  Location: [facility];  Service: General;  Laterality: N/A;   EYE SURGERY     bilateral eye sx as a child 6-78yrs old.   RIGHT/LEFT HEART CATH AND CORONARY ANGIOGRAPHY N/A 05/18/2019   Procedure: RIGHT/LEFT HEART CATH AND CORONARY ANGIOGRAPHY;  Surgeon: Larey Dresser, MD;  Location: [place] [facility];  Service: Cardiovascular;  Laterality: N/A;   SPHINCTEROTOMY N/A 11/12/2012   Procedure: SPHINCTEROTOMY;  Surgeon: Adin Hector, MD;  Location: [facility];  Service: General;  Laterality: N/A;   TEE WITHOUT  CARDIOVERSION N/A 05/06/2019   Procedure: TRANSESOPHAGEAL ECHOCARDIOGRAM (TEE);  Surgeon: Acie Fredrickson Wonda Cheng, MD;  Location: [place];  Service: Cardiovascular;  Laterality: N/A;   TEE WITHOUT CARDIOVERSION N/A 05/20/2019   Procedure: TRANSESOPHAGEAL ECHOCARDIOGRAM (TEE);  Surgeon: Larey Dresser, MD;  Location[facility];  Service: Cardiovascular;  Laterality: N/A;   TONSILLECTOMY      Current Medications: No outpatient medications have been marked as taking for the 08/18/20 encounter (Appointment) with Donato Heinz, MD.     Allergies:   Adhesive [tape]   Social History   Socioeconomic History   Marital status: Divorced    Spouse name: Not on file   Number of children: Not on file   Years of education: Not on file   Highest education level: Not on file  Occupational History   Not on file  Tobacco Use   Smoking status: Every Day    Packs/day: 1.00    Years: 35.00    Pack years: 35.00    Types: Cigarettes   Smokeless tobacco: Never  Substance and Sexual Activity   Alcohol use: Yes    Alcohol/week: 4.0 standard drinks    Types: 4 Cans of beer per week    Comment: 4 cans of beer a day    Drug use: Yes    Types: Cocaine, Marijuana    Comment: Quit x6 yrs ago-    Sexual activity: Never  Other Topics Concern   Not on file  Social History Narrative   Not on file   Social Determinants of Health   Financial Resource Strain: Low Risk    Difficulty of Paying Living Expenses: Not very hard  Food Insecurity: No Food Insecurity   Worried About Charity fundraiser in the Last Year: Never true   Arboriculturist in the Last Year: Never true  Transportation Needs: Unmet Transportation Needs   Lack of Transportation (Medical): No   Lack of Transportation (Non-Medical): Yes  Physical Activity: Not on file  Stress: Not on file  Social Connections: Not on file     Family History: The patient's ***family history is not on file.  ROS:   Please see the history of  present illness.   All other systems reviewed and are negative.  EKGs/Labs/Other Studies Reviewed:    The following studies were reviewed today:  EKG:  EKG is *** ordered today.  The ekg ordered today demonstrates ***  Recent Labs: 07/31/2020: B Natriuretic Peptide 2,128.0 08/01/2020: TSH 3.816 08/04/2020: ALT 11; Hemoglobin 13.1; Magnesium 1.9; Platelets 192 08/10/2020: BUN 34; Creatinine, Ser 1.86; Potassium 5.0; Sodium 139  Recent Lipid Panel    Component Value Date/Time   CHOL 76 05/05/2019 1006   TRIG 41 05/05/2019 1006   HDL 33 (L) 05/05/2019 1006   CHOLHDL 2.3 05/05/2019 1006   VLDL 8 05/05/2019 1006   LDLCALC 35 05/05/2019 1006    Physical Exam:    VS:  There were no vitals taken for this visit.    Wt Readings from Last 3 Encounters:  08/15/20 134 lb (60.8 kg)  08/10/20 143 lb 3.2 oz (65 kg)  08/04/20 136 lb 4.8 oz (61.8 kg)     GEN: *** Well nourished, well developed in no acute distress HEENT: Normal NECK: No JVD; No carotid bruits LYMPHATICS: No lymphadenopathy CARDIAC: ***RRR, no murmurs, rubs, gallops RESPIRATORY:  Clear to auscultation without rales, wheezing or rhonchi  ABDOMEN: Soft, non-tender, non-distended MUSCULOSKELETAL:  No edema; No deformity  SKIN: Warm and dry NEUROLOGIC:  Alert and oriented x 3 PSYCHIATRIC:  Normal affect   ASSESSMENT:    No diagnosis found. PLAN:    Acute on chronic systolic and diastolic heart failure: Nonischemic cardiomyopathy, LVEF <20%.  Cath on 05/18/19 showed nonobstructive CAD.  Suspect NICM due to polysubstance abuse vs tachycardia induced cardiomyopathy. - Continue carvedilol 25 mg twice daily - Continue Entresto 24-26 mg twice daily, will be free to patient per pharmacy - Follow-up labs on 08/10/2020 showed creatinine had increased from 1.3 o 1.86.  Spironolactone and Lasix were held.  Recurrent atrial flutter with 2-1 conduction not on anticoagulation - Discussed options with pharmacy.  Was given xarelto on  admission but then also lovenox and warfarin ordered.  I do not think warfarin will be a good option for him.  When I brought up going to appointments for INR checks, he said he would not do that.  Recommend xarelto and trying to get patient assistance.  Per pharmacy, copay will be $47 - Hold off on TEE/DCCV for now, as he is not a good candidate due to noncompliance.  He did not take meds or followup after his admission in 05/2019, and on presentation in 12/2019 cardioversion was deferred given noncompliance.  He did not take meds or follow-up after that hospitalizaion.  Would recommend rate control strategy for now and cardioversion in 3 weeks if compliant with meds and follow-up   Hypertension: markedly hypertensive on admission, improved with restarting PO meds.  Continue coreg, entresto, and aldactone   Nonobstructive coronary artery disease - Would prefer statin, but forego for now in favor of simplifed medical regimen and cost.   CKD 3 - Monitor renal function   Medication nonadherence - Discussed 4 dollar list, medical regimen optimized for cost and benefit, and how essential adherence is to recovery   Polysubstance use: UDS positive for cocaine  Medication Adjustments/Labs and Tests Ordered: Current medicines are reviewed at length with the patient today.  Concerns regarding medicines are outlined above.  No orders of the defined types were placed in this encounter.  No orders of the defined types were placed in this encounter.   There are no Patient Instructions on file for this visit.   Signed, Donato Heinz, MD  08/18/2020 12:37 AM     [facility]

## 2020-08-21 ENCOUNTER — Telehealth: Payer: Self-pay | Admitting: Cardiology

## 2020-08-21 ENCOUNTER — Telehealth (HOSPITAL_COMMUNITY): Payer: Self-pay

## 2020-08-21 NOTE — Telephone Encounter (Signed)
Return call to pt. He state he was unable to come to appointment on 6/10 but requesting to set up cardioversion. Nurse attempted to schedule an appointment for next Thursday 6/21 but pt refused and said he is not going to continue to run all over the place for an appointment when the cardioversion will be at [facility]. Pt is demanding to speak to Dr. Gardiner Rhyme.

## 2020-08-21 NOTE — Telephone Encounter (Signed)
Transitions of Care Pharmacy   Call attempted for a pharmacy transitions of care follow-up. HIPAA appropriate voicemail was left with call back information provided.   Call attempt #3. Will no longer attempt f/u calls for [facility] pharmacy

## 2020-08-21 NOTE — Telephone Encounter (Signed)
Pt is calling in regards to an appointment missed on 08/18/20 with Dr. Gardiner Rhyme, I attempted to reschedule his appt but pt refused appointment because he wants to speak with Dr. Gardiner Rhyme in regards to having a heart procedure. Please advise

## 2020-08-21 NOTE — Telephone Encounter (Signed)
Spoke with patient.  He requests that we set up his cardioversion, but reiterated to him like we discussed in the hospital that he needs to demonstrate compliance with follow-up appointments and medications before we can cardiovert.  Reiterated that he needs 3 weeks of uninterrupted Xarelto prior to cardioversion.  He states he is not able to come to appointments at [facility] but would be willing to follow at [facility].  Hayley can we set him up for follow-up with a provider at [facility]?

## 2020-08-31 NOTE — Telephone Encounter (Signed)
Left message for patient to schedule hospital f/u with Dr.Chandrasekhar at [facility].

## 2020-09-05 ENCOUNTER — Telehealth: Payer: Self-pay | Admitting: Cardiology

## 2020-09-05 MED ORDER — SPIRONOLACTONE 25 MG PO TABS
25.0000 mg | ORAL_TABLET | Freq: Every day | ORAL | 3 refills | Status: DC
Start: 2020-09-05 — End: 2020-09-06

## 2020-09-05 MED ORDER — RIVAROXABAN 20 MG PO TABS: 20.0000 mg | ORAL_TABLET | Freq: Every day | ORAL | 3 refills | Status: DC

## 2020-09-05 MED ORDER — CARVEDILOL 25 MG PO TABS: 25.0000 mg | ORAL_TABLET | Freq: Two times a day (BID) | ORAL | 3 refills | Status: DC

## 2020-09-05 NOTE — Telephone Encounter (Signed)
This is Dr. Newman Nickels pt, please provide samples for pt. Thanks

## 2020-09-05 NOTE — Telephone Encounter (Signed)
    Patient calling the office for samples of medication:   1.  What medication and dosage are you requesting samples for? Entresto  2.  Are you currently out of this medication? Yes  Pt is out of meds and can't affrod to get entresto. Needs sample today, he is asking if he can pick up the samples at out [facility] [address] Office

## 2020-09-05 NOTE — Telephone Encounter (Signed)
Spoke to patient, he is unable to drive to [place] to get samples.  [place] unable to provide samples.  Per chart review: patient was approved for patient assistance for [facility] and was provided # to call to arrange delivery during current admission.  Spoke with patient-provided # to call [employer] again to arrange delivery.  He wrote number down to call.   He request refills on his other cardiac medications.   Prescriptions sent to pharmacy.      He is aware he will need appointment prior to scheduling procedure.   Transferred to scheduling to arrange appointment at [facility] as previously approved.

## 2020-09-05 NOTE — Telephone Encounter (Signed)
    Pt said his VS is doing great, BP of 117/71 with HR of 97. He said its been a long time that he have normal BP and HR and this is the best time to schedule his procedure Dr. Gardiner Rhyme mentioned to him to shock his heart to go back to rhythm, he really wants to schedule this procedure as soon as possible.

## 2020-09-05 NOTE — Telephone Encounter (Signed)
    *  STAT* If patient is at the pharmacy, call can be transferred to refill team.   1. Which medications need to be refilled? (please list name of each medication and dose if known) carvedilol (COREG) 25 MG tablet dapagliflozin propanediol (FARXIGA) 10 MG TABS tablet rivaroxaban (XARELTO) 20 MG TABS tablet spironolactone (ALDACTONE) 25 MG tablet     2. Which pharmacy/location (including street and city if local pharmacy) is medication to be sent to? [facility] [phone] - , [place] - [address] AT [place]  3. Do they need a 30 day or 90 day supply? 90 days  Pt is out of meds and needs refill today

## 2020-09-05 NOTE — Telephone Encounter (Signed)
See duplicate phone note-patient aware he needs appt prior to scheduling procedure.    Transferred to scheduling to arrange.

## 2020-09-06 MED ORDER — CARVEDILOL 25 MG PO TABS: 25.0000 mg | ORAL_TABLET | Freq: Two times a day (BID) | ORAL | 3 refills | Status: DC

## 2020-09-06 MED ORDER — RIVAROXABAN 20 MG PO TABS: 20.0000 mg | ORAL_TABLET | Freq: Every day | ORAL | 3 refills | Status: DC

## 2020-09-06 MED ORDER — SPIRONOLACTONE 25 MG PO TABS
25.0000 mg | ORAL_TABLET | Freq: Every day | ORAL | 3 refills | Status: DC
Start: 2020-09-06 — End: 2020-11-24

## 2020-09-06 MED ORDER — DAPAGLIFLOZIN PROPANEDIOL 10 MG PO TABS: 10.0000 mg | ORAL_TABLET | Freq: Every day | ORAL | 6 refills | Status: DC

## 2020-09-06 NOTE — Telephone Encounter (Signed)
Pt will need to complete new application for Entresto PAP along with [place] and Xarelto. All assistance applications were interofficed to [employer], Therapist, sports, at [employer]. Pt had stated he didn't have a way to get to [facility] office. Of note pt has been enrolled in [employer] and can obtain a ride to [facility] appointments by calling [phone].   Westley Hummer, MSW, [place]  [phone]

## 2020-09-06 NOTE — Telephone Encounter (Signed)
Samples provided at [facility] office for pick up.     Patient made aware (advised office closes at 5) so will need to pick up tomorrow.  Also aware applications should be there to complete for medications as well.    Patient verbalized understanding

## 2020-09-06 NOTE — Telephone Encounter (Signed)
Patient calling the office for samples of medication:   1.  What medication and dosage are you requesting samples for? sacubitril-valsartan (ENTRESTO) 24-26 MG rivaroxaban (XARELTO) 20 MG TABS tablet  dapagliflozin propanediol (FARXIGA) 10 MG TABS tablet   2.  Are you currently out of this medication?   Yes   Wants to confirm they are available will have an uber arranged for pickup.

## 2020-09-13 NOTE — Telephone Encounter (Signed)
I called pt to make him aware that he needs to come by and complete his Pt Asst applications so we can get them completed and faxed in.  I have left them at the front desk and pt stated he will come by tomorrow to complete.

## 2020-09-19 ENCOUNTER — Telehealth: Payer: Self-pay | Admitting: Cardiology

## 2020-09-19 MED ORDER — DAPAGLIFLOZIN PROPANEDIOL 10 MG PO TABS: 10.0000 mg | ORAL_TABLET | Freq: Every day | ORAL | 6 refills | Status: AC

## 2020-09-19 MED ORDER — SACUBITRIL-VALSARTAN 24-26 MG PO TABS: 1.0000 | ORAL_TABLET | Freq: Two times a day (BID) | ORAL | 0 refills | Status: AC

## 2020-09-19 MED ORDER — RIVAROXABAN 20 MG PO TABS: 20.0000 mg | ORAL_TABLET | Freq: Every day | ORAL | 3 refills | Status: DC

## 2020-09-19 NOTE — Telephone Encounter (Signed)
3 applications have been faxed to designated places.

## 2020-09-19 NOTE — Telephone Encounter (Signed)
See previous note (09/19/20)

## 2020-09-19 NOTE — Telephone Encounter (Signed)
*  STAT* If patient is at the pharmacy, call can be transferred to refill team.   1. Which medications need to be refilled? (please list name of each medication and dose if known) dapagliflozin propanediol (FARXIGA) 10 MG TABS tablet rivaroxaban (XARELTO) 20 MG TABS tablet sacubitril-valsartan (ENTRESTO) 24-26 MG  2. Which pharmacy/location (including street and city if local pharmacy) is medication to be sent to? [facility]  3. Do they need a 30 day or 90 day supply? [place]

## 2020-09-19 NOTE — Telephone Encounter (Signed)
Left patient a detailed message informing him that his rx's have been sent to the pharmacy. I stated we have samples of Xarelto and Farxiga but no Entresto. I stated that we will place the samples we have upfront for pick up with office hours. Advised patient to contact the office with any question, concerns or if he did not want the samples.   Samples placed upfront for pickup.

## 2020-09-19 NOTE — Telephone Encounter (Signed)
See previous note

## 2020-09-19 NOTE — Telephone Encounter (Signed)
*  STAT* If patient is at the pharmacy, call can be transferred to refill team.   1. Which medications need to be refilled? (please list name of each medication and dose if known)  rivaroxaban (XARELTO) 20 MG TABS tablet sacubitril-valsartan (ENTRESTO) 24-26 MG dapagliflozin propanediol (FARXIGA) 10 MG TABS tablet  2. Which pharmacy/location (including street and city if local pharmacy) is medication to be sent to? [facility] [phone] - [place], [place] - [address] AT [place]  3. Do they need a 30 day or 90 day supply?  90 day supply    Began crying saying he cannot afford this if it will cost him anything. If prescriptions will cost he would like samples. Only has one tablet of Xarelto left. Pt. Advised he is a psychiatric patient and kept stating he doesn't know what to do anymore between his mortgage and wife dying he feels he is stuck between pins and needles. Please advise.

## 2020-09-19 NOTE — Telephone Encounter (Signed)
**Note De-Identified  Obfuscation** I have completed the provider page of a [employer] Pt Asst application for [name], a [employer] Pt Asst application for Xarelto , and a [place] and [place] Pt Asst application for [place] and emailed all to Dr Clovis Fredrickson nurse so she can obtain his signature and date all 3 applications and to then fax all to the appropriate foundation's fax number.written on each cover letter or to place in the to b faxed box in Medical Records to be faxed.  The pt did not provide any documents when he left his applications at the office..  I am forwarding this note to: 1. Dr Oralia Rud nurse so she is aware that  have emailed 3 applications to her.  2. Refills dept to contact the pt as he will needThanks All samples until we reach a resolution for his Claudie Leach, and [place] cost.  [place] and Ernestina Columbia, LCSW (based on phone note) to reach out to the pt to see if there is any other assistance available for him.

## 2020-09-19 NOTE — Telephone Encounter (Signed)
Patient was calling to see if he could get any samples of medication because of the cost of it. Please advise

## 2020-09-19 NOTE — Telephone Encounter (Signed)
Patient is no longer Dr.Schumann's patient.He is scheduled to see Dr.Chandrasekhar 10/03/20.Message sent to refill team for samples and sent to patient assistance nurse.

## 2020-09-19 NOTE — Telephone Encounter (Signed)
Message already sent to [facility] office.

## 2020-09-20 ENCOUNTER — Telehealth: Payer: Self-pay | Admitting: Licensed Clinical Social Worker

## 2020-09-20 ENCOUNTER — Telehealth: Payer: Self-pay | Admitting: Cardiology

## 2020-09-20 NOTE — Telephone Encounter (Signed)
Pt is needing more samples of the following medications:      dapagliflozin propanediol (FARXIGA) 10 MG TABS tablet   sacubitril-valsartan (ENTRESTO) 24-26 MG  rivaroxaban (XARELTO) 20 MG TABS tablet   Please advise

## 2020-09-20 NOTE — Telephone Encounter (Signed)
A user error has taken place: encounter opened in error, closed for administrative reasons.

## 2020-09-20 NOTE — Telephone Encounter (Signed)
SW received request to follow up with patient to offer supportive assistance. CSW contacted patient and message left for return call. Raquel Sarna, [place], [place]

## 2020-09-20 NOTE — Telephone Encounter (Signed)
Called pt to fu on medication samples.  He reports he has not picked up samples.  I sent a message to [facility] to f/u on samples tomorrow.  Pt said he will call back tomorrow to see if samples are ready for him to pick up.

## 2020-09-21 NOTE — Telephone Encounter (Signed)
Pt was given samples of farxiga and xarelto, unable to give samples of entresto, because I only have 1 bottle. Thanks

## 2020-09-22 ENCOUNTER — Telehealth: Payer: Self-pay

## 2020-09-22 NOTE — Telephone Encounter (Signed)
**Note De-Identified  Obfuscation** Letter received from [employer] pt asst Foundation stating that they approved the pt for asst with Entresto until 03/10/21. Pt ID: [id number]  The letter states that they have notified the pt of this approval as well.

## 2020-09-22 NOTE — Telephone Encounter (Signed)
**Note De-Identified  Obfuscation** Letter received from [employer] Pt Asst Foundation stating that the pts address was incomplete on his application for asst with Xarelto. I have corrected this on the pts application and have emailed his entire application to Dr Oralia Rud nurse so she can obtain his signature, date it and to fax all to [employer] at the fax number written on cover letter included or to place in Medical Records to be faxed.

## 2020-09-25 NOTE — Telephone Encounter (Signed)
Corrected application faxed to [employer] patient assistance.

## 2020-09-25 NOTE — Telephone Encounter (Signed)
2 week supply of Entresto left a front desk for patient to pick up.  Pt currently working with patient assistance and SW.  Patient called and informed he will pick up medication tomorrow 09/26/20.

## 2020-09-27 ENCOUNTER — Telehealth: Payer: Self-pay

## 2020-09-27 NOTE — Telephone Encounter (Signed)
**Note De-Identified Austin Cobb Obfuscation** Letter received Austin Cobb fax from [employer] Pt Asst Foundation stating that they have denied the pts for asst with Xarelto. Reason for denial: [name] does not meet requirements for approval as he has ins coverage for his Xarelto.   The letter states that they have also notified the pt of this denial as well.

## 2020-10-01 ENCOUNTER — Telehealth: Payer: Self-pay | Admitting: Medical

## 2020-10-01 NOTE — Telephone Encounter (Signed)
   Patient's caretaker, Austin Cobb, called the after hours line to report concerns for elevated heart rates and low blood pressures. [name] gave verbal consent over the phone to speak with Austin Cobb. She reports for the past several days his HR has been persistently elevated >120. His blood pressures have been low with SBP persistently in the 80s-90s. [name] has had increased SOB, exertional fatigue, and LE edema. No complaints of CP. He has known persistent atrial flutter, though TEE/DCCV has not been pursued due to non-compliance concerns. He is a known alcoholic. In addition he has a severe NICM with EF <20% on last echo 07/2020. Suspect his Atrial flutter with RVR is driving acute on chronic combined CHF. Management options are limited over the phone. Feel he would be best managed by presenting to the ED for evaluation. Austin Cobb to call EMS for further evaluation. She was appreciative of the call and in agreement with the plan.    Abigail Butts, PA-C 10/01/20; 4:08 PM

## 2020-10-02 NOTE — Progress Notes (Signed)
Cardiology Office Note:    Date:  10/03/2020   ID:  Austin Cobb, DOB Sep 26, 1960, MRN [id number]  PCP:  Patient, No Pcp Per (Inactive)   [facility] Providers Cardiologist:  Werner Lean, MD     Referring MD: No ref. provider found  CC:  Transition to new cardiologist from [facility]  History of Present Illness:    Austin Cobb is a 60 y.o. male with a hx of AFL, HFrEF, Hx of polysubstance abuse (crack-cocaine, marijuana), bipolar current alcohol use who presents for evaluation 10/03/20.  Patient notes that he is doing OK.  Called in 10/01/20 because heart rates are 120s with BP in 80s, 90s.  Has known persistent AFL.  Did not go to ED.  Has been taking the Entresto once a day.  Patient is afraid to leaving the house.  He has DOE and fatigue with mild exertion.  Can't function right and cannot leave the house without feeling worried.  Has paranoia that something is going to happen with his heart.  Get's a litter bit of exercise but not too much.  No chest pain or pressure.  No SOB at rest and no PND/Orthopnea.  No weight gain or leg swelling.  No palpitations or syncope despite having heart rates 110s-120s.  Still drinks daily (has not been alcohol free since age 35).  Ambulatory blood pressure SBP 120.  Past Medical History:  Diagnosis Date   Anxiety    Arthritis    Bipolar 1 disorder ([place])    Depression    Bipolar, not on meds/pt. stopped meds on own over 12 yrs ago   GERD (gastroesophageal reflux disease)    History of colon polyps    Manic disorder ([place])    Noncompliance 05/14/2019   Substance abuse ([place])    47yr ago smoke crack and marjuana   Tobacco abuse 05/13/2012    Past Surgical History:  Procedure Laterality Date   CARDIOVERSION N/A 05/06/2019   Procedure: CARDIOVERSION;  Surgeon: Nahser, PWonda Cheng MD;  [facility] [facility]  Service: Cardiovascular;  Laterality: N/A;   CARDIOVERSION N/A 05/20/2019   Procedure: CARDIOVERSION;  Surgeon: MLarey Dresser MD;  [place] [facility]  Service: Cardiovascular;  Laterality: N/A;   EXAMINATION UNDER ANESTHESIA N/A 11/12/2012   Procedure: EJasmine DecemberUNDER ANESTHESIA;  Surgeon: SAdin Hector MD;  [facility];  Service: General;  Laterality: N/A;   EYE SURGERY     bilateral eye sx as a child [id number].   RIGHT/LEFT HEART CATH AND CORONARY ANGIOGRAPHY N/A 05/18/2019   Procedure: RIGHT/LEFT HEART CATH AND CORONARY ANGIOGRAPHY;  Surgeon: McLarey DresserMD;  [facility];  Service: Cardiovascular;  Laterality: N/A;   SPHINCTEROTOMY N/A 11/12/2012   Procedure: SPHINCTEROTOMY;  Surgeon: StAdin HectorMD;  [facility];  Service: General;  Laterality: N/A;   TEE WITHOUT CARDIOVERSION N/A 05/06/2019   Procedure: TRANSESOPHAGEAL ECHOCARDIOGRAM (TEE);  Surgeon: NaAcie FredricksonhWonda ChengMD;  [facility] Service: Cardiovascular;  Laterality: N/A;   TEE WITHOUT CARDIOVERSION N/A 05/20/2019   Procedure: TRANSESOPHAGEAL ECHOCARDIOGRAM (TEE);  Surgeon: McLarey DresserMD;  [facility];  Service: Cardiovascular;  Laterality: N/A;   TONSILLECTOMY      Current Medications: Current Meds  Medication Sig   carvedilol (COREG) 25 MG tablet Take 1 tablet (25 mg total) by mouth 2 (two) times daily with a meal.   cholecalciferol (VITAMIN D3) 25 MCG (1000 UNIT) tablet Take 2,000 Units by mouth daily.  dapagliflozin propanediol (FARXIGA) 10 MG TABS tablet Take 1 tablet (10 mg total) by mouth daily before breakfast.   feeding supplement, ENSURE ENLIVE, (ENSURE ENLIVE) LIQD Take 237 mLs by mouth in the morning, at noon, in the evening, and at bedtime.   ferrous sulfate 325 (65 FE) MG tablet Take 1 tablet (324 mg total) by mouth daily with breakfast.   lactulose (CHRONULAC) 10 GM/15ML solution Take 30 mLs (20 g total) by mouth 2 (two) times daily.   Multiple Vitamin (MULTIVITAMIN WITH MINERALS) TABS tablet Take 1 tablet by mouth daily.   rivaroxaban (XARELTO) 20 MG TABS tablet Take 1  tablet (20 mg total) by mouth daily with supper.   sacubitril-valsartan (ENTRESTO) 24-26 MG Take 1 tablet by mouth 2 (two) times daily.   spironolactone (ALDACTONE) 25 MG tablet Take 1 tablet (25 mg total) by mouth daily.     Allergies:   Adhesive [tape]   Social History   Socioeconomic History   Marital status: Divorced    Spouse name: Not on file   Number of children: Not on file   Years of education: Not on file   Highest education level: Not on file  Occupational History   Not on file  Tobacco Use   Smoking status: Every Day    Packs/day: 1.00    Years: 35.00    Pack years: 35.00    Types: Cigarettes   Smokeless tobacco: Never  Substance and Sexual Activity   Alcohol use: Yes    Alcohol/week: 4.0 standard drinks    Types: 4 Cans of beer per week    Comment: 4 cans of beer a day    Drug use: Yes    Types: Cocaine, Marijuana    Comment: Quit x6 yrs ago-    Sexual activity: Never  Other Topics Concern   Not on file  Social History Narrative   Not on file   Social Determinants of Health   Financial Resource Strain: Low Risk    Difficulty of Paying Living Expenses: Not very hard  Food Insecurity: No Food Insecurity   Worried About Charity fundraiser in the Last Year: Never true   Arboriculturist in the Last Year: Never true  Transportation Needs: Unmet Transportation Needs   Lack of Transportation (Medical): No   Lack of Transportation (Non-Medical): Yes  Physical Activity: Not on file  Stress: Not on file  Social Connections: Not on file    Social:  Has a care provider in her house [name])  Family History: History of coronary artery disease notable for no members. History of heart failure notable for no members. History of arrhythmia notable for no members.  ROS:   Please see the history of present illness.     All other systems reviewed and are negative.  EKGs/Labs/Other Studies Reviewed:    The following studies were reviewed today:  EKG:  EKG is   ordered today.  The ekg ordered today demonstrates  10/03/20: AFL rate 80  Transthoracic Echocardiogram: Date: 08/01/20 Results: 1. Left ventricular ejection fraction, by estimation, is <20%. The left  ventricle has severely decreased function. The left ventricle demonstrates  global hypokinesis. There is moderate left ventricular hypertrophy of the  posterior segment.   2. Right ventricular systolic function is normal. The right ventricular  size is mildly enlarged.   3. Left atrial size was moderately dilated.   4. The pericardial effusion is posterior to the left ventricle.   5. The mitral valve is  normal in structure. Mild to moderate mitral valve  regurgitation. No evidence of mitral stenosis.   6. The aortic valve is normal in structure. Aortic valve regurgitation is  not visualized. No aortic stenosis is present.   7. The inferior vena cava is dilated in size with <50% respiratory  variability, suggesting right atrial pressure of 15 mmHg.   Transesophageal Echocardiogram: Date: 05/20/19 Results:  1. No LV thrombus. Left ventricular ejection fraction, by estimation, is  25 to 30%. The left ventricle has severely decreased function. The left  ventricle demonstrates global hypokinesis. The left ventricular internal  cavity size was mildly dilated.   2. Right ventricular systolic function is moderately reduced. The right  ventricular size is mildly enlarged.   3. No ASD or PFO, negative bubble study. Left atrial size was mildly  dilated. No left atrial/left atrial appendage thrombus was detected.   4. Right atrial size was mildly dilated.   5. Peak RV-RA gradient 25 mmHg.   6. Normal caliber aorta with mild plaque.   7. The aortic valve is tricuspid. Aortic valve regurgitation is not  visualized. No aortic stenosis is present.   8. The mitral valve is normal in structure. Trivial mitral valve  regurgitation. No evidence of mitral stenosis.    Left/Right Heart  Catheterizations: Date: 05/18/2019 Results: 1. Low filling pressures. 2. Preserved cardiac output. 3. Nonobstructive mild coronary disease.   Nonischemic cardiomyopathy.  Will start Eliquis this evening.  Recent Labs: 07/31/2020: B Natriuretic Peptide 2,128.0 08/01/2020: TSH 3.816 08/04/2020: ALT 11; Hemoglobin 13.1; Magnesium 1.9; Platelets 192 08/10/2020: BUN 34; Creatinine, Ser 1.86; Potassium 5.0; Sodium 139  Recent Lipid Panel    Component Value Date/Time   CHOL 76 05/05/2019 1006   TRIG 41 05/05/2019 1006   HDL 33 (L) 05/05/2019 1006   CHOLHDL 2.3 05/05/2019 1006   VLDL 8 05/05/2019 1006   LDLCALC 35 05/05/2019 1006    Risk Assessment/Calculations:    CHA2DS2-VASc Score = 1  This indicates a 0.6% annual risk of stroke. The patient's score is based upon: CHF History: Yes HTN History: No Diabetes History: No Stroke History: No Vascular Disease History: No Age Score: 0 Gender Score: 0         Physical Exam:    VS:  BP 102/60   Ht '5\' 10"'$  (1.778 m)   Wt 135 lb (61.2 kg)   BMI 19.37 kg/m     Heart rates 80  Wt Readings from Last 3 Encounters:  10/03/20 135 lb (61.2 kg)  08/15/20 134 lb (60.8 kg)  08/10/20 143 lb 3.2 oz (65 kg)     GEN:  Well nourished, well developed in no acute distress HEENT: Normal NECK: No JVD LYMPHATICS: No lymphadenopathy CARDIAC: irregular rate and rhythm, no murmurs, rubs, gallops RESPIRATORY:  Clear to auscultation without rales, wheezing or rhonchi  ABDOMEN: Soft, non-tender, non-distended MUSCULOSKELETAL:  No edema; No deformity  SKIN: Warm and dry NEUROLOGIC:  Alert and oriented x 3 PSYCHIATRIC:  Manic affect   ASSESSMENT:    1. Atrial flutter, unspecified type ([place])   2. Chronic combined systolic and diastolic heart failure ([place])   3. Bipolar affective disorder, current episode manic, current episode severity unspecified ([place])    PLAN:    Persistent AFL CHADSVASC 1 With Alcohol Misuse with prior-withdrawal History of  polysubstance abuse HFrEF Bipolar Disorder - NYHA class II, Stage III, euvolemic, etiology from AFL  - HF likely mediated from AFL - has missed doses of Xarelto; continue Xarelto (since  07/31/20) until we are able to get him Pradaxa patient assist or until he can maintain safe abstinence from alcohol; sent to PharmD clinic for possible coumadin start once sober as we have run out of the samples are are allowed to give for him for long term treatment - continue Coreg 25 mg PO BID (free at [facility]) - Continue Farixa 10 mg PO Daily - Return Entresto 24-26 mg PO BID- covered - continue spironolactone 25 mg PO Daily (free at [facility]) - if we are unable to assist with [name] we will transition to losartan 25 mg PO Daily - we have discussed alcohol use at length - we are willing to refer to PCP; patient notes he is out of network for [facility]  Will need either 2-3 month f/u with [place] OR follow up with in network cardiology  Time Spent Directly with Patient:   I have spent a total of 75 minutes with the patient reviewing notes, imaging, EKGs, labs and examining the patient as well as establishing an assessment and plan that was discussed personally with the patient.  > 50% of time was spent in direct patient care and coordinating.    Medication Adjustments/Labs and Tests Ordered: Current medicines are reviewed at length with the patient today.  Concerns regarding medicines are outlined above.  Orders Placed This Encounter  Procedures   Pro b natriuretic peptide (BNP)   Magnesium   Basic metabolic panel   AMB Referral to [facility] Pharm[facility]   Ambulatory referral to Anticoagulation Monitoring   EKG 12-Lead    No orders of the defined types were placed in this encounter.   Patient Instructions  Medication Instructions:  Your physician recommends that you continue on your current medications as directed. Please refer to the Current Medication list given to you today.  *If you need a  refill on your cardiac medications before your next appointment, please call your pharmacy*   Lab Work: TODAY: BNP, BMP, Mg If you have labs (blood work) drawn today and your tests are completely normal, you will receive your results only by: [place] (if you have MyChart) OR A paper copy in the mail If you have any lab test that is abnormal or we need to change your treatment, we will call you to review the results.   Testing/Procedures: Your physician has referred you to see Coumadin clinic.   Your physician has referred you to our [place] [facility].    Follow-Up: At [facility], you and your health needs are our priority.  As part of our continuing mission to provide you with exceptional heart care, we have created designated Provider Care Teams.  These Care Teams include your primary Cardiologist (physician) and Advanced Practice Providers (APPs -  Physician Assistants and Nurse Practitioners) who all work together to provide you with the care you need, when you need it.  We recommend signing up for the patient portal called "MyChart".  Sign up information is provided on this After Visit Summary.  MyChart is used to connect with patients for Virtual Visits (Telemedicine).  Patients are able to view lab/test results, encounter notes, upcoming appointments, etc.  Non-urgent messages can be sent to your provider as well.   To learn more about what you can do with MyChart, go to [email or web address].    Your next appointment:   2 month(s)  The format for your next appointment:   In Person  Provider:   You may see Rudean Haskell, MD or one of the  following Advanced Practice Providers on your designated Care Team:   Melina Copa, PA-C Ermalinda Barrios, PA-C         Signed, Werner Lean, MD  10/03/2020 2:12 PM    [facility]

## 2020-10-03 ENCOUNTER — Ambulatory Visit (INDEPENDENT_AMBULATORY_CARE_PROVIDER_SITE_OTHER): Payer: Medicare (Managed Care) | Admitting: Internal Medicine

## 2020-10-03 ENCOUNTER — Other Ambulatory Visit: Payer: Self-pay

## 2020-10-03 ENCOUNTER — Telehealth: Payer: Self-pay | Admitting: Internal Medicine

## 2020-10-03 ENCOUNTER — Encounter: Payer: Self-pay | Admitting: Internal Medicine

## 2020-10-03 VITALS — BP 102/60 | Ht 70.0 in | Wt 135.0 lb

## 2020-10-03 DIAGNOSIS — I5042 Chronic combined systolic (congestive) and diastolic (congestive) heart failure: Secondary | ICD-10-CM | POA: Diagnosis not present

## 2020-10-03 DIAGNOSIS — I4892 Unspecified atrial flutter: Secondary | ICD-10-CM | POA: Diagnosis not present

## 2020-10-03 DIAGNOSIS — F3110 Bipolar disorder, current episode manic without psychotic features, unspecified: Secondary | ICD-10-CM

## 2020-10-03 NOTE — Patient Instructions (Addendum)
Medication Instructions:  Your physician recommends that you continue on your current medications as directed. Please refer to the Current Medication list given to you today.  *If you need a refill on your cardiac medications before your next appointment, please call your pharmacy*   Lab Work: TODAY: BNP, BMP, Mg If you have labs (blood work) drawn today and your tests are completely normal, you will receive your results only by: [place] (if you have MyChart) OR A paper copy in the mail If you have any lab test that is abnormal or we need to change your treatment, we will call you to review the results.   Testing/Procedures: Your physician has referred you to see Coumadin clinic.   Your physician has referred you to our [place] [facility].    Follow-Up: At [facility], you and your health needs are our priority.  As part of our continuing mission to provide you with exceptional heart care, we have created designated Provider Care Teams.  These Care Teams include your primary Cardiologist (physician) and Advanced Practice Providers (APPs -  Physician Assistants and Nurse Practitioners) who all work together to provide you with the care you need, when you need it.  We recommend signing up for the patient portal called "MyChart".  Sign up information is provided on this After Visit Summary.  MyChart is used to connect with patients for Virtual Visits (Telemedicine).  Patients are able to view lab/test results, encounter notes, upcoming appointments, etc.  Non-urgent messages can be sent to your provider as well.   To learn more about what you can do with MyChart, go to [email or web address].    Your next appointment:   2 month(s)  The format for your next appointment:   In Person  Provider:   You may see Rudean Haskell, MD or one of the following Advanced Practice Providers on your designated Care Team:   Melina Copa, PA-C Ermalinda Barrios, PA-C

## 2020-10-03 NOTE — Telephone Encounter (Signed)
    Pt's called back he wanted to add Entresto to his samples request

## 2020-10-03 NOTE — Telephone Encounter (Signed)
Patient calling the office for samples of medication:   1.  What medication and dosage are you requesting samples for? dapagliflozin propanediol (FARXIGA) 10 MG TABS tablet rivaroxaban (XARELTO) 20 MG TABS tablet  2.  Are you currently out of this medication?  Pt is currently out of these meds. Pt has an appointment today at 11 am so he can pick the meds up after his appt

## 2020-10-04 ENCOUNTER — Other Ambulatory Visit: Payer: Medicare (Managed Care)

## 2020-10-04 ENCOUNTER — Telehealth: Payer: Self-pay | Admitting: Pharmacist

## 2020-10-04 NOTE — Telephone Encounter (Signed)
Pt called in regards to xarelto.  Pt moved appointment to Friday 10/06/20.  I offered to give him enough xarelto to last him until pharm d visit on Friday.  I stressed to him to make sure he comes to appointment.  He said he would and asked why I can't just give him enough for a month.  I explained to pt that we can not continue to supply him with medication.  He will need to bring in proof of income to Friday appointment so that he can apply for pt assistance.  He is hesitant that he will be able to bring in information.  14 days of xarelto left a front desk for pt to pick up.  He expressed that he would pick up medication today.

## 2020-10-04 NOTE — Telephone Encounter (Addendum)
Pt moved labs/appt from today to Friday 7/29.  Has been added as new Coumadin visit, not currently on Coumadin. Previously took Xarelto but was relying on samples, has Medicare but is unable to pay through $200 deductible so copay is cost prohibitive. He was denied for pt assistance for Xarelto since he has insurance. Looks like Raquel Sarna with social work has left messages for pt but he did not call back.  Prefer to avoid Coumadin as pt will be poor candidate due to compliance issues, drug use, alcohol use, and untreated bipolar with fear of leaving his house.  If pt shows on Friday, will need to fill out pt assistance for Eliquis. This will require pt to bring in proof of household income as well as out of pocket expense form from his pharmacy totaling his out of pocket drug costs. Pradaxa and Savaysa do not have pt assistance programs. He does not need his INR checked as he is not on warfarin and hopefully will not need to be.  He will also need appt with PharmD scheduled for CHF med management. There is mention that [facility] is out of network for pt, unclear if he will be following with [place] or other healthcare system in the future.  Also spoke with Westley Hummer, Education officer, museum. She advised that pt contact [place] at [phone] to see if he qualifies for low income subsidy to help with his medication costs. He can also go to the department of social services to see if he qualifies for Medicaid.

## 2020-10-04 NOTE — Telephone Encounter (Signed)
Discussed medication, patient assistance, and samples with pt at yesterday's office visit.  He was to follow up with coumadin clinic/ pharmacist today but rescheduled for 7/29.  I called pt and notified him that samples of xarelto were left at front desk for him to pick up.  Preferably today, he expressed that he would pick up meds today.

## 2020-10-05 ENCOUNTER — Telehealth: Payer: Self-pay | Admitting: Internal Medicine

## 2020-10-05 DIAGNOSIS — F3110 Bipolar disorder, current episode manic without psychotic features, unspecified: Secondary | ICD-10-CM

## 2020-10-05 NOTE — Telephone Encounter (Signed)
Patient would like recommendation's on a PCP. He states him and Dr. Gasper Sells discussed this at his last visit.

## 2020-10-05 NOTE — Telephone Encounter (Signed)
Referral placed for [facility]. Pt stated he wanted a clinic close to [facility].

## 2020-10-05 NOTE — Telephone Encounter (Signed)
Pt states he is in need of a primary care provider. He states he prefers to stay with [facility] regardless of insurance.   Will send to MD to see if he had anyone in mind.

## 2020-10-06 ENCOUNTER — Other Ambulatory Visit: Payer: Medicare (Managed Care) | Admitting: *Deleted

## 2020-10-06 ENCOUNTER — Other Ambulatory Visit: Payer: Self-pay

## 2020-10-06 ENCOUNTER — Telehealth: Payer: Self-pay | Admitting: Pharmacist

## 2020-10-06 DIAGNOSIS — I5042 Chronic combined systolic (congestive) and diastolic (congestive) heart failure: Secondary | ICD-10-CM

## 2020-10-06 DIAGNOSIS — I4892 Unspecified atrial flutter: Secondary | ICD-10-CM

## 2020-10-06 NOTE — Telephone Encounter (Signed)
Patient did not bring proof of income as was discussed before.  Printed out [employer] patient assistance application and put check marks next to paperwork he needs to provide for [place] to complete application.  Patient voiced understanding.  Reports he did make a PCP appt for 2 weeks however

## 2020-10-07 ENCOUNTER — Telehealth: Payer: Self-pay | Admitting: Physician Assistant

## 2020-10-07 LAB — BASIC METABOLIC PANEL
BUN/Creatinine Ratio: 16 (ref 10–24)
BUN: 23 mg/dL (ref 8–27)
CO2: 20 mmol/L (ref 20–29)
Calcium: 9.1 mg/dL (ref 8.6–10.2)
Chloride: 105 mmol/L (ref 96–106)
Creatinine, Ser: 1.47 mg/dL — ABNORMAL HIGH (ref 0.76–1.27)
Glucose: 85 mg/dL (ref 65–99)
Potassium: 5.8 mmol/L (ref 3.5–5.2)
Sodium: 142 mmol/L (ref 134–144)
eGFR: 54 mL/min/{1.73_m2} — ABNORMAL LOW (ref >59)

## 2020-10-07 NOTE — Telephone Encounter (Signed)
Received update from police officer Austin Cobb that they did establish contact with patient and he said there is nothing wrong with his heart and refused to go to the hospital. He told officers that he would call us later this morning. They did stress to him the importance of reaching out to [place] to get the necessary information from our office. They are no longer on scene with him. I tried to call him again but got his full VM once more. Await callback from patient. Austin Blyden PA-C

## 2020-10-07 NOTE — Telephone Encounter (Signed)
I received sign-out from A. Duke PA-C who received request from overnight fellow Dr. Clayton Bibles to reach out to patient this AM regarding elevated potassium level of 5.8. I tried to call patient but got VM x 3 - voicemail box is full but gave option to send SMS so I relayed answering service #. No DPR on file. Tried dialing pt contact's number listed, [name], but this number goes directly to VM that is not set up yet. Did not get response from patient after several minutes so I called [facility] to initiate welfare check on this patient. Care seems complicated by presence of bipolar disorder, mania, paranoia, potentially not taking medications correctly therefore I told 911 we would advise patient come to hospital for recheck labs and further management - if he refuses, at least needs to call us to discuss next steps which would include to hold Entresto/spironolactone. Per d/w Dr. Gasper Sells, if he absolutely refuses (previously was afraid to even leave his house), would need to hold these medicines pending further labs on Monday. Plan TBD based on contact with patient. Will cc to triage team as well in case we need to try to get repeat BMET per Dr. Gasper Sells on Monday.  Barnard Sharps PA-C

## 2020-10-08 NOTE — Telephone Encounter (Signed)
Still goes to VM. Will route to triage team to try again tomorrow to reach out.

## 2020-10-09 ENCOUNTER — Other Ambulatory Visit: Payer: Medicare (Managed Care) | Admitting: *Deleted

## 2020-10-09 ENCOUNTER — Telehealth: Payer: Self-pay | Admitting: Medical

## 2020-10-09 ENCOUNTER — Other Ambulatory Visit: Payer: Self-pay

## 2020-10-09 DIAGNOSIS — E875 Hyperkalemia: Secondary | ICD-10-CM

## 2020-10-09 LAB — BASIC METABOLIC PANEL
BUN/Creatinine Ratio: 16 (ref 10–24)
BUN: 20 mg/dL (ref 8–27)
CO2: 22 mmol/L (ref 20–29)
Calcium: 9 mg/dL (ref 8.6–10.2)
Chloride: 107 mmol/L — ABNORMAL HIGH (ref 96–106)
Creatinine, Ser: 1.28 mg/dL — ABNORMAL HIGH (ref 0.76–1.27)
Glucose: 89 mg/dL (ref 65–99)
Potassium: 5.3 mmol/L — ABNORMAL HIGH (ref 3.5–5.2)
Sodium: 139 mmol/L (ref 134–144)
eGFR: 64 mL/min/{1.73_m2} (ref >59)

## 2020-10-09 NOTE — Telephone Encounter (Signed)
Pt aware of lab results and the urgency to get rechecked Per pt will get transportation to bring to office today for repeat labs ./cy

## 2020-10-09 NOTE — Telephone Encounter (Signed)
   Patient called the after hours line requesting results from blood work today. He had repeat labs to monitor hyperkalemia which improved from 5.8>5.3. Given persistent elevation, albeit mild, favor holding spironolactone at this time. Recommend repeat BMET in 1 week for close monitoring. Patient was in agreement with the plan. Will route to Dr. Gasper Sells as an Juluis Rainier.  Abigail Butts, PA-C 10/09/20; 6:51 PM

## 2020-10-09 NOTE — Telephone Encounter (Signed)
Attempted phone call to pt.  Message received stating voicemail is full and unable to leave message.

## 2020-10-09 NOTE — Progress Notes (Signed)
Dr. Gasper Sells has ordered pt to have f/u labs today d/t K level of 5.8.  Per [facility] EMS gave pt instructions to come in today for lab work.

## 2020-10-10 NOTE — Addendum Note (Signed)
Addended by: Precious Gilding on: 10/10/2020 03:31 PM   Modules accepted: Orders

## 2020-10-10 NOTE — Telephone Encounter (Signed)
Called pt reviewed PA instructions.  He reports that I am repeating the same thing he was told last night.  I let him know that I was just ensuring he understands instructions.  He verbalizes understanding that he is to come in on Mon 10/16/20 for lab work. Order placed.

## 2020-10-16 ENCOUNTER — Emergency Department (HOSPITAL_COMMUNITY): Payer: Medicare (Managed Care)

## 2020-10-16 ENCOUNTER — Other Ambulatory Visit: Payer: Self-pay

## 2020-10-16 ENCOUNTER — Emergency Department (HOSPITAL_COMMUNITY)
Admission: EM | Admit: 2020-10-16 | Discharge: 2020-10-16 | Disposition: A | Discharge status: Home / Self Care | Payer: Medicare (Managed Care) | Attending: Emergency Medicine | Admitting: Emergency Medicine

## 2020-10-16 ENCOUNTER — Encounter (HOSPITAL_COMMUNITY): Payer: Self-pay

## 2020-10-16 DIAGNOSIS — Z79899 Other long term (current) drug therapy: Secondary | ICD-10-CM | POA: Diagnosis not present

## 2020-10-16 DIAGNOSIS — W010XXA Fall on same level from slipping, tripping and stumbling without subsequent striking against object, initial encounter: Secondary | ICD-10-CM | POA: Diagnosis not present

## 2020-10-16 DIAGNOSIS — F17210 Nicotine dependence, cigarettes, uncomplicated: Secondary | ICD-10-CM | POA: Diagnosis not present

## 2020-10-16 DIAGNOSIS — Y9389 Activity, other specified: Secondary | ICD-10-CM | POA: Insufficient documentation

## 2020-10-16 DIAGNOSIS — S90931A Unspecified superficial injury of right great toe, initial encounter: Secondary | ICD-10-CM | POA: Diagnosis present

## 2020-10-16 DIAGNOSIS — S90411A Abrasion, right great toe, initial encounter: Secondary | ICD-10-CM | POA: Diagnosis not present

## 2020-10-16 DIAGNOSIS — Y9289 Other specified places as the place of occurrence of the external cause: Secondary | ICD-10-CM | POA: Insufficient documentation

## 2020-10-16 DIAGNOSIS — M25571 Pain in right ankle and joints of right foot: Secondary | ICD-10-CM | POA: Diagnosis not present

## 2020-10-16 DIAGNOSIS — I4891 Unspecified atrial fibrillation: Secondary | ICD-10-CM | POA: Insufficient documentation

## 2020-10-16 DIAGNOSIS — M79671 Pain in right foot: Secondary | ICD-10-CM | POA: Insufficient documentation

## 2020-10-16 DIAGNOSIS — I5042 Chronic combined systolic (congestive) and diastolic (congestive) heart failure: Secondary | ICD-10-CM | POA: Diagnosis not present

## 2020-10-16 DIAGNOSIS — Z7901 Long term (current) use of anticoagulants: Secondary | ICD-10-CM | POA: Diagnosis not present

## 2020-10-16 DIAGNOSIS — I110 Hypertensive heart disease with heart failure: Secondary | ICD-10-CM | POA: Insufficient documentation

## 2020-10-16 DIAGNOSIS — I4892 Unspecified atrial flutter: Secondary | ICD-10-CM

## 2020-10-16 LAB — CBC WITH DIFFERENTIAL/PLATELET
Abs Immature Granulocytes: 0.02 10*3/uL (ref 0.00–0.07)
Basophils Absolute: 0.1 10*3/uL (ref 0.0–0.1)
Basophils Relative: 1 %
Eosinophils Absolute: 0.2 10*3/uL (ref 0.0–0.5)
Eosinophils Relative: 3 %
HCT: 30.7 % — ABNORMAL LOW (ref 39.0–52.0)
Hemoglobin: 10.4 g/dL — ABNORMAL LOW (ref 13.0–17.0)
Immature Granulocytes: 0 %
Lymphocytes Relative: 18 %
Lymphs Abs: 1.4 10*3/uL (ref 0.7–4.0)
MCH: 31.6 pg (ref 26.0–34.0)
MCHC: 33.9 g/dL (ref 30.0–36.0)
MCV: 93.3 fL (ref 80.0–100.0)
Monocytes Absolute: 0.8 10*3/uL (ref 0.1–1.0)
Monocytes Relative: 10 %
Neutro Abs: 5.4 10*3/uL (ref 1.7–7.7)
Neutrophils Relative %: 68 %
Platelets: 156 10*3/uL (ref 150–400)
RBC: 3.29 MIL/uL — ABNORMAL LOW (ref 4.22–5.81)
RDW: 14.4 % (ref 11.5–15.5)
WBC: 7.9 10*3/uL (ref 4.0–10.5)
nRBC: 0 % (ref 0.0–0.2)

## 2020-10-16 LAB — COMPREHENSIVE METABOLIC PANEL
ALT: 10 U/L (ref 0–44)
AST: 18 U/L (ref 15–41)
Albumin: 3.2 g/dL — ABNORMAL LOW (ref 3.5–5.0)
Alkaline Phosphatase: 73 U/L (ref 38–126)
Anion gap: 5 (ref 5–15)
BUN: 23 mg/dL (ref 8–23)
CO2: 19 mmol/L — ABNORMAL LOW (ref 22–32)
Calcium: 8.2 mg/dL — ABNORMAL LOW (ref 8.9–10.3)
Chloride: 110 mmol/L (ref 98–111)
Creatinine, Ser: 1.38 mg/dL — ABNORMAL HIGH (ref 0.61–1.24)
GFR, Estimated: 58 mL/min — ABNORMAL LOW (ref >60)
Glucose, Bld: 172 mg/dL — ABNORMAL HIGH (ref 70–99)
Potassium: 4.1 mmol/L (ref 3.5–5.1)
Sodium: 134 mmol/L — ABNORMAL LOW (ref 135–145)
Total Bilirubin: 0.9 mg/dL (ref 0.3–1.2)
Total Protein: 6.2 g/dL — ABNORMAL LOW (ref 6.5–8.1)

## 2020-10-16 LAB — TROPONIN I (HIGH SENSITIVITY)
Troponin I (High Sensitivity): 8 ng/L (ref <18)
Troponin I (High Sensitivity): 8 ng/L (ref <18)

## 2020-10-16 LAB — POC OCCULT BLOOD, ED: Fecal Occult Bld: NEGATIVE

## 2020-10-16 MED ORDER — CARVEDILOL 12.5 MG PO TABS
25.0000 mg | ORAL_TABLET | Freq: Once | ORAL | Status: AC
  Administered 2020-10-16: 25 mg via ORAL
  Filled 2020-10-16: qty 2

## 2020-10-16 MED ORDER — LACTATED RINGERS IV BOLUS
1000.0000 mL | Freq: Once | INTRAVENOUS | Status: AC
  Administered 2020-10-16: 1000 mL via INTRAVENOUS

## 2020-10-16 MED ORDER — HYDROCODONE-ACETAMINOPHEN 5-325 MG PO TABS
1.0000 | ORAL_TABLET | Freq: Once | ORAL | Status: AC
  Administered 2020-10-16: 1 via ORAL
  Filled 2020-10-16: qty 1

## 2020-10-16 NOTE — ED Notes (Signed)
Pt was able to ambulate at d/c with post op boot. Pt GCS 15. Pt was taken to restroom before going out to waiting room. Pt d/c'd via wheelchair.

## 2020-10-16 NOTE — ED Notes (Signed)
X RAY at bedside 

## 2020-10-16 NOTE — ED Notes (Signed)
ED resident gave pt food/gingerale.

## 2020-10-16 NOTE — ED Triage Notes (Signed)
Fell yesterday through [employer] C/o R foot/knee pain and L thigh pain. Also c/o dizziness x2 weeks. When EMS arrived was in A flutter in the 120s.

## 2020-10-16 NOTE — ED Notes (Signed)
Pt HR elevated right now because he's sitting up in bed and eating.

## 2020-10-16 NOTE — ED Notes (Signed)
Pt AxO x4. Has some abrasions/lacerations to R foot. Bleeding controlled. HR between 90s-110s. A fib. Denies CP. Has some abdominal pain. Male temporary external catheter placed.

## 2020-10-16 NOTE — ED Provider Notes (Signed)
[facility] EMERGENCY DEPARTMENT Provider Note   CSN: [id number] Arrival date & time: 10/16/20  1751     History Chief Complaint  Patient presents with   Fall   Dizziness   Atrial Flutter    Austin Cobb is a 60 y.o. male.  Patient is a 60 year old male with a past medical history of atrial flutter on anticoagulation, bipolar disorder, hypertension who is presenting for right foot pain and dizziness for 2 weeks.  Patient states that he was on his porch last night when his right foot slipped through the porch floor.  He is complaining of right ankle pain. Patient has an abrasion to his big toe. He denies any numbness or weakness. He has no other complaints.   Patient was found to be in atrial flutter on arrival by EMS. He denies any chest pain or shortness of breath. He states that he has been taking his blood thinner off and on. He denies any lightheadedness currently.   Fall The current episode started yesterday. Pertinent negatives include no chest pain, no abdominal pain, no headaches and no shortness of breath. Associated symptoms comments: Right foot pain. The symptoms are aggravated by walking. The symptoms are relieved by rest.  Dizziness Associated symptoms: no chest pain, no diarrhea, no headaches, no hearing loss, no nausea, no palpitations, no shortness of breath and no vomiting       Past Medical History:  Diagnosis Date   Anxiety    Arthritis    Bipolar 1 disorder ([place])    Depression    Bipolar, not on meds/pt. stopped meds on own over 12 yrs ago   GERD (gastroesophageal reflux disease)    History of colon polyps    Manic disorder ([place])    Noncompliance 05/14/2019   Substance abuse ([place])    62yr ago smoke crack and marjuana   Tobacco abuse 05/13/2012    Patient Active Problem List   Diagnosis Date Noted   Atrial flutter, unspecified type ([place] 10/03/2020   Alcohol use disorder, severe, dependence ([place] 08/11/2020   Cocaine use disorder  ([place] 08/11/2020   Atrial flutter ([facility] 07/31/2020   Heparin induced thrombocytopenia ([place] 05/25/2019   Protein-calorie malnutrition, severe 05/20/2019   Yeast UTI 05/15/2019   Alcohol withdrawal ([facility] [id number]  Acute metabolic encephalopathy [id number]  AKI (acute kidney injury) ([place] 05/14/2019   Elevated d-dimer 05/14/2019   Demand ischemia ([facility] 05/14/2019   Elevated troponin 05/14/2019   Pleural effusion 05/14/2019   Polysubstance abuse ([facility] 05/14/2019   E. coli UTI 05/14/2019   Agitation 05/14/2019   Noncompliance 05/14/2019   Chronic combined systolic and diastolic heart failure (HCC)    Atrial flutter with rapid ventricular response ([place] 05/04/2019   Essential hypertension    Herpes zoster without complication 00000000  History of fall 07/23/2017   Prostatitis 06/23/2014   Serrated adenoma of colon 05/13/2012   Anal fissure 05/13/2012   Tobacco abuse 05/13/2012   Chronic shoulder pain 01/03/2012   Neck pain, chronic 01/03/2012   Mood disorder ([place] 01/03/2012   Depression with anxiety 01/03/2012    Past Surgical History:  Procedure Laterality Date   CARDIOVERSION N/A 05/06/2019   Procedure: CARDIOVERSION;  Surgeon: NAcie Fredrickson PWonda Cheng MD;  [facility]: [facility]  Service: Cardiovascular;  Laterality: N/A;   CARDIOVERSION N/A 05/20/2019   Procedure: CARDIOVERSION;  Surgeon: MLarey Dresser MD;  Location: [facility]  Service: Cardiovascular;  Laterality: N/A;   EXAMINATION UNDER ANESTHESIA N/A 11/12/2012  Procedure: EXAM UNDER ANESTHESIA;  Surgeon: Adin Hector, MD;  Location: [facility];  Service: General;  Laterality: N/A;   EYE SURGERY     bilateral eye sx as a child [age]yr old.   RIGHT/LEFT HEART CATH AND CORONARY ANGIOGRAPHY N/A 05/18/2019   Procedure: RIGHT/LEFT HEART CATH AND CORONARY ANGIOGRAPHY;  Surgeon: MLarey Dresser MD;  [facility];  Service: Cardiovascular;  Laterality: N/A;   SPHINCTEROTOMY N/A 11/12/2012   Procedure:  SPHINCTEROTOMY;  Surgeon: SAdin Hector MD;  [facility];  Service: General;  Laterality: N/A;   TEE WITHOUT CARDIOVERSION N/A 05/06/2019   Procedure: TRANSESOPHAGEAL ECHOCARDIOGRAM (TEE);  Surgeon: NAcie FredricksonPWonda Cheng MD;  [place]  Service: Cardiovascular;  Laterality: N/A;   TEE WITHOUT CARDIOVERSION N/A 05/20/2019   Procedure: TRANSESOPHAGEAL ECHOCARDIOGRAM (TEE);  Surgeon: MLarey Dresser MD;  [facility];  Service: Cardiovascular;  Laterality: N/A;   TONSILLECTOMY         No family history on file.  Social History   Tobacco Use   Smoking status: Every Day    Packs/day: 0.50    Years: 35.00    Pack years: 17.50    Types: Cigarettes   Smokeless tobacco: Never  Substance Use Topics   Alcohol use: Yes    Alcohol/week: 4.0 standard drinks    Types: 4 Cans of beer per week    Comment: 4 cans of beer a day    Drug use: Yes    Types: Cocaine    Comment: sometimes    Home Medications Prior to Admission medications   Medication Sig Start Date End Date Taking? Authorizing Provider  carvedilol (COREG) 25 MG tablet Take 1 tablet (25 mg total) by mouth 2 (two) times daily with a meal. 09/06/20  Yes SDonato Heinz MD  dapagliflozin propanediol (FARXIGA) 10 MG TABS tablet Take 1 tablet (10 mg total) by mouth daily before breakfast. 09/19/20  Yes SDonato Heinz MD  rivaroxaban (XARELTO) 20 MG TABS tablet Take 1 tablet (20 mg total) by mouth daily with supper. 09/19/20  Yes SDonato Heinz MD  sacubitril-valsartan (ENTRESTO) 24-26 MG Take 1 tablet by mouth 2 (two) times daily. 09/19/20 11/18/20 Yes SDonato Heinz MD  cholecalciferol (VITAMIN D3) 25 MCG (1000 UNIT) tablet Take 2,000 Units by mouth daily. Patient not taking: No sig reported    [provider]  feeding supplement, ENSURE ENLIVE, (ENSURE ENLIVE) LIQD Take 237 mLs by mouth in the morning, at noon, in the evening, and at bedtime. Patient not taking: No  sig reported 05/25/19   FCharlynne Cousins MD  ferrous sulfate 325 (65 FE) MG tablet Take 1 tablet (324 mg total) by mouth daily with breakfast. Patient not taking: No sig reported 08/04/20   GCaren Griffins MD  lactulose (CHRONULAC) 10 GM/15ML solution Take 30 mLs (20 g total) by mouth 2 (two) times daily. Patient not taking: No sig reported 05/25/19   FCharlynne Cousins MD  Multiple Vitamin (MULTIVITAMIN WITH MINERALS) TABS tablet Take 1 tablet by mouth daily. Patient not taking: No sig reported 05/25/19   FCharlynne Cousins MD  spironolactone (ALDACTONE) 25 MG tablet Take 1 tablet (25 mg total) by mouth daily. Patient not taking: No sig reported 09/06/20   SDonato Heinz MD    Allergies    Adhesive [tape]  Review of Systems   Review of Systems  Constitutional:  Negative for chills, diaphoresis, fatigue and fever.  HENT:  Negative for  congestion, dental problem, ear pain, facial swelling, hearing loss, nosebleeds, postnasal drip, rhinorrhea, sore throat and trouble swallowing.   Eyes:  Negative for photophobia, pain and visual disturbance.  Respiratory:  Negative for apnea, cough, choking, chest tightness, shortness of breath, wheezing and stridor.   Cardiovascular:  Negative for chest pain, palpitations and leg swelling.  Gastrointestinal:  Negative for abdominal distention, abdominal pain, constipation, diarrhea, nausea and vomiting.  Endocrine: Negative for polydipsia and polyuria.  Genitourinary:  Negative for difficulty urinating, dysuria, flank pain, frequency, hematuria and urgency.  Musculoskeletal:  Negative for gait problem, myalgias, neck pain and neck stiffness.       Right foot pain  Skin:  Negative for rash and wound.  Allergic/Immunologic: Negative for environmental allergies and food allergies.  Neurological:  Positive for dizziness. Negative for tremors, seizures, syncope, facial asymmetry, speech difficulty, light-headedness, numbness and headaches.   Psychiatric/Behavioral:  Negative for behavioral problems and confusion.   All other systems reviewed and are negative.  Physical Exam Updated Vital Signs BP (!) 130/92   Pulse (!) 109   Temp 98.3 F (36.8 C) (Oral)   Resp 20   Ht '5\' 10"'$  (1.778 m)   Wt 59 kg   SpO2 95%   BMI 18.65 kg/m   Physical Exam Vitals and nursing note reviewed. Exam conducted with a chaperone present.  Constitutional:      General: He is not in acute distress.    Appearance: Normal appearance. He is normal weight.  HENT:     Head: Normocephalic and atraumatic.     Right Ear: External ear normal.     Left Ear: External ear normal.     Nose: Nose normal. No congestion.     Mouth/Throat:     Mouth: Mucous membranes are moist.     Pharynx: Oropharynx is clear. No oropharyngeal exudate or posterior oropharyngeal erythema.  Eyes:     General: No visual field deficit.    Extraocular Movements: Extraocular movements intact.     Conjunctiva/sclera: Conjunctivae normal.     Pupils: Pupils are equal, round, and reactive to light.  Cardiovascular:     Rate and Rhythm: Normal rate and regular rhythm.     Pulses: Normal pulses.     Heart sounds: Normal heart sounds. No murmur heard.   No friction rub. No gallop.  Pulmonary:     Effort: Pulmonary effort is normal. No respiratory distress.     Breath sounds: Normal breath sounds. No stridor. No wheezing, rhonchi or rales.  Chest:     Chest wall: No tenderness.  Abdominal:     General: Abdomen is flat. Bowel sounds are normal. There is no distension.     Palpations: Abdomen is soft.     Tenderness: There is no abdominal tenderness. There is no right CVA tenderness, left CVA tenderness, guarding or rebound.  Genitourinary:    Rectum: Normal. Guaiac result negative. No tenderness, external hemorrhoid or internal hemorrhoid.  Musculoskeletal:        General: No swelling. Normal range of motion.     Cervical back: Normal range of motion and neck supple. No  rigidity, tenderness or bony tenderness.     Thoracic back: Normal. No tenderness or bony tenderness.     Lumbar back: Normal. No tenderness or bony tenderness.     Right lower leg: Normal. No edema.     Left lower leg: Normal. No edema.     Right ankle: Normal. No swelling. No tenderness. Normal pulse.  Left ankle: Normal.     Right foot: Tenderness present. No swelling, deformity, bony tenderness or crepitus. Normal pulse.     Left foot: Normal. Normal pulse.     Comments: Abrasion to right big toe  Skin:    General: Skin is warm and dry.  Neurological:     General: No focal deficit present.     Mental Status: He is alert and oriented to person, place, and time. Mental status is at baseline.     Cranial Nerves: Cranial nerves are intact. No cranial nerve deficit, dysarthria or facial asymmetry.     Sensory: Sensation is intact. No sensory deficit.     Motor: Motor function is intact. No weakness.     Coordination: Coordination is intact. Finger-Nose-Finger Test normal.     Gait: Gait is intact. Gait normal.  Psychiatric:        Mood and Affect: Mood normal.        Behavior: Behavior normal.        Thought Content: Thought content normal.        Judgment: Judgment normal.    ED Results / Procedures / Treatments   Labs (all labs ordered are listed, but only abnormal results are displayed) Labs Reviewed  CBC WITH DIFFERENTIAL/PLATELET - Abnormal; Notable for the following components:      Result Value   RBC 3.29 (*)    Hemoglobin 10.4 (*)    HCT 30.7 (*)    All other components within normal limits  COMPREHENSIVE METABOLIC PANEL - Abnormal; Notable for the following components:   Sodium 134 (*)    CO2 19 (*)    Glucose, Bld 172 (*)    Creatinine, Ser 1.38 (*)    Calcium 8.2 (*)    Total Protein 6.2 (*)    Albumin 3.2 (*)    GFR, Estimated 58 (*)    All other components within normal limits  POC OCCULT BLOOD, ED  TROPONIN I (HIGH SENSITIVITY)  TROPONIN I (HIGH  SENSITIVITY)    EKG EKG Interpretation  Date/Time:  Monday October 16 2020 17:57:41 EDT Ventricular Rate:  111 PR Interval:    QRS Duration: 141 QT Interval:  369 QTC Calculation: 472 R Axis:   38 Text Interpretation: Atrial flutter Probable anterior infarct, age indeterminate Abnormal T, consider ischemia, inferior leads No significant change since last tracing Confirmed by Isla Pence [id number]) on 10/16/2020 9:44:45 PM  Radiology DG Knee 2 Views Right  Result Date: 10/16/2020 CLINICAL DATA:  Knee pain EXAM: RIGHT KNEE - 1-2 VIEW COMPARISON:  None. FINDINGS: No evidence of fracture, dislocation, or joint effusion. No evidence of arthropathy or other focal bone abnormality. Soft tissues are unremarkable. IMPRESSION: Negative. Electronically Signed   By: Donavan Foil M.D.   On: 10/16/2020 18:54   DG Tibia/Fibula Right  Result Date: 10/16/2020 CLINICAL DATA:  Leg pain with fall EXAM: RIGHT TIBIA AND FIBULA - 2 VIEW COMPARISON:  None. FINDINGS: There is no evidence of fracture or other focal bone lesions. Soft tissues are unremarkable. IMPRESSION: Negative. Electronically Signed   By: Donavan Foil M.D.   On: 10/16/2020 18:53   DG Chest Portable 1 View  Result Date: 10/16/2020 CLINICAL DATA:  Atrial flutter.  Fall. EXAM: PORTABLE CHEST 1 VIEW COMPARISON:  None. FINDINGS: Heart and mediastinal contours are within normal limits. No focal opacities or effusions. No acute bony abnormality. IMPRESSION: No active disease. Electronically Signed   By: Rolm Baptise M.D.   On: 10/16/2020 18:53  DG Foot 2 Views Right  Result Date: 10/16/2020 CLINICAL DATA:  Foot pain EXAM: RIGHT FOOT - 2 VIEW COMPARISON:  None. FINDINGS: There is no evidence of fracture or dislocation. There is no evidence of arthropathy or other focal bone abnormality. Soft tissues are unremarkable. IMPRESSION: Negative. Electronically Signed   By: Donavan Foil M.D.   On: 10/16/2020 18:54    Procedures Procedures   Medications  Ordered in ED Medications  HYDROcodone-acetaminophen (NORCO/VICODIN) 5-325 MG per tablet 1 tablet (1 tablet Oral Given 10/16/20 2206)  lactated ringers bolus 1,000 mL (0 mLs Intravenous Stopped 10/16/20 2253)  carvedilol (COREG) tablet 25 mg (25 mg Oral Given 10/16/20 2254)    ED Course  I have reviewed the triage vital signs and the nursing notes.  Pertinent labs & imaging results that were available during my care of the patient were reviewed by me and considered in my medical decision making (see chart for details).    MDM Rules/Calculators/A&P                         Austin Cobb is a 60 y.o. male with a past medical history of atrial flutter on anticoagulation, bipolar disorder, hypertension who is presenting for right foot pain and dizziness for 2 weeks. Patient's EKG is notable for him being in atrial flutter. Patient has been noncompliant with his blood thinner use that is why we did not perform a cardioversion. We will plan on him following up with his cardiologist. His troponin x2 were negative. His CBC was notable for a hemoglobin of 10.4. A hemoocult was negative and he denies any hematuria or spitting up blood.   He endorses cocaine and alcohol abuse.   CXR shows no acute cardiac or pulmonary abnormality.   X-ray of the patient's left foot, ankle, tib-fib, knee showed no fracture. Patient was given a norco and had pain relief. He was able to ambulate without complications. He said that he was not longer in pain. He was given a post op shoe for support for his right foot.   For his tachycardia, he was given IV fluids. He was given a home dose of his carvedilol and his heart rate improved.   He is going to follow up with his cardiologist and PCP. No need for any other emergent labs or imaging at this time.   Patient states compliance and understanding of the plan. I explained labs and imaging to the patient. No further questions at this time from the patient.  The patient is  safe and stable for discharge at this time with return precautions provided and a plan for follow-up care in place as needed  The plan for this patient was discussed with Dr. Gilford Raid, who voiced agreement and who oversaw evaluation and treatment of this patient.   Final Clinical Impression(s) / ED Diagnoses Final diagnoses:  Right ankle pain  Atrial flutter, unspecified type [facility])    Rx / DC Orders ED Discharge Orders     None        Doretha Sou, MD 10/17/20 Greggory Keen    Isla Pence, MD 10/17/20 1726

## 2020-10-16 NOTE — Discharge Instructions (Addendum)
Please follow up with your cardiologist and PCP. Wear the post op shoes as needed for your right foot pain.

## 2020-10-18 NOTE — Telephone Encounter (Signed)
Patient is following up--He states he has questions regarding the patient assistance for Entresto.

## 2020-10-18 NOTE — Telephone Encounter (Signed)
Patient declined CHF appointment at this time.  Thought he only had a lab appointment and only wanted to discuss patient assistance paperwork.

## 2020-10-18 NOTE — Telephone Encounter (Signed)
Left detailed message for patient  that I am returning call and that patient should have received a letter of notification from [employer] regarding approval for Entresto through 03/10/21. Advised him to call back with additional questions.

## 2020-10-19 ENCOUNTER — Telehealth: Payer: Self-pay | Admitting: Internal Medicine

## 2020-10-19 NOTE — Telephone Encounter (Signed)
Pt c/o medication issue:  1. Name of Medication: sacubitril-valsartan (ENTRESTO) 24-26 MG  2. How are you currently taking this medication (dosage and times per day)? 1 table by mouth 2 times a day  3. Are you having a reaction (difficulty breathing--STAT)? no  4. What is your medication issue? Calling to get the information on the company that helps him get the medication because he is unable to [facility] at [facility]. He saying [facility] need to know who to bill for the medication. Patient is out of the medicaiton. Please advise

## 2020-10-20 NOTE — Telephone Encounter (Signed)
**Note De-Identified  Obfuscation** The pt states that [facility] (his ins), his pharmacy or no one else knows anything about his [name] or where it is. I advised him that he was approved for asst with or his [name] and that I received a letter from them on 7/15 and he should have as well per [employer] pt asst [employer].  He stated "I have not checked my mail in a while but I guess I should".  I gave him [employer] pt asst [facility]'s phone number and advised him to call them to get set up with their pharmacy and they will start mailing him his Entresto. I also advised him that we are leaving him 1 LAST bottle of Entresto samples for him to pick up from the front office at [facility] office as our samples are limited and meant for pts that are first starting a medication.  Austin Cobb advised me that he ran out of Xarelto as he took his last one yesterday. I advised him that [employer] denied him for asst because he has ins coverage for it and that I received that letter on 7/20. The pt stated that he was aware that he was denied for Xarelto.  I advised him of the [employer] where he can get his Xarelto for $85/30 day supply or $240/90 day supply but he stated "I want to ponder on this for a while".  I advised him that he does not have time to think about this because he is out of Xarelto and is not to miss any doses.  He said "well, then you guys will have to give me some because Im out and the only way I ever get Xarelto or [name] is when I either go to the hospital or a MDs office". He also stated "that's why I have to go to the hospital because I run out of my meds and start to feel bad".  He insisted (loudly) that he has been taking Xarelto and Entresto for a year and a half but cannot tell me where he has been getting it other than MDs offices and the hospital.  He did state that he was receiving Entresto from the manufacturer but later denied this when I asked questions.  I advised him that he  cannot take his medications intermittently as that is non therapeutic and dangerous to do and that we have been giving him samples to help him out but if he cannot afford it anfd was denied pt asst we will need to consider switching him to Warfarin.  He became louder and started taking over me stating that I woke him up and that he is not in the mood for this crap this morning". He did cuss several times throughout our conversation.  I advised him (Im sure he did not hear me because he continued to talk over me very loudly) that I am forwarding this message to Austin Cobb and our pharmacy team and that someone will be calling him back to discuss his anticoagulation medication options.  I was on this call for 28+mins.

## 2020-10-20 NOTE — Telephone Encounter (Signed)
Is patient enrolled in patient assistance for Entresto?  If is not, Dr C put on his appointment note that he can be changed to losartan '25mg'$  daily

## 2020-10-25 ENCOUNTER — Other Ambulatory Visit (HOSPITAL_COMMUNITY): Payer: Self-pay

## 2020-10-25 ENCOUNTER — Telehealth: Payer: Self-pay | Admitting: Licensed Clinical Social Worker

## 2020-10-25 NOTE — Telephone Encounter (Signed)
CSW received referral to assist patient with financial assistance. Patient was denied patient assistance through [employer] for his Xarelto. CSW with assistance of HF Pharmacist explored options and apparently patient has a $237 out of pocket expense this month and going forward will only have a monthly co pay of $47. CSW also discussed with patient to explore obtaining an income verification from social security to resubmit to the Patient Assistance as his previous was from a joint account bank statement with his lawyer (rep payee for SSD benefits). CSW will assist with payment for medications through the Patient [place] and drop off to patient. CSW available as needed. Raquel Sarna, [place], [place]

## 2020-11-06 ENCOUNTER — Other Ambulatory Visit (HOSPITAL_COMMUNITY): Payer: Self-pay

## 2020-11-15 ENCOUNTER — Other Ambulatory Visit (HOSPITAL_COMMUNITY): Payer: Self-pay

## 2020-11-15 ENCOUNTER — Telehealth (HOSPITAL_COMMUNITY): Payer: Self-pay | Admitting: Licensed Clinical Social Worker

## 2020-11-15 NOTE — Telephone Encounter (Signed)
Patient called to share he obtained the needed documents to complete his Patient Assistance Application. Patient instructed to drop off documents to CSW at the [facility] center. Patient verbalizes understanding and will follow up asap. Raquel Sarna, [place], [place]

## 2020-11-15 NOTE — Telephone Encounter (Signed)
CSW received patient's SSD income report but will still need the out of pocket expense report from the pharmacy to complete the application for Xarelto. Patient states he will return to the pharmacy to obtain the report and bring to [place]. CSW will follow up once the expense reports is obtained. At this time, the patient has met his deductible for the insurance and will have to pay $47 monthly for the medication. Patient verbalizes understanding and will follow up. Raquel Sarna, [place], [place]

## 2020-11-20 ENCOUNTER — Telehealth: Payer: Self-pay | Admitting: Licensed Clinical Social Worker

## 2020-11-20 NOTE — Telephone Encounter (Signed)
CSW contacted patient to follow up on medication assistance and obtaining the pharmacy expenses sheet. Patient states he is doing ok but has not received the pharmacy expense. Patient has an upcoming appointment and shared that he is struggling with finances. CSW assured patient had transportation and assistance for his co pay for upcoming appointment to assure adherence. Patient verbalizes understanding and will see CSW at office visit. Raquel Sarna, [place], [place]

## 2020-11-21 NOTE — Progress Notes (Signed)
Cardiology Office Note:    Date:  11/22/2020   ID:  Austin Cobb, DOB 06-05-1960, MRN [id number]  PCP:  Patient, No Pcp Per (Inactive)   [facility] Providers Cardiologist:  Werner Lean, MD     Referring MD: No ref. provider found   CC:  AHF f/u  History of Present Illness:    LADARIEN BRAAM is a 60 y.o. male with a hx of AFL, HFrEF, Hx of polysubstance abuse (crack-cocaine, marijuana), bipolar current alcohol use who presents for evaluation 10/03/20.  In follow up, we were unclear on his medications.  Had hyperkalemia with [name]- believed it was his blood thinners.  We were unable to reach him and he had an EMS safety visit.  He has submitted his medications as a safety visit for Entresto.  Seen 11/22/20.  Patient notes that he is doing better.  Feels less nervous.  No shakes.  Energy is feeling better.  Notes that he prefers a natural way of doing things.    No chest pain or pressure .  No SOB/DOE and no PND/Orthopnea.  No weight gain or leg swelling.  No palpitations or syncope.  He is able to do his ADLs and light house work.  He no longer has a resting tremor.  He has made dietary changes and is optimistic about his potassium.   Past Medical History:  Diagnosis Date   Anxiety    Arthritis    Bipolar 1 disorder ([place])    Depression    Bipolar, not on meds/pt. stopped meds on own over 12 yrs ago   GERD (gastroesophageal reflux disease)    History of colon polyps    Manic disorder ([place])    Noncompliance 05/14/2019   Substance abuse ([place])    30yr ago smoke crack and marjuana   Tobacco abuse 05/13/2012    Past Surgical History:  Procedure Laterality Date   CARDIOVERSION N/A 05/06/2019   Procedure: CARDIOVERSION;  Surgeon: Nahser, PWonda Cheng MD;  [place] [facility]  Service: Cardiovascular;  Laterality: N/A;   CARDIOVERSION N/A 05/20/2019   Procedure: CARDIOVERSION;  Surgeon: MLarey Dresser MD;  [place] [facility]  Service: Cardiovascular;   Laterality: N/A;   EXAMINATION UNDER ANESTHESIA N/A 11/12/2012   Procedure: EJasmine DecemberUNDER ANESTHESIA;  Surgeon: SAdin Hector MD;  [facility];  Service: General;  Laterality: N/A;   EYE SURGERY     bilateral eye sx as a child [id number].   RIGHT/LEFT HEART CATH AND CORONARY ANGIOGRAPHY N/A 05/18/2019   Procedure: RIGHT/LEFT HEART CATH AND CORONARY ANGIOGRAPHY;  Surgeon: McLarey DresserMD;  [facility];  Service: Cardiovascular;  Laterality: N/A;   SPHINCTEROTOMY N/A 11/12/2012   Procedure: SPHINCTEROTOMY;  Surgeon: StAdin HectorMD;  [facility];  Service: General;  Laterality: N/A;   TEE WITHOUT CARDIOVERSION N/A 05/06/2019   Procedure: TRANSESOPHAGEAL ECHOCARDIOGRAM (TEE);  Surgeon: NaAcie FredricksonhWonda ChengMD;  [place] [facility] Service: Cardiovascular;  Laterality: N/A;   TEE WITHOUT CARDIOVERSION N/A 05/20/2019   Procedure: TRANSESOPHAGEAL ECHOCARDIOGRAM (TEE);  Surgeon: McLarey DresserMD;  [facility]  Service: Cardiovascular;  Laterality: N/A;   TONSILLECTOMY      Current Medications: Current Meds  Medication Sig   cholecalciferol (VITAMIN D3) 25 MCG (1000 UNIT) tablet Take 2,000 Units by mouth daily.   dapagliflozin propanediol (FARXIGA) 10 MG TABS tablet Take 1 tablet (10 mg total) by mouth daily before breakfast.   feeding supplement, ENSURE ENLIVE, (ENSURE  ENLIVE) LIQD Take 237 mLs by mouth in the morning, at noon, in the evening, and at bedtime.   ferrous sulfate 325 (65 FE) MG tablet Take 1 tablet (324 mg total) by mouth daily with breakfast.   lactulose (CHRONULAC) 10 GM/15ML solution Take 30 mLs (20 g total) by mouth 2 (two) times daily.   Multiple Vitamin (MULTIVITAMIN WITH MINERALS) TABS tablet Take 1 tablet by mouth daily.   rivaroxaban (XARELTO) 20 MG TABS tablet Take 1 tablet (20 mg total) by mouth daily with supper.   spironolactone (ALDACTONE) 25 MG tablet Take 1 tablet (25 mg total) by mouth daily.     Allergies:   Adhesive  [tape]   Social History   Socioeconomic History   Marital status: Divorced    Spouse name: Not on file   Number of children: Not on file   Years of education: Not on file   Highest education level: Not on file  Occupational History   Not on file  Tobacco Use   Smoking status: Every Day    Packs/day: 0.50    Years: 35.00    Pack years: 17.50    Types: Cigarettes   Smokeless tobacco: Never  Substance and Sexual Activity   Alcohol use: Yes    Alcohol/week: 4.0 standard drinks    Types: 4 Cans of beer per week    Comment: 4 cans of beer a day    Drug use: Yes    Types: Cocaine    Comment: sometimes   Sexual activity: Never  Other Topics Concern   Not on file  Social History Narrative   Not on file   Social Determinants of Health   Financial Resource Strain: Low Risk    Difficulty of Paying Living Expenses: Not very hard  Food Insecurity: No Food Insecurity   Worried About Charity fundraiser in the Last Year: Never true   Ran Out of Food in the Last Year: Never true  Transportation Needs: Unmet Transportation Needs   Lack of Transportation (Medical): No   Lack of Transportation (Non-Medical): Yes  Physical Activity: Not on file  Stress: Not on file  Social Connections: Not on file    Social:  Has a care provider in her house [name])  Family History: History of coronary artery disease notable for no members. History of heart failure notable for no members. History of arrhythmia notable for no members.  ROS:   Please see the history of present illness.     All other systems reviewed and are negative.  EKGs/Labs/Other Studies Reviewed:    The following studies were reviewed today:  EKG:  10/03/20: AFL rate 80  Transthoracic Echocardiogram: Date: 08/01/20 Results: 1. Left ventricular ejection fraction, by estimation, is <20%. The left  ventricle has severely decreased function. The left ventricle demonstrates  global hypokinesis. There is moderate left  ventricular hypertrophy of the  posterior segment.   2. Right ventricular systolic function is normal. The right ventricular  size is mildly enlarged.   3. Left atrial size was moderately dilated.   4. The pericardial effusion is posterior to the left ventricle.   5. The mitral valve is normal in structure. Mild to moderate mitral valve  regurgitation. No evidence of mitral stenosis.   6. The aortic valve is normal in structure. Aortic valve regurgitation is  not visualized. No aortic stenosis is present.   7. The inferior vena cava is dilated in size with <50% respiratory  variability, suggesting right atrial pressure  of 15 mmHg.   Transesophageal Echocardiogram: Date: 05/20/19 Results:  1. No LV thrombus. Left ventricular ejection fraction, by estimation, is  25 to 30%. The left ventricle has severely decreased function. The left  ventricle demonstrates global hypokinesis. The left ventricular internal  cavity size was mildly dilated.   2. Right ventricular systolic function is moderately reduced. The right  ventricular size is mildly enlarged.   3. No ASD or PFO, negative bubble study. Left atrial size was mildly  dilated. No left atrial/left atrial appendage thrombus was detected.   4. Right atrial size was mildly dilated.   5. Peak RV-RA gradient 25 mmHg.   6. Normal caliber aorta with mild plaque.   7. The aortic valve is tricuspid. Aortic valve regurgitation is not  visualized. No aortic stenosis is present.   8. The mitral valve is normal in structure. Trivial mitral valve  regurgitation. No evidence of mitral stenosis.    Left/Right Heart Catheterizations: Date: 05/18/2019 Results: 1. Low filling pressures. 2. Preserved cardiac output. 3. Nonobstructive mild coronary disease.   Nonischemic cardiomyopathy.  Will start Eliquis this evening.  Recent Labs: 07/31/2020: B Natriuretic Peptide 2,128.0 08/01/2020: TSH 3.816 08/04/2020: Magnesium 1.9 10/16/2020: ALT 10; BUN 23;  Creatinine, Ser 1.38; Hemoglobin 10.4; Platelets 156; Potassium 4.1; Sodium 134  Recent Lipid Panel    Component Value Date/Time   CHOL 76 05/05/2019 1006   TRIG 41 05/05/2019 1006   HDL 33 (L) 05/05/2019 1006   CHOLHDL 2.3 05/05/2019 1006   VLDL 8 05/05/2019 1006   LDLCALC 35 05/05/2019 1006    Risk Assessment/Calculations:    CHA2DS2-VASc Score = 1  This indicates a 0.6% annual risk of stroke. The patient's score is based upon: CHF History: 1 HTN History: 0 Diabetes History: 0 Stroke History: 0 Vascular Disease History: 0 Age Score: 0 Gender Score: 0         Physical Exam:    VS:  BP 100/60 (BP Location: Left Arm, Patient Position: Sitting, Cuff Size: Normal)   Pulse 91   Ht '5\' 10"'$  (1.778 m)   Wt 135 lb (61.2 kg)   BMI 19.37 kg/m     Wt Readings from Last 3 Encounters:  11/22/20 135 lb (61.2 kg)  10/16/20 130 lb (59 kg)  10/03/20 135 lb (61.2 kg)    GEN:  Well nourished, well developed in no acute distress HEENT: Normal NECK: No JVD LYMPHATICS: No lymphadenopathy CARDIAC: irregular rate and rhythm, no murmurs, rubs, gallops RESPIRATORY:  Clear to auscultation without rales, wheezing or rhonchi  ABDOMEN: Soft, non-tender, non-distended MUSCULOSKELETAL:  No edema; No deformity  SKIN: Warm and dry NEUROLOGIC:  Alert and oriented x 3 PSYCHIATRIC:  appropraite mood and affect  ASSESSMENT:    1. Chronic combined systolic and diastolic heart failure ([place])   2. Atypical atrial flutter (H[facility])     PLAN:    Persistent AFL  - CHADSVASC 1 With Alcohol Misuse with prior-withdrawal but cessation History of polysubstance abuse HFrEF Bipolar Disorder - NYHA class II, Stage III, euvolemic, etiology from AFL  - HF likely mediated from AFL - will continue DOAC - patient is off coreg but rate controlled with decrease BP - Continue Farixa 10 mg PO Daily - continue Low dose Entresto and Aldactone today; getting labs; if K is persistently elevated will stop MRA -  needs PCP; we have referred - if worsening symptoms we have discussed doing a TEE/DCCV - if worsening we will   Will plan for Three months  follow up unless new symptoms or abnormal test results warranting change in plan  Would be reasonable for  APP Follow up   Medication Adjustments/Labs and Tests Ordered: Current medicines are reviewed at length with the patient today.  Concerns regarding medicines are outlined above.  Orders Placed This Encounter  Procedures   Basic metabolic panel   Pro b natriuretic peptide (BNP)   Magnesium   EKG 12-Lead   ECHOCARDIOGRAM COMPLETE     No orders of the defined types were placed in this encounter.   Patient Instructions  Medication Instructions:  Your physician recommends that you continue on your current medications as directed. Please refer to the Current Medication list given to you today.  *If you need a refill on your cardiac medications before your next appointment, please call your pharmacy*  **[facility] go to [facility] to get your out of pocket expense report to give to Raquel Sarna.     Lab Work: TODAY: BMP, BNP, MG  If you have labs (blood work) drawn today and your tests are completely normal, you will receive your results only by: MyChart Message (if you have MyChart) OR A paper copy in the mail If you have any lab test that is abnormal or we need to change your treatment, we will call you to review the results.   Testing/Procedures: Your physician has requested that you have an echocardiogram in 3 months. Echocardiography is a painless test that uses sound waves to create images of your heart. It provides your doctor with information about the size and shape of your heart and how well your heart's chambers and valves are working. This procedure takes approximately one hour. There are no restrictions for this procedure.    Follow-Up: At [facility], you and your health needs are our priority.  As part of our continuing  mission to provide you with exceptional heart care, we have created designated Provider Care Teams.  These Care Teams include your primary Cardiologist (physician) and Advanced Practice Providers (APPs -  Physician Assistants and Nurse Practitioners) who all work together to provide you with the care you need, when you need it.  We recommend signing up for the patient portal called "MyChart".  Sign up information is provided on this After Visit Summary.  MyChart is used to connect with patients for Virtual Visits (Telemedicine).  Patients are able to view lab/test results, encounter notes, upcoming appointments, etc.  Non-urgent messages can be sent to your provider as well.   To learn more about what you can do with MyChart, go to [email or web address].    Your next appointment:   3 month(s) (make OV post Echo)  The format for your next appointment:   In Person  Provider:   You may see Werner Lean, MD or one of the following Advanced Practice Providers on your designated Care Team:   Melina Copa, PA-C Ermalinda Barrios, PA-C        Signed, Werner Lean, MD  11/22/2020 2:25 PM    [place]

## 2020-11-22 ENCOUNTER — Ambulatory Visit: Payer: Medicare (Managed Care) | Admitting: Internal Medicine

## 2020-11-22 ENCOUNTER — Encounter: Payer: Self-pay | Admitting: Internal Medicine

## 2020-11-22 ENCOUNTER — Other Ambulatory Visit: Payer: Self-pay

## 2020-11-22 ENCOUNTER — Telehealth: Payer: Self-pay | Admitting: Internal Medicine

## 2020-11-22 VITALS — BP 100/60 | HR 91 | Ht 70.0 in | Wt 135.0 lb

## 2020-11-22 DIAGNOSIS — I484 Atypical atrial flutter: Secondary | ICD-10-CM

## 2020-11-22 DIAGNOSIS — I5042 Chronic combined systolic (congestive) and diastolic (congestive) heart failure: Secondary | ICD-10-CM | POA: Diagnosis not present

## 2020-11-22 NOTE — Telephone Encounter (Signed)
Pt was in the office today, when he got home he found some discrepancies on his meds list. Please advise pt further

## 2020-11-22 NOTE — Patient Instructions (Addendum)
Medication Instructions:  Your physician recommends that you continue on your current medications as directed. Please refer to the Current Medication list given to you today.  *If you need a refill on your cardiac medications before your next appointment, please call your pharmacy*  **[facility] get your out of pocket expense report to give to Raquel Sarna.     Lab Work: TODAY: BMP, BNP, MG  If you have labs (blood work) drawn today and your tests are completely normal, you will receive your results only by: MyChart Message (if you have MyChart) OR A paper copy in the mail If you have any lab test that is abnormal or we need to change your treatment, we will call you to review the results.   Testing/Procedures: Your physician has requested that you have an echocardiogram in 3 months. Echocardiography is a painless test that uses sound waves to create images of your heart. It provides your doctor with information about the size and shape of your heart and how well your heart's chambers and valves are working. This procedure takes approximately one hour. There are no restrictions for this procedure.    Follow-Up: At [facility], you and your health needs are our priority.  As part of our continuing mission to provide you with exceptional heart care, we have created designated Provider Care Teams.  These Care Teams include your primary Cardiologist (physician) and Advanced Practice Providers (APPs -  Physician Assistants and Nurse Practitioners) who all work together to provide you with the care you need, when you need it.  We recommend signing up for the patient portal called "MyChart".  Sign up information is provided on this After Visit Summary.  MyChart is used to connect with patients for Virtual Visits (Telemedicine).  Patients are able to view lab/test results, encounter notes, upcoming appointments, etc.  Non-urgent messages can be sent to your provider as well.   To  learn more about what you can do with MyChart, go to [email or web address].    Your next appointment:   3 month(s) (make OV post Echo)  The format for your next appointment:   In Person  Provider:   You may see Werner Lean, MD or one of the following Advanced Practice Providers on your designated Care Team:   Melina Copa, PA-C Ermalinda Barrios, PA-C

## 2020-11-23 LAB — SPECIMEN STATUS

## 2020-11-23 NOTE — Telephone Encounter (Signed)
Left a message for pt to call back

## 2020-11-24 NOTE — Telephone Encounter (Signed)
Spoke with pt who reports he is currently taking Carvedilol 25 m BID, Entresto 24-26 mg BID, Farxiga and Xarelto 20 a day.  He is not taking spironolactone. Medication list updated.  Will forward to MD for his knowledge.

## 2020-11-24 NOTE — Telephone Encounter (Signed)
   Pt is returning call, he said he is calling about Carvedilol and spironolactone

## 2020-11-27 LAB — MAGNESIUM: Magnesium: 2 mg/dL (ref 1.6–2.3)

## 2020-11-27 LAB — BASIC METABOLIC PANEL
BUN/Creatinine Ratio: 16 (ref 10–24)
BUN: 23 mg/dL (ref 8–27)
CO2: 25 mmol/L (ref 20–29)
Calcium: 9.5 mg/dL (ref 8.6–10.2)
Chloride: 103 mmol/L (ref 96–106)
Creatinine, Ser: 1.42 mg/dL — ABNORMAL HIGH (ref 0.76–1.27)
Glucose: 79 mg/dL (ref 65–99)
Potassium: 4.9 mmol/L (ref 3.5–5.2)
Sodium: 144 mmol/L (ref 134–144)
eGFR: 56 mL/min/{1.73_m2} — ABNORMAL LOW (ref >59)

## 2020-11-27 LAB — SPECIMEN STATUS REPORT

## 2020-11-27 LAB — PRO B NATRIURETIC PEPTIDE: NT-Pro BNP: 1235 pg/mL — ABNORMAL HIGH (ref 0–210)

## 2020-11-27 NOTE — Telephone Encounter (Signed)
Medication Management (Newest Message First) Chandrasekhar, Terisa Starr, MD  You; Precious Gilding, RN 2 days ago   [facility] Noted- this appears to be the best regimen for him.

## 2020-11-29 ENCOUNTER — Telehealth: Payer: Self-pay | Admitting: Licensed Clinical Social Worker

## 2020-11-29 NOTE — Telephone Encounter (Signed)
Patient dropped off pharmacy expense report and CSW contacted patient to follow up on added questions. CSW unable to leave message as mailbox is full. Will attempt contact again tomorrow. Raquel Sarna, [place], [place]

## 2020-12-04 ENCOUNTER — Telehealth: Payer: Self-pay | Admitting: Internal Medicine

## 2020-12-04 NOTE — Telephone Encounter (Signed)
Patient calling the office for samples of medication: ° ° °1.  What medication and dosage are you requesting samples for? rivaroxaban (XARELTO) 20 MG TABS tablet ° °2.  Are you currently out of this medication? yes ° ° °

## 2020-12-05 NOTE — Telephone Encounter (Signed)
Prescription samples request for Xarelto received.  Indication: AF Last office visit: 9/22 Weight: 612 kg Age:60 Scr: 1.42 CrCl:47  (Note CrCl is < 50 with most recent lab, however prior to this was > 50.  Will continue on 20 mg dose for now but watch closely over next 3-6 months)  Please offer any samples of Xarelto 20 mg and also have patient work with social worker Raquel Sarna) to see if he can qualify for free medication from manufacturer.  (If he doesn't qualify, have MD consider Eliquis, they have a more generous patient assistance program)

## 2020-12-05 NOTE — Telephone Encounter (Addendum)
Per notes Kennyth Lose had assisted pt with meeting his out of pocket to bring medications down to $47/mo for Xarelto. Pt had previously been denied for PAP through [employer]. He had been advised to reapply with expense report from pharmacy and income per her notes. Unclear if pt had reapplied; attempted to call [employer] and gain clarity but unfortunately their system is down and they cannot tell me if there is a new application submitted.   Wanted to make note for future reference as pt continues to receive samples but does have coverage for medication and has been assisted by care navigation team to attempt to reduce costs. I will also pass this by Jeani Hawking Via as she is familiar with pt.   Westley Hummer, MSW, [place]  [phone]

## 2020-12-05 NOTE — Telephone Encounter (Signed)
Called pt and left voice mail, informing him that I was leaving him 2 bottles of Xarelto 20 mg tablet and information for patient assistance, for pt to contact manufacturer for application. I advised pt that if he has any other problems, questions or concerns, to give our office a call back.   LOT# [id number]  EXP: [id number]

## 2020-12-06 NOTE — Telephone Encounter (Signed)
**Note De-Identified  Obfuscation** I called the pt to discuss but got no answer and could not leave a VM as his mailbox is full.  If the pt is not eligible for approval for asst with Xarelto through the [employer] pt asst Foundation he can contact [employer] [phone]) to enroll in their Xarelto program and pay $85/30 day supply or $240/90 day supply.  We cannot continue to give samples going forward as our samples are meant for pts who are just starting Xarelto. He may need to be switched to Warfarin as it is the only generic anticoagulant on the market and is much less expensive but does require close monitoring.

## 2020-12-21 ENCOUNTER — Telehealth: Payer: Self-pay | Admitting: Internal Medicine

## 2020-12-21 MED ORDER — CARVEDILOL 25 MG PO TABS: 25.0000 mg | ORAL_TABLET | Freq: Two times a day (BID) | ORAL | 3 refills | Status: AC

## 2020-12-21 MED ORDER — RIVAROXABAN 15 MG PO TABS: 15.0000 mg | ORAL_TABLET | Freq: Every day | ORAL | 3 refills | Status: DC

## 2020-12-21 NOTE — Telephone Encounter (Signed)
Caffie Damme, RN, I don't see why this was sent to the refill pool. Please advise

## 2020-12-21 NOTE — Telephone Encounter (Signed)
2 weeks worth of Xarelto 15 mg PO QD left at front desk.

## 2020-12-21 NOTE — Telephone Encounter (Signed)
If patient was denied patient assistance and cannot afford $47/month then he would need to switch to warfarin. However, this does not seem ideal with his social/phycological co morbidities.  Has he applied for low income subsidy? If approved him medications would be significantly lower He can apply through the social security website, by phone or in person at the [facility] office. Could give 2 more weeks of samples but we cannot keep sampling him. However, his dose needs to be reduced to 15mg  daily.  His CHADSVASC is 1

## 2020-12-21 NOTE — Telephone Encounter (Signed)
I called pt back and reviewed pharmacist recommendations. I searched the number for social security low income subsidy and gave the number to pt.  He reports no one ever answers the phone for number provided [phone]).  I advised pt that we could not continue to give him samples.  He reports that he will follow up and come by to pick up samples today.

## 2020-12-21 NOTE — Telephone Encounter (Signed)
Called pt back in regards to Xarelto.  He reports that he doesn't have any pills left and last took med on Tues 12/19/20.  He says he applied for assistance and was told he would have to spend $2000 out of pocket before he could get assistance.  I advised him to call in earlier for med request so that he doesn't miss any doses.  Will route to pharm d to address.

## 2020-12-21 NOTE — Telephone Encounter (Signed)
Patient calling the office for samples of medication: ° ° °1.  What medication and dosage are you requesting samples for? rivaroxaban (XARELTO) 20 MG TABS tablet ° °2.  Are you currently out of this medication? yes ° ° °

## 2020-12-21 NOTE — Telephone Encounter (Signed)
*  STAT* If patient is at the pharmacy, call can be transferred to refill team.   1. Which medications need to be refilled? (please list name of each medication and dose if known) carvedilol (COREG) 25 MG tablet  2. Which pharmacy/location (including street and city if local pharmacy) is medication to be sent to? [facility] [phone] - [place], [place] - [address] AT [place]  3. Do they need a 30 day or 90 day supply? 90 ds

## 2020-12-21 NOTE — Telephone Encounter (Signed)
Pt's medication was sent to pt's pharmacy as requested. Confirmation received.  °

## 2020-12-21 NOTE — Telephone Encounter (Signed)
Called pt to review pharmacy recommendations.  Message left for pt to call the office today.  Dose for Xarelto changed to 15 mg PO QD.

## 2020-12-22 ENCOUNTER — Ambulatory Visit: Payer: Medicare (Managed Care) | Admitting: Internal Medicine

## 2020-12-22 ENCOUNTER — Telehealth: Payer: Self-pay

## 2020-12-22 NOTE — Telephone Encounter (Signed)
I reached out for assistance for this pt d/t cost of Xarelto and his inability to pay.  Pharmacy and PA can't offer any thing further.  Per pharmacy note pt was given an application to apply for Eliquis assistance previously.  I called pt and he says he never applied.  I have printed out an Eliquis pt assistance application and left a front desk for pt to fill out.  I asked that he bring in proof of income he reports that SW has this information.  I will route message to SW to see if she can provide this information.

## 2020-12-25 ENCOUNTER — Telehealth: Payer: Self-pay | Admitting: Licensed Clinical Social Worker

## 2020-12-25 NOTE — Telephone Encounter (Signed)
CSW contacted patient to follow up medication assistance. Patient states that the staff RN called and is switching him to Eliquis and he needs to go to the office to complete the patient assistance application. CSW will meet patient at the office and assist with completion as well as bring his income verification form. CSW will meet patient tomorrow at the [facility] office. Raquel Sarna, [place], [place]

## 2020-12-27 ENCOUNTER — Telehealth: Payer: Self-pay | Admitting: Licensed Clinical Social Worker

## 2020-12-27 ENCOUNTER — Telehealth: Payer: Self-pay

## 2020-12-27 MED ORDER — APIXABAN 5 MG PO TABS: 5.0000 mg | ORAL_TABLET | Freq: Two times a day (BID) | ORAL | 3 refills | Status: AC

## 2020-12-27 NOTE — Telephone Encounter (Signed)
CSW spoke with patient to follow up on patient assistance application for eliquis. CSW informed patient that income verification statement was included with his completed application yesterday afternoon. CSW will mail back original statement to patient along with some extra copies. Patient grateful for the assistance and will reach out to CSW if needed. Raquel Sarna, [place], [place]

## 2020-12-27 NOTE — Telephone Encounter (Signed)
**Note De-Identified  Obfuscation** A BMSPAF application for Eliquis was left at the office.  Xarelto is currently on the pts med list so ok from Dr Gasper Sells is needed prior to this application for Eliquis 5 mg BID being faxed to [employer]MS[employer].  I completed the providers page of the pts BMSPAF and have faxed all to Dr Clovis Fredrickson nurse so she can obtain his signature, date it, and to fax all to [facility] at the fax number written on the cover letter included or to place in the to be faxedbasket in Medical Records to be faxed.

## 2020-12-27 NOTE — Telephone Encounter (Signed)
**Note De-Identified  Obfuscation** Ok per Dr Gasper Sells for the pt to switch from Xarelto 15 mg qd to Eliquis 5 mg BID. I have updated this in the pts med list.

## 2021-01-02 NOTE — Telephone Encounter (Signed)
**Note De-Identified  Obfuscation** Per BMSPAF the pt did not write the amount of people who live in his household and they did not get page 4, the providers page.  I have written the household amount as 1 per the pt and since Dr Gasper Sells is out of the office this week, I completed another page 4 of a BMSPAF application with Dr Radford Pax as DODs info written in. I have emailed the pts entire application to Dr Oralia Rud nurse so she can obtain Dr Theodosia Blender signature, date it, and to fax all to [facility] at the fax number written on the cover letter included or to place in the to be faxed basket in Medical Records to be faxed.

## 2021-01-02 NOTE — Telephone Encounter (Signed)
Patient assistance application signed by DOD Dr. Radford Pax, faxed and placed in Prior authorization box.

## 2021-01-15 ENCOUNTER — Ambulatory Visit: Payer: Medicare (Managed Care) | Admitting: Internal Medicine

## 2021-02-13 ENCOUNTER — Other Ambulatory Visit (HOSPITAL_COMMUNITY): Payer: Medicare (Managed Care)

## 2021-02-13 ENCOUNTER — Encounter (HOSPITAL_COMMUNITY): Payer: Self-pay | Admitting: Internal Medicine

## 2021-02-18 NOTE — Progress Notes (Deleted)
Cardiology Office Note:    Date:  02/18/2021   ID:  Austin Cobb, DOB 1961-03-06, MRN [id number]  PCP:  Patient, No Pcp Per (Inactive)   [facility] Providers Cardiologist:  Werner Lean, MD     Referring MD: No ref. provider found   CC:  AHF f/u  History of Present Illness:    Austin Cobb is a 60 y.o. male with a hx of AFL, HFrEF, Hx of polysubstance abuse (crack-cocaine, marijuana), bipolar current alcohol use who presents for evaluation 10/03/20.  In follow up, we were unclear on his medications.  Had hyperkalemia with [name]- believed it was his blood thinners.  We were unable to reach him and he had an EMS safety visit.  He has submitted his medications as a safety visit for Entresto.  Seen 11/22/20.  Was returned to his medications and was less manic at last visit.  We have filled out paperwork for patient for [facility].  Patient notes that he is doing ***.   Since day prior/last visit notes *** . There are no*** interval hospital/ED visit.    No chest pain or pressure ***.  No SOB/DOE*** and no PND/Orthopnea***.  No weight gain or leg swelling***.  No palpitations or syncope ***.  Ambulatory blood pressure ***.    Past Medical History:  Diagnosis Date   Anxiety    Arthritis    Bipolar 1 disorder ([place])    Depression    Bipolar, not on meds/pt. stopped meds on own over 12 yrs ago   GERD (gastroesophageal reflux disease)    History of colon polyps    Manic disorder ([place])    Noncompliance 05/14/2019   Substance abuse ([place])    21yrs ago smoke crack and marjuana   Tobacco abuse 05/13/2012    Past Surgical History:  Procedure Laterality Date   CARDIOVERSION N/A 05/06/2019   Procedure: CARDIOVERSION;  Surgeon: Nahser, Wonda Cheng, MD;  Location: [place];  Service: Cardiovascular;  Laterality: N/A;   CARDIOVERSION N/A 05/20/2019   Procedure: CARDIOVERSION;  Surgeon: Larey Dresser, MD;  Location: [place];  Service: Cardiovascular;  Laterality:  N/A;   EXAMINATION UNDER ANESTHESIA N/A 11/12/2012   Procedure: Jasmine December UNDER ANESTHESIA;  Surgeon: Adin Hector, MD;  Location: [facility];  Service: General;  Laterality: N/A;   EYE SURGERY     bilateral eye sx as a child [age]yrs old.   RIGHT/LEFT HEART CATH AND CORONARY ANGIOGRAPHY N/A 05/18/2019   Procedure: RIGHT/LEFT HEART CATH AND CORONARY ANGIOGRAPHY;  Surgeon: Larey Dresser, MD;  Location: [place] [facility];  Service: Cardiovascular;  Laterality: N/A;   SPHINCTEROTOMY N/A 11/12/2012   Procedure: SPHINCTEROTOMY;  Surgeon: Adin Hector, MD;  Location: [facility];  Service: General;  Laterality: N/A;   TEE WITHOUT CARDIOVERSION N/A 05/06/2019   Procedure: TRANSESOPHAGEAL ECHOCARDIOGRAM (TEE);  Surgeon: Acie Fredrickson Wonda Cheng, MD;  Location: [place];  Service: Cardiovascular;  Laterality: N/A;   TEE WITHOUT CARDIOVERSION N/A 05/20/2019   Procedure: TRANSESOPHAGEAL ECHOCARDIOGRAM (TEE);  Surgeon: Larey Dresser, MD;  Location: [facility];  Service: Cardiovascular;  Laterality: N/A;   TONSILLECTOMY      Current Medications: No outpatient medications have been marked as taking for the 02/19/21 encounter (Appointment) with Werner Lean, MD.     Allergies:   Adhesive [tape]   Social History   Socioeconomic History   Marital status: Divorced    Spouse name: Not on file   Number of children: Not on file  Years of education: Not on file   Highest education level: Not on file  Occupational History   Not on file  Tobacco Use   Smoking status: Every Day    Packs/day: 0.50    Years: 35.00    Pack years: 17.50    Types: Cigarettes   Smokeless tobacco: Never  Substance and Sexual Activity   Alcohol use: Yes    Alcohol/week: 4.0 standard drinks    Types: 4 Cans of beer per week    Comment: 4 cans of beer a day    Drug use: Yes    Types: Cocaine    Comment: sometimes   Sexual activity: Never  Other Topics Concern   Not on file  Social History Narrative   Not on file    Social Determinants of Health   Financial Resource Strain: Low Risk    Difficulty of Paying Living Expenses: Not very hard  Food Insecurity: No Food Insecurity   Worried About Charity fundraiser in the Last Year: Never true   Ran Out of Food in the Last Year: Never true  Transportation Needs: Unmet Transportation Needs   Lack of Transportation (Medical): No   Lack of Transportation (Non-Medical): Yes  Physical Activity: Not on file  Stress: Not on file  Social Connections: Not on file    Social:  Has a care provider in her house [name])  Family History: History of coronary artery disease notable for no members. History of heart failure notable for no members. History of arrhythmia notable for no members.  ROS:   Please see the history of present illness.     All other systems reviewed and are negative.  EKGs/Labs/Other Studies Reviewed:    The following studies were reviewed today:  EKG:  10/03/20: AFL rate 80  Transthoracic Echocardiogram: Date: 08/01/20 Results: 1. Left ventricular ejection fraction, by estimation, is <20%. The left  ventricle has severely decreased function. The left ventricle demonstrates  global hypokinesis. There is moderate left ventricular hypertrophy of the  posterior segment.   2. Right ventricular systolic function is normal. The right ventricular  size is mildly enlarged.   3. Left atrial size was moderately dilated.   4. The pericardial effusion is posterior to the left ventricle.   5. The mitral valve is normal in structure. Mild to moderate mitral valve  regurgitation. No evidence of mitral stenosis.   6. The aortic valve is normal in structure. Aortic valve regurgitation is  not visualized. No aortic stenosis is present.   7. The inferior vena cava is dilated in size with <50% respiratory  variability, suggesting right atrial pressure of 15 mmHg.   Transesophageal Echocardiogram: Date: 05/20/19 Results:  1. No LV thrombus. Left  ventricular ejection fraction, by estimation, is  25 to 30%. The left ventricle has severely decreased function. The left  ventricle demonstrates global hypokinesis. The left ventricular internal  cavity size was mildly dilated.   2. Right ventricular systolic function is moderately reduced. The right  ventricular size is mildly enlarged.   3. No ASD or PFO, negative bubble study. Left atrial size was mildly  dilated. No left atrial/left atrial appendage thrombus was detected.   4. Right atrial size was mildly dilated.   5. Peak RV-RA gradient 25 mmHg.   6. Normal caliber aorta with mild plaque.   7. The aortic valve is tricuspid. Aortic valve regurgitation is not  visualized. No aortic stenosis is present.   8. The mitral valve is normal  in structure. Trivial mitral valve  regurgitation. No evidence of mitral stenosis.    Left/Right Heart Catheterizations: Date: 05/18/2019 Results: 1. Low filling pressures. 2. Preserved cardiac output. 3. Nonobstructive mild coronary disease.   Nonischemic cardiomyopathy.  Will start Eliquis this evening.  Recent Labs: 07/31/2020: B Natriuretic Peptide 2,128.0 08/01/2020: TSH 3.816 10/16/2020: ALT 10 11/22/2020: BUN 23; Creatinine, Ser 1.42; Hemoglobin WILL FOLLOW; Magnesium 2.0; NT-Pro BNP 1,235; Platelets WILL FOLLOW; Potassium 4.9; Sodium 144  Recent Lipid Panel    Component Value Date/Time   CHOL 76 05/05/2019 1006   TRIG 41 05/05/2019 1006   HDL 33 (L) 05/05/2019 1006   CHOLHDL 2.3 05/05/2019 1006   VLDL 8 05/05/2019 1006   LDLCALC 35 05/05/2019 1006    Risk Assessment/Calculations:    CHA2DS2-VASc Score = 1  This indicates a 0.6% annual risk of stroke. The patient's score is based upon: CHF History: 1 HTN History: 0 Diabetes History: 0 Stroke History: 0 Vascular Disease History: 0 Age Score: 0 Gender Score: 0         Physical Exam:    VS:  There were no vitals taken for this visit.    Wt Readings from Last 3 Encounters:   11/22/20 135 lb (61.2 kg)  10/16/20 130 lb (59 kg)  10/03/20 135 lb (61.2 kg)    GEN:  Well nourished, well developed in no acute distress HEENT: Normal NECK: No JVD LYMPHATICS: No lymphadenopathy CARDIAC: irregular rate and rhythm, no murmurs, rubs, gallops RESPIRATORY:  Clear to auscultation without rales, wheezing or rhonchi  ABDOMEN: Soft, non-tender, non-distended MUSCULOSKELETAL:  No edema; No deformity  SKIN: Warm and dry NEUROLOGIC:  Alert and oriented x 3 PSYCHIATRIC:  appropraite mood and affect  ASSESSMENT:    No diagnosis found.   PLAN:    Persistent AFL  - CHADSVASC 1 With Alcohol Misuse with prior-withdrawal but cessation History of polysubstance abuse HFrEF Bipolar Disorder - NYHA class II, Stage III, euvolemic, etiology from AFL  - HF likely mediated from AFL - will continue DOAC - patient is off coreg but rate controlled with decrease BP - Continue Farixa 10 mg PO Daily - continue Low dose Entresto and Aldactone today; getting labs; if K is persistently elevated will stop MRA; Likely stop MRA if questions of medication *** - needs PCP; we have referred - if worsening symptoms we have discussed doing a TEE/DCCV   Will plan for Three months follow up unless new symptoms or abnormal test results warranting change in plan  Would be reasonable for  APP Follow up   Medication Adjustments/Labs and Tests Ordered: Current medicines are reviewed at length with the patient today.  Concerns regarding medicines are outlined above.  No orders of the defined types were placed in this encounter.    No orders of the defined types were placed in this encounter.   There are no Patient Instructions on file for this visit.   Signed, Werner Lean, MD  02/18/2021 4:26 PM    [place]

## 2021-02-19 ENCOUNTER — Ambulatory Visit: Payer: Medicare (Managed Care) | Admitting: Internal Medicine

## 2021-03-28 ENCOUNTER — Telehealth: Payer: Self-pay | Admitting: Internal Medicine

## 2021-03-28 NOTE — Telephone Encounter (Signed)
Patient needs assistance to fill out forms for Entresto with [employer]. He states he has left several messages for the social worker but hasn't received a call back.

## 2021-03-29 NOTE — Telephone Encounter (Signed)
**Note De-Identified  Obfuscation** No answer so I left a message on the pts VM asking him to call Jeani Hawking back at Dr Oralia Rud office at [facility] at [phone].

## 2021-03-30 NOTE — Telephone Encounter (Signed)
**Note De-identified  Obfuscation** LMTCB

## 2021-04-02 NOTE — Telephone Encounter (Signed)
Patient was returning call for [facility]

## 2021-04-04 NOTE — Telephone Encounter (Signed)
**Note De-Identified  Obfuscation** He was very loud and was speaking over me I asked him several times to stop speaking over me but he continued. He is stating that "the front office lied to him and told him he would not have to bring his [employer] Pt Asst application for Entresto back to the office" and that he has been calling Raquel Sarna, CPhT for weeks and she has not returned his call. He also complains that I did not return his call but I did and he did not answer so I left him a VM but he states he didn't get it.  I advised him that he does not have to come back to the office as he can mail his part of is application with his documents to [employer] and I can complete a providers page of a application, have Dr Gasper Sells sign and date it, and we will fax to [employer]  I advised him that I can help him but he wants everything handled the way Kennyth Lose did where he states 'I didn't have to do anything cause she took care of it.

## 2021-04-05 ENCOUNTER — Telehealth: Payer: Self-pay | Admitting: Licensed Clinical Social Worker

## 2021-04-05 NOTE — Telephone Encounter (Signed)
CSW contacted patient at the request of patient who needed assistance with PAP for Entresto. Patient apparently did not understand the process and was confused about how to complete and obtain the medical part of the application. CSW explained the process and encouraged to work with Mardene Celeste Via who is the contact person at [facility] for assistance with PAP. Patient verbalizes understanding and will complete the application. CSW available as needed. Raquel Sarna, [place], [place]

## 2021-04-27 ENCOUNTER — Telehealth: Payer: Self-pay | Admitting: Internal Medicine

## 2021-04-27 NOTE — Telephone Encounter (Signed)
Patient is reaching out wanting contact information for direction on completing an assistance application.. please advise

## 2021-04-30 NOTE — Telephone Encounter (Signed)
**Note De-Identified  Obfuscation** No answer so I left a message on the pts VM asking him to call Jeani Hawking back at Dr Oralia Rud office at [facility] at [phone].

## 2021-05-01 ENCOUNTER — Telehealth: Payer: Self-pay | Admitting: Internal Medicine

## 2021-05-01 NOTE — Telephone Encounter (Signed)
Patient is needing to have a ride scheduled to come in and have patient assistance forms filled out... pt doesn't have an appt for this and therefore cannot be scheduled by [place]... please advise

## 2021-05-01 NOTE — Telephone Encounter (Signed)
**Note De-Identified  Obfuscation** I called the pt to assist him in applying for pt asst for his [place] through [employer], Entresto through [employer], and Eliquis through [employer]. Per his request, I completed the applications with the information that he provided me with.  The pt states he is coming to the office today to sign his 3 applications, bring his 2022 proof of income, his insurance cards, and states that he cannot provide an out of pocket RX expense report because he has not paid for any medications this year.  I have emailed the pages of his applications that require his signature to my clinical supervisor, Janan Halter so she can obtain the pts signature when he gets to the office today.   The pt states that he has plenty of Fargixa and Entresto on hand but has not taken Xarelto, Eliquis, or any other anticoagulant for months now because he cannot afford it.  I educated the pt on the importance of him taking his anticoagulant medication as directed (as with all of his medications) and of the risks/dangers involved if he does not take as advised.  He states that he does not drive and uses [facility] to get to his appointments so Warfarin would be difficult for him to take due required to INR checks with our [facility].  I will discuss with pharmacy as the pt needs to be taking an anticoagulant.

## 2021-05-02 ENCOUNTER — Telehealth (HOSPITAL_COMMUNITY): Payer: Self-pay | Admitting: Licensed Clinical Social Worker

## 2021-05-02 NOTE — Telephone Encounter (Signed)
CSW received message from staff requesting call to patient to assist with transportation to office today to complete PAP applications. CSW has contacted patient multiple times to arrange transport with no answer. Message left for return call. Raquel Sarna, [place], [place]

## 2021-05-04 MED ORDER — SACUBITRIL-VALSARTAN 24-26 MG PO TABS: 1.0000 | ORAL_TABLET | Freq: Two times a day (BID) | ORAL | 3 refills | Status: AC

## 2021-05-04 NOTE — Telephone Encounter (Signed)
Script for entresto 24/26 mg PO BID printed will have MD sign at next office day.

## 2021-05-04 NOTE — Telephone Encounter (Signed)
**Note De-Identified  Obfuscation** The pt came to the office and signed his BMSPAF/Eliquis, [employer]/Entresto, and [place] and [place]/Farxiga applications. I have completed the providers page of all three applications and have emailed them to Dr Oralia Rud nurse so she can obtain his signature on all 3 applications, date them and to fax all to appropriate Pt Asst Foundation at the fax numbers written on the cover letters for each.  The [employer] pt asst application needs a Entresto 24-26 mg RX printed and signed for #180 with 3 refills added with it prior to faxing to [employer] pt asst foundation.

## 2021-05-09 NOTE — Telephone Encounter (Signed)
Pt assistance applications faxed by MR on 05/07/21. ?

## 2021-05-15 NOTE — Telephone Encounter (Signed)
**Note De-Identified Curties Conigliaro Obfuscation** Letter received Hilary Milks fax from [employer] pt asst foundation stating that they need the pts 2022 proof of income. ?The letter states that thy have notified the pt of this as well. ?

## 2021-05-22 ENCOUNTER — Telehealth: Payer: Self-pay | Admitting: Licensed Clinical Social Worker

## 2021-05-22 NOTE — Telephone Encounter (Signed)
CSW received call from patient stating he contacted the Patient Assistance for [employer] and was informed that his application is pending waiting on the proof of income. CSW will follow up with office staff to inquire as patient thought he had submitted to staff last week. CSW will return call to patient when info obtained. Raquel Sarna, [place], [place] ? ?

## 2021-05-23 ENCOUNTER — Telehealth: Payer: Self-pay | Admitting: Licensed Clinical Social Worker

## 2021-05-23 NOTE — Telephone Encounter (Signed)
Patient requested ride to office to provide needed documentation for patient assistance application. CSW received income verification and will forward to [employer] on patient's behalf. Raquel Sarna, [place], [place] ?  ?

## 2021-05-25 ENCOUNTER — Telehealth: Payer: Self-pay

## 2021-05-25 NOTE — Telephone Encounter (Signed)
Received documentation that pt has been approved and is eligible to receive Entresto at no cost until 03/10/2022. ? ?Patient ID: [id number] ?

## 2021-05-30 ENCOUNTER — Telehealth: Payer: Self-pay | Admitting: Licensed Clinical Social Worker

## 2021-05-30 NOTE — Telephone Encounter (Signed)
Patient contacted CSW to inform that he has been approved for Entresto PAP. CSW available as needed. Raquel Sarna, [place], [place] ? ?

## 2021-06-11 ENCOUNTER — Telehealth: Payer: Self-pay | Admitting: Internal Medicine

## 2021-06-11 NOTE — Telephone Encounter (Signed)
?  Patient states that he was given the wrong paperwork to fill out for him to get assistance with Xarelto to the wrong manufacturer. He would like to see if he can get the correct application. Please advise.  ?

## 2021-06-12 NOTE — Telephone Encounter (Signed)
**Note De-Identified  Obfuscation** Me ?12/27/2020 ?2:44 PM ?Note ?A BMSPAF application for Eliquis was left at the office. ?  ?Xarelto is currently on the pts med list so ok from Dr Gasper Sells is needed prior to this application for Eliquis 5 mg BID being faxed to [facility]. ?  ?I completed the providers page of the pts BMSPAF and have faxed all to Dr Clovis Fredrickson nurse so she can obtain his signature, date it, and to fax all to BMSPAF at the fax number written on the cover letter included or to place in the to be faxedbasket in Medical Records to be faxed.  ?  ? ?No answer so I left a message asking the pt to call Jeani Hawking at [phone] to discuss the above switch from Xarelto to Eliquis. ?Eliquis is currently on his med list and he left [place] the BMSPAF application for Eliquis.  ?

## 2021-06-12 NOTE — Telephone Encounter (Signed)
Patient returned your call.

## 2021-06-12 NOTE — Telephone Encounter (Signed)
**Note De-identified  Obfuscation** LMTCB

## 2021-07-21 ENCOUNTER — Encounter (HOSPITAL_COMMUNITY): Payer: Self-pay

## 2021-07-21 ENCOUNTER — Emergency Department (HOSPITAL_COMMUNITY): Payer: Medicare (Managed Care)

## 2021-07-21 ENCOUNTER — Other Ambulatory Visit: Payer: Self-pay

## 2021-07-21 ENCOUNTER — Inpatient Hospital Stay (HOSPITAL_COMMUNITY)
Admission: EM | Admit: 2021-07-21 | Discharge: 2021-08-09 | DRG: 682 | Disposition: E | Payer: Medicare (Managed Care) | Attending: Internal Medicine | Admitting: Internal Medicine

## 2021-07-21 ENCOUNTER — Observation Stay (HOSPITAL_COMMUNITY): Payer: Medicare (Managed Care)

## 2021-07-21 DIAGNOSIS — K30 Functional dyspepsia: Secondary | ICD-10-CM | POA: Diagnosis present

## 2021-07-21 DIAGNOSIS — E861 Hypovolemia: Secondary | ICD-10-CM | POA: Diagnosis present

## 2021-07-21 DIAGNOSIS — F1410 Cocaine abuse, uncomplicated: Secondary | ICD-10-CM | POA: Diagnosis present

## 2021-07-21 DIAGNOSIS — E870 Hyperosmolality and hypernatremia: Secondary | ICD-10-CM | POA: Diagnosis not present

## 2021-07-21 DIAGNOSIS — K219 Gastro-esophageal reflux disease without esophagitis: Secondary | ICD-10-CM | POA: Diagnosis present

## 2021-07-21 DIAGNOSIS — Z91199 Patient's noncompliance with other medical treatment and regimen due to unspecified reason: Secondary | ICD-10-CM

## 2021-07-21 DIAGNOSIS — Z91148 Patient's other noncompliance with medication regimen for other reason: Secondary | ICD-10-CM

## 2021-07-21 DIAGNOSIS — F10939 Alcohol use, unspecified with withdrawal, unspecified: Secondary | ICD-10-CM | POA: Diagnosis present

## 2021-07-21 DIAGNOSIS — E1122 Type 2 diabetes mellitus with diabetic chronic kidney disease: Secondary | ICD-10-CM | POA: Diagnosis present

## 2021-07-21 DIAGNOSIS — J40 Bronchitis, not specified as acute or chronic: Secondary | ICD-10-CM | POA: Diagnosis present

## 2021-07-21 DIAGNOSIS — E43 Unspecified severe protein-calorie malnutrition: Secondary | ICD-10-CM | POA: Diagnosis present

## 2021-07-21 DIAGNOSIS — Z515 Encounter for palliative care: Secondary | ICD-10-CM

## 2021-07-21 DIAGNOSIS — F17210 Nicotine dependence, cigarettes, uncomplicated: Secondary | ICD-10-CM | POA: Diagnosis present

## 2021-07-21 DIAGNOSIS — R1319 Other dysphagia: Secondary | ICD-10-CM | POA: Diagnosis present

## 2021-07-21 DIAGNOSIS — Z66 Do not resuscitate: Secondary | ICD-10-CM | POA: Diagnosis not present

## 2021-07-21 DIAGNOSIS — G9341 Metabolic encephalopathy: Secondary | ICD-10-CM | POA: Diagnosis present

## 2021-07-21 DIAGNOSIS — Z91048 Other nonmedicinal substance allergy status: Secondary | ICD-10-CM

## 2021-07-21 DIAGNOSIS — J9600 Acute respiratory failure, unspecified whether with hypoxia or hypercapnia: Secondary | ICD-10-CM

## 2021-07-21 DIAGNOSIS — R0789 Other chest pain: Principal | ICD-10-CM

## 2021-07-21 DIAGNOSIS — F1027 Alcohol dependence with alcohol-induced persisting dementia: Secondary | ICD-10-CM | POA: Diagnosis present

## 2021-07-21 DIAGNOSIS — I4891 Unspecified atrial fibrillation: Secondary | ICD-10-CM | POA: Diagnosis present

## 2021-07-21 DIAGNOSIS — N390 Urinary tract infection, site not specified: Secondary | ICD-10-CM | POA: Diagnosis present

## 2021-07-21 DIAGNOSIS — J9601 Acute respiratory failure with hypoxia: Secondary | ICD-10-CM | POA: Diagnosis not present

## 2021-07-21 DIAGNOSIS — N179 Acute kidney failure, unspecified: Secondary | ICD-10-CM | POA: Diagnosis present

## 2021-07-21 DIAGNOSIS — Z7901 Long term (current) use of anticoagulants: Secondary | ICD-10-CM

## 2021-07-21 DIAGNOSIS — R630 Anorexia: Secondary | ICD-10-CM | POA: Diagnosis present

## 2021-07-21 DIAGNOSIS — R627 Adult failure to thrive: Secondary | ICD-10-CM | POA: Diagnosis present

## 2021-07-21 DIAGNOSIS — Z6822 Body mass index (BMI) 22.0-22.9, adult: Secondary | ICD-10-CM

## 2021-07-21 DIAGNOSIS — N170 Acute kidney failure with tubular necrosis: Principal | ICD-10-CM | POA: Diagnosis present

## 2021-07-21 DIAGNOSIS — F10231 Alcohol dependence with withdrawal delirium: Secondary | ICD-10-CM | POA: Diagnosis not present

## 2021-07-21 DIAGNOSIS — I420 Dilated cardiomyopathy: Secondary | ICD-10-CM | POA: Diagnosis present

## 2021-07-21 DIAGNOSIS — I5084 End stage heart failure: Secondary | ICD-10-CM | POA: Diagnosis present

## 2021-07-21 DIAGNOSIS — I5043 Acute on chronic combined systolic (congestive) and diastolic (congestive) heart failure: Secondary | ICD-10-CM | POA: Diagnosis not present

## 2021-07-21 DIAGNOSIS — R570 Cardiogenic shock: Secondary | ICD-10-CM | POA: Diagnosis not present

## 2021-07-21 DIAGNOSIS — K920 Hematemesis: Secondary | ICD-10-CM | POA: Diagnosis present

## 2021-07-21 DIAGNOSIS — E871 Hypo-osmolality and hyponatremia: Secondary | ICD-10-CM | POA: Diagnosis present

## 2021-07-21 DIAGNOSIS — R64 Cachexia: Secondary | ICD-10-CM | POA: Diagnosis present

## 2021-07-21 DIAGNOSIS — K7460 Unspecified cirrhosis of liver: Secondary | ICD-10-CM | POA: Diagnosis present

## 2021-07-21 DIAGNOSIS — E876 Hypokalemia: Secondary | ICD-10-CM | POA: Diagnosis present

## 2021-07-21 DIAGNOSIS — J15212 Pneumonia due to Methicillin resistant Staphylococcus aureus: Secondary | ICD-10-CM | POA: Diagnosis present

## 2021-07-21 DIAGNOSIS — D696 Thrombocytopenia, unspecified: Secondary | ICD-10-CM | POA: Diagnosis present

## 2021-07-21 DIAGNOSIS — I959 Hypotension, unspecified: Secondary | ICD-10-CM | POA: Diagnosis present

## 2021-07-21 DIAGNOSIS — F319 Bipolar disorder, unspecified: Secondary | ICD-10-CM | POA: Diagnosis present

## 2021-07-21 DIAGNOSIS — E860 Dehydration: Secondary | ICD-10-CM | POA: Diagnosis present

## 2021-07-21 DIAGNOSIS — I4892 Unspecified atrial flutter: Secondary | ICD-10-CM | POA: Diagnosis present

## 2021-07-21 DIAGNOSIS — E872 Acidosis, unspecified: Secondary | ICD-10-CM | POA: Diagnosis present

## 2021-07-21 DIAGNOSIS — I130 Hypertensive heart and chronic kidney disease with heart failure and stage 1 through stage 4 chronic kidney disease, or unspecified chronic kidney disease: Secondary | ICD-10-CM | POA: Diagnosis present

## 2021-07-21 DIAGNOSIS — K2970 Gastritis, unspecified, without bleeding: Secondary | ICD-10-CM | POA: Diagnosis present

## 2021-07-21 DIAGNOSIS — D649 Anemia, unspecified: Secondary | ICD-10-CM | POA: Diagnosis present

## 2021-07-21 DIAGNOSIS — N1832 Chronic kidney disease, stage 3b: Secondary | ICD-10-CM | POA: Diagnosis present

## 2021-07-21 DIAGNOSIS — J90 Pleural effusion, not elsewhere classified: Secondary | ICD-10-CM | POA: Diagnosis present

## 2021-07-21 DIAGNOSIS — R410 Disorientation, unspecified: Secondary | ICD-10-CM

## 2021-07-21 DIAGNOSIS — Z79899 Other long term (current) drug therapy: Secondary | ICD-10-CM

## 2021-07-21 LAB — HEPATIC FUNCTION PANEL
ALT: 18 U/L (ref 0–44)
AST: 24 U/L (ref 15–41)
Albumin: 2.9 g/dL — ABNORMAL LOW (ref 3.5–5.0)
Alkaline Phosphatase: 50 U/L (ref 38–126)
Bilirubin, Direct: 0.1 mg/dL (ref 0.0–0.2)
Indirect Bilirubin: 0.1 mg/dL — ABNORMAL LOW (ref 0.3–0.9)
Total Bilirubin: 0.2 mg/dL — ABNORMAL LOW (ref 0.3–1.2)
Total Protein: 5.9 g/dL — ABNORMAL LOW (ref 6.5–8.1)

## 2021-07-21 LAB — ETHANOL: Alcohol, Ethyl (B): <10 mg/dL (ref <10)

## 2021-07-21 LAB — COMPREHENSIVE METABOLIC PANEL
ALT: 21 U/L (ref 0–44)
AST: 29 U/L (ref 15–41)
Albumin: 3.4 g/dL — ABNORMAL LOW (ref 3.5–5.0)
Alkaline Phosphatase: 56 U/L (ref 38–126)
Anion gap: 16 — ABNORMAL HIGH (ref 5–15)
BUN: 70 mg/dL — ABNORMAL HIGH (ref 8–23)
CO2: 15 mmol/L — ABNORMAL LOW (ref 22–32)
Calcium: 7.5 mg/dL — ABNORMAL LOW (ref 8.9–10.3)
Chloride: 96 mmol/L — ABNORMAL LOW (ref 98–111)
Creatinine, Ser: 6.63 mg/dL — ABNORMAL HIGH (ref 0.61–1.24)
GFR, Estimated: 9 mL/min — ABNORMAL LOW (ref >60)
Glucose, Bld: 213 mg/dL — ABNORMAL HIGH (ref 70–99)
Potassium: 3.7 mmol/L (ref 3.5–5.1)
Sodium: 127 mmol/L — ABNORMAL LOW (ref 135–145)
Total Bilirubin: 0.8 mg/dL (ref 0.3–1.2)
Total Protein: 6.9 g/dL (ref 6.5–8.1)

## 2021-07-21 LAB — URINALYSIS, ROUTINE W REFLEX MICROSCOPIC
Bilirubin Urine: NEGATIVE
Glucose, UA: >=500 mg/dL — AB
Ketones, ur: NEGATIVE mg/dL
Nitrite: NEGATIVE
Protein, ur: 30 mg/dL — AB
Specific Gravity, Urine: 1.011 (ref 1.005–1.030)
WBC, UA: >50 WBC/hpf — ABNORMAL HIGH (ref 0–5)
pH: 5 (ref 5.0–8.0)

## 2021-07-21 LAB — HIV ANTIBODY (ROUTINE TESTING W REFLEX): HIV Screen 4th Generation wRfx: NONREACTIVE

## 2021-07-21 LAB — BASIC METABOLIC PANEL
Anion gap: 9 (ref 5–15)
BUN: 61 mg/dL — ABNORMAL HIGH (ref 8–23)
CO2: 16 mmol/L — ABNORMAL LOW (ref 22–32)
Calcium: 7.4 mg/dL — ABNORMAL LOW (ref 8.9–10.3)
Chloride: 105 mmol/L (ref 98–111)
Creatinine, Ser: 3.85 mg/dL — ABNORMAL HIGH (ref 0.61–1.24)
GFR, Estimated: 17 mL/min — ABNORMAL LOW (ref >60)
Glucose, Bld: 135 mg/dL — ABNORMAL HIGH (ref 70–99)
Potassium: 3.3 mmol/L — ABNORMAL LOW (ref 3.5–5.1)
Sodium: 130 mmol/L — ABNORMAL LOW (ref 135–145)

## 2021-07-21 LAB — RAPID URINE DRUG SCREEN, HOSP PERFORMED
Amphetamines: NOT DETECTED
Barbiturates: NOT DETECTED
Benzodiazepines: NOT DETECTED
Cocaine: POSITIVE — AB
Opiates: NOT DETECTED
Tetrahydrocannabinol: NOT DETECTED

## 2021-07-21 LAB — CBC WITH DIFFERENTIAL/PLATELET
Abs Immature Granulocytes: 0.02 10*3/uL (ref 0.00–0.07)
Basophils Absolute: 0 10*3/uL (ref 0.0–0.1)
Basophils Relative: 0 %
Eosinophils Absolute: 0 10*3/uL (ref 0.0–0.5)
Eosinophils Relative: 0 %
HCT: 38.9 % — ABNORMAL LOW (ref 39.0–52.0)
Hemoglobin: 13.8 g/dL (ref 13.0–17.0)
Immature Granulocytes: 0 %
Lymphocytes Relative: 11 %
Lymphs Abs: 0.8 10*3/uL (ref 0.7–4.0)
MCH: 31.7 pg (ref 26.0–34.0)
MCHC: 35.5 g/dL (ref 30.0–36.0)
MCV: 89.4 fL (ref 80.0–100.0)
Monocytes Absolute: 0.6 10*3/uL (ref 0.1–1.0)
Monocytes Relative: 8 %
Neutro Abs: 6 10*3/uL (ref 1.7–7.7)
Neutrophils Relative %: 81 %
Platelets: 88 10*3/uL — ABNORMAL LOW (ref 150–400)
RBC: 4.35 MIL/uL (ref 4.22–5.81)
RDW: 13.8 % (ref 11.5–15.5)
WBC: 7.4 10*3/uL (ref 4.0–10.5)
nRBC: 0 % (ref 0.0–0.2)

## 2021-07-21 LAB — PROTIME-INR
INR: 1.1 (ref 0.8–1.2)
Prothrombin Time: 14.2 seconds (ref 11.4–15.2)

## 2021-07-21 LAB — HEMOGLOBIN A1C
Hgb A1c MFr Bld: 5.5 % (ref 4.8–5.6)
Mean Plasma Glucose: 111.15 mg/dL

## 2021-07-21 LAB — APTT: aPTT: 28 seconds (ref 24–36)

## 2021-07-21 LAB — TROPONIN I (HIGH SENSITIVITY)
Troponin I (High Sensitivity): 45 ng/L — ABNORMAL HIGH (ref <18)
Troponin I (High Sensitivity): 38 ng/L — ABNORMAL HIGH (ref <18)

## 2021-07-21 LAB — LIPASE, BLOOD: Lipase: 36 U/L (ref 11–51)

## 2021-07-21 LAB — BRAIN NATRIURETIC PEPTIDE: B Natriuretic Peptide: 43.8 pg/mL (ref 0.0–100.0)

## 2021-07-21 MED ORDER — LORAZEPAM 2 MG/ML IJ SOLN
0.0000 mg | Freq: Four times a day (QID) | INTRAMUSCULAR | Status: AC
  Administered 2021-07-22 – 2021-07-23 (×6): 2 mg via INTRAVENOUS
  Filled 2021-07-21 (×3): qty 1
  Filled 2021-07-21: qty 2
  Filled 2021-07-21: qty 1

## 2021-07-21 MED ORDER — FOLIC ACID 5 MG/ML IJ SOLN
1.0000 mg | Freq: Every day | INTRAMUSCULAR | Status: DC
  Administered 2021-07-22 – 2021-08-07 (×15): 1 mg via INTRAVENOUS
  Filled 2021-07-21 (×21): qty 0.2

## 2021-07-21 MED ORDER — THIAMINE HCL 100 MG PO TABS
100.0000 mg | ORAL_TABLET | Freq: Every day | ORAL | Status: DC
  Administered 2021-07-21 – 2021-07-27 (×2): 100 mg via ORAL
  Filled 2021-07-21 (×4): qty 1

## 2021-07-21 MED ORDER — ACETAMINOPHEN 650 MG RE SUPP: 650.0000 mg | Freq: Four times a day (QID) | RECTAL | Status: DC | PRN

## 2021-07-21 MED ORDER — HEPARIN SODIUM (PORCINE) 5000 UNIT/ML IJ SOLN
5000.0000 [IU] | Freq: Three times a day (TID) | INTRAMUSCULAR | Status: DC
  Administered 2021-07-21: 5000 [IU] via SUBCUTANEOUS
  Filled 2021-07-21: qty 1

## 2021-07-21 MED ORDER — ACETAMINOPHEN 325 MG PO TABS
650.0000 mg | ORAL_TABLET | Freq: Four times a day (QID) | ORAL | Status: DC | PRN
  Administered 2021-07-22 – 2021-07-28 (×2): 650 mg via ORAL
  Filled 2021-07-21 (×3): qty 2

## 2021-07-21 MED ORDER — SODIUM CHLORIDE 0.9 % IV SOLN: INTRAVENOUS | Status: AC

## 2021-07-21 MED ORDER — THIAMINE HCL 100 MG/ML IJ SOLN
100.0000 mg | Freq: Every day | INTRAMUSCULAR | Status: DC
  Administered 2021-07-22 – 2021-07-28 (×5): 100 mg via INTRAVENOUS
  Filled 2021-07-21 (×6): qty 2

## 2021-07-21 MED ORDER — LIDOCAINE VISCOUS HCL 2 % MT SOLN
15.0000 mL | Freq: Once | OROMUCOSAL | Status: AC
  Administered 2021-07-21: 15 mL via ORAL
  Filled 2021-07-21: qty 15

## 2021-07-21 MED ORDER — SODIUM CHLORIDE 0.9 % IV BOLUS
1000.0000 mL | Freq: Once | INTRAVENOUS | Status: AC
  Administered 2021-07-21: 1000 mL via INTRAVENOUS

## 2021-07-21 MED ORDER — LORAZEPAM 1 MG PO TABS
0.0000 mg | ORAL_TABLET | Freq: Four times a day (QID) | ORAL | Status: AC
  Administered 2021-07-21 (×2): 1 mg via ORAL
  Filled 2021-07-21 (×2): qty 1

## 2021-07-21 MED ORDER — NICOTINE 14 MG/24HR TD PT24
14.0000 mg | MEDICATED_PATCH | Freq: Every day | TRANSDERMAL | Status: DC
  Administered 2021-07-21 – 2021-08-07 (×18): 14 mg via TRANSDERMAL
  Filled 2021-07-21 (×18): qty 1

## 2021-07-21 MED ORDER — BOOST / RESOURCE BREEZE PO LIQD CUSTOM
1.0000 | Freq: Three times a day (TID) | ORAL | Status: DC
Start: 2021-07-21 — End: 2021-07-27
  Administered 2021-07-21 – 2021-07-27 (×13): 1 via ORAL

## 2021-07-21 MED ORDER — ALUM & MAG HYDROXIDE-SIMETH 200-200-20 MG/5ML PO SUSP
30.0000 mL | Freq: Once | ORAL | Status: AC
Start: 2021-07-21 — End: 2021-07-21
  Administered 2021-07-21: 30 mL via ORAL
  Filled 2021-07-21: qty 30

## 2021-07-21 MED ORDER — LORAZEPAM 1 MG PO TABS: 0.0000 mg | ORAL_TABLET | Freq: Two times a day (BID) | ORAL | Status: AC

## 2021-07-21 MED ORDER — LORAZEPAM 2 MG/ML IJ SOLN
0.0000 mg | Freq: Two times a day (BID) | INTRAMUSCULAR | Status: AC
  Administered 2021-07-23 – 2021-07-25 (×4): 2 mg via INTRAVENOUS
  Filled 2021-07-21 (×5): qty 1

## 2021-07-21 MED ORDER — ONDANSETRON HCL 4 MG/2ML IJ SOLN
4.0000 mg | Freq: Four times a day (QID) | INTRAMUSCULAR | Status: DC | PRN
  Filled 2021-07-21: qty 2

## 2021-07-21 MED ORDER — PANTOPRAZOLE SODIUM 40 MG IV SOLR
40.0000 mg | Freq: Two times a day (BID) | INTRAVENOUS | Status: DC
  Administered 2021-07-21 – 2021-07-26 (×11): 40 mg via INTRAVENOUS
  Filled 2021-07-21 (×11): qty 10

## 2021-07-21 MED ORDER — ONDANSETRON HCL 4 MG PO TABS: 4.0000 mg | ORAL_TABLET | Freq: Four times a day (QID) | ORAL | Status: DC | PRN

## 2021-07-21 MED ORDER — APIXABAN 5 MG PO TABS
5.0000 mg | ORAL_TABLET | Freq: Two times a day (BID) | ORAL | Status: DC
  Administered 2021-07-21: 5 mg via ORAL
  Filled 2021-07-21: qty 1

## 2021-07-21 NOTE — ED Triage Notes (Signed)
Pt reports chest pain in center of chest for 3 days as well as indigestion. Pt reports bloody emesis this AM. Pt reports feeling worsening pain when his indigestion kicks in. EMS reported EKG of A-flutter. Pt was hypotensive w/ BP of 60/46 and was given 1.1L fluids.  ?

## 2021-07-21 NOTE — ED Notes (Addendum)
Pt given more warm blankets. Resting comfortably with call bell within reach.  ?

## 2021-07-21 NOTE — ED Notes (Signed)
Pt reported another episode of chest pain, this RN snapped an EKG and gave it to Vanita Panda, MD.  ?

## 2021-07-21 NOTE — H&P (Addendum)
?Date: 07/19/2021     ?     ?     ?Patient Name:  Austin Cobb MRN: [id number]  ?DOB: 1961-03-09 Age / Sex: 61 y.o., male   ?PCP: Patient, No Pcp Per (Inactive)    ?     ?     ?Medical Service: Internal Medicine Teaching Service    ?     ?     ?Attending Physician: Dr. Carmin Muskrat, MD    ?First Contact: [name], MS3 Pager: [phone]  ?Second Contact: Mitzie Na, MD Pager: [phone]  ?Third Contact Rick Duff, MD Pager: [phone]  ?     ?After Hours (After 5p/  First Contact Pager: [phone]  ?weekends / holidays): Second Contact Pager: [phone]  ? ?Chief Complaint: Chest Pain ? ?History of Present Illness: Austin Cobb is a 61 y.o. male with past medical history of combined systolic and diastolic heart failure (EF <20%), HTN, atrial flutter on chronic anticoagulation, CKD stage IIIb, bipolar disorder, and a history of heparin-induced thrombocytopenia who presented to the ED for evaluation for 5 days of chest pain, nausea, vomiting, lightheadedness, and fatigue. He describes his chest pain as a dull sensation in the center of his chest that has been constant and progressively worsening. He does not recall any specific precipitating events. He also states that he has pain in his esophagus with swallowing that he feels is related to his chest pain. He states that he has been told that he has acid reflux in the past, and he does take over the counter medication for heartburn. He states this medication has not resolved his pain over the past 5 days. Additionally, he endorses nausea, vomiting, and diarrhea over this time. He does not recall how frequently he has had bowel movements or episodes of emesis but states it is between one and five times. He describes one episode of hematemesis the night prior to the day of admission but otherwise states the vomiting has been nonbloody and nonbilious. He reports that he has had less of an appetite secondary to his nausea and vomiting, but says he has  tried to remain hydrated. He does  ? ?He does endorse drinking two 40 oz beers every day but may have had more alcohol than this over the past few days. He does state his last drink was the afternoon prior to the day of admission. He states that his lightheadedness and fatigue are most noticeable right after he stands up to walk across the room.  ? ?He denies fever, chills, sick contacts, shortness of breath, abdominal pain, changes in vision, or headaches.  ? ?Meds:  ?Current Outpatient Medications  ?Medication Instructions  ? apixaban (ELIQUIS) 5 mg, Oral, 2 times daily  ? carvedilol (COREG) 25 mg, Oral, 2 times daily with meals  ? dapagliflozin propanediol (FARXIGA) 10 mg, Oral, Daily before breakfast  ? lactose free nutrition (BOOST) LIQD 237 mLs, Oral, Daily  ? sacubitril-valsartan (ENTRESTO) 24-26 MG 1 tablet, Oral, 2 times daily, 1 tablet by mouth twice a day  ? ? ? ?Allergies: ?Allergies as of 08/08/2021 - Review Complete 08/06/2021  ?Allergen Reaction Noted  ? Adhesive [tape] Other (See Comments) 06/16/2019  ? ?Past Medical History:  ?Diagnosis Date  ? Anxiety   ? Arthritis   ? Bipolar 1 disorder ([place])   ? Depression   ? Bipolar, not on meds/pt. stopped meds on own over 12 yrs ago  ? GERD (gastroesophageal reflux disease)   ? History of colon polyps   ?  Manic disorder ([place])   ? Noncompliance 05/14/2019  ? Substance abuse ([place])   ? 32yr ago smoke crack and marjuana  ? Tobacco abuse 05/13/2012  ? ? ?Family History: No known family history. ? ?Social History: Smokes one pack of cigarettes per day. 40 pack year smoking history. Drinks 80 oz of beer per day (2 40 oz). Endorses cocaine use approximately twice per week. Lives at home with two roommates. No difficulty getting or taking his medications recently.  ? ?Review of Systems: ?A complete ROS was negative except as per HPI.  ? ?Physical Exam: ?Blood pressure 104/75, pulse (!) 102, temperature (!) 97 ?F (36.1 ?C), temperature source Rectal, resp. rate 16, height  '5\' 10"'$  (1.778 m), weight 68 kg, SpO2 100 %. ?Constitutional: Thin, mal-nourished. No distress.  ?HENT: Normocephalic and atraumatic, EOMI, conjunctiva normal, moist mucous membranes ?Cardiovascular: tachycardic with regular rhythm, S1 and S2 present, no murmurs, rubs, gallops.  Distal pulses intact. ?Respiratory: Effort is normal on room air.  Lungs are clear to auscultation bilaterally. ?GI: Soft. Non-distended. Non-tender. No appreciable organomegaly.  ?Musculoskeletal: Normal bulk and tone.  No peripheral edema noted. ?Neurological: Alert and oriented x4, no apparent focal deficits noted. ?Skin: Warm and dry.  No rash, erythema, lesions noted. ?Psychiatric: Normal mood and affect. Behavior is normal.  ? ?Labs: ?CBC ?   ?Component Value Date/Time  ? WBC 7.4 07/22/2021 0928  ? RBC 4.35 07/26/2021 0928  ? HGB 13.8 08/02/2021 0928  ? HGB WILL FOLLOW 11/22/2020 1422  ? HCT 38.9 (L) 08/01/2021 08527 ? HCT WILL FOLLOW 11/22/2020 1422  ? PLT 88 (L) 07/26/2021 0928  ? PLT WILL FOLLOW 11/22/2020 1422  ? MCV 89.4 08/05/2021 0928  ? MCV WILL FOLLOW 11/22/2020 1422  ? MCH 31.7 07/31/2021 0928  ? MCHC 35.5 07/24/2021 0928  ? RDW 13.8 07/24/2021 0928  ? RDW WILL FOLLOW 11/22/2020 1422  ? LYMPHSABS 0.8 08/08/2021 0928  ? LYMPHSABS WILL FOLLOW 11/22/2020 1422  ? MONOABS 0.6 07/13/2021 0928  ? EOSABS 0.0 07/16/2021 0928  ? EOSABS WILL FOLLOW 11/22/2020 1422  ? BASOSABS 0.0 08/02/2021 0928  ? BASOSABS WILL FOLLOW 11/22/2020 1422  ?  ? ?CMP  ?   ?Component Value Date/Time  ? NA 127 (L) 07/12/2021 07824 ? NA 144 11/22/2020 1422  ? K 3.7 07/30/2021 0928  ? CL 96 (L) 07/18/2021 02353 ? CO2 15 (L) 08/04/2021 06144 ? GLUCOSE 213 (H) 07/14/2021 03154 ? BUN 70 (H) 08/04/2021 00086 ? BUN 23 11/22/2020 1422  ? CREATININE 6.63 (H) 07/22/2021 07619 ? CREATININE 0.91 04/21/2014 1505  ? CALCIUM 7.5 (L) 07/10/2021 05093 ? PROT 6.9 07/12/2021 0928  ? ALBUMIN 3.4 (L) 07/31/2021 02671 ? AST 29 08/04/2021 0928  ? ALT 21 07/09/2021 0928  ? ALKPHOS 56  07/15/2021 0928  ? BILITOT 0.8 07/18/2021 0928  ? GFRNONAA 9 (L) 08/05/2021 02458 ? GFRAA 57 (L) 09/06/2019 1600  ? ? ?Imaging: ?DG Chest Port 1 View ? ?Result Date: 07/11/2021 ?CLINICAL DATA:  Shortness of breath.  Chest pain. EXAM: PORTABLE CHEST 1 VIEW COMPARISON:  10/16/2020 FINDINGS: The heart size and mediastinal contours are within normal limits. Both lungs are clear. The visualized skeletal structures are unremarkable. IMPRESSION: No active disease. Electronically Signed   By: TKerby MoorsM.D.   On: 07/15/2021 09:54   ? ?EKG: personally reviewed my interpretation is atrial flutter ? ?CXR: personally reviewed my interpretation is no acute pulmonary or cardiac process ? ?  Assessment & Plan by Problem: ?Active Problems: ?  * No active hospital problems. * ? ?Austin Cobb is a 61 y.o. male with a PMHx of combined systolic and diastolic heart failure (EF <20%), HTN, atrial flutter on chronic anticoagulation, CKD stage II-IIIa, bipolar disorder, and a history of heparin-induced thrombocytopenia ? ?Chest pain  ?Elevated troponins ?Patient presented for 5 days of persistent dull/aching chest pain with no associated shortness of breath or diaphoresis. He denies any precipitating events but does state that he feels like his esophagus is hurting. He denies any dysphagia to liquids or solids but did have some associated N/V. He does endorse history of acid reflux that is typically improved with OTC antacids, and specifically states antacids did not help his current symptoms. EKG showed 3:1 atrial flutter which has previously been diagnosed with no evidence of ischemia. Troponins 45-> 38.  ? ?Given patient's characterization of his chest pain as persistent dull/aching as well as his mildly elevated troponins that trended flat, there is a low suspicion for acute coronary syndrome. His mildly elevated troponins that trended flat, likely in the setting of his tachycardia or demand ischemia. Patient did report that he  feels he has pain in his esophagus and he could be having symptoms from severe GERD. Given he has had no dysphagia to solids or liquids there is a low suspicion for stricture. However, he could have a g

## 2021-07-21 NOTE — ED Provider Notes (Signed)
?[facility] ?Provider Note ? ? ?CSN: [id number] ?Arrival date & time: 07/13/2021  2683 ? ?  ? ?History ? ?Chief Complaint  ?Patient presents with  ? Chest Pain  ? ? ?Austin Cobb is a 61 y.o. male. ? ?HPI ?Patient presents via EMS with concern for fatigue, dyspnea, discomfort generalized.  Patient also has chest pain, now for about 3 days, he identifies it as being similar to prior episodes of indigestion.  He had 1 episode of hematemesis earlier today as well seemingly. ?EMS reports that the patient was hypotensive on arrival, 60/45, fluid resuscitation resulted in improved blood pressure, decreased tachycardia.  On arrival to the ED the patient's pain is seemingly resolved, he continues to complain of nausea, is requesting lights be turned down. ?  ? ?Home Medications ?Prior to Admission medications   ?Medication Sig Start Date End Date Taking? Authorizing Provider  ?carvedilol (COREG) 25 MG tablet Take 1 tablet (25 mg total) by mouth 2 (two) times daily with a meal. 12/21/20  Yes Chandrasekhar, Mahesh A, MD  ?dapagliflozin propanediol (FARXIGA) 10 MG TABS tablet Take 1 tablet (10 mg total) by mouth daily before breakfast. 09/19/20  Yes Donato Heinz, MD  ?lactose free nutrition (BOOST) LIQD Take 237 mLs by mouth daily.   Yes [provider]  ?sacubitril-valsartan (ENTRESTO) 24-26 MG Take 1 tablet by mouth 2 (two) times daily. 1 tablet by mouth twice a day 05/04/21  Yes Chandrasekhar, Mahesh A, MD  ?apixaban (ELIQUIS) 5 MG TABS tablet Take 1 tablet (5 mg total) by mouth 2 (two) times daily. ?Patient not taking: Reported on 08/01/2021 12/27/20   Werner Lean, MD  ?   ? ?Allergies    ?Adhesive [tape]   ? ?Review of Systems   ?Review of Systems  ?All other systems reviewed and are negative. ? ?Physical Exam ?Updated Vital Signs ?BP 104/75   Pulse (!) 102   Temp (!) 97 ?F (36.1 ?C) (Rectal)   Resp 16   Ht '5\' 10"'$  (1.778 m)   Wt 68 kg   SpO2 100%    BMI 21.52 kg/m?  ?Physical Exam ?Vitals and nursing note reviewed.  ?Constitutional:   ?   General: He is not in acute distress. ?   Appearance: He is well-developed.  ?HENT:  ?   Head: Normocephalic and atraumatic.  ?Eyes:  ?   Conjunctiva/sclera: Conjunctivae normal.  ?Cardiovascular:  ?   Rate and Rhythm: Regular rhythm. Tachycardia present.  ?Pulmonary:  ?   Breath sounds: Decreased breath sounds present.  ?Abdominal:  ?   General: There is no distension.  ?Skin: ?   General: Skin is warm and dry.  ?Neurological:  ?   Mental Status: He is alert and oriented to person, place, and time.  ? ? ?ED Results / Procedures / Treatments   ?Labs ?(all labs ordered are listed, but only abnormal results are displayed) ?Labs Reviewed  ?COMPREHENSIVE METABOLIC PANEL - Abnormal; Notable for the following components:  ?    Result Value  ? Sodium 127 (*)   ? Chloride 96 (*)   ? CO2 15 (*)   ? Glucose, Bld 213 (*)   ? BUN 70 (*)   ? Creatinine, Ser 6.63 (*)   ? Calcium 7.5 (*)   ? Albumin 3.4 (*)   ? GFR, Estimated 9 (*)   ? Anion gap 16 (*)   ? All other components within normal limits  ?CBC WITH DIFFERENTIAL/PLATELET - Abnormal;  Notable for the following components:  ? HCT 38.9 (*)   ? Platelets 88 (*)   ? All other components within normal limits  ?TROPONIN I (HIGH SENSITIVITY) - Abnormal; Notable for the following components:  ? Troponin I (High Sensitivity) 45 (*)   ? All other components within normal limits  ?TROPONIN I (HIGH SENSITIVITY) - Abnormal; Notable for the following components:  ? Troponin I (High Sensitivity) 38 (*)   ? All other components within normal limits  ?BRAIN NATRIURETIC PEPTIDE  ?ETHANOL  ?LIPASE, BLOOD  ?URINALYSIS, ROUTINE W REFLEX MICROSCOPIC  ?RAPID URINE DRUG SCREEN, HOSP PERFORMED  ? ? ?EKG ?EKG Interpretation ? ?Date/Time:  Saturday Jul 21 2021 09:11:54 EDT ?Ventricular Rate:  98 ?PR Interval:    ?QRS Duration: 90 ?QT Interval:  343 ?QTC Calculation: 422 ?R Axis:   63 ?Text  Interpretation: Atrial flutter ST-t wave abnormality Artifact Abnormal ECG Confirmed by Carmin Muskrat [phone]) on 07/14/2021 9:28:03 AM ? ?Radiology ?DG Chest Port 1 View ? ?Result Date: 08/03/2021 ?CLINICAL DATA:  Shortness of breath.  Chest pain. EXAM: PORTABLE CHEST 1 VIEW COMPARISON:  10/16/2020 FINDINGS: The heart size and mediastinal contours are within normal limits. Both lungs are clear. The visualized skeletal structures are unremarkable. IMPRESSION: No active disease. Electronically Signed   By: Kerby Moors M.D.   On: 07/12/2021 09:54   ? ?Procedures ?Procedures  ? ? ?Medications Ordered in ED ?Medications  ?LORazepam (ATIVAN) injection 0-4 mg (has no administration in time range)  ?  Or  ?LORazepam (ATIVAN) tablet 0-4 mg (has no administration in time range)  ?LORazepam (ATIVAN) injection 0-4 mg (has no administration in time range)  ?  Or  ?LORazepam (ATIVAN) tablet 0-4 mg (has no administration in time range)  ?thiamine tablet 100 mg (has no administration in time range)  ?  Or  ?thiamine (B-1) injection 100 mg (has no administration in time range)  ?alum & mag hydroxide-simeth (MAALOX/MYLANTA) 200-200-20 MG/5ML suspension 30 mL (has no administration in time range)  ?  And  ?lidocaine (XYLOCAINE) 2 % viscous mouth solution 15 mL (has no administration in time range)  ?sodium chloride 0.9 % bolus 1,000 mL (0 mLs Intravenous Stopped 07/21/2021 1058)  ?sodium chloride 0.9 % bolus 1,000 mL (0 mLs Intravenous Stopped 07/22/2021 1316)  ? ? ?ED Course/ Medical Decision Making/ A&P ?This patient with a Hx of cigarette addiction polysubstance abuse bipolar disorder heart failure presents to the ED for concern of dyspnea, chest pain, nausea, vomiting, possible hematemesis, this involves an extensive number of treatment options, and is a complaint that carries with it a high risk of complications and morbidity.   ? ?The differential diagnosis includes peptic ulcer disease, pancreatitis, heart failure exacerbation,  ACS ? ? ?Social Determinants of Health: ? ?Psychiatric disease, cardiac disease, cigarette addiction ? ?Additional history obtained: ? ?Additional history and/or information obtained from EMS, notable for HPI as above including vital signs with resuscitation and multiple rhythm strips as below: ?Rhythm strips x6 were reviewed, notable for atrial flutter, with rapid ventricular response, variable rate, from 100-1 30, with ST wave changes abnormal ? ? ? ?After the initial evaluation, orders, including: Continuous monitoring, fluids, labs x-ray were initiated. ? ? ?Patient placed on Cardiac and Pulse-Oximetry Monitors. ?The patient was maintained on a cardiac monitor.  The cardiac monitored showed an rhythm of 90/110 a flutter 3 1 block apparently ?The patient was also maintained on pulse oximetry. The readings were typically 97% room air normal ? ? ?On repeat  evaluation of the patient improved ? ?Lab Tests: ? ?I personally interpreted labs.  The pertinent results include: Acute kidney injury, with creatinine now greater than 6, BUN 70, hyponatremia.  Initially, the patient had elevated troponin, though this decreased after fluid resuscitation ? ?Imaging Studies ordered: ? ?I independently visualized and interpreted imaging which showed unremarkable chest x-ray ?I agree with the radiologist interpretation ? ?Consultations Obtained: ? ?I requested consultation with the internal medicine,  and discussed lab and imaging findings as well as pertinent plan - they recommend: Admission ? ?Dispostion / Final MDM: ? ?After consideration of the diagnostic results and the patient's response to treatment, this adult male with multiple medical issues, notably a flutter, bipolar disorder, alcohol use presents with pain in multiple areas, including his chest, has a soft, nonperitoneal exam, is awake, alert, moving all extremities appropriately, but is found to have findings concerning for acute kidney injury.  No evidence for  complicated withdrawal, notable of the patient's alcohol level is typically in the 100s, and today is negligible.  Patient started on CIWA protocol, received copious fluid resuscitation in the ED, after that was started by her EMS col

## 2021-07-21 NOTE — ED Notes (Signed)
EDP aware of low rectal temp, will continue to apply warm blankets to pt.  ?

## 2021-07-21 NOTE — Hospital Course (Addendum)
1. Hypovolemic hyponatremia Patient was admitted for sodium of 127 likely secondary to poor PO intake in the setting of nausea, vomiting, indigestion recently. The patient's sodium has corrected to 138, no neuro symptoms noted on admission related to correction of hyponatremia. Will encourage increased PO intake at discharge.   2. Acute alcohol withdrawal, Delirium tremens On admission the patient endorsed drinking 80 oz of beer per day, last drink the afternoon prior to admission. Initially the patient was pleasant and cooperative, but became delirious and agitated on hospital day 1 with CIWA scores in the low 20s necessitating Ativan and Librium 50 mg TID. The patient has since been tapered off of Librium and his agitation has improved. Will encourage abstinence from alcohol at discharge.    3. Chest pain, nausea, vomiting The patient presented to the hospital complaining of 5 days of chest pain, nausea, vomiting, indigestion, and one episode of hematemesis the day prior to admission. He endorsed chronic reflux symptoms but states that his OTC antacids had not helped his chest pain over the past 5 days. His troponins on admission were 45 that trended down to 48, EKG showed no evidence of ischemia. The patient's hemoglobin was stable around 12 during this admission, and he had no witnessed episodes of hematemesis. His pain improved with GI cocktails and PPIs, and it appears that his nausea and vomiting have largely resolved. Will encourage alcohol abstinence on discharge as well as continued OTC antacid use as needed for reflux symptoms. Plan to address starting a PPI at outpatient follow-up.    4. AKI on CKD 2-3a Patient presented with creatinine 6.6. Likely prerenal. Potential component of intrarenal as well given low systolic blood pressures when he was found. Patient does appear volume down on exam this AM. Continues to improve with IV fluids. Creatinine has now improved to 1.28 from 6.6 on admission,  which is approximately his baseline.  His PVR was 65cc and renal US was reassuring that there was no obstructive process. Will encourage hydration with water at discharge.    5. Normal anion gap metabolic acidosis Patient presented with bicarb 16 with anion gap of 9. Likely in the setting of patient's acute renal dysfunction. This improved with IV fluids, will encourage hydration at discharge.    6. Persistent atrial flutter Patient presented in atrial flutter due to continued alcohol misuse. Now appears sinus tachycardia and atrial flutter intermittently. Per cardiology note from 11/22/2020 they are aware of this, and their plan was rate control with coreg and anticoagulation with Eliquis. They planned to hold off on interventions as long as the patient was asymptomatic. He remains asymptomatic here not complaining of palpitations or discomfort related to his atrial flutter. The patient's Coreg was initially held due to concern for soft blood pressures. The patient's pressures slowly improved ,and metoprolol Tartrate was started for his tachycardia which has improved his heart rate some, but it remains elevated. Eliquis was held due to reported hematemesis with thrombocytopenia, will resume this at discharge as well as home Coreg.   7. HFrEF Patient with chronic HFrEF, last echo 08/01/2020 with ejection fraction less than 20%. He is on [place], [place] for this. These were held at admission given concern for soft blood pressures, but his pressures have improved, and these can be restarted at discharge.    8. Polysubstance abuse 9. Asymptomatic cystitis, glucosuria without diabetes 10. Chronic cirrhosis  FROM ADMISSION Takes entresto, coreg, farxiga. Takes these.    Chest pain starting three days ago.  Trouble sleeping. Persistent. Dull irritating pain.  Tooks some tums that did not help this time.  N/V for three days.   Threw up blood night prior to admission, blood alll red. . And  then also a week ago.    Feels like food is going to come back up.    Told had reflux but nothing like thisl. Never been on a medication for this.   SH: Smokes PPD for 40 years Alcohol drinks every day 1-2 42oz beers. Had symptoms of withdrawal but not since   PMH:  HFrEF Chand[place] last saw in September   Had a beer yesterday.  Uses cocaine twice a week.   Lives with two roommates mom and daughter.   No children.   FH:    Allergies:    Getting up out of bed short of breath and lightheaded.  Feel like some pain in esophagus.   Colonoscopy twice. No EGD.

## 2021-07-21 NOTE — ED Notes (Signed)
Pt ambulated to the bathroom with a steady gait.

## 2021-07-22 DIAGNOSIS — F10939 Alcohol use, unspecified with withdrawal, unspecified: Secondary | ICD-10-CM | POA: Diagnosis not present

## 2021-07-22 DIAGNOSIS — N170 Acute kidney failure with tubular necrosis: Secondary | ICD-10-CM | POA: Diagnosis present

## 2021-07-22 DIAGNOSIS — E876 Hypokalemia: Secondary | ICD-10-CM | POA: Diagnosis not present

## 2021-07-22 DIAGNOSIS — F10231 Alcohol dependence with withdrawal delirium: Secondary | ICD-10-CM | POA: Diagnosis not present

## 2021-07-22 DIAGNOSIS — F319 Bipolar disorder, unspecified: Secondary | ICD-10-CM | POA: Diagnosis present

## 2021-07-22 DIAGNOSIS — Z515 Encounter for palliative care: Secondary | ICD-10-CM | POA: Diagnosis not present

## 2021-07-22 DIAGNOSIS — J90 Pleural effusion, not elsewhere classified: Secondary | ICD-10-CM | POA: Diagnosis present

## 2021-07-22 DIAGNOSIS — R579 Shock, unspecified: Secondary | ICD-10-CM | POA: Diagnosis not present

## 2021-07-22 DIAGNOSIS — D649 Anemia, unspecified: Secondary | ICD-10-CM | POA: Diagnosis present

## 2021-07-22 DIAGNOSIS — I5043 Acute on chronic combined systolic (congestive) and diastolic (congestive) heart failure: Secondary | ICD-10-CM | POA: Diagnosis not present

## 2021-07-22 DIAGNOSIS — I130 Hypertensive heart and chronic kidney disease with heart failure and stage 1 through stage 4 chronic kidney disease, or unspecified chronic kidney disease: Secondary | ICD-10-CM | POA: Diagnosis present

## 2021-07-22 DIAGNOSIS — R0789 Other chest pain: Secondary | ICD-10-CM | POA: Diagnosis present

## 2021-07-22 DIAGNOSIS — J9601 Acute respiratory failure with hypoxia: Secondary | ICD-10-CM | POA: Diagnosis not present

## 2021-07-22 DIAGNOSIS — J15212 Pneumonia due to Methicillin resistant Staphylococcus aureus: Secondary | ICD-10-CM | POA: Diagnosis present

## 2021-07-22 DIAGNOSIS — I959 Hypotension, unspecified: Secondary | ICD-10-CM | POA: Diagnosis present

## 2021-07-22 DIAGNOSIS — G9341 Metabolic encephalopathy: Secondary | ICD-10-CM | POA: Diagnosis present

## 2021-07-22 DIAGNOSIS — Z66 Do not resuscitate: Secondary | ICD-10-CM | POA: Diagnosis not present

## 2021-07-22 DIAGNOSIS — R569 Unspecified convulsions: Secondary | ICD-10-CM | POA: Diagnosis not present

## 2021-07-22 DIAGNOSIS — F1027 Alcohol dependence with alcohol-induced persisting dementia: Secondary | ICD-10-CM | POA: Diagnosis present

## 2021-07-22 DIAGNOSIS — I5042 Chronic combined systolic (congestive) and diastolic (congestive) heart failure: Secondary | ICD-10-CM | POA: Diagnosis not present

## 2021-07-22 DIAGNOSIS — N179 Acute kidney failure, unspecified: Secondary | ICD-10-CM | POA: Diagnosis not present

## 2021-07-22 DIAGNOSIS — E43 Unspecified severe protein-calorie malnutrition: Secondary | ICD-10-CM | POA: Diagnosis present

## 2021-07-22 DIAGNOSIS — N390 Urinary tract infection, site not specified: Secondary | ICD-10-CM | POA: Diagnosis present

## 2021-07-22 DIAGNOSIS — F10931 Alcohol use, unspecified with withdrawal delirium: Secondary | ICD-10-CM | POA: Diagnosis not present

## 2021-07-22 DIAGNOSIS — E1122 Type 2 diabetes mellitus with diabetic chronic kidney disease: Secondary | ICD-10-CM | POA: Diagnosis present

## 2021-07-22 DIAGNOSIS — E870 Hyperosmolality and hypernatremia: Secondary | ICD-10-CM | POA: Diagnosis not present

## 2021-07-22 DIAGNOSIS — I4892 Unspecified atrial flutter: Secondary | ICD-10-CM | POA: Diagnosis present

## 2021-07-22 DIAGNOSIS — I5021 Acute systolic (congestive) heart failure: Secondary | ICD-10-CM | POA: Diagnosis not present

## 2021-07-22 DIAGNOSIS — R1319 Other dysphagia: Secondary | ICD-10-CM | POA: Diagnosis not present

## 2021-07-22 DIAGNOSIS — D696 Thrombocytopenia, unspecified: Secondary | ICD-10-CM | POA: Diagnosis present

## 2021-07-22 DIAGNOSIS — F17210 Nicotine dependence, cigarettes, uncomplicated: Secondary | ICD-10-CM | POA: Diagnosis not present

## 2021-07-22 DIAGNOSIS — F1410 Cocaine abuse, uncomplicated: Secondary | ICD-10-CM | POA: Diagnosis present

## 2021-07-22 DIAGNOSIS — I5022 Chronic systolic (congestive) heart failure: Secondary | ICD-10-CM | POA: Diagnosis not present

## 2021-07-22 DIAGNOSIS — Z978 Presence of other specified devices: Secondary | ICD-10-CM | POA: Diagnosis not present

## 2021-07-22 DIAGNOSIS — N1832 Chronic kidney disease, stage 3b: Secondary | ICD-10-CM | POA: Diagnosis present

## 2021-07-22 DIAGNOSIS — R64 Cachexia: Secondary | ICD-10-CM | POA: Diagnosis present

## 2021-07-22 DIAGNOSIS — Z8701 Personal history of pneumonia (recurrent): Secondary | ICD-10-CM | POA: Diagnosis not present

## 2021-07-22 DIAGNOSIS — Z7189 Other specified counseling: Secondary | ICD-10-CM | POA: Diagnosis not present

## 2021-07-22 DIAGNOSIS — R0600 Dyspnea, unspecified: Secondary | ICD-10-CM | POA: Diagnosis not present

## 2021-07-22 DIAGNOSIS — I480 Paroxysmal atrial fibrillation: Secondary | ICD-10-CM | POA: Diagnosis not present

## 2021-07-22 DIAGNOSIS — I483 Typical atrial flutter: Secondary | ICD-10-CM | POA: Diagnosis not present

## 2021-07-22 DIAGNOSIS — R570 Cardiogenic shock: Secondary | ICD-10-CM | POA: Diagnosis not present

## 2021-07-22 DIAGNOSIS — K7460 Unspecified cirrhosis of liver: Secondary | ICD-10-CM | POA: Diagnosis present

## 2021-07-22 LAB — HEPATITIS PANEL, ACUTE
HCV Ab: NONREACTIVE
Hep A IgM: NONREACTIVE
Hep B C IgM: NONREACTIVE
Hepatitis B Surface Ag: NONREACTIVE

## 2021-07-22 LAB — CBC
HCT: 37 % — ABNORMAL LOW (ref 39.0–52.0)
Hemoglobin: 12.5 g/dL — ABNORMAL LOW (ref 13.0–17.0)
MCH: 30.4 pg (ref 26.0–34.0)
MCHC: 33.8 g/dL (ref 30.0–36.0)
MCV: 90 fL (ref 80.0–100.0)
Platelets: 69 10*3/uL — ABNORMAL LOW (ref 150–400)
RBC: 4.11 MIL/uL — ABNORMAL LOW (ref 4.22–5.81)
RDW: 13.7 % (ref 11.5–15.5)
WBC: 6.7 10*3/uL (ref 4.0–10.5)
nRBC: 0 % (ref 0.0–0.2)

## 2021-07-22 LAB — COMPREHENSIVE METABOLIC PANEL
ALT: 18 U/L (ref 0–44)
AST: 22 U/L (ref 15–41)
Albumin: 3 g/dL — ABNORMAL LOW (ref 3.5–5.0)
Alkaline Phosphatase: 48 U/L (ref 38–126)
Anion gap: 10 (ref 5–15)
BUN: 55 mg/dL — ABNORMAL HIGH (ref 8–23)
CO2: 17 mmol/L — ABNORMAL LOW (ref 22–32)
Calcium: 7.5 mg/dL — ABNORMAL LOW (ref 8.9–10.3)
Chloride: 105 mmol/L (ref 98–111)
Creatinine, Ser: 2.96 mg/dL — ABNORMAL HIGH (ref 0.61–1.24)
GFR, Estimated: 23 mL/min — ABNORMAL LOW (ref >60)
Glucose, Bld: 146 mg/dL — ABNORMAL HIGH (ref 70–99)
Potassium: 3.3 mmol/L — ABNORMAL LOW (ref 3.5–5.1)
Sodium: 132 mmol/L — ABNORMAL LOW (ref 135–145)
Total Bilirubin: 0.3 mg/dL (ref 0.3–1.2)
Total Protein: 6 g/dL — ABNORMAL LOW (ref 6.5–8.1)

## 2021-07-22 MED ORDER — SODIUM CHLORIDE 0.9 % IV SOLN
1.0000 g | INTRAVENOUS | Status: DC
  Administered 2021-07-23 – 2021-07-24 (×2): 1 g via INTRAVENOUS
  Filled 2021-07-22 (×4): qty 10

## 2021-07-22 MED ORDER — SODIUM CHLORIDE 0.9 % IV SOLN
1.0000 g | Freq: Once | INTRAVENOUS | Status: AC
  Administered 2021-07-22: 1 g via INTRAVENOUS

## 2021-07-22 MED ORDER — CHLORDIAZEPOXIDE HCL 5 MG PO CAPS
50.0000 mg | ORAL_CAPSULE | Freq: Three times a day (TID) | ORAL | Status: DC
  Administered 2021-07-22 – 2021-07-25 (×9): 50 mg via ORAL
  Filled 2021-07-22: qty 10
  Filled 2021-07-22: qty 2
  Filled 2021-07-22 (×8): qty 10

## 2021-07-22 MED ORDER — SODIUM CHLORIDE 0.9 % IV SOLN: INTRAVENOUS | Status: AC

## 2021-07-22 MED ORDER — SODIUM CHLORIDE 0.9 % IV BOLUS
250.0000 mL | Freq: Once | INTRAVENOUS | Status: AC
  Administered 2021-07-22: 250 mL via INTRAVENOUS

## 2021-07-22 MED ORDER — POTASSIUM CHLORIDE 20 MEQ PO PACK
40.0000 meq | PACK | ORAL | Status: AC
  Administered 2021-07-22: 40 meq via ORAL
  Filled 2021-07-22 (×2): qty 2

## 2021-07-22 NOTE — Progress Notes (Signed)
Bladder scan result 65 cc, post residual urine. ?

## 2021-07-22 NOTE — Plan of Care (Signed)
?  Problem: Health Behavior/Discharge Planning: ?Goal: Ability to manage health-related needs will improve ?07/22/2021 1243 by Sheppard Penton, RN ?Outcome: Not Progressing ?  ?

## 2021-07-22 NOTE — Plan of Care (Signed)
?  Problem: Education: ?Goal: Knowledge of General Education information will improve ?Description: Including pain rating scale, medication(s)/side effects and non-pharmacologic comfort measures ?Outcome: Progressing ?  ?Problem: Health Behavior/Discharge Planning: ?Goal: Ability to manage health-related needs will improve ?Outcome: Progressing ?  ?Problem: Clinical Measurements: ?Goal: Ability to maintain clinical measurements within normal limits will improve ?Outcome: Progressing ?Goal: Will remain free from infection ?Outcome: Progressing ?Goal: Diagnostic test results will improve ?Outcome: Progressing ?Goal: Respiratory complications will improve ?Outcome: Progressing ?Goal: Cardiovascular complication will be avoided ?Outcome: Progressing ?  ?Problem: Nutrition: ?Goal: Adequate nutrition will be maintained ?Outcome: Progressing ?  ?Problem: Coping: ?Goal: Level of anxiety will decrease ?Outcome: Progressing ?  ?Problem: Elimination: ?Goal: Will not experience complications related to bowel motility ?Outcome: Progressing ?Goal: Will not experience complications related to urinary retention ?Outcome: Progressing ?  ?Problem: Pain Managment: ?Goal: General experience of comfort will improve ?Outcome: Progressing ?  ?Problem: Safety: ?Goal: Ability to remain free from injury will improve ?Outcome: Progressing ?  ?Problem: Safety: ?Goal: Non-violent Restraint(s) ?Outcome: Progressing ?  ?

## 2021-07-22 NOTE — Progress Notes (Signed)
Patient refused to stay for two more hours for observation.Right after his thorancentesis,patient demanded to be discharged.No signs of distress upon discharged.Refused ambulatory /pulmonary test for oxygen screening. ?

## 2021-07-22 NOTE — Plan of Care (Signed)
  Problem: Education: Goal: Knowledge of General Education information will improve Description: Including pain rating scale, medication(s)/side effects and non-pharmacologic comfort measures Outcome: Adequate for Discharge   

## 2021-07-22 NOTE — Progress Notes (Addendum)
? ?Subjective: This a.m. at bedside, patient agitated and confused.  Appears to be withdrawing from alcohol. Patients responses to questions were nonsensical. Appears t ? ?Objective: ? ?Vital signs in last 24 hours: ?Vitals:  ? 07/24/2021 2103 07/22/21 0100 07/22/21 0104 07/22/21 0408  ?BP: (!) 138/91  (!) 145/110 (!) 130/96  ?Pulse: 94  (!) 117 61  ?Resp: '18  18 15  '$ ?Temp: 98.2 ?F (36.8 ?C) 98.7 ?F (37.1 ?C)  97.8 ?F (36.6 ?C)  ?TempSrc: Oral   Oral  ?SpO2: 97%  98% 100%  ?Weight:      ?Height:      ? ?Constitutional: Mal-nourished, confused, and agitated.  ?HENT:  ?Head: Normocephalic and atraumatic.  ?Eyes: EOM are normal.  ?Neck: Normal range of motion.  ?Cardiovascular: Normal rate, regular rhythm, intact distal pulses. No lower extremity edema  ?Pulmonary: Non labored breathing on room air, no wheezing or rales  ?Abdominal: Soft. Normal bowel sounds. Non distended and non tender ?Musculoskeletal: Normal range of motion.     ?   General: No tenderness or edema.  ?Neurological: Alert but not oriented. Ataxic, tremulous.  ?Skin: Skin is warm and dry.  ? ? ?Assessment/Plan: ? ?Principal Problem: ?  AKI (acute kidney injury) ([place]) ? ?#Delirium Tremens ?#Acute Alcohol Withdrawal  ?#N/V/D ?Patient was confused, agitated, ataxic, and tremulous on exam this a.m.  His CIWA score was 20-22.  Patient had previously presented to the hospital with nausea, vomiting and diarrhea.  Unable to assess his symptoms given his agitation however no reported episodes of emesis per chart review.  Given his report of heavy alcohol use and potentially acute binge drinking past several days this is likely what caused his presenting symptoms. ?-Continue symptomatic support for GI upset ?-Start librium taper, libirium '50mg'$  TID + CIWA  ?-Zofran as needed ?-IV PPI BID  ?-IV fluids ? ?#AKI on CKD 2-3a ?Likely prerenal. Potential component of intrarenal as well given low systolic blood pressures when he was found. Patient does appear volume down  on exam this AM. Continues to improve with IV fluids. sCr 2.96 from 6.6 on admission.  His PVR was 65cc and renal US was reassuring that there was no obstructive process.  ?-Continue gentle IV fluids given patient's cardiomyopathy and low EF. ?-Strict I&O's  ?-Continue to hold home entresto, coreg, and farxiga  ? ?#NAGMA ?Likely in the setting of patient's acute renal dysfunction. Improved with IV fluids. ?-Continue to monitor with daily BMPs ? ?#Hyponatremia ?Likely secondary to hypovolemia. Improved with IV fluids.  ?-Continue to monitor with IV fluids  ? ?#Persistent atrial flutter ?Patient remains in atrial flutter due to continued alcohol misuse.  ?-Holding Eliquis due to reported hematemesis and thrombocytopenia   ? ?#Polysubstance abuse ?The patient endorses cocaine use approximately two times per week. He also endorses drinking 80 oz of beer every day and reports his last drink day prior to admission. Appears to be in delirium tremens per above. He states that he has had multiple instances in the past of alcohol withdrawal when he stops drinking. ?-Medications per above for DTs ?-Consult TOC for substance use  ?-CIWA  ?-Thiamine supplementation ? ?#Hypotension (Resolved) ?Resolved with IV fluids. Likely 2/2 hypovolemia.  ?-Continue to hold home entresto, coreg, and farxiga  ? ?#Possible UTI ?#Glucosuria without diabetes ?Patient reported urinary frequency for the past several months. His urinalysis was notable for RBCs, WBCs, and few bacteria. He was unable to tell the IMTS team if he is having any other symptoms. On Farxiga for  his heart failure - we are holding this. Will likely need to discontinue at discharge.  ?-Start IV ceftriaxone for presumed UTI ?-F/u gonorrhea chlamydia  ? ?#Cirrhosis, appears compensated ?Previously diagnosed with abdominal imaging. Mildly decreased albumin. Platelets were 88 on admission. PT/INR and aptt WNL. RUQ [place] w/o evidence of portal hypertension.  ?-Nothing to do acutely ?-  Consider lactulose if encephalopathy does not improve with treatment of DT ?-Will get resources for patient regarding alcohol cessation.  ? ?Diet: CLD  ?VTE: Eliquis ?IVF: Gentle NS ?Code: Full ?  ?Prior to Admission Living Arrangement: Home ?Anticipated Discharge Location: TBD ?Barriers to Discharge: AKI ?Dispo: Anticipated discharge in approximately 1-2 day(s).  ? ?Rick Duff, MD ?07/22/2021, 10:14 AM ?After 5pm on weekdays and 1pm on weekends: On Call pager [phone] ? ?

## 2021-07-22 NOTE — Progress Notes (Signed)
Pt arrived to unit from 46M   CCMD called ,CHG given, CIWA ,Will continue to monitor.  ? ?Phoebe Sharps, RN ? ? 07/22/21 1359  ?Vitals  ?Temp 98.1 ?F (36.7 ?C)  ?Temp Source Oral  ?BP (!) 149/99  ?MAP (mmHg) 109  ?BP Location Left Arm  ?BP Method Automatic  ?Patient Position (if appropriate) Lying  ?Pulse Rate (!) 105  ?Pulse Rate Source Monitor  ?ECG Heart Rate (!) 103  ?Resp 19  ?Level of Consciousness  ?Level of Consciousness Alert  ?Oxygen Therapy  ?SpO2 100 %  ?O2 Device Room Air  ?Pain Assessment  ?Pain Scale Faces  ?Pain Score 0  ?Glasgow Coma Scale  ?Eye Opening 4  ?Best Verbal Response (NON-intubated) 5  ?Best Motor Response 6  ?[place] Coma Scale Score 15  ?MEWS Score  ?MEWS Temp 0  ?MEWS Systolic 0  ?MEWS Pulse 1  ?MEWS RR 0  ?MEWS LOC 0  ?MEWS Score 1  ?MEWS Score Color Green  ? ? ? ?

## 2021-07-23 DIAGNOSIS — E876 Hypokalemia: Secondary | ICD-10-CM | POA: Diagnosis present

## 2021-07-23 DIAGNOSIS — F10931 Alcohol use, unspecified with withdrawal delirium: Secondary | ICD-10-CM | POA: Diagnosis not present

## 2021-07-23 DIAGNOSIS — N179 Acute kidney failure, unspecified: Secondary | ICD-10-CM | POA: Diagnosis not present

## 2021-07-23 LAB — URINE CULTURE: Culture: <10000 — AB

## 2021-07-23 LAB — CBC WITH DIFFERENTIAL/PLATELET
Abs Immature Granulocytes: 0.03 10*3/uL (ref 0.00–0.07)
Basophils Absolute: 0 10*3/uL (ref 0.0–0.1)
Basophils Relative: 1 %
Eosinophils Absolute: 0 10*3/uL (ref 0.0–0.5)
Eosinophils Relative: 0 %
HCT: 35.4 % — ABNORMAL LOW (ref 39.0–52.0)
Hemoglobin: 12.3 g/dL — ABNORMAL LOW (ref 13.0–17.0)
Immature Granulocytes: 1 %
Lymphocytes Relative: 15 %
Lymphs Abs: 0.7 10*3/uL (ref 0.7–4.0)
MCH: 30.9 pg (ref 26.0–34.0)
MCHC: 34.7 g/dL (ref 30.0–36.0)
MCV: 88.9 fL (ref 80.0–100.0)
Monocytes Absolute: 0.5 10*3/uL (ref 0.1–1.0)
Monocytes Relative: 10 %
Neutro Abs: 3.8 10*3/uL (ref 1.7–7.7)
Neutrophils Relative %: 73 %
Platelets: 72 10*3/uL — ABNORMAL LOW (ref 150–400)
RBC: 3.98 MIL/uL — ABNORMAL LOW (ref 4.22–5.81)
RDW: 14 % (ref 11.5–15.5)
WBC: 5.1 10*3/uL (ref 4.0–10.5)
nRBC: 0 % (ref 0.0–0.2)

## 2021-07-23 LAB — COMPREHENSIVE METABOLIC PANEL
ALT: 16 U/L (ref 0–44)
AST: 23 U/L (ref 15–41)
Albumin: 3.1 g/dL — ABNORMAL LOW (ref 3.5–5.0)
Alkaline Phosphatase: 51 U/L (ref 38–126)
Anion gap: 8 (ref 5–15)
BUN: 29 mg/dL — ABNORMAL HIGH (ref 8–23)
CO2: 18 mmol/L — ABNORMAL LOW (ref 22–32)
Calcium: 8 mg/dL — ABNORMAL LOW (ref 8.9–10.3)
Chloride: 112 mmol/L — ABNORMAL HIGH (ref 98–111)
Creatinine, Ser: 1.45 mg/dL — ABNORMAL HIGH (ref 0.61–1.24)
GFR, Estimated: 54 mL/min — ABNORMAL LOW (ref >60)
Glucose, Bld: 102 mg/dL — ABNORMAL HIGH (ref 70–99)
Potassium: 3.4 mmol/L — ABNORMAL LOW (ref 3.5–5.1)
Sodium: 138 mmol/L (ref 135–145)
Total Bilirubin: 0.5 mg/dL (ref 0.3–1.2)
Total Protein: 6.4 g/dL — ABNORMAL LOW (ref 6.5–8.1)

## 2021-07-23 MED ORDER — LORAZEPAM 2 MG/ML IJ SOLN
1.0000 mg | INTRAMUSCULAR | Status: AC | PRN
  Administered 2021-07-23 – 2021-07-25 (×2): 2 mg via INTRAVENOUS
  Filled 2021-07-23 (×4): qty 1

## 2021-07-23 MED ORDER — ADULT MULTIVITAMIN W/MINERALS CH
1.0000 | ORAL_TABLET | Freq: Every day | ORAL | Status: DC
  Administered 2021-07-23 – 2021-07-27 (×4): 1 via ORAL
  Filled 2021-07-23 (×5): qty 1

## 2021-07-23 MED ORDER — GUAIFENESIN ER 600 MG PO TB12
600.0000 mg | ORAL_TABLET | Freq: Two times a day (BID) | ORAL | Status: DC
  Administered 2021-07-23 – 2021-07-28 (×9): 600 mg via ORAL
  Filled 2021-07-23 (×10): qty 1

## 2021-07-23 MED ORDER — LORAZEPAM 1 MG PO TABS: 1.0000 mg | ORAL_TABLET | ORAL | Status: AC | PRN

## 2021-07-23 NOTE — Progress Notes (Signed)
Initial Nutrition Assessment ? ?DOCUMENTATION CODES:  ? ?Severe malnutrition in context of social or environmental circumstances ? ?INTERVENTION:  ? ?Boost Breeze po TID, each supplement provides 250 kcal and 9 grams of protein. ? ?Continue MVI with minerals daily. ? ?NUTRITION DIAGNOSIS:  ? ?Severe Malnutrition related to social / environmental circumstances (alcohol use disorder) as evidenced by severe fat depletion, severe muscle depletion. ? ?GOAL:  ? ?Patient will meet greater than or equal to 90% of their needs ? ?MONITOR:  ? ?PO intake, Supplement acceptance, Diet advancement ? ?REASON FOR ASSESSMENT:  ? ?Consult ?Assessment of nutrition requirement/status, Poor PO ? ?ASSESSMENT:  ? ?61 yo male admitted with chest pain, bloody emesis. PMH includes bipolar disorder, A flutter, CKD stage IIIb, CHF, cirrhosis, HIT, alcohol use disorder. ? ?Discussed patient with RN. He is confused and has not been taking POs. ?Patient unable to provide any nutrition hx.  ?Weight history reviewed. No significant weight changes noted.  ? ?Currently on clear liquids. ?Meal intakes: 0% ? ?Labs reviewed. K 3.4 ? ?Medications reviewed and include folic acid, MVI with minerals, thiamine. ? ?Patient meets criteria for severe malnutrition with severe depletion of muscle and subcutaneous fat mass. ? ?NUTRITION - FOCUSED PHYSICAL EXAM: ? ?Flowsheet Row Most Recent Value  ?Orbital Region Moderate depletion  ?Upper Arm Region Severe depletion  ?Thoracic and Lumbar Region Severe depletion  ?Buccal Region Moderate depletion  ?Temple Region Moderate depletion  ?Clavicle Bone Region Severe depletion  ?Clavicle and Acromion Bone Region Severe depletion  ?Scapular Bone Region Severe depletion  ?Dorsal Hand Severe depletion  ?Patellar Region Severe depletion  ?Anterior Thigh Region Severe depletion  ?Posterior Calf Region Severe depletion  ?Edema (RD Assessment) None  ?Hair Reviewed  ?Eyes Reviewed  ?Mouth Reviewed  ?Skin Reviewed  ?Nails Reviewed   ? ?  ? ? ?Diet Order:   ?Diet Order   ? ?       ?  Diet clear liquid Room service appropriate? Yes; Fluid consistency: Thin; Fluid restriction: 1800 mL Fluid  Diet effective now       ?  ? ?  ?  ? ?  ? ? ?EDUCATION NEEDS:  ? ?Not appropriate for education at this time ? ?Skin:  Skin Assessment: Reviewed RN Assessment ? ?Last BM:  5/12 ? ?Height:  ? ?Ht Readings from Last 1 Encounters:  ?07/29/2021 '5\' 9"'$  (1.753 m)  ? ? ?Weight:  ? ?Wt Readings from Last 1 Encounters:  ?08/03/2021 60 kg  ? ? ? ?BMI:  Body mass index is 19.54 kg/m?. ? ?Estimated Nutritional Needs:  ? ?Kcal:  1900-2100 ? ?Protein:  90-100 gm ? ?Fluid:  1.9-2.1 L ? ? ? ?Lucas Mallow RD, LDN, CNSC ?Please refer to Amion for contact information.                                                       ? ?

## 2021-07-23 NOTE — Progress Notes (Signed)
? ?Subjective:  ? ?Overnight events: No acute events overnight. ? ?Patient seen and evaluated on rounds this morning. He was sleeping comfortably and responded to touch but does not open his eyes or respond verbally.  ? ? ?Objective: ? ?Vital signs in last 24 hours: ?Vitals:  ? 07/22/21 2320 07/23/21 0320 07/23/21 0741 07/23/21 1000  ?BP: (!) 169/118 (!) 164/117 (!) 149/107 (!) 139/101  ?Pulse:  (!) 125 (!) 134 (!) 134  ?Resp: '20 19 20 20  '$ ?Temp: 97.7 ?F (36.5 ?C) 97.6 ?F (36.4 ?C) 99.4 ?F (37.4 ?C) 99 ?F (37.2 ?C)  ?TempSrc: Axillary Axillary Oral Oral  ?SpO2: 100% 99% 100% 98%  ?Weight:      ?Height:      ? ?Constitutional: Mal-nourished, sleeping. Awakes to touch but does not open his eyes or respond verbally.  ?Head: Normocephalic and atraumatic.  ?Neck: Normal range of motion.  ?Cardiovascular: Normal rate, regular rhythm, intact distal pulses. No lower extremity edema  ?Pulmonary: Non labored breathing on room air, no wheezing or rales  ?Abdominal: Soft. Normal bowel sounds. Non distended ?Musculoskeletal: Normal range of motion. No tenderness or edema.  ?Neurological: Sleeping. Awakes to touch but does not open his eyes or respond verbally. Tremulous.  ?Skin: Skin is warm and dry.  ? ? ?Assessment/Plan: ? ?Principal Problem: ?  AKI (acute kidney injury) ([place]) ?Active Problems: ?  Alcohol withdrawal ([place]) ? ?#Delirium Tremens ?#Acute Alcohol Withdrawal  ?#N/V/D ?Patient was confused, agitated, ataxic, and tremulous on exam this a.m.  His CIWA score was 20-22.  Patient had previously presented to the hospital with nausea, vomiting and diarrhea.  Unable to assess his symptoms given his agitation however no reported episodes of emesis per chart review.  Given his report of heavy alcohol use and potentially acute binge drinking past several days this is likely what caused his presenting symptoms. The patient did have one reported episode of hematemesis but has not had any witnessed episodes here in the hospital.  Consider GI follow-up as an outpatient given pain with swallowing and possible hematemesis at home. ?-Ativan PRN for withdrawal ?-Continue symptomatic support for GI upset ?-Start librium taper, libirium '50mg'$  TID + CIWA  ?-Zofran as needed ?-IV PPI BID  ?-IV fluids ? ?#AKI on CKD 2-3a ?Likely prerenal. Potential component of intrarenal as well given low systolic blood pressures when he was found. Patient does appear volume down on exam this AM. Continues to improve with IV fluids. sCr 1.45 from 6.6 on admission, which is approaching his baseline.  His PVR was 65cc and renal US was reassuring that there was no obstructive process.  ?-Continue gentle IV fluids given patient's cardiomyopathy and low EF. ?-Strict I&O's  ?-Continue to hold home entresto, coreg, and farxiga  ? ?#NAGMA ?Likely in the setting of patient's acute renal dysfunction. Improved with IV fluids. ?-Continue to monitor with daily BMPs ? ?#Hyponatremia ?Likely secondary to hypovolemia. Corrected with IV fluids.  ?-Continue to monitor with IV fluids  ? ?#Persistent atrial flutter ?Patient presented in atrial flutter due to continued alcohol misuse. Now appears sinus tachycardia. ?-Holding Eliquis due to reported hematemesis and thrombocytopenia   ? ?#Polysubstance abuse ?The patient endorses cocaine use approximately two times per week. He also endorses drinking 80 oz of beer every day and reports his last drink day prior to admission. Appears to be in delirium tremens per above. He states that he has had multiple instances in the past of alcohol withdrawal when he stops drinking. ?-Medications per above for DTs ?-  Consult TOC for substance use  ?-CIWA  ?-Thiamine supplementation ? ?#Hypotension (Resolved) ?Resolved with IV fluids. Likely 2/2 hypovolemia.  ?-Continue to hold home entresto, coreg, and farxiga  ? ?#Possible UTI ?#Glucosuria without diabetes ?Patient reported urinary frequency for the past several months. His urinalysis was notable for  RBCs, WBCs, and few bacteria. He was unable to tell the IMTS team if he is having any other symptoms. On Farxiga for his heart failure - we are holding this. Will likely need to discontinue at discharge. Patient's urine culture grew Enterobacter sensitive to Ceftriaxone.  ?-Continue IV ceftriaxone ?-F/u gonorrhea chlamydia  ? ?#Cirrhosis, appears compensated ?Previously diagnosed with abdominal imaging. Mildly decreased albumin. Platelets were 88 on admission. PT/INR and aptt WNL. RUQ [place] w/o evidence of portal hypertension.  ?-Nothing to do acutely ?- Consider lactulose if encephalopathy does not improve with treatment of DT ?-Will get resources for patient regarding alcohol cessation.  ? ?Diet: CLD  ?VTE: Eliquis ?IVF: Gentle NS ?Code: Full ?  ?Prior to Admission Living Arrangement: Home ?Anticipated Discharge Location: TBD ?Barriers to Discharge: Tachycardia, agitation secondary to alcohol withdrawal ?Dispo: Anticipated discharge in approximately 1-2 day(s).  ? ?Austin Cobb, Medical Student ?07/23/2021, 11:30 AM ?After 5pm on weekdays and 1pm on weekends: On Call pager [phone] ? ?

## 2021-07-24 DIAGNOSIS — F10931 Alcohol use, unspecified with withdrawal delirium: Secondary | ICD-10-CM | POA: Diagnosis not present

## 2021-07-24 DIAGNOSIS — N179 Acute kidney failure, unspecified: Secondary | ICD-10-CM | POA: Diagnosis not present

## 2021-07-24 LAB — CBC
HCT: 35.5 % — ABNORMAL LOW (ref 39.0–52.0)
Hemoglobin: 12.3 g/dL — ABNORMAL LOW (ref 13.0–17.0)
MCH: 31.2 pg (ref 26.0–34.0)
MCHC: 34.6 g/dL (ref 30.0–36.0)
MCV: 90.1 fL (ref 80.0–100.0)
Platelets: 73 10*3/uL — ABNORMAL LOW (ref 150–400)
RBC: 3.94 MIL/uL — ABNORMAL LOW (ref 4.22–5.81)
RDW: 13.8 % (ref 11.5–15.5)
WBC: 6.1 10*3/uL (ref 4.0–10.5)
nRBC: 0 % (ref 0.0–0.2)

## 2021-07-24 LAB — BASIC METABOLIC PANEL
Anion gap: 7 (ref 5–15)
BUN: 20 mg/dL (ref 8–23)
CO2: 20 mmol/L — ABNORMAL LOW (ref 22–32)
Calcium: 8 mg/dL — ABNORMAL LOW (ref 8.9–10.3)
Chloride: 113 mmol/L — ABNORMAL HIGH (ref 98–111)
Creatinine, Ser: 1.52 mg/dL — ABNORMAL HIGH (ref 0.61–1.24)
GFR, Estimated: 51 mL/min — ABNORMAL LOW (ref >60)
Glucose, Bld: 107 mg/dL — ABNORMAL HIGH (ref 70–99)
Potassium: 3.5 mmol/L (ref 3.5–5.1)
Sodium: 140 mmol/L (ref 135–145)

## 2021-07-24 MED ORDER — LIDOCAINE VISCOUS HCL 2 % MT SOLN
15.0000 mL | Freq: Once | OROMUCOSAL | Status: AC
  Administered 2021-07-24: 15 mL via ORAL
  Filled 2021-07-24: qty 15

## 2021-07-24 MED ORDER — METOPROLOL TARTRATE 5 MG/5ML IV SOLN
2.5000 mg | Freq: Once | INTRAVENOUS | Status: AC
  Administered 2021-07-24: 2.5 mg via INTRAVENOUS
  Filled 2021-07-24: qty 5

## 2021-07-24 MED ORDER — METOPROLOL TARTRATE 25 MG PO TABS
25.0000 mg | ORAL_TABLET | Freq: Two times a day (BID) | ORAL | Status: DC
  Administered 2021-07-24 (×2): 25 mg via ORAL
  Filled 2021-07-24 (×2): qty 1

## 2021-07-24 MED ORDER — ALUM & MAG HYDROXIDE-SIMETH 200-200-20 MG/5ML PO SUSP
30.0000 mL | Freq: Once | ORAL | Status: AC
  Administered 2021-07-24: 30 mL via ORAL
  Filled 2021-07-24: qty 30

## 2021-07-24 NOTE — Progress Notes (Shared)
? ?Subjective:  ? ?Overnight events: No acute events overnight. ? ?Patient seen and evaluated on rounds this morning. He states that his night was "torture." States he is in emotional pain. He is requesting some water this morning. States chest has been hurting but that this is the same pain he has been having. No other complaints or concerns at this time. ? ? ?Objective: ? ?Vital signs in last 24 hours: ?Vitals:  ? 07/23/21 2333 07/24/21 0326 07/24/21 0700 07/24/21 0800  ?BP: (!) 138/102 107/83 (!) 129/92 120/87  ?Pulse: (!) 135 (!) 135 (!) 134   ?Resp: 15 19 (!) 23 (!) 23  ?Temp: 98.1 ?F (36.7 ?C) 98.1 ?F (36.7 ?C) 99.4 ?F (37.4 ?C)   ?TempSrc: Axillary Axillary Axillary   ?SpO2: 97% 100% 100%   ?Weight:      ?Height:      ? ?Constitutional: Mal-nourished, sleeping. Awakes to touch but does not open his eyes or respond verbally.  ?Head: Normocephalic and atraumatic.  ?Neck: Normal range of motion.  ?Cardiovascular: Normal rate, regular rhythm, intact distal pulses. No lower extremity edema  ?Pulmonary: Non labored breathing on room air, no wheezing or rales  ?Abdominal: Soft. Normal bowel sounds. Non distended ?Musculoskeletal: Normal range of motion. No tenderness or edema.  ?Neurological: Sleeping. Awakes to touch but does not open his eyes or respond verbally. Tremulous.  ?Skin: Skin is warm and dry.  ? ? ?Assessment/Plan: ? ?Principal Problem: ?  Alcohol withdrawal ([place]) ?Active Problems: ?  AKI (acute kidney injury) ([place]) ?  Hypokalemia ? ?#Delirium Tremens ?#Acute Alcohol Withdrawal  ?#N/V/D ?Patient was confused, agitated, ataxic, and tremulous on exam this a.m.  His CIWA score was 20-22.  Patient had previously presented to the hospital with nausea, vomiting and diarrhea.  Unable to assess his symptoms given his agitation however no reported episodes of emesis per chart review.  Given his report of heavy alcohol use and potentially acute binge drinking past several days this is likely what caused his  presenting symptoms. The patient did have one reported episode of hematemesis but has not had any witnessed episodes here in the hospital. Consider GI follow-up as an outpatient given pain with swallowing and possible hematemesis at home. ?-Ativan PRN for withdrawal ?-Continue symptomatic support for GI upset ?-Start librium taper, libirium '50mg'$  TID + CIWA  ?-Zofran as needed ?-IV PPI BID  ?-IV fluids ? ?#AKI on CKD 2-3a ?Likely prerenal. Potential component of intrarenal as well given low systolic blood pressures when he was found. Patient does appear volume down on exam this AM. Continues to improve with IV fluids. sCr 1.45 from 6.6 on admission, which is approaching his baseline.  His PVR was 65cc and renal US was reassuring that there was no obstructive process.  ?-Continue gentle IV fluids given patient's cardiomyopathy and low EF. ?-Strict I&O's  ?-Continue to hold home entresto, coreg, and farxiga  ? ?#NAGMA ?Likely in the setting of patient's acute renal dysfunction. Improved with IV fluids. ?-Continue to monitor with daily BMPs ? ?#Hyponatremia ?Likely secondary to hypovolemia. Corrected with IV fluids.  ?-Continue to monitor with IV fluids  ? ?#Persistent atrial flutter ?Patient presented in atrial flutter due to continued alcohol misuse. Now appears sinus tachycardia. ?-Holding Eliquis due to reported hematemesis and thrombocytopenia   ? ?#Polysubstance abuse ?The patient endorses cocaine use approximately two times per week. He also endorses drinking 80 oz of beer every day and reports his last drink day prior to admission. Appears to be in delirium  tremens per above. He states that he has had multiple instances in the past of alcohol withdrawal when he stops drinking. ?-Medications per above for DTs ?-Consult TOC for substance use  ?-CIWA  ?-Thiamine supplementation ? ?#Hypotension (Resolved) ?Resolved with IV fluids. Likely 2/2 hypovolemia.  ?-Continue to hold home entresto, coreg, and farxiga   ? ?#Possible UTI ?#Glucosuria without diabetes ?Patient reported urinary frequency for the past several months. His urinalysis was notable for RBCs, WBCs, and few bacteria. He was unable to tell the IMTS team if he is having any other symptoms. On Farxiga for his heart failure - we are holding this. Will likely need to discontinue at discharge. Patient's urine culture grew Enterobacter sensitive to Ceftriaxone.  ?-Continue IV ceftriaxone ?-F/u gonorrhea chlamydia  ? ?#Cirrhosis, appears compensated ?Previously diagnosed with abdominal imaging. Mildly decreased albumin. Platelets were 88 on admission. PT/INR and aptt WNL. RUQ [place] w/o evidence of portal hypertension.  ?-Nothing to do acutely ?- Consider lactulose if encephalopathy does not improve with treatment of DT ?-Will get resources for patient regarding alcohol cessation.  ? ?Diet: CLD  ?VTE: Eliquis ?IVF: Gentle NS ?Code: Full ?  ?Prior to Admission Living Arrangement: Home ?Anticipated Discharge Location: TBD ?Barriers to Discharge: Tachycardia, agitation secondary to alcohol withdrawal ?Dispo: Anticipated discharge in approximately 1-2 day(s).  ? ?Orvis Brill, MD ?07/24/2021, 8:40 AM ?After 5pm on weekdays and 1pm on weekends: On Call pager [phone] ? ?

## 2021-07-24 NOTE — Progress Notes (Signed)
? ?Subjective:  ? ?Overnight events: No acute events overnight. ? ?Patient seen and evaluated on rounds this morning. He was sleeping comfortably when we arrived but was easily arousable. Upon waking, he expressed frustration at the care he has received while here in the hospital, stating that he has emotional pain and feels like he is in jail. We had a conversation about the necessity of soft restraints for his own safety and that he was not safe to be discharged home at this time. He did not seem satisfied by this and continued to express frustration at the care he is receiving.  ? ?He did complain of some chest pain similar to the pain that he had on admission. No other physical complaints. ? ? ?Objective: ? ?Vital signs in last 24 hours: ?Vitals:  ? 07/23/21 2333 07/24/21 0326 07/24/21 0700 07/24/21 0800  ?BP: (!) 138/102 107/83 (!) 129/92 120/87  ?Pulse: (!) 135 (!) 135 (!) 134   ?Resp: 15 19 (!) 23 (!) 23  ?Temp: 98.1 ?F (36.7 ?C) 98.1 ?F (36.7 ?C) 99.4 ?F (37.4 ?C)   ?TempSrc: Axillary Axillary Axillary   ?SpO2: 97% 100% 100%   ?Weight:      ?Height:      ? ?Constitutional: Mal-nourished, in no acute distress ?Head: Normocephalic and atraumatic.  ?Neck: Normal range of motion.  ?Cardiovascular: Tachycardia with regular rhythm, intact distal pulses. No lower extremity edema  ?Pulmonary: Non labored breathing on room air, no wheezing or rales  ?Abdominal: Soft. Normal bowel sounds. Non distended ?Musculoskeletal: Normal range of motion. No tenderness or edema.  ?Neurological: Sleeping. Awakes to touch but does not open his eyes or respond verbally. Tremulous.  ?Skin: Skin is warm and dry.  ? ? ?Assessment/Plan: ? ?Principal Problem: ?  Alcohol withdrawal ([place]) ?Active Problems: ?  AKI (acute kidney injury) ([place]) ?  Hypokalemia ? ?#Delirium Tremens ?#Acute Alcohol Withdrawal  ?#N/V/D ?#Chest pain ?Patient was confused, agitated, ataxic, and tremulous on exam this a.m.  His CIWA score was 20-22.  Patient had  previously presented to the hospital with nausea, vomiting and diarrhea.  Unable to assess his symptoms given his agitation however no reported episodes of emesis per chart review.  Given his report of heavy alcohol use and potentially acute binge drinking past several days this is likely what caused his presenting symptoms. The patient did have one reported episode of hematemesis but has not had any witnessed episodes here in the hospital. Consider GI follow-up as an outpatient given pain with swallowing and possible hematemesis at home. Patient also was complaining of some chest pain described as indigestion on admission that was improved with GI cocktail. Troponins were 45 and 38, and EKG showed no evidence of ischemia. The patient is complaining of similar pain today likely secondary to reflux, so we will try GI cocktail again today in addition to the PPI that he has been receiving BID.  ?-Ativan PRN for withdrawal ?-Continue symptomatic support for GI upset ?-Start librium taper, libirium '50mg'$  TID + CIWA  ?-Zofran as needed ?-IV PPI BID  ?-GI cocktail ?-IV fluids ? ?#AKI on CKD 2-3a ?Likely prerenal. Potential component of intrarenal as well given low systolic blood pressures when he was found. Patient does appear volume down on exam this AM. Continues to improve with IV fluids. sCr 1.52 from 6.6 on admission, which is approaching his baseline.  His PVR was 65cc and renal US was reassuring that there was no obstructive process.  ?-Continue gentle IV fluids given patient's  cardiomyopathy and low EF. ?-Strict I&O's  ?-Continue to hold home entresto, coreg, and farxiga  ? ?#NAGMA ?Likely in the setting of patient's acute renal dysfunction. Improved with IV fluids. ?-Continue to monitor with daily BMPs ? ?#Hyponatremia ?Likely secondary to hypovolemia. Corrected with IV fluids.  ?-Continue to monitor with IV fluids  ? ?#Persistent atrial flutter ?Patient presented in atrial flutter due to continued alcohol misuse.  Now appears sinus tachycardia. The patient's Coreg was initially held due to concern for soft blood pressures initially. Given that the patient's pressures have been normal to mildly elevated recently, we will start Tartrate for his tachycardia. ?-Holding Eliquis due to reported hematemesis and thrombocytopenia   ?-Start Tartrate ? ?#Polysubstance abuse ?The patient endorses cocaine use approximately two times per week. He also endorses drinking 80 oz of beer every day and reports his last drink day prior to admission. Appears to be in delirium tremens per above. He states that he has had multiple instances in the past of alcohol withdrawal when he stops drinking. ?-Medications per above for DTs ?-Consult TOC for substance use  ?-CIWA  ?-Thiamine supplementation ? ?#Hypotension (Resolved) ?Resolved with IV fluids. Likely 2/2 hypovolemia.  ?-Continue to hold home entresto, coreg, and farxiga  ?-Starting low dose tartrate for tachycardia ? ?#Possible UTI ?#Glucosuria without diabetes ?Patient reported urinary frequency for the past several months. His urinalysis was notable for RBCs, WBCs, and few bacteria. He was unable to tell the IMTS team if he is having any other symptoms. On Farxiga for his heart failure - we are holding this. Will likely need to discontinue at discharge. Patient's urine culture grew Enterobacter sensitive to Ceftriaxone.  ?-Continue IV ceftriaxone ?-F/u gonorrhea chlamydia  ? ?#Cirrhosis, appears compensated ?Previously diagnosed with abdominal imaging. Mildly decreased albumin. Platelets were 88 on admission. PT/INR and aptt WNL. RUQ [place] w/o evidence of portal hypertension.  ?-Nothing to do acutely ?-Consider lactulose if encephalopathy does not improve with treatment of DT ?-Will get resources for patient regarding alcohol cessation.  ? ?Diet: CLD  ?VTE: Holding Eliquis for thrombocytopenia ?IVF: Gentle NS ?Code: Full ?  ?Prior to Admission Living Arrangement: Home ?Anticipated Discharge  Location: TBD ?Barriers to Discharge: Tachycardia, agitation secondary to alcohol withdrawal ?Dispo: Anticipated discharge in approximately 1-2 day(s).  ? ?[name], Medical Student ?07/24/2021, 9:21 AM ?After 5pm on weekdays and 1pm on weekends: On Call pager [phone] ? ?

## 2021-07-25 DIAGNOSIS — F10931 Alcohol use, unspecified with withdrawal delirium: Secondary | ICD-10-CM | POA: Diagnosis not present

## 2021-07-25 LAB — COMPREHENSIVE METABOLIC PANEL
ALT: 13 U/L (ref 0–44)
AST: 17 U/L (ref 15–41)
Albumin: 2.9 g/dL — ABNORMAL LOW (ref 3.5–5.0)
Alkaline Phosphatase: 43 U/L (ref 38–126)
Anion gap: 8 (ref 5–15)
BUN: 20 mg/dL (ref 8–23)
CO2: 20 mmol/L — ABNORMAL LOW (ref 22–32)
Calcium: 8.3 mg/dL — ABNORMAL LOW (ref 8.9–10.3)
Chloride: 114 mmol/L — ABNORMAL HIGH (ref 98–111)
Creatinine, Ser: 1.28 mg/dL — ABNORMAL HIGH (ref 0.61–1.24)
GFR, Estimated: >60 mL/min (ref >60)
Glucose, Bld: 92 mg/dL (ref 70–99)
Potassium: 3.7 mmol/L (ref 3.5–5.1)
Sodium: 142 mmol/L (ref 135–145)
Total Bilirubin: 0.7 mg/dL (ref 0.3–1.2)
Total Protein: 6.6 g/dL (ref 6.5–8.1)

## 2021-07-25 LAB — MAGNESIUM: Magnesium: 1.7 mg/dL (ref 1.7–2.4)

## 2021-07-25 LAB — CBC
HCT: 37.7 % — ABNORMAL LOW (ref 39.0–52.0)
Hemoglobin: 12.9 g/dL — ABNORMAL LOW (ref 13.0–17.0)
MCH: 31.2 pg (ref 26.0–34.0)
MCHC: 34.2 g/dL (ref 30.0–36.0)
MCV: 91.1 fL (ref 80.0–100.0)
Platelets: 96 10*3/uL — ABNORMAL LOW (ref 150–400)
RBC: 4.14 MIL/uL — ABNORMAL LOW (ref 4.22–5.81)
RDW: 14.3 % (ref 11.5–15.5)
WBC: 6.2 10*3/uL (ref 4.0–10.5)
nRBC: 0 % (ref 0.0–0.2)

## 2021-07-25 MED ORDER — CHLORDIAZEPOXIDE HCL 5 MG PO CAPS
20.0000 mg | ORAL_CAPSULE | Freq: Three times a day (TID) | ORAL | Status: DC
  Administered 2021-07-25 (×2): 20 mg via ORAL
  Filled 2021-07-25 (×3): qty 4

## 2021-07-25 MED ORDER — CARVEDILOL 25 MG PO TABS
25.0000 mg | ORAL_TABLET | Freq: Two times a day (BID) | ORAL | Status: DC
  Administered 2021-07-25 – 2021-07-28 (×6): 25 mg via ORAL
  Filled 2021-07-25 (×7): qty 1

## 2021-07-25 MED ORDER — METOPROLOL TARTRATE 50 MG PO TABS
50.0000 mg | ORAL_TABLET | Freq: Two times a day (BID) | ORAL | Status: DC
  Administered 2021-07-25: 50 mg via ORAL
  Filled 2021-07-25: qty 1

## 2021-07-25 MED ORDER — SUCRALFATE 1 GM/10ML PO SUSP
1.0000 g | Freq: Three times a day (TID) | ORAL | Status: DC
  Administered 2021-07-25 – 2021-07-28 (×7): 1 g via ORAL
  Filled 2021-07-25 (×10): qty 10

## 2021-07-25 MED ORDER — LIDOCAINE VISCOUS HCL 2 % MT SOLN
15.0000 mL | Freq: Once | OROMUCOSAL | Status: AC
  Administered 2021-07-25: 15 mL via ORAL
  Filled 2021-07-25: qty 15

## 2021-07-25 MED ORDER — ALUM & MAG HYDROXIDE-SIMETH 200-200-20 MG/5ML PO SUSP
30.0000 mL | Freq: Once | ORAL | Status: AC
  Administered 2021-07-25: 30 mL via ORAL
  Filled 2021-07-25: qty 30

## 2021-07-25 MED ORDER — LACTATED RINGERS IV BOLUS
1000.0000 mL | Freq: Once | INTRAVENOUS | Status: AC
  Administered 2021-07-25: 1000 mL via INTRAVENOUS

## 2021-07-25 MED ORDER — CHLORDIAZEPOXIDE HCL 5 MG PO CAPS: 25.0000 mg | ORAL_CAPSULE | Freq: Three times a day (TID) | ORAL | Status: DC

## 2021-07-25 MED ORDER — LACTATED RINGERS IV BOLUS: 1000.0000 mL | Freq: Once | INTRAVENOUS | Status: DC

## 2021-07-25 NOTE — Progress Notes (Signed)
MD  on call  notified for HR up to 128, BP 150/113. No new orders given. Will continue to monitor.  ? ? ?

## 2021-07-25 NOTE — Progress Notes (Addendum)
? ?Subjective:  ? ?Overnight events: No acute events overnight. ? ?Patient seen and evaluated on rounds this morning. He was sleeping comfortably when we arrived and was arousable to voice but appeared sleepy and did not participate much in conversation. He expressed that he was thirsty and complained of some mild chest pain that he states is improved from the pain that he had on admission.  ? ? ?Objective: ? ?Vital signs in last 24 hours: ?Vitals:  ? 07/24/21 1941 07/24/21 2312 07/25/21 0503 07/25/21 0747  ?BP: 126/90 (!) 126/95 (!) 136/99 (!) 156/142  ?Pulse: (!) 111 (!) 116 (!) 111 (!) 132  ?Resp: '19 20 20 20  '$ ?Temp: 97.6 ?F (36.4 ?C) 98 ?F (36.7 ?C) 97.9 ?F (36.6 ?C) 98.1 ?F (36.7 ?C)  ?TempSrc: Oral Oral Oral Oral  ?SpO2: 100% 100% 100% 98%  ?Weight:      ?Height:      ? ?Constitutional: Mal-nourished, in no acute distress ?Head: Normocephalic and atraumatic.  ?Neck: Normal range of motion.  ?Cardiovascular: Tachycardia with regular rhythm, intact distal pulses. No lower extremity edema  ?Pulmonary: Non labored breathing on room air, no wheezing or rales  ?Abdominal: Soft. Normal bowel sounds. Non distended ?Musculoskeletal: Normal range of motion. No tenderness or edema.  ?Neurological: Sleeping. Awakes to touch but does not open his eyes or respond verbally. Tremulous.  ?Skin: Skin is warm and dry.  ? ? ?Assessment/Plan: ? ?Principal Problem: ?  Alcohol withdrawal ([place]) ?Active Problems: ?  AKI (acute kidney injury) ([place]) ?  Hypokalemia ? ?#Delirium Tremens ?#Acute Alcohol Withdrawal  ?#N/V/D ?#Chest pain ?The patient was sleeping this morning on rounds and was arousable to voice but appeared sleepy and did not participate much in conversation. He did not report any nausea, though he did express that he was thirsty and complained of chest pain that felt similar to his pain on admission but was much more mild in nature. His CIWA scores have been as high as 22 during this admission, but recently have been less  than 5 over the last 24h, so we will start to taper his Librium and continue to monitor him for withdrawal activity. He does have some mild tremors at this time but has not been agitated. The patient did have one reported episode of hematemesis prior to admission but has not had any witnessed episodes here in the hospital. Consider GI follow-up as an outpatient given pain with swallowing and possible hematemesis at home. Patient also was complaining of some chest pain described as indigestion on admission that was improved with GI cocktail. Troponins were 45 and 38, and EKG showed no evidence of ischemia. The patient is complaining of similar pain today likely secondary to reflux but states that his pain is much more mild than the pain that brought him to the hospital. ?-Continue symptomatic support for GI upset ?-Start librium taper, libirium 25 mg TID  + prn Ativan based on CIWA  ?-Zofran as needed ?-PO PPI BID  ?-GI cocktail ?-IV fluids ? ?#AKI on CKD 2-3a ?Likely prerenal. Potential component of intrarenal as well given low systolic blood pressures when he was found. Patient does appear volume down on exam this AM. Continues to improve with IV fluids. sCr 1.28 from 6.6 on admission, which is approaching his baseline.  His PVR was 65cc and renal US was reassuring that there was no obstructive process.  ?-Continue gentle IV fluids given patient's cardiomyopathy and low EF. ?-Continue to hold home entresto, coreg, and farxiga  ? ?#  NAGMA ?Likely in the setting of patient's acute renal dysfunction. Improved with IV fluids. ?-Continue to monitor with daily BMPs ? ?#Hyponatremia ?Likely secondary to hypovolemia. Corrected with IV fluids.  ?-Continue to monitor with IV fluids  ? ?#Persistent atrial flutter ?Patient presented in atrial flutter due to continued alcohol misuse. Now appears sinus tachycardia and atrial flutter intermittently. The patient's Coreg was initially held due to concern for soft blood pressures  initially. Given that the patient's pressures have been normal to mildly elevated recently, metoprolol Tartrate was started for his tachycardia which has improved his heart rate some, but it remains elevated. ?-Holding Eliquis due to reported hematemesis and thrombocytopenia, will likely resume in the AM   ?-Continue metoprolol  ?-Will given gentile IV fluids per above.  ? ?#Polysubstance abuse ?The patient endorses cocaine use approximately two times per week. He also endorses drinking 80 oz of beer every day and reports his last drink day prior to admission. Appears to be in delirium tremens per above. He states that he has had multiple instances in the past of alcohol withdrawal when he stops drinking. ?-Medications per above for DTs ?-Consult TOC for substance use  ?-CIWA  ?-Thiamine supplementation ? ? ?#Possible UTI ?#Glucosuria without diabetes ?Patient reported urinary frequency for the past several months. His urinalysis was notable for RBCs, WBCs, and few bacteria. He was unable to tell the IMTS team if he is having any other symptoms due to his confusion. On Farxiga for his heart failure - we are holding this. Patient's urine culture grew <10,000 CFU.  ?-d/c IV Ceftriaxone  ?-F/u gonorrhea chlamydia  ? ?#Cirrhosis, appears compensated ?Previously diagnosed with abdominal imaging. Mildly decreased albumin. Platelets were 88 on admission. PT/INR and aptt WNL. RUQ [place] w/o evidence of portal hypertension.  ?-Nothing to do acutely ?-Consider lactulose if encephalopathy does not improve with treatment of DT ?-Will get resources for patient regarding alcohol cessation.  ? ?Diet: CLD  ?VTE: Holding Eliquis for thrombocytopenia ?IVF: Gentle NS ?Code: Full ?  ?Prior to Admission Living Arrangement: Home ?Anticipated Discharge Location: Home ?Barriers to Discharge: agitation secondary to alcohol withdrawal ?Dispo: Anticipated discharge in approximately 1-2 day(s).  ? ?[name], Medical Student ?07/25/2021, 9:53  AM ?After 5pm on weekdays and 1pm on weekends: On Call pager [phone] ? ?Attestation for Student Documentation: ? ?I personally was present and performed or re-performed the history, physical exam and medical decision-making activities of this service and have verified that the service and findings are accurately documented in the student?s note. ? ?Rick Duff, MD ?07/25/2021, 10:44 AM ? ? ?Rick Duff, MD PGY-2 ?Internal Medicine  ? ? ? ?

## 2021-07-25 NOTE — Plan of Care (Signed)

## 2021-07-25 NOTE — Progress Notes (Signed)
MD on call notified again for HR of 131 and BP of 152/110. No new orders given, will continue to monitor. ?

## 2021-07-26 ENCOUNTER — Inpatient Hospital Stay (HOSPITAL_COMMUNITY): Payer: Medicare (Managed Care)

## 2021-07-26 MED ORDER — PANTOPRAZOLE SODIUM 40 MG PO TBEC
40.0000 mg | DELAYED_RELEASE_TABLET | Freq: Two times a day (BID) | ORAL | Status: DC
  Administered 2021-07-26 – 2021-07-28 (×4): 40 mg via ORAL
  Filled 2021-07-26 (×4): qty 1

## 2021-07-26 MED ORDER — POTASSIUM CHLORIDE 20 MEQ PO PACK
40.0000 meq | PACK | Freq: Once | ORAL | Status: AC
  Administered 2021-07-26: 40 meq via ORAL
  Filled 2021-07-26: qty 2

## 2021-07-26 MED ORDER — LORAZEPAM 1 MG PO TABS
1.0000 mg | ORAL_TABLET | Freq: Once | ORAL | Status: AC
  Administered 2021-07-26: 1 mg via ORAL
  Filled 2021-07-26: qty 1

## 2021-07-26 MED ORDER — MAGNESIUM SULFATE 2 GM/50ML IV SOLN
2.0000 g | Freq: Once | INTRAVENOUS | Status: AC
  Administered 2021-07-26: 2 g via INTRAVENOUS
  Filled 2021-07-26 (×3): qty 50

## 2021-07-26 MED ORDER — LORAZEPAM 0.5 MG PO TABS: 0.5000 mg | ORAL_TABLET | Freq: Once | ORAL | Status: DC

## 2021-07-26 NOTE — Progress Notes (Signed)
Patient HR 136, BP 157/114. Patient disoriented X4, not able to swallow medications. MD paged. Awaiting response.  [name], RN

## 2021-07-26 NOTE — Progress Notes (Signed)
Pt alert and mumbling, tried to give him his morning dose of librium, patient barely opened his mouth. Took few ships of water but did not open his mouth to take medicine. Tried for 20 min. Bilateral soft wrist Restrain remains in place, will pass it on to day shift RN.

## 2021-07-26 NOTE — Progress Notes (Signed)
Subjective:   Overnight events: No acute events overnight.  Patient seen and evaluated on rounds this morning. He was sleeping comfortably when we arrived and was arousable to voice but appeared altered and was not answering questions appropriately. When asked about pain he states "I'm in pain but I'm not in pain" and is not able to specify the location of any pain.   Objective:  Vital signs in last 24 hours: Vitals:   07/25/21 2301 07/26/21 0000 07/26/21 0332 07/26/21 0814  BP: (!) 165/116 110/80 (!) 146/114 (!) 157/114  Pulse: (!) 130 (!) 112 (!) 114 (!) 136  Resp: '19 20 20 20  '$ Temp: 97.9 F (36.6 C)  99.1 F (37.3 C) 98.8 F (37.1 C)  TempSrc: Oral  Axillary Axillary  SpO2: 100%  100% 98%  Weight:      Height:       Constitutional: Mal-nourished, in no acute distress. Patient is not oriented to person, place, or time.  Head: Normocephalic and atraumatic.  Neck: Normal range of motion.  Cardiovascular: Tachycardia with regular rhythm, intact distal pulses. No lower extremity edema  Pulmonary: Non labored breathing on room air, no wheezing or rales  Abdominal: Soft. Normal bowel sounds. Non distended Musculoskeletal: Normal range of motion. No tenderness or edema.  Neurological: Sleeping. Awakes to touch but does not open his eyes or respond verbally. Tremulous. Not alert to person, place, or time. Some of his speech is unintelligible, some words are able to be made out but often do not make sense in the context of the question. Skin: Skin is warm and dry.    Assessment/Plan:  Principal Problem:   Alcohol withdrawal ([place]) Active Problems:   AKI (acute kidney injury) ([place])   Hypokalemia  #Delirium Tremens #Acute Alcohol Withdrawal  The patient was sleeping this morning on rounds and was arousable to voice but was very disoriented with some unintelligible speech. He was not able to express whether he had any pain or other complaints. His CIWA scores have been as high  as 22 during this admission, but recently have been less than 5 over the last 48h, so we started a Librium taper to 20 mg TID. This morning he was more altered, and the nurse said that he did not take his Librium this morning. He does have some mild tremors at this time and is disoriented but is not agitated. Given his persistent altered mental status, we will check a head CT and give him a banana bag. Plan for sedation with Ativan prior to head CT. -head CT -continue CIWA -banana bag -discontinue Librium  #N/V/D #Chest pain The patient did have one reported episode of hematemesis prior to admission but has not had any witnessed episodes here in the hospital. Consider GI follow-up as an outpatient given pain with swallowing and possible hematemesis at home. Patient also was complaining of some chest pain described as indigestion on admission that was improved with GI cocktail. Troponins were 45 and 38, and EKG showed no evidence of ischemia.  -Continue symptomatic support for GI upset -Zofran as needed -PO PPI BID  -IV fluids  #AKI on CKD 2-3a Likely prerenal. Potential component of intrarenal as well given low systolic blood pressures when he was found. Patient does appear volume down on exam this AM. Continues to improve with IV fluids. sCr 1.28 from 6.6 on admission, which is approaching his baseline.  His PVR was 65cc and renal US was reassuring that there was no obstructive process.  -Continue  gentle IV fluids given patient's cardiomyopathy and low EF. -Continue to hold home Entresto and farxiga  -Restarting coreg for tachycardia, hypertension  #NAGMA Likely in the setting of patient's acute renal dysfunction. Improved with IV fluids. -Continue to monitor with daily BMPs  #Hyponatremia Likely secondary to hypovolemia. Corrected with IV fluids.  -Continue to monitor with IV fluids   #Persistent atrial flutter Patient presented in atrial flutter due to continued alcohol misuse. Now  appears sinus tachycardia and atrial flutter intermittently. The patient's Coreg was initially held due to concern for soft blood pressures initially. Given that the patient's pressures have been normal to mildly elevated recently, metoprolol Tartrate was started for his tachycardia which has improved his heart rate some, but it remains elevated, so we will resume his home Coreg. -Holding Eliquis due to reported hematemesis and thrombocytopenia, will likely resume in the AM -Coreg -Will give gentile IV fluids per above.   #Polysubstance abuse The patient endorses cocaine use approximately two times per week. He also endorses drinking 80 oz of beer every day and reports his last drink day prior to admission. Appears to be in delirium tremens per above. He states that he has had multiple instances in the past of alcohol withdrawal when he stops drinking. -Medications per above for DTs -Consult TOC for substance use  -CIWA  -Banana bag  #Possible UTI #Glucosuria without diabetes Patient reported urinary frequency for the past several months. His urinalysis was notable for RBCs, WBCs, and few bacteria. He was unable to tell the IMTS team if he is having any other symptoms due to his confusion. On Farxiga for his heart failure - we are holding this. Patient's urine culture grew <10,000 CFU.  -d/c IV Ceftriaxone  -F/u gonorrhea chlamydia   #Cirrhosis, appears compensated Previously diagnosed with abdominal imaging. Mildly decreased albumin. Platelets were 88 on admission. PT/INR and aptt WNL. RUQ [place] w/o evidence of portal hypertension.  -Nothing to do acutely -Consider lactulose if encephalopathy does not improve with treatment of DT -Will get resources for patient regarding alcohol cessation.   Diet: CLD  VTE: Holding Eliquis for thrombocytopenia IVF: Gentle NS Code: Full   Prior to Admission Living Arrangement: Home Anticipated Discharge Location: Home Barriers to Discharge: agitation  secondary to alcohol withdrawal Dispo: Anticipated discharge in approximately 1-2 day(s).   [name], Medical Student 07/26/2021, 11:44 AM After 5pm on weekdays and 1pm on weekends: On Call pager [phone]

## 2021-07-27 ENCOUNTER — Inpatient Hospital Stay (HOSPITAL_COMMUNITY): Payer: Medicare (Managed Care)

## 2021-07-27 ENCOUNTER — Encounter (HOSPITAL_COMMUNITY): Payer: Self-pay | Admitting: Internal Medicine

## 2021-07-27 DIAGNOSIS — I483 Typical atrial flutter: Secondary | ICD-10-CM | POA: Diagnosis not present

## 2021-07-27 DIAGNOSIS — I5042 Chronic combined systolic (congestive) and diastolic (congestive) heart failure: Secondary | ICD-10-CM | POA: Diagnosis not present

## 2021-07-27 LAB — CBC
HCT: 34.6 % — ABNORMAL LOW (ref 39.0–52.0)
Hemoglobin: 11.7 g/dL — ABNORMAL LOW (ref 13.0–17.0)
MCH: 31.3 pg (ref 26.0–34.0)
MCHC: 33.8 g/dL (ref 30.0–36.0)
MCV: 92.5 fL (ref 80.0–100.0)
Platelets: 169 10*3/uL (ref 150–400)
RBC: 3.74 MIL/uL — ABNORMAL LOW (ref 4.22–5.81)
RDW: 14.2 % (ref 11.5–15.5)
WBC: 8.5 10*3/uL (ref 4.0–10.5)
nRBC: 0 % (ref 0.0–0.2)

## 2021-07-27 LAB — COMPREHENSIVE METABOLIC PANEL
ALT: 12 U/L (ref 0–44)
AST: 17 U/L (ref 15–41)
Albumin: 2.6 g/dL — ABNORMAL LOW (ref 3.5–5.0)
Alkaline Phosphatase: 45 U/L (ref 38–126)
Anion gap: 6 (ref 5–15)
BUN: 18 mg/dL (ref 8–23)
CO2: 24 mmol/L (ref 22–32)
Calcium: 8.3 mg/dL — ABNORMAL LOW (ref 8.9–10.3)
Chloride: 113 mmol/L — ABNORMAL HIGH (ref 98–111)
Creatinine, Ser: 1.24 mg/dL (ref 0.61–1.24)
GFR, Estimated: >60 mL/min (ref >60)
Glucose, Bld: 111 mg/dL — ABNORMAL HIGH (ref 70–99)
Potassium: 3.9 mmol/L (ref 3.5–5.1)
Sodium: 143 mmol/L (ref 135–145)
Total Bilirubin: 0.7 mg/dL (ref 0.3–1.2)
Total Protein: 6.2 g/dL — ABNORMAL LOW (ref 6.5–8.1)

## 2021-07-27 LAB — GLUCOSE, CAPILLARY: Glucose-Capillary: 130 mg/dL — ABNORMAL HIGH (ref 70–99)

## 2021-07-27 MED ORDER — OSMOLITE 1.5 CAL PO LIQD
1000.0000 mL | ORAL | Status: DC
  Administered 2021-07-27 – 2021-07-30 (×2): 1000 mL
  Filled 2021-07-27 (×2): qty 1000

## 2021-07-27 MED ORDER — ADULT MULTIVITAMIN W/MINERALS CH
1.0000 | ORAL_TABLET | Freq: Every day | ORAL | Status: DC
  Administered 2021-07-28 – 2021-08-07 (×11): 1
  Filled 2021-07-27 (×11): qty 1

## 2021-07-27 MED ORDER — PROSOURCE TF PO LIQD
45.0000 mL | Freq: Every day | ORAL | Status: DC
  Administered 2021-07-27 – 2021-08-02 (×7): 45 mL
  Filled 2021-07-27 (×7): qty 45

## 2021-07-27 MED ORDER — ISOSORB DINITRATE-HYDRALAZINE 20-37.5 MG PO TABS
1.0000 | ORAL_TABLET | Freq: Three times a day (TID) | ORAL | Status: DC
Start: 2021-07-27 — End: 2021-07-28
  Administered 2021-07-27 – 2021-07-28 (×2): 1 via ORAL
  Filled 2021-07-27 (×2): qty 1

## 2021-07-27 MED ORDER — APIXABAN 5 MG PO TABS
5.0000 mg | ORAL_TABLET | Freq: Two times a day (BID) | ORAL | Status: DC
  Administered 2021-07-28 (×2): 5 mg via ORAL
  Filled 2021-07-27 (×2): qty 1

## 2021-07-27 MED ORDER — DAPAGLIFLOZIN PROPANEDIOL 10 MG PO TABS
10.0000 mg | ORAL_TABLET | Freq: Every day | ORAL | Status: DC
  Administered 2021-07-28 – 2021-07-30 (×2): 10 mg via ORAL
  Filled 2021-07-27 (×6): qty 1

## 2021-07-27 NOTE — Progress Notes (Addendum)
Subjective:   Overnight events: No acute events overnight.  Patient seen and evaluated on rounds this morning. He was less agitated compared to the day prior and more alert. He states that he is having some difficulty swallowing, noting that when he takes his tablets he develops some burning in his esophagus and also feels that the tablet and food get stuck occasionally. His nurse notes that he has not eaten much.    Objective:  Vital signs in last 24 hours: Vitals:   07/26/21 2333 07/27/21 0405 07/27/21 0802 07/27/21 1217  BP: 105/82 (!) 127/97 (!) 133/94 (!) 119/94  Pulse: (!) 103 100 (!) 130 (!) 124  Resp: '17 20 19 18  '$ Temp: 98.9 F (37.2 C) 98.1 F (36.7 C) 98.1 F (36.7 C) 97.9 F (36.6 C)  TempSrc: Axillary Oral Oral Axillary  SpO2: 100% 95% 99% 98%  Weight:      Height:        Constitutional: Malnourished, cachetic appearing, and in no distress.  HENT:  Head: Normocephalic and atraumatic.  Eyes: EOM are normal.  Neck: Normal range of motion.  Cardiovascular: Tachycardic rate, regularly irregular intact distal pulses. No gallop and no friction rub.  No murmur heard. No lower extremity edema  Pulmonary: Non labored breathing on room air, no wheezing or rales  Abdominal: Soft. Normal bowel sounds. Non distended and non tender Musculoskeletal: Normal range of motion.        General: No tenderness or edema.  Neurological: Alert.  Skin: Skin is warm and dry.    Assessment/Plan:  Principal Problem:   Alcohol withdrawal ([place]) Active Problems:   AKI (acute kidney injury) ([place])   Hypokalemia  #Delirium Tremens #Acute Alcohol Withdrawal  Patient more lucid this AM. Libriuim was discontinued given his mental status the day prior. CT head was negative for acute pathology. Patient able to communicate more clearly this AM. Continue with PRN ativan.  -continue CIWA  #N/V/D #Chest pain #Odynophagia/dysphagia  The patient did have one reported episode of hematemesis  prior to admission but has not had any witnessed episodes here in the hospital or other episodes of emesis. Troponins were 45 and 38, and EKG showed no evidence of acute ischemia. Patient also reports odynophagia and dysphagia. He has been unable to swallow his pills or eat because he gets a burning sensation with doing these activities. SLP was consulted and patient aspirated liquids. Plan is for cortrak.  -Continue symptomatic support for esophageal irritation -Continue cortrak per SLP and Tfs.  -Zofran as needed -PO PPI BID  -IV fluids  #AKI on CKD 2-3a Likely prerenal. Improved with IV fluids.  -Continue to hold home Entresto and farxiga  -Restarting coreg for tachycardia, hypertension  #NAGMA Likely in the setting of patient's acute renal dysfunction. Improved with IV fluids. -Continue to monitor with daily BMPs  #Hyponatremia Likely secondary to hypovolemia. Corrected with IV fluids.  -Continue to monitor with IV fluids   #Persistent atrial flutter Patient presented in atrial flutter due to continued alcohol misuse. Remains in atrial flutter. The patient's Coreg was initially held due to concern for soft blood pressures. Metoprolol tartrate '50mg'$  BID was trialed w/ minimal effect. Coreg was resumed and patient continues to have elevated heart rates.  -Resumed eliquis  -Coreg -Cardiology consulted, appreciate there assistance.   #Polysubstance abuse The patient endorses cocaine use approximately two times per week. He also endorses drinking 80 oz of beer every day and reports his last drink day prior to admission. Appears to  be in delirium tremens per above. He states that he has had multiple instances in the past of alcohol withdrawal when he stops drinking. -Medications per above for DTs -Consult TOC for substance use  -CIWA    #Possible UTI #Glucosuria without diabetes Patient reported urinary frequency for the past several months. His urinalysis was notable for RBCs, WBCs,  and few bacteria. He was unable to tell the IMTS team if he is having any other symptoms due to his confusion. On Farxiga for his heart failure - we are holding this. Patient's urine culture grew <10,000 CFU.  -d/c IV Ceftriaxone  -F/u gonorrhea chlamydia   #Cirrhosis, appears compensated Previously diagnosed with abdominal imaging. Mildly decreased albumin. Platelets were 88 on admission. PT/INR and aptt WNL. RUQ [place] w/o evidence of portal hypertension.  -Nothing to do acutely -Consider lactulose if encephalopathy does not improve with treatment of DT -Will get resources for patient regarding alcohol cessation.   Diet: NPO VTE: Resume eliquis no further episodes of hematemesis and platelets normalized.  IVF: Gentle NS Code: Full   Prior to Admission Living Arrangement: Home Anticipated Discharge Location: Home Barriers to Discharge: agitation secondary to alcohol withdrawal Dispo: Anticipated discharge in approximately 1-2 day(s).   Rick Duff, MD 07/27/2021, 2:42 PM After 5pm on weekdays and 1pm on weekends: On Call pager [phone]

## 2021-07-27 NOTE — Procedures (Signed)
Cortrak  Person Inserting Tube:  Jazminn Pomales D, RD Tube Type:  Cortrak - 43 inches Tube Size:  10 Tube Location:  Left nare Secured by: Bridle Technique Used to Measure Tube Placement:  Marking at nare/corner of mouth Cortrak Secured At:  68 cm  Cortrak Tube Team Note:  Consult received to place a Cortrak feeding tube.   X-ray is required, abdominal x-ray has been ordered by the Cortrak team. Please confirm tube placement before using the Cortrak tube.   If the tube becomes dislodged please keep the tube and contact the Cortrak team at [email or web address] ([email or web address]) for replacement.  If after hours and replacement cannot be delayed, place a NG tube and confirm placement with an abdominal x-ray.    Jeffey Janssen, RD, LDN Clinical Dietitian RD pager # available in AMION  After hours/weekend pager # available in AMION  

## 2021-07-27 NOTE — Progress Notes (Signed)
Modified Barium Swallow Progress Note  Patient Details  Name: Austin Cobb MRN: [id number] Date of Birth: 08/09/1960  Today's Date: 07/27/2021  Modified Barium Swallow completed.  Full report located under Chart Review in the Imaging Section.  Brief recommendations include the following:  Clinical Impression  Pt is deconditioned with periods of mild confusion and pharyngeal congestion and vocal quality wetness at baseline. He has what appears to be cervical osteophytes and cervical kyphosis which contribute to his dysphagia. He exhibited mild-moderate oropharyngeal dysphagia with impairments in oral cohesion, timeliness of transit and lingual residue. Swallow onset is intermittently delayed to his valleculae from 5-8 seconds before swallow triggered. Weakness of pharyngeal contraction led to pharyngeal residue in valleculae and pyriform sinues unable to clear with cues for dry swallows. Timing of laryngeal closure was delayed resulting in penetration with nectar thick. Barium seen starting to spill over interarytenoid space and as pt was at rest a large amount of residue in pyriform sinuses fell and was aspirated into airway. He had an immediate cough response and unable to determine how much was expelled. Various consistencies trialed to reduce residue however opportunity for aspiration is increased. Given amount of residue in pyriforms and forward positioning at rest, chin tuck not attempted. Pt's clinical presentation, current deconditioning and weakness lead to recommendation of NPO status at this time with alternative means. Prognosis is good for return to po's hopefully during this hospital admission. ST will continue to work with pt on oropharyngeal swallow.   Swallow Evaluation Recommendations       SLP Diet Recommendations: NPO       Medication Administration: Via alternative means               Oral Care Recommendations: Oral care QID        [name] 07/27/2021,3:37 PM

## 2021-07-27 NOTE — Consult Note (Addendum)
Cardiology Consultation:   Patient ID: Austin Cobb MRN: [id number]; DOB: 1960/08/03  Admit date: 07/26/2021 Date of Consult: 07/27/2021  PCP:  Patient, No Pcp Per (Inactive)   [place] Providers Cardiologist:  Werner Lean, MD   {  Patient Profile:   Austin Cobb is a 61 y.o. male with a hx of persistent atrial flutter chronic combined CHF secondary to nonischemic cardiomyopathy, CKD III, bipolar disorder, cirrhosis, polysubstance abuse (tobacco, cocaine, marijuana) and depression who is being seen 07/27/2021 for the evaluation of atrial flutter with rapid ventricular rate at the request of Dr. Johnnye Sima.  Cardiac history began on 05/04/2019 when he  presented to [facility] on [facility] w/complaints of new exertional dyspnea, fatigue and decreased exercise tolerance.  He was found to be markedly hypertensive.  ECHO with EF <10%, moderately reduced RV function.  Did briefly require inotropic support as cr was climbing with diuresis.  DCCV was attempted but when he was started on milrinone he went back into a flutter cardioversion was attempted again on 05/08/2019 while on amiodarone.  This was complicated by him going through alcohol withdrawal during the admission.     Had [facility] 05/2019 with low filling pressures, preserved cardiac output and minimal non obstructive CAD.  His cardiomyopathy was thought to be either due to heavy alcohol use vs tachycardia mediated from atrial flutter or a combination.    Echo 05/05/19: LVEF 10-15% Echo 05/20/2019: LVEF 25-30% Echo 12/2019: LVEF 25-30%  Last echocardiogram May 2022 with LV function less than 20%, moderate LVH, normal RV function, mild to moderate mitral regurgitation.  Last seen by Dr. Gasper Sells 11/22/2020.  At that time patient was taking [place], low-dose Entresto spironolactone, and Xarelto.  History of Present Illness:   Mr. Kawahara presented 5/13 with complaint of chest pain, nausea, vomiting, lightheadedness and abdominal  pain.  Also had a bloody vomiting.  He was noted hypotensive by EMS and given IV fluid.  Urine drug screen was positive for urine.  He is admitted for AKI with scr of 6.63 and acute alcohol withdrawal with delirium and tremors.  He was treated with IV fluid with normalization of renal function today.  Placed on CIWA protocol.  Started on Librium.   Given ongoing delirium plan to give banana bag and get CT of head.  Patient was started on metoprolol for elevated heart rate.  Seems he was on carvedilol at home.  Held Eliquis for hematemesis and thrombocytopenia.  Hemoglobin A1c 5.5 Potassium 3.9 Creatinine 1.24 Albumin 2.6 Hemoglobin 11.7 CT of head without acute intracranial abnormality  Today patient reports ongoing sharp chest pain with swallowing.  This was his main presenting symptoms.  Reported bloody vomiting.  Past Medical History:  Diagnosis Date   Anxiety    Arthritis    Bipolar 1 disorder ([place])    Depression    Bipolar, not on meds/pt. stopped meds on own over 12 yrs ago   GERD (gastroesophageal reflux disease)    History of colon polyps    Manic disorder ([place])    Noncompliance 05/14/2019   Substance abuse ([place])    17yr ago smoke crack and marjuana   Tobacco abuse 05/13/2012    Past Surgical History:  Procedure Laterality Date   CARDIOVERSION N/A 05/06/2019   Procedure: CARDIOVERSION;  Surgeon: Nahser, PWonda Cheng MD;  [facility]  Service: Cardiovascular;  Laterality: N/A;   CARDIOVERSION N/A 05/20/2019   Procedure: CARDIOVERSION;  Surgeon: MLarey Dresser MD;  [facility]  Service: Cardiovascular;  Laterality: N/A;   EXAMINATION UNDER ANESTHESIA N/A 11/12/2012   Procedure: EXAM UNDER ANESTHESIA;  Surgeon: Adin Hector, MD;  Location: [facility];  Service: General;  Laterality: N/A;   EYE SURGERY     bilateral eye sx as a child 6-20yr old.   RIGHT/LEFT HEART CATH AND CORONARY ANGIOGRAPHY N/A 05/18/2019   Procedure: RIGHT/LEFT HEART CATH AND CORONARY  ANGIOGRAPHY;  Surgeon: MLarey Dresser MD;  [facility];  Service: Cardiovascular;  Laterality: N/A;   SPHINCTEROTOMY N/A 11/12/2012   Procedure: SPHINCTEROTOMY;  Surgeon: SAdin Hector MD;  Location: [facility];  Service: General;  Laterality: N/A;   TEE WITHOUT CARDIOVERSION N/A 05/06/2019   Procedure: TRANSESOPHAGEAL ECHOCARDIOGRAM (TEE);  Surgeon: NAcie FredricksonPWonda Cheng MD;  [facility]  Service: Cardiovascular;  Laterality: N/A;   TEE WITHOUT CARDIOVERSION N/A 05/20/2019   Procedure: TRANSESOPHAGEAL ECHOCARDIOGRAM (TEE);  Surgeon: MLarey Dresser MD;  [facility];  Service: Cardiovascular;  Laterality: N/A;   TONSILLECTOMY      Inpatient Medications: Scheduled Meds:  carvedilol  25 mg Oral BID   feeding supplement  1 Container Oral TID BM   folic acid  1 mg Intravenous Daily   guaiFENesin  600 mg Oral BID   multivitamin with minerals  1 tablet Oral Daily   nicotine  14 mg Transdermal Daily   pantoprazole  40 mg Oral BID   sucralfate  1 g Oral TID WC & HS   thiamine  100 mg Oral Daily   Or   thiamine  100 mg Intravenous Daily   Continuous Infusions:  PRN Meds: acetaminophen **OR** acetaminophen, ondansetron **OR** ondansetron (ZOFRAN) IV  Allergies:    Allergies  Allergen Reactions   Adhesive [Tape] Other (See Comments)    Heat and EKG pads flare pre-existing eczema    Social History:   Social History   Socioeconomic History   Marital status: Divorced    Spouse name: Not on file   Number of children: Not on file   Years of education: Not on file   Highest education level: Not on file  Occupational History   Not on file  Tobacco Use   Smoking status: Every Day    Packs/day: 0.50    Years: 35.00    Pack years: 17.50    Types: Cigarettes   Smokeless tobacco: Never  Substance and Sexual Activity   Alcohol use: Yes    Alcohol/week: 4.0 standard drinks    Types: 4 Cans of beer per week    Comment: 4 cans of beer a day    Drug  use: Yes    Types: Cocaine    Comment: sometimes   Sexual activity: Never  Other Topics Concern   Not on file  Social History Narrative   Not on file   Social Determinants of Health   Financial Resource Strain: Low Risk    Difficulty of Paying Living Expenses: Not very hard  Food Insecurity: No Food Insecurity   Worried About RCharity fundraiserin the Last Year: Never true   Ran Out of Food in the Last Year: Never true  Transportation Needs: Unmet Transportation Needs   Lack of Transportation (Medical): Yes   Lack of Transportation (Non-Medical): Yes  Physical Activity: Not on file  Stress: Not on file  Social Connections: Not on file  Intimate Partner Violence: Not on file    Family History:   History reviewed. No pertinent family history.   ROS:  Please see  the history of present illness.  All other ROS reviewed and negative.     Physical Exam/Data:   Vitals:   07/26/21 2333 07/27/21 0405 07/27/21 0802 07/27/21 1217  BP: 105/82 (!) 127/97 (!) 133/94 (!) 119/94  Pulse: (!) 103 100 (!) 130 (!) 124  Resp: '17 20 19 18  '$ Temp: 98.9 F (37.2 C) 98.1 F (36.7 C) 98.1 F (36.7 C) 97.9 F (36.6 C)  TempSrc: Axillary Oral Oral Axillary  SpO2: 100% 95% 99% 98%  Weight:      Height:        Intake/Output Summary (Last 24 hours) at 07/27/2021 1320 Last data filed at 07/27/2021 0100 Gross per 24 hour  Intake 218 ml  Output 300 ml  Net -82 ml      07/29/2021    6:00 PM 07/17/2021    9:15 AM 11/22/2020    1:28 PM  Last 3 Weights  Weight (lbs) 132 lb 4.8 oz 150 lb 135 lb  Weight (kg) 60.01 kg 68.04 kg 61.236 kg     Body mass index is 19.54 kg/m.  General: Ill-appearing elderly male in no acute distress HEENT: normal Neck: no JVD Vascular: No carotid bruits; Distal pulses 2+ bilaterally Cardiac:  normal S1, S2; irregular tachycardic, no murmur  Lungs:  clear to auscultation bilaterally, no wheezing, rhonchi or rales  Abd: soft, nontender, no hepatomegaly  Ext: no  edema Musculoskeletal:  No deformities, BUE and BLE strength normal and equal Skin: warm and dry  Neuro:  CNs 2-12 intact, no focal abnormalities noted Psych:  Normal affect   EKG:  The EKG was personally reviewed and demonstrates: Atrial flutter'@variable'$  rate Telemetry:  Telemetry was personally reviewed and demonstrates: Atrial flutter with fluctuating heart rate between 110-130  Relevant CV Studies:  Echo 07/2020 1. Left ventricular ejection fraction, by estimation, is <20%. The left  ventricle has severely decreased function. The left ventricle demonstrates  global hypokinesis. There is moderate left ventricular hypertrophy of the  posterior segment.   2. Right ventricular systolic function is normal. The right ventricular  size is mildly enlarged.   3. Left atrial size was moderately dilated.   4. The pericardial effusion is posterior to the left ventricle.   5. The mitral valve is normal in structure. Mild to moderate mitral valve  regurgitation. No evidence of mitral stenosis.   6. The aortic valve is normal in structure. Aortic valve regurgitation is  not visualized. No aortic stenosis is present.   7. The inferior vena cava is dilated in size with <50% respiratory  variability, suggesting right atrial pressure of 15 mmHg.   Comparison(s): Prior EF 25-30%.   RIGHT/LEFT HEART CATH AND CORONARY ANGIOGRAPHY  05/2019   Conclusion  1. Low filling pressures.  2. Preserved cardiac output.  3. Nonobstructive mild coronary disease.    Nonischemic cardiomyopathy.  Will start Eliquis this evening.   Laboratory Data:  High Sensitivity Troponin:   Recent Labs  Lab 07/20/2021 0928 08/02/2021 1131  TROPONINIHS 45* 38*     Chemistry Recent Labs  Lab 07/24/21 0656 07/25/21 0658 07/27/21 0656  NA 140 142 143  K 3.5 3.7 3.9  CL 113* 114* 113*  CO2 20* 20* 24  GLUCOSE 107* 92 111*  BUN '20 20 18  '$ CREATININE 1.52* 1.28* 1.24  CALCIUM 8.0* 8.3* 8.3*  MG  --  1.7  --    GFRNONAA 51* >60 >60  ANIONGAP '7 8 6    '$ Recent Labs  Lab 07/23/21 0335 07/25/21  5784 07/27/21 0656  PROT 6.4* 6.6 6.2*  ALBUMIN 3.1* 2.9* 2.6*  AST '23 17 17  '$ ALT '16 13 12  '$ ALKPHOS 51 43 45  BILITOT 0.5 0.7 0.7   Hematology Recent Labs  Lab 07/24/21 0656 07/25/21 0658 07/27/21 0656  WBC 6.1 6.2 8.5  RBC 3.94* 4.14* 3.74*  HGB 12.3* 12.9* 11.7*  HCT 35.5* 37.7* 34.6*  MCV 90.1 91.1 92.5  MCH 31.2 31.2 31.3  MCHC 34.6 34.2 33.8  RDW 13.8 14.3 14.2  PLT 73* 96* 169   BNP Recent Labs  Lab 07/30/2021 0928  BNP 43.8    Radiology/Studies:  CT HEAD WO CONTRAST (5MM)  Result Date: 07/26/2021 CLINICAL DATA:  Mental status change, unknown cause EXAM: CT HEAD WITHOUT CONTRAST TECHNIQUE: Contiguous axial images were obtained from the base of the skull through the vertex without intravenous contrast. RADIATION DOSE REDUCTION: This exam was performed according to the departmental dose-optimization program which includes automated exposure control, adjustment of the mA and/or kV according to patient size and/or use of iterative reconstruction technique. COMPARISON:  None Available. FINDINGS: Brain: No evidence of acute infarction, hemorrhage, hydrocephalus, extra-axial collection or mass lesion/mass effect. Patchy low-density changes within the periventricular and subcortical white matter compatible with chronic microvascular ischemic change. Mild diffuse cerebral volume loss. Vascular: No hyperdense vessel or unexpected calcification. Skull: Normal. Negative for fracture or focal lesion. Sinuses/Orbits: Paranasal sinus mucosal thickening, most pronounced within the right maxillary and right sphenoid sinuses. Mastoid air cells are clear. Other: None. IMPRESSION: 1. No acute intracranial abnormality. 2. Chronic microvascular ischemic change and cerebral volume loss. 3. Paranasal sinus disease. Electronically Signed   By: Davina Poke D.O.   On: 07/26/2021 16:15     Assessment and Plan:    Persistent atrial flutter With rapid ventricular rate -Patient has longstanding history of persistent atrial flutter.  Review of telemetry shows persistent atrial fflutter with 2 morphology.  Heart rate is regular (2:1) when above 130 however when heart rate below 130s,he has variable rate with underlying rhythm of atrial flutter/fib. No sinus tachycardia. -Patient initially got metoprolol, now on carvedilol 25 mg twice daily for rate control.  Anticoagulation on hold due to hematemesis on arrival. -Carvedilol 25 mg twice daily listed on home medication however he was not on any rate control agent when last seen by Dr. Glenford Bayley September 2022. -Okay to continue Coreg '25mg'$  BID (nonselective) - Not a  good candidate for Cardizem either given low EF. - Alternatively could discussed TEE cardioversion>> however reporting pain with swallowing.  Questionable EGD need.  Hopefully he will compliant with anticoagulation following discharge if cardioversion attempted. Either way he has long standing aFlutter so will avoid.  -Resume anticoagulation when safe  2.  Chronic combined congestive heart failure -Prior cardiac catheterization showed no obstructive disease. -His cardiomyopathy felt likely due to tachycardia mediated versus alcohol induced -He was on Entresto at home but presented with AKI with creatinine of 6.6>> were hesitant to restart unless he shows compliance with medication and follow-up (previously needed to send Police for welfare check and blood work) -Resume Farxiga - Continue BB -Avoid Entresto due to severe AKI on admit. Will add Bidil (hydralazine/Imdur) for afterload reduction.   3.  Polysubstance abuse with alcohol, tobacco and cocaine -Poor insight  4.  Chest pain 5.  Hematemesis 6. Swallowing difficulty  -Reports sharp chest pain with swallowing.  Reported radiation to epigastric area.  Presented with hematemesis.  He has difficulty swallowing. -Prior cath without  evidence of  obstructive CAD -Hs-troponin 45>>38 on admit. Not consistent with ACS.  - Recommended EGD  Risk Assessment/Risk Scores:   New York Heart Association (NYHA) Functional Class NYHA Class II  CHA2DS2-VASc Score = 1   This indicates a 0.6% annual risk of stroke. The patient's score is based upon: CHF History: 1 HTN History: 0 Diabetes History: 0 Stroke History: 0 Vascular Disease History: 0 Age Score: 0 Gender Score: 0   Dr. Percival Spanish to see.   For questions or updates, please contact [place] Please consult [email or web address] for contact info under    Signed, Leanor Kail, PA  07/27/2021 1:20 PM   History and all data above reviewed.  Patient examined.  I agree with the findings as above.   The patient reports that he lives with two women who help him get his food.  He has a dilated cardiomyopathy.  It is unclear whether he takes his meds but he has a history of non adherence.  He came to the hospital with N/V and chest pain.   He reports to me that this is difficulty swallowing and that he is mostly having pain with swallowing that has forced him not to be eating.  He reports hematemesis although this has not been reported in the hospital.  He has only mild anemia.  He had AKI on admission although his creat is now improved.  The presentation was mostly thought to be related to decreased PO intake related to substance abuse.  We are called because of his atrial flutter with rapid rate.      The patient exam reveals CWC:[id number]  ,  Lungs: Decreased breath sounds   ,  Abd: Positive bowel sounds, no rebound no guarding, Ext No edema  .  All available labs, radiology testing, previous records reviewed. Agree with documented assessment and plan.   Atrial flutter:  Agree with Coreg.  I would stay away from digoxin as I am not sure whether he is taking meds correctly and this has a narrow therapeutic window.  Options are limited.  He is not a candidate for DCCV because he is  not a candidate for long term anticoagulation.    Dilated cardiomyopathy:  I would resume ARB or ARNI with his propensity toward AKI.  We will start BiDil however as his BP allows. He is not a candidate for advanced therapies.   Jeneen Rinks Joleene Burnham  2:01 PM  07/27/2021

## 2021-07-27 NOTE — Evaluation (Signed)
Clinical/Bedside Swallow Evaluation Patient Details  Name: Austin Cobb MRN: [id number] Date of Birth: [birth year]  Today's Date: 07/27/2021 Time: SLP Start Time (ACUTE ONLY): 1021 SLP Stop Time (ACUTE ONLY): 1030 SLP Time Calculation (min) (ACUTE ONLY): 9 min  Past Medical History:  Past Medical History:  Diagnosis Date   Anxiety    Arthritis    Bipolar 1 disorder ([place])    Depression    Bipolar, not on meds/pt. stopped meds on own over 12 yrs ago   GERD (gastroesophageal reflux disease)    History of colon polyps    Manic disorder ([place])    Noncompliance 05/14/2019   Substance abuse ([place])    47yr ago smoke crack and marjuana   Tobacco abuse 05/13/2012   Past Surgical History:  Past Surgical History:  Procedure Laterality Date   CARDIOVERSION N/A 05/06/2019   Procedure: CARDIOVERSION;  Surgeon: Nahser, PWonda Cheng MD;  [facility]  Service: Cardiovascular;  Laterality: N/A;   CARDIOVERSION N/A 05/20/2019   Procedure: CARDIOVERSION;  Surgeon: MLarey Dresser MD;  [facility]  Service: Cardiovascular;  Laterality: N/A;   EXAMINATION UNDER ANESTHESIA N/A 11/12/2012   Procedure: EJasmine DecemberUNDER ANESTHESIA;  Surgeon: SAdin Hector MD;  Location: [facility];  Service: General;  Laterality: N/A;   EYE SURGERY     bilateral eye sx as a child [age]yrold.   RIGHT/LEFT HEART CATH AND CORONARY ANGIOGRAPHY N/A 05/18/2019   Procedure: RIGHT/LEFT HEART CATH AND CORONARY ANGIOGRAPHY;  Surgeon: McLarey DresserMD;  [facility];  Service: Cardiovascular;  Laterality: N/A;   SPHINCTEROTOMY N/A 11/12/2012   Procedure: SPHINCTEROTOMY;  Surgeon: StAdin HectorMD;  Location: [facility];  Service: General;  Laterality: N/A;   TEE WITHOUT CARDIOVERSION N/A 05/06/2019   Procedure: TRANSESOPHAGEAL ECHOCARDIOGRAM (TEE);  Surgeon: NaAcie FredricksonhWonda ChengMD;  [facility] Service: Cardiovascular;  Laterality: N/A;   TEE WITHOUT CARDIOVERSION N/A 05/20/2019   Procedure:  TRANSESOPHAGEAL ECHOCARDIOGRAM (TEE);  Surgeon: McLarey DresserMD;  [facility];  Service: Cardiovascular;  Laterality: N/A;   TONSILLECTOMY     HPI:  Austin SELLICKs a [age].o. male with past medical history of GERD, substance abuse, combined systolic and diastolic heart failure,  HTN, atrial flutter, CKD stage IIIb, bipolar disorder, and heparin-induced thrombocytopenia who presented to the ED for evaluation for 5 days of chest pain, nausea, vomiting, lightheadedness, and fatigue. Stated to MD has pain in his esophagus with swallowing that he feels is related to his chest pain. Currentl alcohol abuse per H & P.    Assessment / Plan / Recommendation  Clinical Impression  Pt exhibited indications of airway intrusion during swallow assessment. He is confused but redirectable for commands with encouragment. Prior to po's he grimaced with saliva swallow and endorsed pain. Immediate cough consistently with thin liquids, wet vocal quality and congested cough. He needs MBS to fully evaluate and study is scheduled for today at 1330 and he is agreeable for test. Would defer lunch but if he needs meds or wants sips thin liquid that would be allowed. SLP Visit Diagnosis: Dysphagia, unspecified (R13.10)    Aspiration Risk  Mild aspiration risk;Moderate aspiration risk    Diet Recommendation Thin liquid (clears)   Liquid Administration via: Straw Medication Administration: Crushed with puree Supervision: Staff to assist with self feeding;Full supervision/cueing for compensatory strategies Compensations: Slow rate;Small sips/bites Postural Changes: Seated upright at 90 degrees;Remain upright for at least 30 minutes after po  intake    Other  Recommendations Oral Care Recommendations: Oral care BID    Recommendations for follow up therapy are one component of a multi-disciplinary discharge planning process, led by the attending physician.  Recommendations may be updated based on patient  status, additional functional criteria and insurance authorization.  Follow up Recommendations        Assistance Recommended at Discharge    Functional Status Assessment    Frequency and Duration            Prognosis        Swallow Study   General HPI: Austin Cobb is a 61 y.o. male with past medical history of GERD, substance abuse, combined systolic and diastolic heart failure,  HTN, atrial flutter, CKD stage IIIb, bipolar disorder, and heparin-induced thrombocytopenia who presented to the ED for evaluation for 5 days of chest pain, nausea, vomiting, lightheadedness, and fatigue. Stated to MD has pain in his esophagus with swallowing that he feels is related to his chest pain. Currentl alcohol abuse per H & P. Type of Study: Bedside Swallow Evaluation Previous Swallow Assessment:  (none) Diet Prior to this Study: Thin liquids;Other (Comment) (clear liquids) Temperature Spikes Noted: No Respiratory Status: Room air History of Recent Intubation: No Behavior/Cognition: Alert;Cooperative;Requires cueing Oral Cavity Assessment: Within Functional Limits Oral Care Completed by SLP: No Oral Cavity - Dentition:  (will assess during MBS) Vision: Functional for self-feeding Self-Feeding Abilities: Needs assist Patient Positioning: Upright in bed Baseline Vocal Quality: Normal Volitional Cough: Strong Volitional Swallow: Able to elicit    Oral/Motor/Sensory Function Overall Oral Motor/Sensory Function: Within functional limits   Ice Chips Ice chips: Not tested   Thin Liquid Thin Liquid: Impaired Presentation: Straw Pharyngeal  Phase Impairments: Cough - Immediate    Nectar Thick Nectar Thick Liquid: Not tested   Honey Thick Honey Thick Liquid: Not tested   Puree Puree: Not tested   Solid     Solid: Not tested      Austin Cobb 07/27/2021,10:44 AM

## 2021-07-27 NOTE — Progress Notes (Addendum)
Nutrition Follow-up  DOCUMENTATION CODES:   Severe malnutrition in context of social or environmental circumstances  INTERVENTION:   Initiate tube feeding via Cortrak: Osmolite 1.5 at 25 ml/h, increase by 10 ml every 8 hours to goal rate of 55 ml/h (1320 ml per day). Prosource TF 45 ml once daily.  Provides 2020 kcal, 94 gm protein, 1082 ml free water daily.  Monitor magnesium, potassium, and phosphorus BID for at least 3 days, MD to replete as needed, as pt is at risk for refeeding syndrome given hx alcohol abuse, severe malnutrition.  D/C Boost Breeze po TID, each supplement provides 250 kcal and 9 grams of protein.  Continue MVI with minerals daily, change to via tube.  NUTRITION DIAGNOSIS:   Severe Malnutrition related to social / environmental circumstances (alcohol use disorder) as evidenced by severe fat depletion, severe muscle depletion.  Ongoing   GOAL:   Patient will meet greater than or equal to 90% of their needs  Progressing with initiation of TF  MONITOR:   PO intake, Supplement acceptance, Diet advancement  REASON FOR ASSESSMENT:   Consult Assessment of nutrition requirement/status, Poor PO  ASSESSMENT:   61 yo male admitted with chest pain, bloody emesis. PMH includes bipolar disorder, A flutter, CKD stage IIIb, CHF, cirrhosis, HIT, alcohol use disorder.  Discussed patient with RN.  Patient is more oriented today. He is having a lot of pain when he swallows.  Patient coughing a lot during RD visit.  S/P MBS with SLP today. Patient is not currently safe for POs.  SLP recommended placing Cortrak tube for short term nutrition support. Discussed with MD, okay to place Cortrak tube and RD to order TF. Cortrak placed, awaiting x-ray confirmation of placement.   Previously on clear liquids. Meal intakes: 0%  Labs reviewed.   Medications reviewed and include folic acid, MVI with minerals, Carafate, Protonix, thiamine.  Diet Order:   Diet Order              Diet clear liquid Room service appropriate? Yes; Fluid consistency: Thin; Fluid restriction: 1800 mL Fluid  Diet effective now                   EDUCATION NEEDS:   Not appropriate for education at this time  Skin:  Skin Assessment: Reviewed RN Assessment  Last BM:  5/12  Height:   Ht Readings from Last 1 Encounters:  08/02/2021 '5\' 9"'$  (1.753 m)    Weight:   Wt Readings from Last 1 Encounters:  07/31/2021 60 kg     BMI:  Body mass index is 19.54 kg/m.  Estimated Nutritional Needs:   Kcal:  1900-2100  Protein:  90-100 gm  Fluid:  1.9-2.1 L    Lucas Mallow RD, LDN, CNSC Please refer to Amion for contact information.

## 2021-07-27 NOTE — Care Management Important Message (Signed)
Important Message  Patient Details  Name: Austin Cobb MRN: [id number] Date of Birth: 28-Mar-1960   Medicare Important Message Given:  Yes     Ezzard Ditmer Montine Circle 07/27/2021, 3:46 PM

## 2021-07-27 NOTE — Progress Notes (Signed)
Notified MD Coretrek tube was placed and imaging resulted tube terminates in proximal duodenum. Awaiting response to start feeds.  [name], RN

## 2021-07-28 ENCOUNTER — Inpatient Hospital Stay: Payer: Self-pay

## 2021-07-28 ENCOUNTER — Encounter (HOSPITAL_COMMUNITY): Payer: Self-pay | Admitting: Internal Medicine

## 2021-07-28 DIAGNOSIS — F10931 Alcohol use, unspecified with withdrawal delirium: Secondary | ICD-10-CM | POA: Diagnosis not present

## 2021-07-28 DIAGNOSIS — R579 Shock, unspecified: Secondary | ICD-10-CM

## 2021-07-28 DIAGNOSIS — N179 Acute kidney failure, unspecified: Secondary | ICD-10-CM | POA: Diagnosis not present

## 2021-07-28 DIAGNOSIS — R1319 Other dysphagia: Secondary | ICD-10-CM

## 2021-07-28 DIAGNOSIS — F17210 Nicotine dependence, cigarettes, uncomplicated: Secondary | ICD-10-CM

## 2021-07-28 DIAGNOSIS — I5021 Acute systolic (congestive) heart failure: Secondary | ICD-10-CM | POA: Diagnosis not present

## 2021-07-28 DIAGNOSIS — I4892 Unspecified atrial flutter: Secondary | ICD-10-CM

## 2021-07-28 LAB — COMPREHENSIVE METABOLIC PANEL
ALT: 11 U/L (ref 0–44)
AST: 17 U/L (ref 15–41)
Albumin: 2.7 g/dL — ABNORMAL LOW (ref 3.5–5.0)
Alkaline Phosphatase: 46 U/L (ref 38–126)
Anion gap: 9 (ref 5–15)
BUN: 20 mg/dL (ref 8–23)
CO2: 21 mmol/L — ABNORMAL LOW (ref 22–32)
Calcium: 8.3 mg/dL — ABNORMAL LOW (ref 8.9–10.3)
Chloride: 115 mmol/L — ABNORMAL HIGH (ref 98–111)
Creatinine, Ser: 1.07 mg/dL (ref 0.61–1.24)
GFR, Estimated: >60 mL/min (ref >60)
Glucose, Bld: 116 mg/dL — ABNORMAL HIGH (ref 70–99)
Potassium: 3.8 mmol/L (ref 3.5–5.1)
Sodium: 145 mmol/L (ref 135–145)
Total Bilirubin: 0.4 mg/dL (ref 0.3–1.2)
Total Protein: 6.5 g/dL (ref 6.5–8.1)

## 2021-07-28 LAB — GLUCOSE, CAPILLARY
Glucose-Capillary: 106 mg/dL — ABNORMAL HIGH (ref 70–99)
Glucose-Capillary: 110 mg/dL — ABNORMAL HIGH (ref 70–99)
Glucose-Capillary: 119 mg/dL — ABNORMAL HIGH (ref 70–99)
Glucose-Capillary: 132 mg/dL — ABNORMAL HIGH (ref 70–99)
Glucose-Capillary: 139 mg/dL — ABNORMAL HIGH (ref 70–99)
Glucose-Capillary: 140 mg/dL — ABNORMAL HIGH (ref 70–99)
Glucose-Capillary: 141 mg/dL — ABNORMAL HIGH (ref 70–99)
Glucose-Capillary: 146 mg/dL — ABNORMAL HIGH (ref 70–99)

## 2021-07-28 LAB — BRAIN NATRIURETIC PEPTIDE: B Natriuretic Peptide: 1220.8 pg/mL — ABNORMAL HIGH (ref 0.0–100.0)

## 2021-07-28 LAB — LACTIC ACID, PLASMA
Lactic Acid, Venous: 1.2 mmol/L (ref 0.5–1.9)
Lactic Acid, Venous: 1 mmol/L (ref 0.5–1.9)

## 2021-07-28 LAB — MAGNESIUM: Magnesium: 1.7 mg/dL (ref 1.7–2.4)

## 2021-07-28 LAB — PHOSPHORUS: Phosphorus: 2.5 mg/dL (ref 2.5–4.6)

## 2021-07-28 MED ORDER — CHLORHEXIDINE GLUCONATE CLOTH 2 % EX PADS
6.0000 | MEDICATED_PAD | Freq: Every day | CUTANEOUS | Status: DC
  Administered 2021-07-28 – 2021-07-30 (×3): 6 via TOPICAL

## 2021-07-28 MED ORDER — PHENOBARBITAL SODIUM 65 MG/ML IJ SOLN: 65.0000 mg | Freq: Three times a day (TID) | INTRAMUSCULAR | Status: DC

## 2021-07-28 MED ORDER — ONDANSETRON HCL 4 MG/2ML IJ SOLN: 4.0000 mg | Freq: Four times a day (QID) | INTRAMUSCULAR | Status: DC | PRN

## 2021-07-28 MED ORDER — PANTOPRAZOLE 2 MG/ML SUSPENSION
40.0000 mg | Freq: Two times a day (BID) | ORAL | Status: DC
Start: 2021-07-28 — End: 2021-07-28

## 2021-07-28 MED ORDER — PHENOBARBITAL SODIUM 130 MG/ML IJ SOLN: 97.5000 mg | Freq: Three times a day (TID) | INTRAMUSCULAR | Status: DC

## 2021-07-28 MED ORDER — METOPROLOL TARTRATE 25 MG PO TABS: 25.0000 mg | ORAL_TABLET | Freq: Four times a day (QID) | ORAL | Status: DC

## 2021-07-28 MED ORDER — THIAMINE HCL 100 MG PO TABS
100.0000 mg | ORAL_TABLET | Freq: Every day | ORAL | Status: DC
  Administered 2021-07-29 – 2021-07-30 (×2): 100 mg
  Filled 2021-07-28 (×2): qty 1

## 2021-07-28 MED ORDER — SODIUM CHLORIDE 0.9 % IV SOLN
250.0000 mL | INTRAVENOUS | Status: DC
  Administered 2021-07-28 – 2021-08-03 (×2): 250 mL via INTRAVENOUS

## 2021-07-28 MED ORDER — PHENOBARBITAL SODIUM 130 MG/ML IJ SOLN
100.0000 mg | Freq: Three times a day (TID) | INTRAMUSCULAR | Status: AC
  Administered 2021-07-28 – 2021-07-30 (×6): 100 mg via INTRAVENOUS
  Filled 2021-07-28 (×6): qty 1

## 2021-07-28 MED ORDER — PHENOBARBITAL SODIUM 65 MG/ML IJ SOLN
65.0000 mg | Freq: Three times a day (TID) | INTRAMUSCULAR | Status: AC
  Administered 2021-07-30 – 2021-08-01 (×6): 65 mg via INTRAVENOUS
  Filled 2021-07-28 (×6): qty 1

## 2021-07-28 MED ORDER — APIXABAN 5 MG PO TABS
5.0000 mg | ORAL_TABLET | Freq: Two times a day (BID) | ORAL | Status: DC
  Administered 2021-07-29 – 2021-08-07 (×19): 5 mg
  Filled 2021-07-28 (×19): qty 1

## 2021-07-28 MED ORDER — AMIODARONE LOAD VIA INFUSION
150.0000 mg | Freq: Once | INTRAVENOUS | Status: AC
Start: 2021-07-28 — End: 2021-07-28
  Administered 2021-07-28: 150 mg via INTRAVENOUS
  Filled 2021-07-28: qty 83.34

## 2021-07-28 MED ORDER — LORAZEPAM 2 MG/ML IJ SOLN
1.0000 mg | INTRAMUSCULAR | Status: DC | PRN
  Administered 2021-07-28 – 2021-08-07 (×10): 1 mg via INTRAVENOUS
  Filled 2021-07-28 (×10): qty 1

## 2021-07-28 MED ORDER — PHENOBARBITAL SODIUM 65 MG/ML IJ SOLN
32.5000 mg | Freq: Three times a day (TID) | INTRAMUSCULAR | Status: AC
  Administered 2021-08-01 – 2021-08-03 (×6): 32.5 mg via INTRAVENOUS
  Filled 2021-07-28 (×6): qty 1

## 2021-07-28 MED ORDER — GUAIFENESIN 100 MG/5ML PO LIQD
15.0000 mL | Freq: Four times a day (QID) | ORAL | Status: AC
  Administered 2021-07-29 – 2021-08-01 (×16): 15 mL
  Filled 2021-07-28 (×16): qty 15

## 2021-07-28 MED ORDER — ACETAMINOPHEN 160 MG/5ML PO SOLN
650.0000 mg | Freq: Four times a day (QID) | ORAL | Status: DC | PRN
  Administered 2021-07-29 – 2021-08-07 (×10): 650 mg
  Filled 2021-07-28 (×9): qty 20.3

## 2021-07-28 MED ORDER — ONDANSETRON HCL 4 MG PO TABS: 4.0000 mg | ORAL_TABLET | Freq: Four times a day (QID) | ORAL | Status: DC | PRN

## 2021-07-28 MED ORDER — ACETAMINOPHEN 650 MG RE SUPP
650.0000 mg | Freq: Four times a day (QID) | RECTAL | Status: DC | PRN
  Filled 2021-07-28: qty 1

## 2021-07-28 MED ORDER — NOREPINEPHRINE 4 MG/250ML-% IV SOLN
2.0000 ug/min | INTRAVENOUS | Status: DC
  Administered 2021-07-28: 2 ug/min via INTRAVENOUS
  Filled 2021-07-28: qty 250

## 2021-07-28 MED ORDER — PANTOPRAZOLE 2 MG/ML SUSPENSION
40.0000 mg | Freq: Two times a day (BID) | ORAL | Status: DC
  Administered 2021-07-28 – 2021-08-07 (×20): 40 mg
  Filled 2021-07-28 (×20): qty 20

## 2021-07-28 MED ORDER — SODIUM CHLORIDE 0.9 % IV SOLN
260.0000 mg | Freq: Once | INTRAVENOUS | Status: AC
  Administered 2021-07-28: 260 mg via INTRAVENOUS
  Filled 2021-07-28: qty 2

## 2021-07-28 MED ORDER — PHENOBARBITAL SODIUM 65 MG/ML IJ SOLN: 32.5000 mg | Freq: Three times a day (TID) | INTRAMUSCULAR | Status: DC

## 2021-07-28 MED ORDER — THIAMINE HCL 100 MG/ML IJ SOLN
100.0000 mg | Freq: Every day | INTRAMUSCULAR | Status: DC
  Filled 2021-07-28 (×2): qty 2

## 2021-07-28 MED ORDER — AMIODARONE HCL IN DEXTROSE 360-4.14 MG/200ML-% IV SOLN
60.0000 mg/h | INTRAVENOUS | Status: DC
  Administered 2021-07-28 (×2): 60 mg/h via INTRAVENOUS
  Filled 2021-07-28 (×2): qty 200

## 2021-07-28 MED ORDER — SUCRALFATE 1 GM/10ML PO SUSP
1.0000 g | Freq: Three times a day (TID) | ORAL | Status: DC
  Administered 2021-07-28 – 2021-08-07 (×38): 1 g
  Filled 2021-07-28 (×42): qty 10

## 2021-07-28 MED ORDER — AMIODARONE HCL IN DEXTROSE 360-4.14 MG/200ML-% IV SOLN
30.0000 mg/h | INTRAVENOUS | Status: DC
Start: 2021-07-28 — End: 2021-08-04
  Administered 2021-07-29 – 2021-08-04 (×10): 30 mg/h via INTRAVENOUS
  Filled 2021-07-28 (×14): qty 200

## 2021-07-28 NOTE — Progress Notes (Signed)
An USGPIV (ultrasound guided PIV) has been placed for short-term vasopressor infusion. A correctly placed ivWatch must be used when administering Vasopressors. Should this treatment be needed beyond 72 hours, central line access should be obtained.  It will be the responsibility of the bedside nurse to follow best practice to prevent extravasations.  Area marked where tip of catheter is.

## 2021-07-28 NOTE — Progress Notes (Signed)
[facility] Progress Note Patient Name: Austin Cobb DOB: 1960-08-13 MRN: [id number]   Date of Service  07/28/2021  HPI/Events of Note  Patient was previously agitated but is now resting peacefully, per bedside RN he needs an order for PRN nasotracheal suctioning, as he is having difficulty mobilizing his secretions.  eICU Interventions  Order for NT suctioning PRN entered.        Frederik Pear 07/28/2021, 9:41 PM

## 2021-07-28 NOTE — Progress Notes (Addendum)
Spoke with primary RN for this patient regarding PICC placement. Informed RN PICC will be placed 07/29/21. Pt has 2 functional PIV sites at this time. RN agreeable.

## 2021-07-28 NOTE — Progress Notes (Signed)
Progress Note  Patient Name: SAVA PROBY Date of Encounter: 07/28/2021  [facility] Cardiologist: Werner Lean, MD   Subjective   "I want to go to the ICU now."  Inpatient Medications    Scheduled Meds:  apixaban  5 mg Oral BID   carvedilol  25 mg Oral BID   dapagliflozin propanediol  10 mg Oral Daily   feeding supplement (PROSource TF)  45 mL Per Tube Daily   folic acid  1 mg Intravenous Daily   guaiFENesin  600 mg Oral BID   isosorbide-hydrALAZINE  1 tablet Oral TID   multivitamin with minerals  1 tablet Per Tube Daily   nicotine  14 mg Transdermal Daily   pantoprazole  40 mg Oral BID   sucralfate  1 g Oral TID WC & HS   thiamine  100 mg Oral Daily   Or   thiamine  100 mg Intravenous Daily   Continuous Infusions:  feeding supplement (OSMOLITE 1.5 CAL) 35 mL/hr at 07/28/21 0244   PRN Meds: acetaminophen **OR** acetaminophen, ondansetron **OR** ondansetron (ZOFRAN) IV   Vital Signs    Vitals:   07/28/21 0200 07/28/21 0308 07/28/21 0339 07/28/21 0816  BP: 108/81 119/84  (!) 132/104  Pulse:  (!) 135  (!) 138  Resp: '15 14  18  '$ Temp:  98.2 F (36.8 C)  98 F (36.7 C)  TempSrc:  Oral  Oral  SpO2:  96%  99%  Weight:   57.8 kg   Height:        Intake/Output Summary (Last 24 hours) at 07/28/2021 1049 Last data filed at 07/28/2021 0816 Gross per 24 hour  Intake 0 ml  Output 400 ml  Net -400 ml   Net neg 1.3 L       07/28/2021    3:39 AM 07/12/2021    6:00 PM 07/30/2021    9:15 AM  Last 3 Weights  Weight (lbs) 127 lb 6.8 oz 132 lb 4.8 oz 150 lb  Weight (kg) 57.8 kg 60.01 kg 68.04 kg      Telemetry    Atrial flutter with a RVR - Personally Reviewed  ECG    Atrial flutter with a RVR - Personally Reviewed  Physical Exam  BP 70/40 by me GEN: chronically ill appearing, No acute distress.   Neck: No JVD Cardiac: Reg tachy, no murmurs, rubs, or gallops.  Respiratory: Clear to auscultation bilaterally. GI: Soft, nontender,  non-distended  MS: No edema; No deformity. Neuro:  Nonfocal  Psych: Normal affect   Labs    High Sensitivity Troponin:   Recent Labs  Lab 07/09/2021 0928 08/06/2021 1131  TROPONINIHS 45* 38*     Chemistry Recent Labs  Lab 07/25/21 0658 07/27/21 0656 07/28/21 0620  NA 142 143 145  K 3.7 3.9 3.8  CL 114* 113* 115*  CO2 20* 24 21*  GLUCOSE 92 111* 116*  BUN '20 18 20  '$ CREATININE 1.28* 1.24 1.07  CALCIUM 8.3* 8.3* 8.3*  MG 1.7  --  1.7  PROT 6.6 6.2* 6.5  ALBUMIN 2.9* 2.6* 2.7*  AST '17 17 17  '$ ALT '13 12 11  '$ ALKPHOS 43 45 46  BILITOT 0.7 0.7 0.4  GFRNONAA >60 >60 >60  ANIONGAP '8 6 9    '$ Lipids No results for input(s): CHOL, TRIG, HDL, LABVLDL, LDLCALC, CHOLHDL in the last 168 hours.  Hematology Recent Labs  Lab 07/24/21 0656 07/25/21 0658 07/27/21 0656  WBC 6.1 6.2 8.5  RBC 3.94* 4.14* 3.74*  HGB 12.3* 12.9* 11.7*  HCT 35.5* 37.7* 34.6*  MCV 90.1 91.1 92.5  MCH 31.2 31.2 31.3  MCHC 34.6 34.2 33.8  RDW 13.8 14.3 14.2  PLT 73* 96* 169   Thyroid No results for input(s): TSH, FREET4 in the last 168 hours.  BNPNo results for input(s): BNP, PROBNP in the last 168 hours.  DDimer No results for input(s): DDIMER in the last 168 hours.   Radiology    CT HEAD WO CONTRAST (5MM)  Result Date: 07/26/2021 CLINICAL DATA:  Mental status change, unknown cause EXAM: CT HEAD WITHOUT CONTRAST TECHNIQUE: Contiguous axial images were obtained from the base of the skull through the vertex without intravenous contrast. RADIATION DOSE REDUCTION: This exam was performed according to the departmental dose-optimization program which includes automated exposure control, adjustment of the mA and/or kV according to patient size and/or use of iterative reconstruction technique. COMPARISON:  None Available. FINDINGS: Brain: No evidence of acute infarction, hemorrhage, hydrocephalus, extra-axial collection or mass lesion/mass effect. Patchy low-density changes within the periventricular and  subcortical white matter compatible with chronic microvascular ischemic change. Mild diffuse cerebral volume loss. Vascular: No hyperdense vessel or unexpected calcification. Skull: Normal. Negative for fracture or focal lesion. Sinuses/Orbits: Paranasal sinus mucosal thickening, most pronounced within the right maxillary and right sphenoid sinuses. Mastoid air cells are clear. Other: None. IMPRESSION: 1. No acute intracranial abnormality. 2. Chronic microvascular ischemic change and cerebral volume loss. 3. Paranasal sinus disease. Electronically Signed   By: Davina Poke D.O.   On: 07/26/2021 16:15   DG Abd Portable 1V  Result Date: 07/27/2021 CLINICAL DATA:  NG tube placement EXAM: PORTABLE ABDOMEN - 1 VIEW COMPARISON:  None Available. FINDINGS: Limited radiograph of the lower chest and upper abdomen was obtained for the purposes of enteric tube localization. Enteric tube is seen coursing below the diaphragm with distal tip terminating within the proximal duodenum. Enteric contrast is seen throughout the small bowel. Bowel gas pattern is nonobstructive. IMPRESSION: Enteric tube terminates within the proximal duodenum. Electronically Signed   By: Davina Poke D.O.   On: 07/27/2021 16:43   DG Swallowing Func-Speech Pathology  Result Date: 07/27/2021 Table formatting from the original result was not included. Objective Swallowing Evaluation: Type of Study: MBS-Modified Barium Swallow Study  Patient Details Name: LAZARO ISENHOWER MRN: [id number] Date of Birth: 1962/06/62 Today's Date: 07/27/2021 Time: SLP Start Time (ACUTE ONLY): 1350 -SLP Stop Time (ACUTE ONLY): 1404 SLP Time Calculation (min) (ACUTE ONLY): 14 min Past Medical History: Past Medical History: Diagnosis Date  Anxiety   Arthritis   Bipolar 1 disorder ([place])   Depression   Bipolar, not on meds/pt. stopped meds on own over 12 yrs ago  GERD (gastroesophageal reflux disease)   History of colon polyps   Noncompliance 05/14/2019  Substance abuse  ([place])   61yr ago smoke crack and marjuana  Tobacco abuse 05/13/2012 Past Surgical History: Past Surgical History: Procedure Laterality Date  CARDIOVERSION N/A 05/06/2019  Procedure: CARDIOVERSION;  Surgeon: Nahser, PWonda Cheng MD;  [place] [facility]  Service: Cardiovascular;  Laterality: N/A;  CARDIOVERSION N/A 05/20/2019  Procedure: CARDIOVERSION;  Surgeon: MLarey Dresser MD;  [place] [facility] [place]  Service: Cardiovascular;  Laterality: N/A;  EXAMINATION UNDER ANESTHESIA N/A 11/12/2012  Procedure: EJasmine DecemberUNDER ANESTHESIA;  Surgeon: SAdin Hector MD;  Location: [facility];  Service: General;  Laterality: N/A;  EYE SURGERY    bilateral eye sx as a child [id number].  RIGHT/LEFT HEART CATH AND CORONARY ANGIOGRAPHY N/A 05/18/2019  Procedure:  RIGHT/LEFT HEART CATH AND CORONARY ANGIOGRAPHY;  Surgeon: Larey Dresser, MD;  Location: [facility];  Service: Cardiovascular;  Laterality: N/A;  SPHINCTEROTOMY N/A 11/12/2012  Procedure: SPHINCTEROTOMY;  Surgeon: Adin Hector, MD;  Location: [facility];  Service: General;  Laterality: N/A;  TEE WITHOUT CARDIOVERSION N/A 05/06/2019  Procedure: TRANSESOPHAGEAL ECHOCARDIOGRAM (TEE);  Surgeon: Acie Fredrickson Wonda Cheng, MD;  Location: [facility];  Service: Cardiovascular;  Laterality: N/A;  TEE WITHOUT CARDIOVERSION N/A 05/20/2019  Procedure: TRANSESOPHAGEAL ECHOCARDIOGRAM (TEE);  Surgeon: Larey Dresser, MD;  Location: [facility];  Service: Cardiovascular;  Laterality: N/A;  TONSILLECTOMY   HPI: AIDENN SKELLENGER is a 61 y.o. male with past medical history of GERD, substance abuse, combined systolic and diastolic heart failure,  HTN, atrial flutter, CKD stage IIIb, bipolar disorder, and heparin-induced thrombocytopenia who presented to the ED for evaluation for 5 days of chest pain, nausea, vomiting, lightheadedness, and fatigue. Stated to MD has pain in his esophagus with swallowing that he feels is related to his chest pain. Currentl alcohol abuse per H & P.  No data recorded   Recommendations for follow up therapy are one component of a multi-disciplinary discharge planning process, led by the attending physician.  Recommendations may be updated based on patient status, additional functional criteria and insurance authorization. Assessment / Plan / Recommendation   07/27/2021   2:58 PM Clinical Impressions Clinical Impression Pt is deconditioned with periods of mild confusion and pharyngeal congestion and vocal quality wetness at baseline. He has what appears to be cervical osteophytes and cervical kyphosis which contribute to his dysphagia. He exhibited mild-moderate oropharyngeal dysphagia with impairments in oral cohesion, timeliness of transit and lingual residue. Swallow onset is intermittently delayed to his valleculae from 5-8 seconds before swallow triggered. Weakness of pharyngeal contraction led to pharyngeal residue in valleculae and pyriform sinues unable to clear with cues for dry swallows. Timing of laryngeal closure was delayed resulting in penetration with nectar thick. Barium seen starting to spill over interarytenoid space and as pt was at rest a large amount of residue in pyriform sinuses fell and was aspirated into airway. He had an immediate cough response and unable to determine how much was expelled. Various consistencies trialed to reduce residue however opportunity for aspiration is increased. Given amount of residue in pyriforms and forward positioning at rest, chin tuck not attempted. Pt's clinical presentation, current deconditioning and weakness lead to recommendation of NPO status at this time with alternative means. Prognosis is good for return to po's hopefully during this hospital admission. ST will continue to work with pt on oropharyngeal swallow. SLP Visit Diagnosis Dysphagia, oropharyngeal phase (R13.12) Impact on safety and function Moderate aspiration risk     07/27/2021   2:58 PM Treatment Recommendations Treatment Recommendations Therapy as outlined  in treatment plan below     07/27/2021   2:58 PM Prognosis Prognosis for Safe Diet Advancement Good Barriers to Reach Goals Cognitive deficits   07/27/2021   2:58 PM Diet Recommendations SLP Diet Recommendations NPO Medication Administration Via alternative means     07/27/2021   2:58 PM Other Recommendations Oral Care Recommendations Oral care QID Follow Up Recommendations -- Assistance recommended at discharge Frequent or constant Supervision/Assistance Functional Status Assessment Patient has had a recent decline in their functional status and demonstrates the ability to make significant improvements in function in a reasonable and predictable amount of time.   07/27/2021   2:58 PM Frequency and Duration  Speech Therapy Frequency (ACUTE ONLY) min 2x/week  Treatment Duration 2 weeks     07/27/2021   2:58 PM Oral Phase Oral Phase Impaired Oral - Nectar Teaspoon Delayed oral transit Oral - Nectar Cup Lingual/palatal residue;Delayed oral transit;Weak lingual manipulation;Reduced posterior propulsion Oral - Nectar Straw Lingual/palatal residue;Delayed oral transit;Weak lingual manipulation;Reduced posterior propulsion Oral - Thin Straw Delayed oral transit Oral - Puree Delayed oral transit Oral - Regular Weak lingual manipulation;Delayed oral transit    07/27/2021   2:58 PM Pharyngeal Phase Pharyngeal Phase Impaired Pharyngeal- Nectar Teaspoon Pharyngeal residue - valleculae;Reduced pharyngeal peristalsis;Reduced tongue base retraction Pharyngeal- Nectar Cup Delayed swallow initiation-vallecula;Pharyngeal residue - valleculae;Pharyngeal residue - pyriform;Inter-arytenoid space residue Pharyngeal- Nectar Straw Delayed swallow initiation-vallecula;Pharyngeal residue - pyriform;Pharyngeal residue - valleculae;Penetration/Apiration after swallow Pharyngeal Material enters airway, passes BELOW cords and not ejected out despite cough attempt by patient Pharyngeal- Thin Straw Pharyngeal residue - pyriform;Pharyngeal residue -  valleculae Pharyngeal- Puree Pharyngeal residue - valleculae;Reduced pharyngeal peristalsis Pharyngeal- Regular Pharyngeal residue - valleculae    07/27/2021   2:58 PM Cervical Esophageal Phase  Cervical Esophageal Phase WFL [name] 07/27/2021, 3:37 PM                      Cardiac Studies   Echo 07/2020 1. Left ventricular ejection fraction, by estimation, is <20%. The left  ventricle has severely decreased function. The left ventricle demonstrates  global hypokinesis. There is moderate left ventricular hypertrophy of the  posterior segment.   2. Right ventricular systolic function is normal. The right ventricular  size is mildly enlarged.   3. Left atrial size was moderately dilated.   4. The pericardial effusion is posterior to the left ventricle.   5. The mitral valve is normal in structure. Mild to moderate mitral valve  regurgitation. No evidence of mitral stenosis.   6. The aortic valve is normal in structure. Aortic valve regurgitation is  not visualized. No aortic stenosis is present.   7. The inferior vena cava is dilated in size with <50% respiratory  variability, suggesting right atrial pressure of 15 mmHg.   Comparison(s): Prior EF 25-30%.    RIGHT/LEFT HEART CATH AND CORONARY ANGIOGRAPHY  05/2019    Conclusion   1. Low filling pressures.  2. Preserved cardiac output.  3. Nonobstructive mild coronary disease.    Nonischemic cardiomyopathy.  Will start Eliquis this evening.    Patient Profile     62 y.o. male admitted with poly substance abuse and acute on chronic systolic heart failure and uncontrolled atrial flutter and worsening renal failure.   Assessment & Plan    Persistent atrial flutter With rapid ventricular rate -Patient has longstanding history of persistent atrial flutter.  rate control will be difficult. I think that the flutter is contributing but not the cause of his acute decompensation 2.  Shock - his bp is currently 70/40 and he is cool.  I will transfer him to the ICU for initiation of pressors. Hold all bp lowering meds. 3.  Polysubstance abuse with alcohol, tobacco and cocaine -Poor insight  4.  Chest pain - this is related to xrt to the epigastic area.  5.  Hematemesis 6. Swallowing difficulty  -Reports sharp chest pain with swallowing.  Reported radiation to epigastric area.  Presented with hematemesis.  He has difficulty swallowing. -Prior cath without evidence of obstructive CAD -Hs-troponin 45>>38 on admit. Not consistent with ACS.  - Recommended EGD   For questions or updates, please contact [place] Please consult [email or web address] for contact info under  Signed, Dorris Carnes, MD  07/28/2021, 10:49 AM

## 2021-07-28 NOTE — Progress Notes (Signed)
Subjective:   Overnight events: No acute events overnight.  Patient seen and evaluated on rounds this morning. He is less agitated. He is not in any pain, but is concerned about drinking water. He was evaluated by speech therapy was made NPO, cortrak was placed and Tfs were started.   He has not had any issues with this.   Objective:  Vital signs in last 24 hours: Vitals:   07/28/21 1101 07/28/21 1103 07/28/21 1200 07/28/21 1216  BP: (!) 75/65 (!) 67/54 105/76 103/74  Pulse:  (!) 131    Resp: 17 18 (!) 29 19  Temp:      TempSrc:      SpO2:  98%    Weight:      Height:        Constitutional: Mal-nourished, and in no distress sitting at bedside HENT:  Head: Normocephalic and atraumatic.  Eyes: EOM are normal.  Neck: Normal range of motion.  Cardiovascular: Tachycardic rate, regular rhythm, intact distal pulses. No gallop and no friction rub.  No murmur heard. No lower extremity edema  Pulmonary: Non labored breathing on room air, no wheezing or rales  Abdominal: Soft. Normal bowel sounds. Non distended and non tender Musculoskeletal: Normal range of motion.        General: No tenderness or edema.  Neurological: Alert and oriented to person, place, and time. Non focal  Skin: Skin is warm and dry.     Assessment/Plan:  Principal Problem:   Alcohol withdrawal ([place]) Active Problems:   AKI (acute kidney injury) ([place])   Hypokalemia  #Delirium Tremens #Acute Alcohol Withdrawal  Patient's mentation appears to be back at baseline. He is off of libirum and has not received any ativan. He was also not in restraints.  -continue CIWA w/o ativan  #N/V/D #Chest pain #Odynophagia/dysphagia  The patient did have one reported episode of hematemesis prior to admission but has not had any witnessed episodes here in the hospital or other episodes of emesis. Troponins were 45 and 38, and EKG had no evidence of acute ischemia. Patient also reports odynophagia and dysphagia. He has  been unable to swallow his pills or eat because he gets a burning sensation with doing these activities. SLP was consulted and patient aspirated various consistencies of liquids. Cortrak was placed and Tfs started.  -Continue symptomatic support for esophageal irritation -Continue cortrak per SLP and Tfs.  -Will need esophagram and likely EGD  -Zofran as needed -PO PPI BID  -IV fluids  #AKI on CKD 2-3a Likely prerenal. Improved with IV fluids.  -Continue to hold home Entresto and farxiga  -Plan was to initiate metoprolol, per cardiology note later this AM patient was hypotensive and started on vasopressors   #NAGMA Likely in the setting of patient's acute renal dysfunction. Improved with IV fluids. Labs were pending this AM  -Continue to monitor with daily BMPs  #Hyponatremia Likely secondary to hypovolemia. Corrected with IV fluids.  -Continue to monitor with IV fluids   #Persistent atrial flutter Patient presented in atrial flutter due to continued alcohol misuse. Remains in atrial flutter. The patient's Coreg was initially held due to concern for soft blood pressures. Metoprolol tartrate '50mg'$  BID was trialed w/ minimal effect. Coreg was resumed and patient continues to have elevated heart rates in the 140s at bedside. His blood pressures were stable. Appears to have become hypotensive after rounds.  -on eliquis for [facility]  -Coreg->transitioned to metoprolol but stopped d/t hypotension  -May need cardioversion now that he has  hypotension  -Cardiology consulted, appreciate there assistance.   #Polysubstance abuse The patient endorses cocaine use approximately two times per week. He also endorses drinking 80 oz of beer every day and reports his last drink day prior to admission. Appears to be in delirium tremens per above. He states that he has had multiple instances in the past of alcohol withdrawal when he stops drinking. -Medications per above for DTs -Consult TOC for substance use   -CIWA    #Possible UTI #Glucosuria without diabetes Patient reported urinary frequency for the past several months. His urinalysis was notable for RBCs, WBCs, and few bacteria. He was unable to tell the IMTS team if he is having any other symptoms due to his confusion. On Farxiga for his heart failure - we are holding this. Patient's urine culture grew <10,000 CFU.  -d/c IV Ceftriaxone  -F/u gonorrhea chlamydia   #Cirrhosis, appears compensated Previously diagnosed with abdominal imaging. Mildly decreased albumin. Platelets were 88 on admission. PT/INR and aptt WNL. RUQ [place] w/o evidence of portal hypertension.  -Nothing to do acutely -Consider lactulose if encephalopathy does not improve with treatment of DT -Will get resources for patient regarding alcohol cessation.   Diet: NPO VTE: Resume eliquis no further episodes of hematemesis and platelets normalized.  IVF: Tfs when off of vasopressors  Code: Full   Prior to Admission Living Arrangement: Home Anticipated Discharge Location: Home Barriers to Discharge: Safety  Dispo: Anticipated discharge in approximately 1-2 day(s).   Rick Duff, MD 07/28/2021, 12:37 PM After 5pm on weekdays and 1pm on weekends: On Call pager [phone]

## 2021-07-28 NOTE — Consult Note (Signed)
NAME:  Austin Cobb, MRN:  [id number], DOB:  [birth year], LOS: 6 ADMISSION DATE:  07/13/2021, CONSULTATION DATE:  07/28/21 REFERRING MD:  Pasty Arch, CHIEF COMPLAINT:  cardiogenic shock   History of Present Illness:  Austin Cobb is a 61 y/o gentleman with a history of ETOH & tobacco abuse, HFrEF, CKD 3b, bipolar disorder who presented on 5/13 with CP, lightheadedness, n/v. He also has odynophagia with pain in his chest after swallowing. He had an episode of hematemesis the night before presentation.  He was found to have Aflutter with RVR and has been managed with beta blockers. Not felt to be a good candidate previously for DCCV due to esophageal pain and questionable compliance after discharge with DOAC. He underwent multiple cardioversions in 2019. He has been taken off Entresto due to AKI. He developed hypotension on the floor today and he was moved to the ICU due to concern for need for vasopressors for decompensated heart failure.    He drinks 2 x 40 oz beers per day usually, more the few days PTA. Last drink 24 hrs before admission.  He was initially treated with a  librium taper + CIWA but has not needed benzodiazepines recentl.y  [name] is flustered but denies acute complaints.   Pertinent  Medical History  ETOH abuse HFrEF Bipolar disorder GERD Tobacco abuse GERD  Significant Hospital Events: Including procedures, antibiotic start and stop dates in addition to other pertinent events   5/13 admitted 5/20 moved to ICU  Interim History / Subjective:    Objective   Blood pressure 105/74, pulse (!) 139, temperature 97.8 F (36.6 C), temperature source Oral, resp. rate 17, height '5\' 9"'$  (1.753 m), weight 57.8 kg, SpO2 97 %.        Intake/Output Summary (Last 24 hours) at 07/28/2021 1440 Last data filed at 07/28/2021 1300 Gross per 24 hour  Intake 88.15 ml  Output 400 ml  Net -311.85 ml   Filed Weights   07/13/2021 0915 08/08/2021 1800 07/28/21 0339  Weight: 68 kg  60 kg 57.8 kg    Examination: General: chronically ill, cachectic appearing man lying in bed in NAD HENT: [place]/AT, eyes anicteric Lungs: CTAB anteriorly, breathing comfortably on RA Cardiovascular: S1S2, tachycardic, reg rhythm Abdomen: thin, soft, NT Extremities: minimal muscle mass, no significant pitting edema Neuro: Awake, moving extremities but globally weak. Would not cooperate with exam to assess for asterixis. Not oriented to place or time. Psych: agitated GU: external catheter in place  Resolved Hospital Problem list     Assessment & Plan:  Acute on chronic HFrEF due to tachyarrhythmia- A-flutter with RVR. -transferred to ICU, currently has been weaned off norepinephrine. Can resume to maintain MAP >65 -hold coreg -con't farxiga; when  -start amiodarone for rate control; may end up chemically cardioverting. -con't DOAC -monitor on tele -Too uncooperative for central line currently; hopefully can get PICC to check CVPs and coox -check lactic acid, BNP -check electrolytes and replete PRN  ETOH withdrawal with Dts, acute metabolic encephalopathy. -phenobarb taper; if he remains on this past a few days may need to switch DOAC to heparin or lovenox. -CIWA -vitamins  Moderate protein energy malnutrition -TF via cortrak -vitamins  AKI on CKD 3b -ok for PICC with improved renal function  Tobacco abuse -nicotine patch -will counsel on the importance of quitting when appropriate  GERD, possibly esophagitis -PPI -carafate  Best Practice (right click and "Reselect all SmartList Selections" daily)   Diet/type: tubefeeds DVT prophylaxis: DOAC  GI prophylaxis: PPI Lines: N/A Foley:  N/A Code Status:  full code Last date of multidisciplinary goals of care discussion '[ ]'$   Labs   CBC: Recent Labs  Lab 07/22/21 0319 07/23/21 0335 07/24/21 0656 07/25/21 0658 07/27/21 0656  WBC 6.7 5.1 6.1 6.2 8.5  NEUTROABS  --  3.8  --   --   --   HGB 12.5* 12.3* 12.3* 12.9*  11.7*  HCT 37.0* 35.4* 35.5* 37.7* 34.6*  MCV 90.0 88.9 90.1 91.1 92.5  PLT 69* 72* 73* 96* 703    Basic Metabolic Panel: Recent Labs  Lab 07/23/21 0335 07/24/21 0656 07/25/21 0658 07/27/21 0656 07/28/21 0620  NA 138 140 142 143 145  K 3.4* 3.5 3.7 3.9 3.8  CL 112* 113* 114* 113* 115*  CO2 18* 20* 20* 24 21*  GLUCOSE 102* 107* 92 111* 116*  BUN 29* '20 20 18 20  '$ CREATININE 1.45* 1.52* 1.28* 1.24 1.07  CALCIUM 8.0* 8.0* 8.3* 8.3* 8.3*  MG  --   --  1.7  --  1.7  PHOS  --   --   --   --  2.5   GFR: Estimated Creatinine Clearance: 58.5 mL/min (by C-G formula based on SCr of 1.07 mg/dL). Recent Labs  Lab 07/23/21 0335 07/24/21 0656 07/25/21 0658 07/27/21 0656  WBC 5.1 6.1 6.2 8.5    Liver Function Tests: Recent Labs  Lab 07/22/21 0319 07/23/21 0335 07/25/21 0658 07/27/21 0656 07/28/21 0620  AST '22 23 17 17 17  '$ ALT '18 16 13 12 11  '$ ALKPHOS 48 51 43 45 46  BILITOT 0.3 0.5 0.7 0.7 0.4  PROT 6.0* 6.4* 6.6 6.2* 6.5  ALBUMIN 3.0* 3.1* 2.9* 2.6* 2.7*   No results for input(s): LIPASE, AMYLASE in the last 168 hours. No results for input(s): AMMONIA in the last 168 hours.  ABG    Component Value Date/Time   HCO3 27.0 05/18/2019 1321   TCO2 28 05/18/2019 1321   O2SAT 76.0 05/18/2019 1321     Coagulation Profile: Recent Labs  Lab 07/13/2021 1543  INR 1.1    Cardiac Enzymes: No results for input(s): CKTOTAL, CKMB, CKMBINDEX, TROPONINI in the last 168 hours.  HbA1C: Hgb A1c MFr Bld  Date/Time Value Ref Range Status  08/06/2021 03:43 PM 5.5 4.8 - 5.6 % Final    Comment:    (NOTE) Pre diabetes:          5.7%-6.4%  Diabetes:              >6.4%  Glycemic control for   <7.0% adults with diabetes   05/05/2019 10:06 AM 5.3 4.8 - 5.6 % Final    Comment:    (NOTE) Pre diabetes:          5.7%-6.4% Diabetes:              >6.4% Glycemic control for   <7.0% adults with diabetes     CBG: Recent Labs  Lab 07/28/21 0005 07/28/21 0337 07/28/21 0821  07/28/21 1123 07/28/21 1300  GLUCAP 146* 140* 119* 110* 106*    Review of Systems:   Limited due to encephalopathy  Past Medical History:  He,  has a past medical history of Anxiety, Arthritis, Bipolar 1 disorder ([place]), Depression, GERD (gastroesophageal reflux disease), History of colon polyps, Noncompliance (05/14/2019), Substance abuse ([place]), and Tobacco abuse (05/13/2012).   Surgical History:   Past Surgical History:  Procedure Laterality Date   CARDIOVERSION N/A 05/06/2019   Procedure: CARDIOVERSION;  Surgeon: Acie Fredrickson,  Wonda Cheng, MD;  Location: [facility] ENDOSCOPY;  Service: Cardiovascular;  Laterality: N/A;   CARDIOVERSION N/A 05/20/2019   Procedure: CARDIOVERSION;  Surgeon: Larey Dresser, MD;  Location: [facility] ENDOSCOPY;  Service: Cardiovascular;  Laterality: N/A;   EXAMINATION UNDER ANESTHESIA N/A 11/12/2012   Procedure: EXAM UNDER ANESTHESIA;  Surgeon: Adin Hector, MD;  Location: [facility] ORS;  Service: General;  Laterality: N/A;   EYE SURGERY     bilateral eye sx as a child 6-66yr old.   RIGHT/LEFT HEART CATH AND CORONARY ANGIOGRAPHY N/A 05/18/2019   Procedure: RIGHT/LEFT HEART CATH AND CORONARY ANGIOGRAPHY;  Surgeon: MLarey Dresser MD;  [facility];  Service: Cardiovascular;  Laterality: N/A;   SPHINCTEROTOMY N/A 11/12/2012   Procedure: SPHINCTEROTOMY;  Surgeon: SAdin Hector MD;  Location: [facility] ORS;  Service: General;  Laterality: N/A;   TEE WITHOUT CARDIOVERSION N/A 05/06/2019   Procedure: TRANSESOPHAGEAL ECHOCARDIOGRAM (TEE);  Surgeon: NAcie FredricksonPWonda Cheng MD;  [facility]  Service: Cardiovascular;  Laterality: N/A;   TEE WITHOUT CARDIOVERSION N/A 05/20/2019   Procedure: TRANSESOPHAGEAL ECHOCARDIOGRAM (TEE);  Surgeon: MLarey Dresser MD;  [facility];  Service: Cardiovascular;  Laterality: N/A;   TONSILLECTOMY       Social History:   reports that he has been smoking cigarettes. He has a 17.50 pack-year smoking history. He has never used smokeless  tobacco. He reports current alcohol use of about 4.0 standard drinks per week. He reports current drug use. Drug: Cocaine.   Family History:  His family history is not on file.   Allergies Allergies  Allergen Reactions   Adhesive [Tape] Other (See Comments)    Heat and EKG pads flare pre-existing eczema     Home Medications  Prior to Admission medications   Medication Sig Start Date End Date Taking? Authorizing Provider  carvedilol (COREG) 25 MG tablet Take 1 tablet (25 mg total) by mouth 2 (two) times daily with a meal. 12/21/20  Yes Chandrasekhar, Mahesh A, MD  dapagliflozin propanediol (FARXIGA) 10 MG TABS tablet Take 1 tablet (10 mg total) by mouth daily before breakfast. 09/19/20  Yes SDonato Heinz MD  lactose free nutrition (BOOST) LIQD Take 237 mLs by mouth daily.   Yes [provider]  sacubitril-valsartan (ENTRESTO) 24-26 MG Take 1 tablet by mouth 2 (two) times daily. 1 tablet by mouth twice a day 05/04/21  Yes Chandrasekhar, Mahesh A, MD  apixaban (ELIQUIS) 5 MG TABS tablet Take 1 tablet (5 mg total) by mouth 2 (two) times daily. Patient not taking: Reported on 07/28/2021 12/27/20   CWerner Lean MD     Critical care time: 45 min.     LJulian Hy DO 07/28/21 4:03 PM [facility]

## 2021-07-28 NOTE — Progress Notes (Signed)
Pt refused meds this meds this morning  MD came in and pt was agreeable to take meds afterward  Once MD left Pt requested I give him "crack" and instructed me on how to make it and administer it  Pt request and conversations were not entertained  Will continue to update and monitor

## 2021-07-29 ENCOUNTER — Inpatient Hospital Stay (HOSPITAL_COMMUNITY): Payer: Medicare (Managed Care)

## 2021-07-29 DIAGNOSIS — I480 Paroxysmal atrial fibrillation: Secondary | ICD-10-CM

## 2021-07-29 DIAGNOSIS — F10931 Alcohol use, unspecified with withdrawal delirium: Secondary | ICD-10-CM | POA: Diagnosis not present

## 2021-07-29 DIAGNOSIS — R579 Shock, unspecified: Secondary | ICD-10-CM | POA: Diagnosis not present

## 2021-07-29 LAB — GLUCOSE, CAPILLARY
Glucose-Capillary: 102 mg/dL — ABNORMAL HIGH (ref 70–99)
Glucose-Capillary: 110 mg/dL — ABNORMAL HIGH (ref 70–99)
Glucose-Capillary: 126 mg/dL — ABNORMAL HIGH (ref 70–99)
Glucose-Capillary: 129 mg/dL — ABNORMAL HIGH (ref 70–99)
Glucose-Capillary: 130 mg/dL — ABNORMAL HIGH (ref 70–99)
Glucose-Capillary: 98 mg/dL (ref 70–99)

## 2021-07-29 LAB — BASIC METABOLIC PANEL
Anion gap: 6 (ref 5–15)
BUN: 22 mg/dL (ref 8–23)
CO2: 24 mmol/L (ref 22–32)
Calcium: 8.1 mg/dL — ABNORMAL LOW (ref 8.9–10.3)
Chloride: 114 mmol/L — ABNORMAL HIGH (ref 98–111)
Creatinine, Ser: 1.25 mg/dL — ABNORMAL HIGH (ref 0.61–1.24)
GFR, Estimated: >60 mL/min (ref >60)
Glucose, Bld: 119 mg/dL — ABNORMAL HIGH (ref 70–99)
Potassium: 3.8 mmol/L (ref 3.5–5.1)
Sodium: 144 mmol/L (ref 135–145)

## 2021-07-29 LAB — CBC
HCT: 31.1 % — ABNORMAL LOW (ref 39.0–52.0)
Hemoglobin: 10.6 g/dL — ABNORMAL LOW (ref 13.0–17.0)
MCH: 31.4 pg (ref 26.0–34.0)
MCHC: 34.1 g/dL (ref 30.0–36.0)
MCV: 92 fL (ref 80.0–100.0)
Platelets: 224 10*3/uL (ref 150–400)
RBC: 3.38 MIL/uL — ABNORMAL LOW (ref 4.22–5.81)
RDW: 14.5 % (ref 11.5–15.5)
WBC: 9 10*3/uL (ref 4.0–10.5)
nRBC: 0 % (ref 0.0–0.2)

## 2021-07-29 LAB — PHOSPHORUS: Phosphorus: 2.5 mg/dL (ref 2.5–4.6)

## 2021-07-29 LAB — MAGNESIUM: Magnesium: 2 mg/dL (ref 1.7–2.4)

## 2021-07-29 MED ORDER — GLYCOPYRROLATE 1 MG PO TABS
1.0000 mg | ORAL_TABLET | Freq: Three times a day (TID) | ORAL | Status: DC | PRN
  Administered 2021-07-30 (×2): 1 mg via ORAL
  Filled 2021-07-29 (×3): qty 1

## 2021-07-29 NOTE — Progress Notes (Addendum)
NAME:  Austin Cobb, MRN:  [id number], DOB:  1960-04-21, LOS: 7 ADMISSION DATE:  07/09/2021, CONSULTATION DATE:  07/28/21 REFERRING MD:  Pasty Arch, CHIEF COMPLAINT:  cardiogenic shock   History of Present Illness:  Mr. Austin Cobb is a 61 y/o gentleman with a history of ETOH & tobacco abuse, HFrEF, CKD 3b, bipolar disorder who presented on 5/13 with CP, lightheadedness, n/v. He also has odynophagia with pain in his chest after swallowing. He had an episode of hematemesis the night before presentation.  He was found to have Aflutter with RVR and has been managed with beta blockers. Not felt to be a good candidate previously for DCCV due to esophageal pain and questionable compliance after discharge with DOAC. He underwent multiple cardioversions in 2019. He has been taken off Entresto due to AKI. He developed hypotension on the floor today and he was moved to the ICU due to concern for need for vasopressors for decompensated heart failure.    He drinks 2 x 40 oz beers per day usually, more the few days PTA. Last drink 24 hrs before admission.  He was initially treated with a  librium taper + CIWA but has not needed benzodiazepines recentl.y  [name] is flustered but denies acute complaints.   Pertinent  Medical History  ETOH abuse HFrEF Bipolar disorder GERD Tobacco abuse GERD  Significant Hospital Events: Including procedures, antibiotic start and stop dates in addition to other pertinent events   5/13 admitted 5/20 moved to ICU  Interim History / Subjective:  This morning he is babbling nonsensically.  Has gurgling speech as if moist secretions in oropharynx He is restrained in mitts.  Not letting nursing do prn NT suctioning  Objective   Blood pressure (!) 118/91, pulse (!) 113, temperature 98.6 F (37 C), temperature source Oral, resp. rate 17, height '5\' 9"'$  (1.753 m), weight 57.2 kg, SpO2 100 %.        Intake/Output Summary (Last 24 hours) at 07/29/2021 0853 Last data  filed at 07/29/2021 8416 Gross per 24 hour  Intake 1376.45 ml  Output 1102 ml  Net 274.45 ml   Filed Weights   07/20/2021 1800 07/28/21 0339 07/29/21 0412  Weight: 60 kg 57.8 kg 57.2 kg    Examination: Chronically ill appearing Clearly agitated in mitts Delirious and incomprehensible speech Moves all 4 extremities Tachycardic in 110s, irregularly irregular Minimal edema  Labs and imaging reviewed Na 144 K 3.8 Cr 1.25 WBC 9.0 Hcb 10.6 CBGs wnl  Resolved Hospital Problem list     Assessment & Plan:  Acute on chronic HFrEF due to tachyarrhythmia- A-flutter with RVR. -hold coreg - continue amiodarone -con't farxiga; when  -con't DOAC -monitor on tele CVPs and coox -check lactic acid, BNP -check electrolytes and replete PRN  ETOH withdrawal with Dts, acute metabolic encephalopathy. -on phenobarbital taper -CIWA protocol -vitamins  Moderate protein energy malnutrition -TF via cortrak -vitamins  AKI on CKD 3b -ok for PICC with improved renal function  Tobacco abuse -nicotine patch -will counsel on the importance of quitting when appropriate  GERD, possibly esophagitis -PPI -carafate  Best Practice (right click and "Reselect all SmartList Selections" daily)   Diet/type: tubefeeds DVT prophylaxis: DOAC GI prophylaxis: PPI Lines: N/A Foley:  N/A Code Status:  full code Last date of multidisciplinary goals of care discussion '[ ]'$    Critical care time: 34 min.    The patient is critically ill due to encephalopathy, alcohol withdrawal.  Critical care was necessary to treat  or prevent imminent or life-threatening deterioration.  Critical care was time spent personally by me on the following activities: development of treatment plan with patient and/or surrogate as well as nursing, discussions with consultants, evaluation of patient's response to treatment, examination of patient, obtaining history from patient or surrogate, ordering and performing treatments  and interventions, ordering and review of laboratory studies, ordering and review of radiographic studies, pulse oximetry, re-evaluation of patient's condition and participation in multidisciplinary rounds.   Critical Care Time devoted to patient care services described in this note is 34 minutes. This time reflects time of care of this Austin Cobb . This critical care time does not reflect separately billable procedures or procedure time, teaching time or supervisory time of PA/NP/Med student/Med Resident etc but could involve care discussion time.       Spero Austin Cobb [facility] 07/29/2021 8:56 AM  Pager: see AMION  If no response to pager , please call critical care on call (see AMION) until 7pm After 7:00 pm call Elink

## 2021-07-29 NOTE — Plan of Care (Signed)

## 2021-07-29 NOTE — Progress Notes (Signed)
eLink Physician-Brief Progress Note Patient Name: Austin Cobb DOB: 1960/05/24 MRN: [id number]   Date of Service  07/29/2021  HPI/Events of Note  Patient with copious oro-pharyngeal secretions that he is having trouble managing.  eICU Interventions  PRN Robinul ordered via NG tube.        Frederik Pear 07/29/2021, 10:43 PM

## 2021-07-29 NOTE — Progress Notes (Signed)
RRT paged for STAT NT suction. Copious clear (occasionally pink), frothy secretions removed from patient's airway.

## 2021-07-29 NOTE — Progress Notes (Signed)
Progress Note  Patient Name: Austin Cobb Date of Encounter: 07/29/2021  Primary Cardiologist: Werner Lean, MD   Subjective   Feels better today.   Inpatient Medications    Scheduled Meds:  apixaban  5 mg Per Tube BID   Chlorhexidine Gluconate Cloth  6 each Topical Daily   dapagliflozin propanediol  10 mg Oral Daily   feeding supplement (PROSource TF)  45 mL Per Tube Daily   folic acid  1 mg Intravenous Daily   guaiFENesin  15 mL Per Tube Q6H   multivitamin with minerals  1 tablet Per Tube Daily   nicotine  14 mg Transdermal Daily   pantoprazole sodium  40 mg Per Tube BID   PHENObarbital  100 mg Intravenous Q8H   Followed by   Derrill Memo ON 07/30/2021] PHENObarbital  65 mg Intravenous Q8H   Followed by   Derrill Memo ON 08/01/2021] PHENObarbital  32.5 mg Intravenous Q8H   sucralfate  1 g Per Tube TID WC & HS   thiamine  100 mg Per Tube Daily   Or   thiamine  100 mg Intravenous Daily   Continuous Infusions:  sodium chloride Stopped (07/28/21 1444)   amiodarone 30 mg/hr (07/29/21 0723)   feeding supplement (OSMOLITE 1.5 CAL) 55 mL/hr at 07/28/21 2339   PRN Meds: acetaminophen (TYLENOL) oral liquid 160 mg/5 mL **OR** acetaminophen, LORazepam, ondansetron **OR** ondansetron (ZOFRAN) IV   Vital Signs    Vitals:   07/29/21 0600 07/29/21 0700 07/29/21 0800 07/29/21 0901  BP: 100/78 112/83 (!) 118/91   Pulse: (!) 105 (!) 121 (!) 113   Resp: 18 (!) 24 17   Temp:    97.6 F (36.4 C)  TempSrc:    Oral  SpO2: 98% 100% 100%   Weight:      Height:        Intake/Output Summary (Last 24 hours) at 07/29/2021 0920 Last data filed at 07/29/2021 0723 Gross per 24 hour  Intake 1376.45 ml  Output 1102 ml  Net 274.45 ml   Filed Weights   08/01/2021 1800 07/28/21 0339 07/29/21 0412  Weight: 60 kg 57.8 kg 57.2 kg    Telemetry    Atrial flutter with 2:1 and 3:1 AV conduction - Personally Reviewed  ECG    none - Personally Reviewed  Physical Exam   GEN:  chronically ill appearing with feeding tube in place, acute distress.   Neck: 7 cm JVD Cardiac: Reg tachy, no murmurs, rubs, or gallops.  Respiratory: Clear to auscultation bilaterally. GI: Soft, nontender, non-distended  MS: No edema; No deformity. Neuro:  Nonfocal  Psych: Normal affect   Labs    Chemistry Recent Labs  Lab 07/25/21 0658 07/27/21 0656 07/28/21 0620 07/29/21 0145  NA 142 143 145 144  K 3.7 3.9 3.8 3.8  CL 114* 113* 115* 114*  CO2 20* 24 21* 24  GLUCOSE 92 111* 116* 119*  BUN '20 18 20 22  '$ CREATININE 1.28* 1.24 1.07 1.25*  CALCIUM 8.3* 8.3* 8.3* 8.1*  PROT 6.6 6.2* 6.5  --   ALBUMIN 2.9* 2.6* 2.7*  --   AST '17 17 17  '$ --   ALT '13 12 11  '$ --   ALKPHOS 43 45 46  --   BILITOT 0.7 0.7 0.4  --   GFRNONAA >60 >60 >60 >60  ANIONGAP '8 6 9 6     '$ Hematology Recent Labs  Lab 07/25/21 0658 07/27/21 0656 07/29/21 0145  WBC 6.2 8.5 9.0  RBC 4.14* 3.74*  3.38*  HGB 12.9* 11.7* 10.6*  HCT 37.7* 34.6* 31.1*  MCV 91.1 92.5 92.0  MCH 31.2 31.3 31.4  MCHC 34.2 33.8 34.1  RDW 14.3 14.2 14.5  PLT 96* 169 224    Cardiac EnzymesNo results for input(s): TROPONINI in the last 168 hours. No results for input(s): TROPIPOC in the last 168 hours.   BNP Recent Labs  Lab 07/28/21 1551  BNP 1,220.8*     DDimer No results for input(s): DDIMER in the last 168 hours.   Radiology    DG Abd Portable 1V  Result Date: 07/27/2021 CLINICAL DATA:  NG tube placement EXAM: PORTABLE ABDOMEN - 1 VIEW COMPARISON:  None Available. FINDINGS: Limited radiograph of the lower chest and upper abdomen was obtained for the purposes of enteric tube localization. Enteric tube is seen coursing below the diaphragm with distal tip terminating within the proximal duodenum. Enteric contrast is seen throughout the small bowel. Bowel gas pattern is nonobstructive. IMPRESSION: Enteric tube terminates within the proximal duodenum. Electronically Signed   By: Davina Poke D.O.   On: 07/27/2021 16:43    DG Swallowing Func-Speech Pathology  Result Date: 07/27/2021 Table formatting from the original result was not included. Objective Swallowing Evaluation: Type of Study: MBS-Modified Barium Swallow Study  Patient Details Name: SHANDON BURLINGAME MRN: [id number] Date of Birth: 12-27-1960 Today's Date: 07/27/2021 Time: SLP Start Time (ACUTE ONLY): 1350 -SLP Stop Time (ACUTE ONLY): 1404 SLP Time Calculation (min) (ACUTE ONLY): 14 min Past Medical History: Past Medical History: Diagnosis Date  Anxiety   Arthritis   Bipolar 1 disorder ([place])   Depression   Bipolar, not on meds/pt. stopped meds on own over 12 yrs ago  GERD (gastroesophageal reflux disease)   History of colon polyps   Noncompliance 05/14/2019  Substance abuse ([place])   41yr ago smoke crack and marjuana  Tobacco abuse 05/13/2012 Past Surgical History: Past Surgical History: Procedure Laterality Date  CARDIOVERSION N/A 05/06/2019  Procedure: CARDIOVERSION;  Surgeon: Nahser, PWonda Cheng MD;  [facility]  Service: Cardiovascular;  Laterality: N/A;  CARDIOVERSION N/A 05/20/2019  Procedure: CARDIOVERSION;  Surgeon: MLarey Dresser MD;  [place] [facility]  Service: Cardiovascular;  Laterality: N/A;  EXAMINATION UNDER ANESTHESIA N/A 11/12/2012  Procedure: EJasmine DecemberUNDER ANESTHESIA;  Surgeon: SAdin Hector MD;  [facility];  Service: General;  Laterality: N/A;  EYE SURGERY    bilateral eye sx as a child [id number].  RIGHT/LEFT HEART CATH AND CORONARY ANGIOGRAPHY N/A 05/18/2019  Procedure: RIGHT/LEFT HEART CATH AND CORONARY ANGIOGRAPHY;  Surgeon: McLarey DresserMD;  [facility];  Service: Cardiovascular;  Laterality: N/A;  SPHINCTEROTOMY N/A 11/12/2012  Procedure: SPHINCTEROTOMY;  Surgeon: StAdin HectorMD;  Location: [facility];  Service: General;  Laterality: N/A;  TEE WITHOUT CARDIOVERSION N/A 05/06/2019  Procedure: TRANSESOPHAGEAL ECHOCARDIOGRAM (TEE);  Surgeon: NaAcie FredricksonhWonda ChengMD;  [facility] Service: Cardiovascular;   Laterality: N/A;  TEE WITHOUT CARDIOVERSION N/A 05/20/2019  Procedure: TRANSESOPHAGEAL ECHOCARDIOGRAM (TEE);  Surgeon: McLarey DresserMD;  [facility];  Service: Cardiovascular;  Laterality: N/A;  TONSILLECTOMY   HPI: GrJAQUES MINEERs a [age].o. male with past medical history of GERD, substance abuse, combined systolic and diastolic heart failure,  HTN, atrial flutter, CKD stage IIIb, bipolar disorder, and heparin-induced thrombocytopenia who presented to the ED for evaluation for 5 days of chest pain, nausea, vomiting, lightheadedness, and fatigue. Stated to MD has pain in his esophagus with swallowing that he feels  is related to his chest pain. Currentl alcohol abuse per H & P.  No data recorded  Recommendations for follow up therapy are one component of a multi-disciplinary discharge planning process, led by the attending physician.  Recommendations may be updated based on patient status, additional functional criteria and insurance authorization. Assessment / Plan / Recommendation   07/27/2021   2:58 PM Clinical Impressions Clinical Impression Pt is deconditioned with periods of mild confusion and pharyngeal congestion and vocal quality wetness at baseline. He has what appears to be cervical osteophytes and cervical kyphosis which contribute to his dysphagia. He exhibited mild-moderate oropharyngeal dysphagia with impairments in oral cohesion, timeliness of transit and lingual residue. Swallow onset is intermittently delayed to his valleculae from 5-8 seconds before swallow triggered. Weakness of pharyngeal contraction led to pharyngeal residue in valleculae and pyriform sinues unable to clear with cues for dry swallows. Timing of laryngeal closure was delayed resulting in penetration with nectar thick. Barium seen starting to spill over interarytenoid space and as pt was at rest a large amount of residue in pyriform sinuses fell and was aspirated into airway. He had an immediate cough response  and unable to determine how much was expelled. Various consistencies trialed to reduce residue however opportunity for aspiration is increased. Given amount of residue in pyriforms and forward positioning at rest, chin tuck not attempted. Pt's clinical presentation, current deconditioning and weakness lead to recommendation of NPO status at this time with alternative means. Prognosis is good for return to po's hopefully during this hospital admission. ST will continue to work with pt on oropharyngeal swallow. SLP Visit Diagnosis Dysphagia, oropharyngeal phase (R13.12) Impact on safety and function Moderate aspiration risk     07/27/2021   2:58 PM Treatment Recommendations Treatment Recommendations Therapy as outlined in treatment plan below     07/27/2021   2:58 PM Prognosis Prognosis for Safe Diet Advancement Good Barriers to Reach Goals Cognitive deficits   07/27/2021   2:58 PM Diet Recommendations SLP Diet Recommendations NPO Medication Administration Via alternative means     07/27/2021   2:58 PM Other Recommendations Oral Care Recommendations Oral care QID Follow Up Recommendations -- Assistance recommended at discharge Frequent or constant Supervision/Assistance Functional Status Assessment Patient has had a recent decline in their functional status and demonstrates the ability to make significant improvements in function in a reasonable and predictable amount of time.   07/27/2021   2:58 PM Frequency and Duration  Speech Therapy Frequency (ACUTE ONLY) min 2x/week Treatment Duration 2 weeks     07/27/2021   2:58 PM Oral Phase Oral Phase Impaired Oral - Nectar Teaspoon Delayed oral transit Oral - Nectar Cup Lingual/palatal residue;Delayed oral transit;Weak lingual manipulation;Reduced posterior propulsion Oral - Nectar Straw Lingual/palatal residue;Delayed oral transit;Weak lingual manipulation;Reduced posterior propulsion Oral - Thin Straw Delayed oral transit Oral - Puree Delayed oral transit Oral - Regular Weak  lingual manipulation;Delayed oral transit    07/27/2021   2:58 PM Pharyngeal Phase Pharyngeal Phase Impaired Pharyngeal- Nectar Teaspoon Pharyngeal residue - valleculae;Reduced pharyngeal peristalsis;Reduced tongue base retraction Pharyngeal- Nectar Cup Delayed swallow initiation-vallecula;Pharyngeal residue - valleculae;Pharyngeal residue - pyriform;Inter-arytenoid space residue Pharyngeal- Nectar Straw Delayed swallow initiation-vallecula;Pharyngeal residue - pyriform;Pharyngeal residue - valleculae;Penetration/Apiration after swallow Pharyngeal Material enters airway, passes BELOW cords and not ejected out despite cough attempt by patient Pharyngeal- Thin Straw Pharyngeal residue - pyriform;Pharyngeal residue - valleculae Pharyngeal- Puree Pharyngeal residue - valleculae;Reduced pharyngeal peristalsis Pharyngeal- Regular Pharyngeal residue - valleculae    07/27/2021   2:58 PM  Cervical Esophageal Phase  Cervical Esophageal Phase WFL [name] 07/27/2021, 3:37 PM                     [place] EKG SITE RITE  Result Date: 07/28/2021 If Site Rite image not attached, placement could not be confirmed due to current cardiac rhythm.   Cardiac Studies   See above  Patient Profile     61 y.o. male admitted with atrial flutter and acute on chronic systolic heart failure in the setting of poly substance abuse and renal failure.   Assessment & Plan    Acute hypotension - his bp is improved and he is now off of levophed. I wonder if he in the setting of atrial flutter and some dehydration dropped his pressure from 25 of coreg. He is better. Hold beta blocker.  Atrial flutter - he is mostly 2:1 and his rate is improved a bit on IV amio. Continue amio. He is a poor candidate for all treatments for atrial flutter.  Acute on chronic systolic heart failure - he appears euvolemic. He c/o being thirsty though it is difficult for me to understand his speech.  Anemia - his hematocrit dropped from 37 to 31 in 2 days.  Consider GI eval. I suspect that his varices are oozing.     For questions or updates, please contact [place] Please consult [email or web address] for contact info under Cardiology/STEMI.      Signed, Cristopher Peru, MD  07/29/2021, 9:20 AM

## 2021-07-29 NOTE — Progress Notes (Signed)
Secure chat with Dr. Shearon Stalls regarding the PICC line order. Pt off vasopressors and currently has 2 PIV sites working properly. PICC order cancelled.

## 2021-07-30 DIAGNOSIS — F10931 Alcohol use, unspecified with withdrawal delirium: Secondary | ICD-10-CM | POA: Diagnosis not present

## 2021-07-30 LAB — BASIC METABOLIC PANEL
Anion gap: 6 (ref 5–15)
BUN: 21 mg/dL (ref 8–23)
CO2: 26 mmol/L (ref 22–32)
Calcium: 8.2 mg/dL — ABNORMAL LOW (ref 8.9–10.3)
Chloride: 115 mmol/L — ABNORMAL HIGH (ref 98–111)
Creatinine, Ser: 1.14 mg/dL (ref 0.61–1.24)
GFR, Estimated: >60 mL/min (ref >60)
Glucose, Bld: 119 mg/dL — ABNORMAL HIGH (ref 70–99)
Potassium: 4.1 mmol/L (ref 3.5–5.1)
Sodium: 147 mmol/L — ABNORMAL HIGH (ref 135–145)

## 2021-07-30 LAB — CBC
HCT: 30.4 % — ABNORMAL LOW (ref 39.0–52.0)
Hemoglobin: 9.9 g/dL — ABNORMAL LOW (ref 13.0–17.0)
MCH: 30.8 pg (ref 26.0–34.0)
MCHC: 32.6 g/dL (ref 30.0–36.0)
MCV: 94.7 fL (ref 80.0–100.0)
Platelets: 237 10*3/uL (ref 150–400)
RBC: 3.21 MIL/uL — ABNORMAL LOW (ref 4.22–5.81)
RDW: 14.6 % (ref 11.5–15.5)
WBC: 13.3 10*3/uL — ABNORMAL HIGH (ref 4.0–10.5)
nRBC: 0 % (ref 0.0–0.2)

## 2021-07-30 LAB — PHOSPHORUS: Phosphorus: 2.5 mg/dL (ref 2.5–4.6)

## 2021-07-30 LAB — GLUCOSE, CAPILLARY
Glucose-Capillary: 124 mg/dL — ABNORMAL HIGH (ref 70–99)
Glucose-Capillary: 81 mg/dL (ref 70–99)
Glucose-Capillary: 113 mg/dL — ABNORMAL HIGH (ref 70–99)
Glucose-Capillary: 113 mg/dL — ABNORMAL HIGH (ref 70–99)
Glucose-Capillary: 122 mg/dL — ABNORMAL HIGH (ref 70–99)

## 2021-07-30 LAB — MAGNESIUM: Magnesium: 1.8 mg/dL (ref 1.7–2.4)

## 2021-07-30 MED ORDER — FREE WATER
100.0000 mL | Freq: Four times a day (QID) | Status: DC
  Administered 2021-07-30 – 2021-08-07 (×33): 100 mL

## 2021-07-30 MED ORDER — THIAMINE HCL 100 MG/ML IJ SOLN
500.0000 mg | Freq: Three times a day (TID) | INTRAVENOUS | Status: AC
  Administered 2021-07-30 – 2021-08-01 (×6): 500 mg via INTRAVENOUS
  Filled 2021-07-30 (×6): qty 5

## 2021-07-30 MED ORDER — THIAMINE HCL 100 MG PO TABS: 100.0000 mg | ORAL_TABLET | Freq: Every day | ORAL | Status: DC

## 2021-07-30 NOTE — Progress Notes (Signed)
CSW acknowledges consult for substance use. CSW following to complete assessment with patient when appropriate. CSW will continue to follow.

## 2021-07-30 NOTE — Progress Notes (Signed)
Progress Note  Patient Name: Austin Cobb Date of Encounter: 07/30/2021  [facility] Cardiologist: Werner Lean, MD   Subjective   Laying in bed, somewhat disheveled, mittens in place, mumbling when trying to speak.  Inpatient Medications    Scheduled Meds:  apixaban  5 mg Per Tube BID   Chlorhexidine Gluconate Cloth  6 each Topical Daily   dapagliflozin propanediol  10 mg Oral Daily   feeding supplement (PROSource TF)  45 mL Per Tube Daily   folic acid  1 mg Intravenous Daily   guaiFENesin  15 mL Per Tube Q6H   multivitamin with minerals  1 tablet Per Tube Daily   nicotine  14 mg Transdermal Daily   pantoprazole sodium  40 mg Per Tube BID   PHENObarbital  100 mg Intravenous Q8H   Followed by   PHENObarbital  65 mg Intravenous Q8H   Followed by   Derrill Memo ON 08/01/2021] PHENObarbital  32.5 mg Intravenous Q8H   sucralfate  1 g Per Tube TID WC & HS   thiamine  100 mg Per Tube Daily   Or   thiamine  100 mg Intravenous Daily   Continuous Infusions:  sodium chloride Stopped (07/28/21 1444)   amiodarone 30 mg/hr (07/30/21 0336)   feeding supplement (OSMOLITE 1.5 CAL) 1,000 mL (07/30/21 0011)   PRN Meds: acetaminophen (TYLENOL) oral liquid 160 mg/5 mL **OR** acetaminophen, glycopyrrolate, LORazepam, ondansetron **OR** ondansetron (ZOFRAN) IV   Vital Signs    Vitals:   07/30/21 0500 07/30/21 0600 07/30/21 0700 07/30/21 0800  BP: (!) 134/96 120/84 (!) 132/99 (!) 135/110  Pulse: (!) 121 (!) 109 (!) 106 (!) 121  Resp: (!) '21 18 20 19  '$ Temp: 100 F (37.8 C)  98.6 F (37 C)   TempSrc: Axillary  Axillary   SpO2: 100% 100% 100% 98%  Weight: 57.3 kg     Height:        Intake/Output Summary (Last 24 hours) at 07/30/2021 0826 Last data filed at 07/30/2021 0336 Gross per 24 hour  Intake 1495.73 ml  Output 900 ml  Net 595.73 ml      07/30/2021    5:00 AM 07/29/2021    4:12 AM 07/28/2021    3:39 AM  Last 3 Weights  Weight (lbs) 126 lb 5.2 oz 126 lb 1.7 oz  127 lb 6.8 oz  Weight (kg) 57.3 kg 57.2 kg 57.8 kg      Telemetry    Atrial flutter 3-1, 2-1- Personally Reviewed  ECG    As below- Personally Reviewed  Physical Exam   GEN: Disheveled Neck: No JVD Cardiac: Mildly tachycardic and mostly regular , no murmurs, rubs, or gallops.  Respiratory: Clear to auscultation bilaterally. GI: Soft, nontender, non-distended  MS: No edema; No deformity. Neuro:  Nonfocal  Psych: Mittens in place  Labs    High Sensitivity Troponin:   Recent Labs  Lab 07/19/2021 0928 07/22/2021 1131  TROPONINIHS 45* 38*     Chemistry Recent Labs  Lab 07/25/21 0658 07/27/21 0656 07/28/21 0620 07/29/21 0145 07/30/21 0114  NA 142 143 145 144 147*  K 3.7 3.9 3.8 3.8 4.1  CL 114* 113* 115* 114* 115*  CO2 20* 24 21* 24 26  GLUCOSE 92 111* 116* 119* 119*  BUN '20 18 20 22 21  '$ CREATININE 1.28* 1.24 1.07 1.25* 1.14  CALCIUM 8.3* 8.3* 8.3* 8.1* 8.2*  MG 1.7  --  1.7 2.0 1.8  PROT 6.6 6.2* 6.5  --   --  ALBUMIN 2.9* 2.6* 2.7*  --   --   AST '17 17 17  '$ --   --   ALT '13 12 11  '$ --   --   ALKPHOS 43 45 46  --   --   BILITOT 0.7 0.7 0.4  --   --   GFRNONAA >60 >60 >60 >60 >60  ANIONGAP '8 6 9 6 6    '$ Lipids No results for input(s): CHOL, TRIG, HDL, LABVLDL, LDLCALC, CHOLHDL in the last 168 hours.  Hematology Recent Labs  Lab 07/27/21 0656 07/29/21 0145 07/30/21 0114  WBC 8.5 9.0 13.3*  RBC 3.74* 3.38* 3.21*  HGB 11.7* 10.6* 9.9*  HCT 34.6* 31.1* 30.4*  MCV 92.5 92.0 94.7  MCH 31.3 31.4 30.8  MCHC 33.8 34.1 32.6  RDW 14.2 14.5 14.6  PLT 169 224 237   Thyroid No results for input(s): TSH, FREET4 in the last 168 hours.  BNP Recent Labs  Lab 07/28/21 1551  BNP 1,220.8*    DDimer No results for input(s): DDIMER in the last 168 hours.   Radiology    DG Chest Port 1 View  Result Date: 07/29/2021 CLINICAL DATA:  Shortness of breath. EXAM: PORTABLE CHEST 1 VIEW COMPARISON:  07/17/2021 FINDINGS: The cardiomediastinal silhouette is unremarkable.  Pulmonary vascular congestion is noted. There is no evidence of focal airspace disease, pulmonary edema, suspicious pulmonary nodule/mass, pleural effusion, or pneumothorax. No acute bony abnormalities are identified. A small bore feeding tube is noted entering the stomach with tip off the field of view. IMPRESSION: Pulmonary vascular congestion. Electronically Signed   By: Margarette Canada M.D.   On: 07/29/2021 19:03   [place] EKG SITE RITE  Result Date: 07/28/2021 If Site Rite image not attached, placement could not be confirmed due to current cardiac rhythm.   Cardiac Studies   Echocardiogram 08/01/2020:    1. Left ventricular ejection fraction, by estimation, is <20%. The left  ventricle has severely decreased function. The left ventricle demonstrates  global hypokinesis. There is moderate left ventricular hypertrophy of the  posterior segment.   2. Right ventricular systolic function is normal. The right ventricular  size is mildly enlarged.   3. Left atrial size was moderately dilated.   4. The pericardial effusion is posterior to the left ventricle.   5. The mitral valve is normal in structure. Mild to moderate mitral valve  regurgitation. No evidence of mitral stenosis.   6. The aortic valve is normal in structure. Aortic valve regurgitation is  not visualized. No aortic stenosis is present.   7. The inferior vena cava is dilated in size with <50% respiratory  variability, suggesting right atrial pressure of 15 mmHg.   Comparison(s): Prior EF 25-30%.   Right heart catheterization 05/18/2019: [facility] Procedural Findings: Hemodynamics (mmHg) RA mean 3 RV 22/1 PA 22/7, mean 12 PCWP mean 6 LV 126/9 AO 138/75  Oxygen saturations: PA 74% AO 100%  Cardiac Output (Fick) 5.48  Cardiac Index (Fick) 3.19  Patient Profile     61 y.o. male admitted with acute on chronic systolic heart failure in the setting of polysubstance abuse renal failure atrial flutter  Assessment & Plan    Acute  hypotension - Blood pressure has improved, off Levophed.  Possibly dehydrated previously in addition to carvedilol 25 mg twice a day and atrial flutter.  Continuing to hold beta-blocker.  Question reliability of arm cuff.  Some readings are 110/80 which is probably most accurate  Atrial flutter - Mostly 2-1.  IV amiodarone  has improved his rate.  Go ahead and continue the amiodarone.  Agree with Dr. Lovena Le, EP, he is a poor candidate for all treatments for atrial flutter. -EKG on 07/25/2021 shows atrial flutter 2-1 at 131 bpm nonspecific ST-T wave changes -Previously underwent multiple cardioversions in 2019.  He is not a good candidate for further procedures.  Acute on chronic systolic heart failure - Euvolemic.  Ejection fraction less than 20  Alcohol/cocaine use - Urine tox positive for cocaine.  Anemia - Decreasing hemoglobin most recently 9.9 seen in the 12 range.  Question esophageal varices.  Had hematemesis after admission.  Elevated troponin - Secondary to myocardial injury in the setting of atrial flutter with rapid ventricular response and underlying cardiomyopathy.    For questions or updates, please contact [place] Please consult [email or web address] for contact info under        Signed, Candee Furbish, MD  07/30/2021, 8:26 AM

## 2021-07-30 NOTE — Progress Notes (Signed)
Patient NT suctioned.  Copious amount of thin white secretions returned.      Noe Gens, MSN, APRN, NP-C, AGACNP-BC [facility] 07/30/2021, 2:56 PM   Please see [email or web address] for pager details.   From 7A-7P if no response, please call [phone] After hours, please call ELink [phone]

## 2021-07-30 NOTE — Progress Notes (Signed)
NAME:  Austin Cobb, MRN:  [id number], DOB:  12-26-1960, LOS: 8 ADMISSION DATE:  07/23/2021, CONSULTATION DATE:  07/28/21 REFERRING MD:  Pasty Arch, CHIEF COMPLAINT:  Cardiogenic shock   History of Present Illness:  Mr. Austin Cobb is a 61 y/o gentleman with a history of ETOH & tobacco abuse, HFrEF, CKD 3b, bipolar disorder who presented on 5/13 with CP, lightheadedness, n/v. He also has odynophagia with pain in his chest after swallowing. He had an episode of hematemesis the night before presentation.  He was found to have Aflutter with RVR and has been managed with beta blockers. Not felt to be a good candidate previously for DCCV due to esophageal pain and questionable compliance after discharge with DOAC. He underwent multiple cardioversions in 2019. He has been taken off Entresto due to AKI. He developed hypotension on the floor & he was moved to the ICU due to concern for need for vasopressors for decompensated heart failure.    He drinks 2 x 40 oz beers per day usually, more the few days PTA. Last drink 24 hrs before admission.  He was initially treated with a  librium taper + CIWA but has not needed benzodiazepines recently.  Pertinent  Medical History  ETOH abuse HFrEF Bipolar disorder GERD Tobacco abuse GERD  Significant Hospital Events: Including procedures, antibiotic start and stop dates in addition to other pertinent events   5/13 admitted 5/20 moved to ICU 5/21 Babbling nonsensically, moist speech/gurgling, mitts in place, refusing NTS  Interim History / Subjective:  Afebrile / tmax 100 / WBC 13.3 RA sats 100% Glucose range 81-124  I/O UOP 972m, +6570min last 24 hours  Objective   Blood pressure (!) 135/110, pulse (!) 121, temperature 98.6 F (37 C), temperature source Axillary, resp. rate 19, height '5\' 9"'$  (1.753 m), weight 57.3 kg, SpO2 98 %.        Intake/Output Summary (Last 24 hours) at 07/30/2021 1013 Last data filed at 07/30/2021 [id number] per 24 hour   Intake 1495.73 ml  Output 900 ml  Net 595.73 ml   Filed Weights   07/28/21 0339 07/29/21 0412 07/30/21 0500  Weight: 57.8 kg 57.2 kg 57.3 kg    Examination: General: cachectic, chronically ill appearing adult male lying in bed in NAD HEENT: MM pink/dry, moist voice/cough Neuro: awake/alert, oriented to self, place, MAE /non-focal CV: s1s2 regular, flutter on monitor 110's, no m/r/g PULM: non-labored at rest, lungs bilaterally with rhonchi, wet upper airway noise GI: soft, bsx4 active  Extremities: warm/dry, no edema  Skin: no rashes or lesions  Resolved Hospital Problem list     Assessment & Plan:   Acute on chronic HFrEF due to tachyarrhythmia A-flutter with RVR Elevated Troponin 2/2 Myocardial Injury due to AFlutter -continue IV amiodarone -case reviewed with EP per Cardiology > pt is not a candidate for more advanced therapies or cardioversion  -tele monitoring  -continue eliquis, farxiga  ETOH withdrawal with Dts, acute metabolic encephalopathy. -continue phenobarbital taper  -CIWA protocol  -MVI/folate/thiamine  -PRN ativan for agitation   At Risk Respiratory Failure  In setting of poor secretion clearance, general deconditioning / weakness  -pulmonary hygiene as able  -follow intermittent CXR  Moderate protein energy malnutrition -TF via cortrak   AKI on CKD 3b Hypernatremia  -add free water  -Trend BMP / urinary output -Replace electrolytes as indicated -Avoid nephrotoxic agents, ensure adequate renal perfusion  Tobacco abuse -nicotine patch  -cessation counseling when patient able to participate   GERD, possibly  esophagitis -PPI -continue carafate  -monitor for bleeding  Best Practice (right click and "Reselect all SmartList Selections" daily)  Diet/type: tubefeeds DVT prophylaxis: DOAC GI prophylaxis: PPI Lines: N/A Foley:  N/A Code Status:  full code Last date of multidisciplinary goals of care discussion: no family present on am  rounds.    Critical care time: n/a   Noe Gens, MSN, APRN, NP-C, AGACNP-BC [facility] 07/30/2021, 10:13 AM   Please see [email or web address] for pager details.   From 7A-7P if no response, please call [phone] After hours, please call ELink [phone]

## 2021-07-30 NOTE — Progress Notes (Signed)
[facility] [place] [facility] Progress Note Patient Name: Austin Cobb DOB: 01/20/1961 MRN: [id number]   Date of Service  07/30/2021  HPI/Events of Note  Fever 101.1, WBC 13 No pressor requirement Suspect intermittent aspiration due to poor secretion clearance +/- atelectasis. Recent CXR with pulmonary vascular congestion  eICU Interventions  Order sputum culture PRN tylenol If clinically worsens will add antibiotics   07/31/21 @ 5:29 AM - Patient tachycardic and hypertensive to 150s. Gurgling. Awake with O2 sats >92%. Robinul ordered. High risk for intubation for suspected aspiration  Intervention Category Intermediate Interventions: Infection - evaluation and management  Melynda Krzywicki Rodman Pickle 07/30/2021, 10:25 PM

## 2021-07-31 ENCOUNTER — Other Ambulatory Visit (HOSPITAL_COMMUNITY): Payer: Self-pay

## 2021-07-31 ENCOUNTER — Inpatient Hospital Stay (HOSPITAL_COMMUNITY): Payer: Medicare (Managed Care)

## 2021-07-31 DIAGNOSIS — Z7189 Other specified counseling: Secondary | ICD-10-CM

## 2021-07-31 DIAGNOSIS — Z515 Encounter for palliative care: Secondary | ICD-10-CM

## 2021-07-31 DIAGNOSIS — F10931 Alcohol use, unspecified with withdrawal delirium: Secondary | ICD-10-CM | POA: Diagnosis not present

## 2021-07-31 LAB — PHOSPHORUS: Phosphorus: 3.2 mg/dL (ref 2.5–4.6)

## 2021-07-31 LAB — CBC
HCT: 32 % — ABNORMAL LOW (ref 39.0–52.0)
Hemoglobin: 10.7 g/dL — ABNORMAL LOW (ref 13.0–17.0)
MCH: 31.7 pg (ref 26.0–34.0)
MCHC: 33.4 g/dL (ref 30.0–36.0)
MCV: 94.7 fL (ref 80.0–100.0)
Platelets: 232 10*3/uL (ref 150–400)
RBC: 3.38 MIL/uL — ABNORMAL LOW (ref 4.22–5.81)
RDW: 14.8 % (ref 11.5–15.5)
WBC: 15.3 10*3/uL — ABNORMAL HIGH (ref 4.0–10.5)
nRBC: 0 % (ref 0.0–0.2)

## 2021-07-31 LAB — BASIC METABOLIC PANEL
Anion gap: 6 (ref 5–15)
BUN: 20 mg/dL (ref 8–23)
CO2: 27 mmol/L (ref 22–32)
Calcium: 8.1 mg/dL — ABNORMAL LOW (ref 8.9–10.3)
Chloride: 111 mmol/L (ref 98–111)
Creatinine, Ser: 1.2 mg/dL (ref 0.61–1.24)
GFR, Estimated: >60 mL/min (ref >60)
Glucose, Bld: 113 mg/dL — ABNORMAL HIGH (ref 70–99)
Potassium: 3.8 mmol/L (ref 3.5–5.1)
Sodium: 144 mmol/L (ref 135–145)

## 2021-07-31 LAB — PROCALCITONIN: Procalcitonin: <0.1 ng/mL

## 2021-07-31 LAB — MRSA NEXT GEN BY PCR, NASAL: MRSA by PCR Next Gen: DETECTED — AB

## 2021-07-31 LAB — GLUCOSE, CAPILLARY
Glucose-Capillary: 102 mg/dL — ABNORMAL HIGH (ref 70–99)
Glucose-Capillary: 111 mg/dL — ABNORMAL HIGH (ref 70–99)
Glucose-Capillary: 113 mg/dL — ABNORMAL HIGH (ref 70–99)
Glucose-Capillary: 117 mg/dL — ABNORMAL HIGH (ref 70–99)
Glucose-Capillary: 128 mg/dL — ABNORMAL HIGH (ref 70–99)
Glucose-Capillary: 67 mg/dL — ABNORMAL LOW (ref 70–99)
Glucose-Capillary: 80 mg/dL (ref 70–99)
Glucose-Capillary: 99 mg/dL (ref 70–99)

## 2021-07-31 LAB — MAGNESIUM: Magnesium: 1.9 mg/dL (ref 1.7–2.4)

## 2021-07-31 MED ORDER — POTASSIUM CHLORIDE 20 MEQ PO PACK
40.0000 meq | PACK | Freq: Once | ORAL | Status: AC
  Administered 2021-07-31: 40 meq
  Filled 2021-07-31: qty 2

## 2021-07-31 MED ORDER — MAGNESIUM SULFATE 2 GM/50ML IV SOLN
2.0000 g | Freq: Once | INTRAVENOUS | Status: AC
  Administered 2021-07-31: 2 g via INTRAVENOUS
  Filled 2021-07-31: qty 50

## 2021-07-31 MED ORDER — CHLORHEXIDINE GLUCONATE 0.12 % MT SOLN
15.0000 mL | Freq: Two times a day (BID) | OROMUCOSAL | Status: DC
  Administered 2021-07-31 – 2021-08-01 (×4): 15 mL via OROMUCOSAL
  Filled 2021-07-31 (×4): qty 15

## 2021-07-31 MED ORDER — ORAL CARE MOUTH RINSE
15.0000 mL | Freq: Two times a day (BID) | OROMUCOSAL | Status: DC
  Administered 2021-07-31 – 2021-08-01 (×4): 15 mL via OROMUCOSAL

## 2021-07-31 MED ORDER — VANCOMYCIN HCL 750 MG/150ML IV SOLN
750.0000 mg | INTRAVENOUS | Status: DC
  Administered 2021-08-01 – 2021-08-02 (×2): 750 mg via INTRAVENOUS
  Filled 2021-07-31 (×2): qty 150

## 2021-07-31 MED ORDER — SODIUM CHLORIDE 0.9 % IV SOLN: 2.0000 g | Freq: Three times a day (TID) | INTRAVENOUS | Status: DC

## 2021-07-31 MED ORDER — CARVEDILOL 6.25 MG PO TABS
6.2500 mg | ORAL_TABLET | Freq: Two times a day (BID) | ORAL | Status: DC
  Administered 2021-07-31 – 2021-08-01 (×2): 6.25 mg
  Filled 2021-07-31 (×2): qty 1

## 2021-07-31 MED ORDER — GLYCOPYRROLATE 0.2 MG/ML IJ SOLN
0.1000 mg | Freq: Three times a day (TID) | INTRAMUSCULAR | Status: DC
  Administered 2021-07-31 – 2021-08-01 (×5): 0.1 mg via INTRAVENOUS
  Filled 2021-07-31 (×5): qty 1

## 2021-07-31 MED ORDER — VANCOMYCIN HCL IN DEXTROSE 1-5 GM/200ML-% IV SOLN
1000.0000 mg | Freq: Once | INTRAVENOUS | Status: AC
  Administered 2021-07-31: 1000 mg via INTRAVENOUS
  Filled 2021-07-31: qty 200

## 2021-07-31 MED ORDER — SODIUM CHLORIDE 0.9 % IV SOLN
2.0000 g | Freq: Two times a day (BID) | INTRAVENOUS | Status: DC
  Administered 2021-07-31 – 2021-08-02 (×5): 2 g via INTRAVENOUS
  Filled 2021-07-31 (×5): qty 12.5

## 2021-07-31 MED ORDER — MUPIROCIN 2 % EX OINT
1.0000 "application " | TOPICAL_OINTMENT | Freq: Two times a day (BID) | CUTANEOUS | Status: AC
  Administered 2021-07-31 – 2021-08-04 (×10): 1 via NASAL
  Filled 2021-07-31 (×3): qty 22

## 2021-07-31 MED ORDER — CHLORHEXIDINE GLUCONATE CLOTH 2 % EX PADS
6.0000 | MEDICATED_PAD | Freq: Every day | CUTANEOUS | Status: AC
Start: 2021-07-31 — End: 2021-08-04
  Administered 2021-07-31 – 2021-08-04 (×6): 6 via TOPICAL

## 2021-07-31 NOTE — Progress Notes (Signed)
CSW received consult in assist with finding patients next of kin. CSW reached out to patients contact on face sheet Austin Cobb. CSW informed Sharyn Lull with [facility] is going to stay close to his phone and wait for [facility] with palliative to call. CSW informed michelle with palliative. Sharyn Lull was able to receive numbers for patients brother and sister. Consult complete.

## 2021-07-31 NOTE — Progress Notes (Signed)
NAME:  Austin Cobb, MRN:  [id number], DOB:  1961-01-11, LOS: 9 ADMISSION DATE:  07/19/2021, CONSULTATION DATE:  07/28/21 REFERRING MD:  Austin Cobb, CHIEF COMPLAINT:  Cardiogenic shock   History of Present Illness:  Austin Cobb is a 61 y/o gentleman with a history of ETOH & tobacco abuse, HFrEF, CKD 3b, bipolar disorder who presented on 5/13 with CP, lightheadedness, n/v. He also has odynophagia with pain in his chest after swallowing. He had an episode of hematemesis the night before presentation.  He was found to have Aflutter with RVR and has been managed with beta blockers. Not felt to be a good candidate previously for DCCV due to esophageal pain and questionable compliance after discharge with DOAC. He underwent multiple cardioversions in 2019. He has been taken off Entresto due to AKI. He developed hypotension on the floor & he was moved to the ICU due to concern for need for vasopressors for decompensated heart failure.    He drinks 2 x 40 oz beers per day usually, more the few days PTA. Last drink 24 hrs before admission.  He was initially treated with a  librium taper + CIWA but has not needed benzodiazepines recently.  Pertinent  Medical History  ETOH abuse HFrEF Bipolar disorder GERD Tobacco abuse GERD  Significant Hospital Events: Including procedures, antibiotic start and stop dates in addition to other pertinent events   5/13 admitted 5/20 moved to ICU 5/21 Babbling nonsensically, moist speech/gurgling, mitts in place, refusing NTS  Interim History / Subjective:  Tmax 101.1 overnight Respiratory culture with GPC's, GNR's  RN reports pt NTS overnight   Objective   Blood pressure 115/82, pulse (!) 120, temperature 98 F (36.7 C), temperature source Axillary, resp. rate 19, height '5\' 9"'$  (1.753 m), weight 50.7 kg, SpO2 100 %.        Intake/Output Summary (Last 24 hours) at 07/31/2021 0805 Last data filed at 07/31/2021 0700 Gross per 24 hour  Intake 1746.42 ml   Output 1150 ml  Net 596.42 ml   Filed Weights   07/29/21 0412 07/30/21 0500 07/31/21 0423  Weight: 57.2 kg 57.3 kg 50.7 kg    Examination: General: cachectic adult male sitting up in bed in NAD HEENT: MM pink/moist, anicteric, pupils =/reactive  Neuro: sleeping, awakens to voice & drifts back to sleep  CV: s1s2 rrr, AFlutter 115-120's on monitor, no m/r/g PULM: non-labored at rest, lungs bilaterally with good air entry, improved upper airway noise compared to 5/22 exam  GI: soft, bsx4 active, small bore feeding tube in place Extremities: warm/dry, no edema  Skin: no rashes or lesions  CXR 5/23 > no acute infiltrates  Resolved Hospital Problem list     Assessment & Plan:   Acute on chronic HFrEF due to tachyarrhythmia A-flutter with RVR Elevated Troponin 2/2 Myocardial Injury due to AFlutter -amiodarone IV, consider transition to PO  -appreciate Cardiology > per rec's, patient is not a candidate for advanced therapies from EP  -continue eliquis, farxiga -continue IV amiodarone  -tele monitoring   ETOH withdrawal with Dts, acute metabolic encephalopathy. -phenobarbital taper in place  -CIWA protocol  -continue thiamine, folate, MVI  -PRN ativan   At Risk Respiratory Failure  GNR, GPC's in Sputum, ? Tracheobronchitis  In setting of poor secretion clearance, general deconditioning / weakness  -chest PT via bed  -follow intermittent CXR  -ideally would like to avoid secretion drying agents > add stop time to robinul through 5/24, [place] -follow up respiratory culture, ? Tracheobronchitis  -  add cefepime + vanco empirically    Moderate protein energy malnutrition -TF via cortrak   AKI on CKD 3b Hypernatremia, Hypokalemia, Hypomagnesemia  -Na improved with free water -goal K>4, Mg>2 with AFlutter, KCL + Mg replacement  -Trend BMP / urinary output -Replace electrolytes as indicated -Avoid nephrotoxic agents, ensure adequate renal perfusion  Tobacco  abuse Polysubstance / ETOH Abuse  Hx cocaine, ETOH  -continue nicotine patch  -cessation counseling when patient able to participate   GERD, possibly esophagitis -PPI -carafate -no evidence of bleeding  Best Practice (right click and "Reselect all SmartList Selections" daily)  Diet/type: tubefeeds DVT prophylaxis: DOAC GI prophylaxis: PPI Lines: N/A Foley:  N/A Code Status:  full code Last date of multidisciplinary goals of care discussion: no family present.  Called friend that was listed in chart but number not in working order.  Additional number of Austin Cobb ("other"), called and message left for return call.    Critical care time: n/a   Austin Gens, MSN, APRN, NP-C, AGACNP-BC [facility] 07/31/2021, 8:05 AM   Please see [email or web address] for pager details.   From 7A-7P if no response, please call [phone] After hours, please call ELink [phone]

## 2021-07-31 NOTE — Progress Notes (Signed)
PT NTS x1 by RT with success. Copious amount of secretions. RT will continue to monitor.

## 2021-07-31 NOTE — Consult Note (Addendum)
Palliative Medicine Inpatient Consult Note  Consulting Provider: Candee Furbish, MD  Reason for consult:   Palliative Care Consult Services Palliative Medicine Consult   Symptom Management Consult  Reason for Consult? Recurrent admits, FTT, does cocaine, multiple severe comorbidities, help as may end up on vent without destination   07/31/2021  HPI:  Per hospitalist note --> Austin Cobb is a 61 y/o gentleman with a history of ETOH & tobacco abuse, HFrEF, CKD 3b, bipolar disorder who presented on 5/13 with CP, lightheadedness, n/v. He also has odynophagia with pain in his chest after swallowing. He had an episode of hematemesis the night before presentation.  He was found to have Aflutter with RVR and has been managed with beta blockers. Not felt to be a good candidate previously for DCCV due to esophageal pain and questionable compliance after discharge with DOAC. He underwent multiple cardioversions in 2019. He has been taken off Entresto due to AKI. He developed hypotension on the floor & he was moved to the ICU due to concern for need for vasopressors for decompensated heart failure.   Palliative care has been asked to get involved to further discuss goals of care.  Clinical Assessment/Goals of Care:  *Please note that this is a verbal dictation therefore any spelling or grammatical errors are due to the "[employer] [place] [employer]" system interpretation.  I have reviewed medical records including EPIC notes, labs and imaging, received report from bedside RN, assessed the patient - he is able to open his eyes when spoke to loudly though babbles incomprehensively.    I spoke to patient's friend, Austin Cobb to further discuss diagnosis prognosis, Austin Cobb, Austin Cobb, disposition and options.   I introduced Palliative Medicine as specialized medical care for people living with serious illness. It focuses on providing relief from the symptoms and stress of a serious illness. The goal is to improve quality of  life for both the patient and the family.  Medical History Review and Understanding:  I reviewed with Austin Cobb that Austin Cobb has significant heart failure, chronic kidney disease.  We discussed that he has a very longstanding history of alcohol and tobacco misuse.  In addition to that he has mental health ailments in the setting of bipolar disorder.  Social History:  Austin Cobb is originally from [place], [place].  He graduated from high school.  He was married though is divorced.  He never had children.  He has a brother Austin Cobb who lives in [place] and a sister, Austin Cobb who lives in [place].  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  He does have a degree of faith though is nondenominational.  Functional and Nutritional State:  Austin Cobb lives in a trailer park in [place].  Austin Cobb shares that he is in the process of buying his trailer home.  He does have a roommate, a male, Austin Cobb who does help to do things for him related to bADLs. Austin Cobb pays all of [name] bills for him and tries to help with coordination of care. Austin Cobb cannot comment on Austin Cobb's appetite.   Advance Directives:  Austin Cobb has no advance directives on file. Austin Cobb shares that from his understanding Austin Cobb has never completed these. Austin Cobb expresses that Austin Cobb has two living siblings though they are in different states.   We reviewed in that in [name] patients siblings would be his deferred decision makers unless they appointed Austin Cobb to be his primary Media planner.   Code Status:  Concepts specific  to code status, artifical feeding and hydration, continued IV antibiotics and rehospitalization was had.  The difference between a aggressive medical intervention path  and a palliative comfort care path for this patient at this time was had.   Encouraged friends/family to consider DNR/DNI status understanding evidenced based poor outcomes in similar hospitalized patient, as the cause  of arrest is likely associated with advanced chronic/terminal illness rather than an easily reversible acute cardio-pulmonary event. I explained that DNR/DNI does not change the medical plan and it only comes into effect after a person has arrested (died).  It is a protective measure to keep [place] from harming the patient in their last moments of life.   Austin Cobb is very clear that knowing Austin Cobb since 1964 he would likely not want CPR or intubation. He has lived a "hard life" and we would be prolonging an inevitable situation. He shares that he has no bearing on this decision however as patient has a living brother and sister. Austin Cobb was able to provide their names and numbers  Discussion:  A review of Austin Cobb's reason(s) for hospitalization was had inclusive of his chest pain. We reviewed that Austin Cobb appears to have end-stage heart failure with an EF < 20%. WE reviewed that this can often carry with it a very poor prognosis. Austin Cobb shares that Austin Cobb has little quality in his life as he sits in his trailer home drinking and smoking all day long. He expresses that many times it has been brought to Austin Cobb's attention to stop though he has not been able to. Reviewed the serious nature of patients present health condition(s) and how at anytime he could decompensate. Austin Cobb understood this and shares he is happy to advocate on behalf of the patient if patient brother and sister defer to him. We reviewed that this would be offered to them being that Austin Cobb is the closest friend to the patient at this time.   Discussed the need for additional conversations with Austin Cobb and Austin Cobb.   Discussed the importance of continued conversation with family and their  medical providers regarding overall plan of care and treatment options, ensuring decisions are within the context of the patients values and GOCs. _____________________________ Addendum:  Patients brother, Austin Cobb spoken to and updated. I have emailed a copy of the "Hard  Choices" Book for him to review.   Ongoing conversations.   Decision Maker: Patient has a living brother, Austin Cobb (in [place]) and sister, Arsenio Loader (in [place], [place]). Each have been called in attempt to update them on Gregs present health state. [facility] Brother     [phone]     [place]a Sister     [phone]     Heather Roberts [phone]   [phone]    SUMMARY OF RECOMMENDATIONS   Full code for the time being --> Awaiting additional conversations with patients brother and sister (VMs left on their phones)  Continue current care for the time being  Ongoing Palliative Support  Code Status/Advance Care Planning: FULL CODE  Palliative Prophylaxis:  Aspiration, Bowel Regimen, Delirium Protocol, Frequent Pain Assessment, Oral Care, Palliative Wound Care, and Turn Reposition  Additional Recommendations (Limitations, Scope, Preferences): Continue current plan of care  Psycho-social/Spiritual:  Desire for further Chaplaincy support: Not presently Additional Recommendations: Informed family and friends of patient chronic disease processes.   Prognosis: Very poor overall in the setting of polysubstance abuse and end stage heart failure. Patient has little control over present secretion management and is at very high risk of an aspirational event.  Discharge Planning: Unclear  Vitals:   07/31/21 0500 07/31/21 0600  BP: (!) 155/101 129/88  Pulse: (!) 125 (!) 127  Resp: (!) 21 (!) 21  Temp:    SpO2: 99% 100%    Intake/Output Summary (Last 24 hours) at 07/31/2021 0641 Last data filed at 07/31/2021 0600 Gross per 24 hour  Intake 1632.95 ml  Output 1150 ml  Net 482.95 ml   Last Weight  Most recent update: 07/31/2021  4:23 AM    Weight  50.7 kg (111 lb 12.4 oz)            Gen:  Chronically ill appearing M  HEENT: Coretrack in place, dry mucous membranes CV: Irregular rate and rhythm PULM: 2LPM [place], (+)rhonchi ABD: distended EXT:  Cachectic, No edema Neuro: Somnolent  PPS: 10%   This conversation/these recommendations were discussed with patient primary care team, Dr. Tamala Julian and APP, Velna Hatchet  Total Time: 220 --> Tremendous time spend trying to identify family members  Billing based on MDM: High  Problems Addressed: One acute or chronic illness or injury that poses a threat to life or bodily function  Amount and/or Complexity of Data: Category 3:Discussion of management or test interpretation with external physician/other qualified health care professional/appropriate source (not separately reported)  Risks: Decision regarding hospitalization or escalation of hospital care ______________________________________________________ [place] Team Team Cell Phone: [phone] Please utilize secure chat with additional questions, if there is no response within 30 minutes please call the above phone number  Palliative Medicine Team providers are available by phone from 7am to 7pm daily and can be reached through the team cell phone.  Should this patient require assistance outside of these hours, please call the patient's attending physician.

## 2021-07-31 NOTE — Progress Notes (Signed)
Speech Language Pathology Treatment: Dysphagia  Patient Details Name: Austin Cobb MRN: [id number] DOB: 07-29-1960 Today's Date: 07/31/2021 Time: 1240-1250 SLP Time Calculation (min) (ACUTE ONLY): 10 min  Assessment / Plan / Recommendation Clinical Impression  Mr. Fallert was seen for dysphagia treatment. He did not follow commands; voice wet, pt coughing at baseline but unable to expel secretions nor were they reachable with Antony Haste - he continues to require NT suctioning for this reason. Repositioned as able and provided oral care.  Mr. Hu was resistant; no spontaneous swallowing observed during/after oral care. His speech was intermittently intelligible, and communication was directed primarily at telling me to stop or to leave the room. No trial POs/ice chips were offered given mental status.  SLP will follow.   HPI HPI: Austin Cobb is a 61 y.o. male with past medical history of GERD, substance abuse, combined systolic and diastolic heart failure,  HTN, atrial flutter, CKD stage IIIb, bipolar disorder, and heparin-induced thrombocytopenia who presented to the ED for evaluation for 5 days of chest pain, nausea, vomiting, lightheadedness, and fatigue. Stated to MD has pain in his esophagus with swallowing that he feels is related to his chest pain. Currentl alcohol abuse per H & P.      SLP Plan  Continue with current plan of care      Recommendations for follow up therapy are one component of a multi-disciplinary discharge planning process, led by the attending physician.  Recommendations may be updated based on patient status, additional functional criteria and insurance authorization.    Recommendations  Diet recommendations: NPO                Oral Care Recommendations: Oral care QID Follow Up Recommendations: Other (comment) (tba) Assistance recommended at discharge: Frequent or constant Supervision/Assistance SLP Visit Diagnosis: Dysphagia, oropharyngeal phase  (R13.12) Plan: Continue with current plan of care         Sheika Coutts L. Tivis Ringer, [place] CCC/SLP Acute Rehabilitation Services Office number [phone] Pager [phone]   Assunta Curtis  07/31/2021, 12:57 PM

## 2021-07-31 NOTE — Progress Notes (Signed)
Pt NTS x1 by RT with success. Copious amount of tan thick secretions. Pts nose started bleeding and running down his face. RT will continue to monitor.

## 2021-07-31 NOTE — Progress Notes (Cosign Needed Addendum)
   Palliative Medicine Inpatient Follow Up Note  Palliative care has acknowledged the consult for [name].   As of presently we are trying to identify a next of kin or primary decision maker given patients encephalopathic state.   Zayde's only contact which is working is that of [employer] who appears to be a Ship broker in [place], their relationship is unknown. Multiple VMs have been left.  A prior contact - roommate Emeline Darling has a number which is no longer in service --> [phone]   I have spoken to the MSW, Milas Gain to see if there are any additional insights from her perspective and to continue searching for a next of kin.  I have spoken to the Palliative Medical Director, Rhea Pink regarding this patients case as it is possible the medical team will be faced with decisions on whether or not to intubate or resuscitate in the near future. I worry that in Nilo's present state and his chronic co-morbid conditions that these interventions may not improve his long term quality of life. For the time being the primary medical team will allow Neill more time in the hope that he can participate in additional decision making moving forward.   Palliative care will continue to offer ongoing support.   No Charge ______________________________________________________________________________________ [place] Team Team Cell Phone: [phone] Please utilize secure chat with additional questions, if there is no response within 30 minutes please call the above phone number  Palliative Medicine Team providers are available by phone from 7am to 7pm daily and can be reached through the team cell phone.  Should this patient require assistance outside of these hours, please call the patient's attending physician.

## 2021-07-31 NOTE — Progress Notes (Signed)
Pharmacy Antibiotic Note  Austin Cobb is a 61 y.o. male admitted on 07/27/2021 with CP/ light-headedness and possible alcohol withdrawal. Now with concern for PNA or possible bronchitis. Of note, patient has very poor dentition. Pharmacy has been consulted for vancomycin and cefepime dosing.  5/23: Tm 101.1, WBC 13.3>15.3, CXR clear, with abundant GPS and few GNRs noted on TA   Plan: LOAD Vancomycin 1,000 mg x1  FOLLOWED by Vancomycin 750 mg IV Q 24 hrs (eAUC 485, Scr used 1.2, using TBW) START Cefepime 2g IV Q12H  Monitor Renal function, F/U C/S, fever curve, WBC, Procalcitonin, de-escalation   Height: '5\' 9"'$  (175.3 cm) Weight: 50.7 kg (111 lb 12.4 oz) IBW/kg (Calculated) : 70.7  Temp (24hrs), Avg:99 F (37.2 C), Min:97.7 F (36.5 C), Max:101.1 F (38.4 C)  Recent Labs  Lab 07/25/21 0658 07/27/21 0656 07/28/21 0620 07/28/21 1551 07/28/21 1838 07/29/21 0145 07/30/21 0114 07/31/21 0140  WBC 6.2 8.5  --   --   --  9.0 13.3* 15.3*  CREATININE 1.28* 1.24 1.07  --   --  1.25* 1.14 1.20  LATICACIDVEN  --   --   --  1.2 1.0  --   --   --     Estimated Creatinine Clearance: 45.8 mL/min (by C-G formula based on SCr of 1.2 mg/dL).    Allergies  Allergen Reactions   Adhesive [Tape] Other (See Comments)    Heat and EKG pads flare pre-existing eczema    Antimicrobials this admission: Vanc 5/23>> Cefepime 5/23>>  Dose adjustments this admission: N/A  Microbiology results: 5/14 Ucx: insignificant growth  5/22 MRSA PCR: detected 5/22 TA: abundant GPCs, few GNRs 5/23 Bcx: sent    Adria Dill, PharmD PGY-1 Acute Care Resident  07/31/2021 10:08 AM

## 2021-07-31 NOTE — TOC Benefit Eligibility Note (Signed)
Patient Teacher, English as a foreign language completed.    The patient is currently admitted and upon discharge could be taking Entresto 24-26 mg.  The current 30 day co-pay is, $297.00 due to a $250.00 deductible.  Will be $47.00 once deductible is met.   The patient is insured through [employer], [place] Patient Advocate Specialist [facility] Patient Advocate Team Direct Number: [phone]  Fax: [phone]

## 2021-07-31 NOTE — Progress Notes (Signed)
RT NTS pt x1. Copious tan secretions. RT will continue to monitor.

## 2021-08-01 ENCOUNTER — Inpatient Hospital Stay (HOSPITAL_COMMUNITY): Payer: Medicare (Managed Care)

## 2021-08-01 DIAGNOSIS — J9601 Acute respiratory failure with hypoxia: Secondary | ICD-10-CM | POA: Diagnosis not present

## 2021-08-01 DIAGNOSIS — Z8701 Personal history of pneumonia (recurrent): Secondary | ICD-10-CM

## 2021-08-01 DIAGNOSIS — R0789 Other chest pain: Secondary | ICD-10-CM

## 2021-08-01 DIAGNOSIS — Z515 Encounter for palliative care: Secondary | ICD-10-CM | POA: Diagnosis not present

## 2021-08-01 DIAGNOSIS — F10931 Alcohol use, unspecified with withdrawal delirium: Secondary | ICD-10-CM | POA: Diagnosis not present

## 2021-08-01 DIAGNOSIS — Z7189 Other specified counseling: Secondary | ICD-10-CM

## 2021-08-01 LAB — POCT I-STAT 7, (LYTES, BLD GAS, ICA,H+H)
Acid-Base Excess: 4 mmol/L — ABNORMAL HIGH (ref 0.0–2.0)
Bicarbonate: 27.4 mmol/L (ref 20.0–28.0)
Calcium, Ion: 1.11 mmol/L — ABNORMAL LOW (ref 1.15–1.40)
HCT: 31 % — ABNORMAL LOW (ref 39.0–52.0)
Hemoglobin: 10.5 g/dL — ABNORMAL LOW (ref 13.0–17.0)
O2 Saturation: 100 %
Patient temperature: 98.9
Potassium: 4.6 mmol/L (ref 3.5–5.1)
Sodium: 141 mmol/L (ref 135–145)
TCO2: 28 mmol/L (ref 22–32)
pCO2 arterial: 35.3 mmHg (ref 32–48)
pH, Arterial: 7.498 — ABNORMAL HIGH (ref 7.35–7.45)
pO2, Arterial: 158 mmHg — ABNORMAL HIGH (ref 83–108)

## 2021-08-01 LAB — GLUCOSE, CAPILLARY
Glucose-Capillary: 108 mg/dL — ABNORMAL HIGH (ref 70–99)
Glucose-Capillary: 100 mg/dL — ABNORMAL HIGH (ref 70–99)
Glucose-Capillary: 80 mg/dL (ref 70–99)
Glucose-Capillary: 102 mg/dL — ABNORMAL HIGH (ref 70–99)
Glucose-Capillary: 124 mg/dL — ABNORMAL HIGH (ref 70–99)
Glucose-Capillary: 120 mg/dL — ABNORMAL HIGH (ref 70–99)

## 2021-08-01 LAB — BASIC METABOLIC PANEL
Anion gap: 4 — ABNORMAL LOW (ref 5–15)
BUN: 21 mg/dL (ref 8–23)
CO2: 28 mmol/L (ref 22–32)
Calcium: 8.1 mg/dL — ABNORMAL LOW (ref 8.9–10.3)
Chloride: 109 mmol/L (ref 98–111)
Creatinine, Ser: 1.13 mg/dL (ref 0.61–1.24)
GFR, Estimated: >60 mL/min (ref >60)
Glucose, Bld: 134 mg/dL — ABNORMAL HIGH (ref 70–99)
Potassium: 4.7 mmol/L (ref 3.5–5.1)
Sodium: 141 mmol/L (ref 135–145)

## 2021-08-01 LAB — CBC
HCT: 31.3 % — ABNORMAL LOW (ref 39.0–52.0)
Hemoglobin: 10.3 g/dL — ABNORMAL LOW (ref 13.0–17.0)
MCH: 31.6 pg (ref 26.0–34.0)
MCHC: 32.9 g/dL (ref 30.0–36.0)
MCV: 96 fL (ref 80.0–100.0)
Platelets: 216 10*3/uL (ref 150–400)
RBC: 3.26 MIL/uL — ABNORMAL LOW (ref 4.22–5.81)
RDW: 14.7 % (ref 11.5–15.5)
WBC: 13.1 10*3/uL — ABNORMAL HIGH (ref 4.0–10.5)
nRBC: 0 % (ref 0.0–0.2)

## 2021-08-01 LAB — PHOSPHORUS: Phosphorus: 2.5 mg/dL (ref 2.5–4.6)

## 2021-08-01 LAB — PROCALCITONIN: Procalcitonin: 0.11 ng/mL

## 2021-08-01 LAB — MAGNESIUM: Magnesium: 2.3 mg/dL (ref 1.7–2.4)

## 2021-08-01 MED ORDER — METOPROLOL SUCCINATE ER 25 MG PO TB24: 25.0000 mg | ORAL_TABLET | Freq: Every day | ORAL | Status: DC

## 2021-08-01 MED ORDER — FENTANYL 2500MCG IN NS 250ML (10MCG/ML) PREMIX INFUSION
50.0000 ug/h | INTRAVENOUS | Status: DC
  Administered 2021-08-02: 100 ug/h via INTRAVENOUS
  Administered 2021-08-03: 150 ug/h via INTRAVENOUS
  Administered 2021-08-03: 130 ug/h via INTRAVENOUS
  Administered 2021-08-04: 200 ug/h via INTRAVENOUS
  Administered 2021-08-05: 150 ug/h via INTRAVENOUS
  Administered 2021-08-05: 200 ug/h via INTRAVENOUS
  Administered 2021-08-06: 50 ug/h via INTRAVENOUS
  Filled 2021-08-01 (×7): qty 250

## 2021-08-01 MED ORDER — DOCUSATE SODIUM 50 MG/5ML PO LIQD
100.0000 mg | Freq: Two times a day (BID) | ORAL | Status: DC
  Administered 2021-08-02 – 2021-08-06 (×6): 100 mg
  Filled 2021-08-01 (×6): qty 10

## 2021-08-01 MED ORDER — MIDAZOLAM HCL 2 MG/2ML IJ SOLN
INTRAMUSCULAR | Status: AC
  Administered 2021-08-01: 2 mg
  Filled 2021-08-01: qty 2

## 2021-08-01 MED ORDER — CHLORHEXIDINE GLUCONATE 0.12% ORAL RINSE (MEDLINE KIT)
15.0000 mL | Freq: Two times a day (BID) | OROMUCOSAL | Status: DC
  Administered 2021-08-01 – 2021-08-07 (×10): 15 mL via OROMUCOSAL

## 2021-08-01 MED ORDER — FENTANYL CITRATE PF 50 MCG/ML IJ SOSY: 50.0000 ug | PREFILLED_SYRINGE | Freq: Once | INTRAMUSCULAR | Status: AC

## 2021-08-01 MED ORDER — FENTANYL 2500MCG IN NS 250ML (10MCG/ML) PREMIX INFUSION
INTRAVENOUS | Status: AC
  Administered 2021-08-01: 50 ug/h via INTRAVENOUS
  Filled 2021-08-01: qty 250

## 2021-08-01 MED ORDER — SUCCINYLCHOLINE CHLORIDE 200 MG/10ML IV SOSY
PREFILLED_SYRINGE | INTRAVENOUS | Status: AC
  Filled 2021-08-01: qty 10

## 2021-08-01 MED ORDER — PROPOFOL 1000 MG/100ML IV EMUL
INTRAVENOUS | Status: AC
  Administered 2021-08-01: 5 ug/kg/min via INTRAVENOUS
  Filled 2021-08-01: qty 100

## 2021-08-01 MED ORDER — FENTANYL BOLUS VIA INFUSION
50.0000 ug | INTRAVENOUS | Status: DC | PRN
  Administered 2021-08-04 – 2021-08-06 (×4): 75 ug via INTRAVENOUS
  Administered 2021-08-07 (×2): 100 ug via INTRAVENOUS
  Administered 2021-08-07: 75 ug via INTRAVENOUS
  Administered 2021-08-07 (×2): 100 ug via INTRAVENOUS

## 2021-08-01 MED ORDER — THIAMINE HCL 100 MG/ML IJ SOLN
250.0000 mg | INTRAVENOUS | Status: AC
  Administered 2021-08-01 – 2021-08-03 (×3): 250 mg via INTRAVENOUS
  Filled 2021-08-01 (×4): qty 2.5

## 2021-08-01 MED ORDER — SODIUM CHLORIDE 3 % IN NEBU
4.0000 mL | INHALATION_SOLUTION | Freq: Two times a day (BID) | RESPIRATORY_TRACT | Status: AC
  Administered 2021-08-01 – 2021-08-04 (×6): 4 mL via RESPIRATORY_TRACT
  Filled 2021-08-01 (×6): qty 15

## 2021-08-01 MED ORDER — ORAL CARE MOUTH RINSE
15.0000 mL | OROMUCOSAL | Status: DC
  Administered 2021-08-01 – 2021-08-07 (×57): 15 mL via OROMUCOSAL

## 2021-08-01 MED ORDER — ROCURONIUM BROMIDE 10 MG/ML (PF) SYRINGE
PREFILLED_SYRINGE | INTRAVENOUS | Status: AC
  Filled 2021-08-01: qty 10

## 2021-08-01 MED ORDER — POLYETHYLENE GLYCOL 3350 17 G PO PACK
17.0000 g | PACK | Freq: Every day | ORAL | Status: DC
  Administered 2021-08-04: 17 g
  Filled 2021-08-01 (×2): qty 1

## 2021-08-01 MED ORDER — ETOMIDATE 2 MG/ML IV SOLN
INTRAVENOUS | Status: AC
  Administered 2021-08-01: 20 mg
  Filled 2021-08-01: qty 20

## 2021-08-01 MED ORDER — PROPOFOL 1000 MG/100ML IV EMUL
0.0000 ug/kg/min | INTRAVENOUS | Status: DC
  Administered 2021-08-01: 30 ug/kg/min via INTRAVENOUS
  Administered 2021-08-02: 10 ug/kg/min via INTRAVENOUS
  Administered 2021-08-03 (×2): 20 ug/kg/min via INTRAVENOUS
  Administered 2021-08-04: 30 ug/kg/min via INTRAVENOUS
  Administered 2021-08-04: 40 ug/kg/min via INTRAVENOUS
  Administered 2021-08-04: 50 ug/kg/min via INTRAVENOUS
  Administered 2021-08-05: 35 ug/kg/min via INTRAVENOUS
  Administered 2021-08-05: 50 ug/kg/min via INTRAVENOUS
  Administered 2021-08-05: 35 ug/kg/min via INTRAVENOUS
  Administered 2021-08-05: 50 ug/kg/min via INTRAVENOUS
  Administered 2021-08-06: 40 ug/kg/min via INTRAVENOUS
  Filled 2021-08-01 (×4): qty 100
  Filled 2021-08-01: qty 200
  Filled 2021-08-01 (×2): qty 100
  Filled 2021-08-01: qty 200
  Filled 2021-08-01 (×2): qty 100
  Filled 2021-08-01: qty 200
  Filled 2021-08-01: qty 100

## 2021-08-01 MED ORDER — FENTANYL CITRATE PF 50 MCG/ML IJ SOSY
PREFILLED_SYRINGE | INTRAMUSCULAR | Status: AC
  Administered 2021-08-01: 50 ug via INTRAVENOUS
  Filled 2021-08-01: qty 2

## 2021-08-01 MED ORDER — KETAMINE HCL 50 MG/5ML IJ SOSY
PREFILLED_SYRINGE | INTRAMUSCULAR | Status: AC
  Filled 2021-08-01: qty 5

## 2021-08-01 MED ORDER — METOPROLOL TARTRATE 25 MG/10 ML ORAL SUSPENSION
12.5000 mg | Freq: Two times a day (BID) | ORAL | Status: DC
  Administered 2021-08-01 – 2021-08-04 (×6): 12.5 mg
  Filled 2021-08-01 (×7): qty 10

## 2021-08-01 MED ORDER — METOPROLOL SUCCINATE ER 25 MG PO TB24
25.0000 mg | ORAL_TABLET | Freq: Every day | ORAL | Status: DC
Start: 2021-08-01 — End: 2021-08-01

## 2021-08-01 NOTE — Progress Notes (Signed)
Attempted to NTS pt and was unsuccessful. Pt fighting and asked [place] to leave him alone and quit messing with his nose. Secretions very thick and tan/yellow. RT will continue to monitor.

## 2021-08-01 NOTE — Progress Notes (Signed)
NAME:  Austin Cobb, MRN:  [id number], DOB:  06-02-1960, LOS: 21 ADMISSION DATE:  07/17/2021, CONSULTATION DATE:  07/28/21 REFERRING MD:  Pasty Arch, CHIEF COMPLAINT:  Cardiogenic shock   History of Present Illness:  Mr. Austin Cobb is a 61 y/o gentleman with a history of ETOH & tobacco abuse, HFrEF, CKD 3b, bipolar disorder who presented on 5/13 with CP, lightheadedness, n/v. He also has odynophagia with pain in his chest after swallowing. He had an episode of hematemesis the night before presentation.  He was found to have Aflutter with RVR and has been managed with beta blockers. Not felt to be a good candidate previously for DCCV due to esophageal pain and questionable compliance after discharge with DOAC. He underwent multiple cardioversions in 2019. He has been taken off Entresto due to AKI. He developed hypotension on the floor & he was moved to the ICU due to concern for need for vasopressors for decompensated heart failure.    He drinks 2 x 40 oz beers per day usually, more the few days PTA. Last drink 24 hrs before admission.  He was initially treated with a  librium taper + CIWA but has not needed benzodiazepines recently.  Pertinent  Medical History  ETOH abuse HFrEF Bipolar disorder GERD Tobacco abuse GERD  Significant Hospital Events: Including procedures, antibiotic start and stop dates in addition to other pertinent events   5/13 admitted 5/20 moved to ICU 5/21 Babbling nonsensically, moist speech/gurgling, mitts in place, refusing NTS 5/24 Continues to remain encephalopathic with thick upper airway secretions auscultated    Interim History / Subjective:  Spontaneously awake but unable to follow commands   Objective   Blood pressure (!) 122/96, pulse (!) 115, temperature 99.5 F (37.5 C), temperature source Oral, resp. rate 18, height '5\' 9"'$  (1.753 m), weight 59.2 kg, SpO2 100 %.        Intake/Output Summary (Last 24 hours) at 08/01/2021 1055 Last data filed at  08/01/2021 0800 Gross per 24 hour  Intake 2434.91 ml  Output 1150 ml  Net 1284.91 ml    Filed Weights   07/30/21 0500 07/31/21 0423 08/01/21 0500  Weight: 57.3 kg 50.7 kg 59.2 kg    Examination: General: Acute on chronically ill appearing middle aged male lying in bed, in NAD HEENT: [place]/AT, MM pink/moist, PERRL,  Neuro: Awake but speech is incompressible, unable to follow commands   CV: s1s2 regular rate and rhythm, no murmur, rubs, or gallops,  PULM:  Upper airway secretions, on [place] with no increased work of breathing  GI: soft, bowel sounds active in all 4 quadrants, non-tender, non-distended, tolerating TF via cortrack  Extremities: warm/dry, no edema  Skin: no rashes or lesions  Resolved Hospital Problem list   AKI on CKD 3b Hypernatremia, Hypokalemia, Hypomagnesemia   Assessment & Plan:  At Risk Respiratory Failure  GNR, GPC's in Sputum, ? Tracheobronchitis  -In setting of poor secretion clearance, general deconditioning / weakness  P: Continue chest PT and NTS as needed  Add hypertonic nebs  Wean PEEP and FiO2 for sats greater than 90%. Head of bed elevated 30 degrees Follow intermittent chest x-ray and ABG Ensure adequate pulmonary hygiene  Stop Scop patch  De-escalate antibiotics to PO Augmentin   Acute on chronic HFrEF due to tachyarrhythmia A-flutter with RVR Elevated Troponin 2/2 Myocardial Injury due to AFlutter P: Cardiology following appreciate assistance  Continue IV amiodarone and PO lopressor  GDMT as tolerated  Continue Eliquis and Farxiga  Continuous telemetry  ETOH  withdrawal with Dts Acute metabolic encephalopathy. P: Remains on Phenobarbital taper  CIWA protocol  Supplement thiamine, folate, and MV  PRN Ativan  Seizure precaution   Moderate protein energy malnutrition P: Continue TF via cortrack  CKD 3b P: Follow renal function  Monitor urine output Trend Bmet Avoid nephrotoxins Ensure adequate renal perfusion   Tobacco  abuse Polysubstance / ETOH Abuse  P: Continue nicotine patch  Cessation education when appropriate   GERD, possibly esophagitis P: Continue PPI and Carafate   Best Practice (right click and "Reselect all SmartList Selections" daily)  Diet/type: tubefeeds DVT prophylaxis: DOAC GI prophylaxis: PPI Lines: N/A Foley:  N/A Code Status:  full code Last date of multidisciplinary goals of care discussion: See IPAL note dictated 5/24   Critical care time: n/a  \Arrion Broaddus D. Kenton Kingfisher, NP-C [facility] Personal contact information can be found on Amion  08/01/2021, 11:55 AM

## 2021-08-01 NOTE — Procedures (Signed)
Intubation Procedure Note  Austin Cobb  [id number]  1960-10-28  Date:08/01/21  Time:4:23 PM   Provider Performing:Nekia Maxham C Tamala Julian    Procedure: Intubation (34193)  Indication(s) Respiratory Failure  Consent Unable to obtain consent due to emergent nature of procedure.   Anesthesia Etomidate, Versed, and Fentanyl   Time Out Verified patient identification, verified procedure, site/side was marked, verified correct patient position, special equipment/implants available, medications/allergies/relevant history reviewed, required imaging and test results available.   Sterile Technique Usual hand hygeine, masks, and gloves were used   Procedure Description Patient positioned in bed supine.  Sedation given as noted above.  Patient was intubated with endotracheal tube using Glidescope.  View was Grade 1 full glottis .  Number of attempts was 1.  Colorimetric CO2 detector was consistent with tracheal placement.   Complications/Tolerance None; patient tolerated the procedure well. Chest X-ray is ordered to verify placement.   EBL Minimal   Specimen(s) None

## 2021-08-01 NOTE — Progress Notes (Signed)
Patient ID: Austin Cobb, male   DOB: 31-Dec-1960, 61 y.o.   MRN: [id number]    Progress Note from the Palliative Medicine Team at [facility]   Patient Name: Austin Cobb        Date: 08/01/2021 DOB: 06-24-1960  Age: 61 y.o. MRN#: [id number] Attending Physician: Candee Furbish, MD Primary Care Physician: Patient, No Pcp Per (Inactive) Admit Date: 07/31/2021   Medical records reviewed   Per hospitalist note --> Mr. Vacha is a 61 y/o gentleman with a history of ETOH & tobacco abuse, HFrEF, CKD 3b, bipolar disorder who presented on 5/13 with CP, lightheadedness, n/v. He also has odynophagia with pain in his chest after swallowing. He had an episode of hematemesis the night before presentation.  He was found to have Aflutter with RVR and has been managed with beta blockers. Not felt to be a good candidate previously for DCCV due to esophageal pain and questionable compliance after discharge with DOAC. He underwent multiple cardioversions in 2019. He has been taken off Entresto due to AKI. He developed hypotension on the floor & he was moved to the ICU due to concern for need for vasopressors for decompensated heart failure.    Palliative care has been asked to get involved to further discuss goals of care.  This NP visited patient at the bedside as a follow up for palliative medicine needs and emotional support.   Patient is lethargic and appears generally uncomfortable secondary to respiratory distress.  Copious amounts of audible throat secretions, attempted to suction for small amount of return of gray thick secretions.  I spoke to his brother/ Lenon Ahmadi and offered education regarding current medical situation and his brother tenuous situation and high risk to decompensate to need of ventilator support.  I strongly encouraged his presence here as we are looking to him to make critical decisions for this patient.  Patient does not have medical decision making capacity at this  time.  Brother tells me at this point in time he would like "everything done"; they are open to all offered and available medical interventions to prolong life.  I then spoke to  his life long friend/ [employer] by telephone, with brothers permission.  Again education offered regarding current medical situation and high risk for decompensation.  Stressed the importance of establishing goals of care and end-of-life wishes.  [name] tells me that he is this patient's lifelong friend, knowing each other since middle school and that actually [name] was present for [employer] wedding.  They lost contact but reconnected approximately 10 years ago.  [name] is patient's main support person here locally.  Education offered today regarding  the importance of continued conversation with family and the medical providers regarding overall plan of care and treatment options,  ensuring decisions are within the context of the patients values and GOCs.  [name] agrees to meet me tomorrow morning at 11:00 for continued goals of care discussion.  Questions and concerns addressed   Discussed with Dr Tamala Julian and transition of care team   Wadie Lessen NP  Palliative Medicine Team Team Phone # [phone] Pager [phone]

## 2021-08-01 NOTE — Progress Notes (Addendum)
   Discussed case with Merlene Laughter, NP. Given patient will remain full code, with continued difficulty controlling secretions, patient to remain in the ICU under PCCM care for now. IMTS will be happy to resume care once patient is stable for transfer.   Of note, on discussion with Mr. Linarez this morning, he mentioned that he is very close with his siblings, but is closest with his brother, Elam Ellis. "We are as close as siblings can be."   Dr. Jose Persia Internal Medicine PGY-3 Pager: [phone] 08/01/2021, 11:22 AM

## 2021-08-01 NOTE — Progress Notes (Signed)
Decompensated further these afternoon. Now more obtunded.  Gurgling respirations in 40s. Family wants full code for now. Intubated and therapeutic/diagnostic bronch performed. Staph in previous culture, already on HCAP coverage. Continue [place] discussions.  31 min cc time separate from procedures Erskine Emery MD PCCM

## 2021-08-01 NOTE — Progress Notes (Signed)
Date and time results received: 08/01/21 1150 (use smartphrase ".now" to insert current time)  Test: CBG Critical Value: 63  Name of Provider Notified: Dr. Erskine Emery  Orders Received? Or Actions Taken?:   MD notified, will continue to monitor.

## 2021-08-01 NOTE — IPAL (Signed)
  Interdisciplinary Goals of Care Family Meeting   Date carried out:: 08/01/2021  Location of the meeting: Phone conference  Member's involved: Nurse Practitioner and Family Member or next of kin  Durable Power of Attorney or acting medical decision maker: Austin Cobb, Brother     Discussion: I called and spoke to patients brother Austin Cobb regarding Austin Cobb. Austin Cobb states that he is aware that Austin Cobb is sick and has made little to no progress over the last several days but he remains hopefully that Austin Cobb will recover. He states the Austin Cobb has stated to him in the past that he would want to "do everything to save me" and Austin Cobb wishes to continues this plan of care for now   Austin Cobb states that he has acted for Austin Cobb in the past and will continue to do so. He states that his sister Austin Cobb is involved and he keeps her updated but he has been the choose Austin Cobb for Austin Cobb   Code status: Full Code  Disposition: Continue current acute care   Time spent for the meeting: 35 min  Austin Cobb D. Harris 08/01/2021, 12:16 PM

## 2021-08-01 NOTE — Procedures (Signed)
Bronchoscopy Procedure Note  TYKWON FERA  [id number]  [birth year]  Date:08/01/21  Time:4:24 PM   Provider Performing:Demeka Sutter C Tamala Julian   Procedure(s):  Flexible bronchoscopy with bronchial alveolar lavage [id number])  Indication(s) Pneumonia  Consent Emergent  Anesthesia In place for intubation   Time Out Verified patient identification, verified procedure, site/side was marked, verified correct patient position, special equipment/implants available, medications/allergies/relevant history reviewed, required imaging and test results available.   Sterile Technique Usual hand hygiene, masks, gowns, and gloves were used   Procedure Description Bronchoscope advanced through endotracheal tube and into airway.  Airways were examined down to subsegmental level with findings noted below.   Following diagnostic evaluation, BAL(s) performed in LUL with normal saline and return of purulent fluid  Findings:  Severe mucopurulent secretions throughout Underlying mucosa red and edematous   Complications/Tolerance None; patient tolerated the procedure well. Chest X-ray is needed post procedure.   EBL Minimal   Specimen(s) BAL LUL

## 2021-08-01 NOTE — Progress Notes (Signed)
Progress Note  Patient Name: Austin Cobb Date of Encounter: 08/01/2021  [facility] Cardiologist: Werner Lean, MD   Subjective   Confused  Inpatient Medications    Scheduled Meds:  apixaban  5 mg Per Tube BID   chlorhexidine  15 mL Mouth Rinse BID   Chlorhexidine Gluconate Cloth  6 each Topical Q0600   feeding supplement (PROSource TF)  45 mL Per Tube Daily   folic acid  1 mg Intravenous Daily   free water  100 mL Per Tube Q6H   glycopyrrolate  0.1 mg Intravenous Q8H   guaiFENesin  15 mL Per Tube Q6H   mouth rinse  15 mL Mouth Rinse q12n4p   metoprolol succinate  25 mg Oral Daily   multivitamin with minerals  1 tablet Per Tube Daily   mupirocin ointment  1 application. Nasal BID   nicotine  14 mg Transdermal Daily   pantoprazole sodium  40 mg Per Tube BID   PHENObarbital  65 mg Intravenous Q8H   Followed by   PHENObarbital  32.5 mg Intravenous Q8H   sucralfate  1 g Per Tube TID WC & HS   thiamine  100 mg Per Tube Daily   Continuous Infusions:  sodium chloride Stopped (07/28/21 1444)   amiodarone 30 mg/hr (08/01/21 0700)   ceFEPime (MAXIPIME) IV 2 g (08/01/21 0954)   feeding supplement (OSMOLITE 1.5 CAL) 1,000 mL (07/30/21 0011)   vancomycin     PRN Meds: acetaminophen (TYLENOL) oral liquid 160 mg/5 mL **OR** acetaminophen, LORazepam, ondansetron **OR** ondansetron (ZOFRAN) IV   Vital Signs    Vitals:   08/01/21 0800 08/01/21 0810 08/01/21 0900 08/01/21 1000  BP: 133/89  97/78 (!) 122/96  Pulse: (!) 121  (!) 114 (!) 115  Resp: 16  (!) 22 18  Temp:  99.5 F (37.5 C)    TempSrc:  Oral    SpO2: 95%  96% 100%  Weight:      Height:        Intake/Output Summary (Last 24 hours) at 08/01/2021 1030 Last data filed at 08/01/2021 0800 Gross per 24 hour  Intake 2434.91 ml  Output 1150 ml  Net 1284.91 ml      08/01/2021    5:00 AM 07/31/2021    4:23 AM 07/30/2021    5:00 AM  Last 3 Weights  Weight (lbs) 130 lb 8.2 oz 111 lb 12.4 oz 126 lb  5.2 oz  Weight (kg) 59.2 kg 50.7 kg 57.3 kg      Telemetry    AFLUTTER 120's 2:1 - Personally Reviewed ersonally Reviewed  Physical Exam   Delirious  Labs    High Sensitivity Troponin:   Recent Labs  Lab 07/14/2021 0928 07/18/2021 1131  TROPONINIHS 45* 38*     Chemistry Recent Labs  Lab 07/27/21 0656 07/28/21 0620 07/29/21 0145 07/30/21 0114 07/31/21 0140 08/01/21 0202  NA 143 145   < > 147* 144 141  K 3.9 3.8   < > 4.1 3.8 4.7  CL 113* 115*   < > 115* 111 109  CO2 24 21*   < > '26 27 28  '$ GLUCOSE 111* 116*   < > 119* 113* 134*  BUN 18 20   < > '21 20 21  '$ CREATININE 1.24 1.07   < > 1.14 1.20 1.13  CALCIUM 8.3* 8.3*   < > 8.2* 8.1* 8.1*  MG  --  1.7   < > 1.8 1.9 2.3  PROT 6.2* 6.5  --   --   --   --  ALBUMIN 2.6* 2.7*  --   --   --   --   AST 17 17  --   --   --   --   ALT 12 11  --   --   --   --   ALKPHOS 45 46  --   --   --   --   BILITOT 0.7 0.4  --   --   --   --   GFRNONAA >60 >60   < > >60 >60 >60  ANIONGAP 6 9   < > 6 6 4*   < > = values in this interval not displayed.    Lipids No results for input(s): CHOL, TRIG, HDL, LABVLDL, LDLCALC, CHOLHDL in the last 168 hours.  Hematology Recent Labs  Lab 07/30/21 0114 07/31/21 0140 08/01/21 0202  WBC 13.3* 15.3* 13.1*  RBC 3.21* 3.38* 3.26*  HGB 9.9* 10.7* 10.3*  HCT 30.4* 32.0* 31.3*  MCV 94.7 94.7 96.0  MCH 30.8 31.7 31.6  MCHC 32.6 33.4 32.9  RDW 14.6 14.8 14.7  PLT 237 232 216   Thyroid No results for input(s): TSH, FREET4 in the last 168 hours.  BNP Recent Labs  Lab 07/28/21 1551  BNP 1,220.8*    DDimer No results for input(s): DDIMER in the last 168 hours.   Radiology    DG CHEST PORT 1 VIEW  Result Date: 08/01/2021 CLINICAL DATA:  Pneumonia. EXAM: PORTABLE CHEST 1 VIEW COMPARISON:  Jul 31, 2021. FINDINGS: Stable cardiomegaly. Feeding tube is seen entering stomach. Lungs are clear. Bony thorax is unremarkable. IMPRESSION: No active disease. Electronically Signed   By: Marijo Conception  M.D.   On: 08/01/2021 08:06   DG CHEST PORT 1 VIEW  Result Date: 07/31/2021 CLINICAL DATA:  Alcohol withdrawal EXAM: PORTABLE CHEST 1 VIEW COMPARISON:  07/29/2021 FINDINGS: Feeding tube extends the stomach. Stable cardiac silhouette. Lungs are clear. No pneumothorax. IMPRESSION: No acute cardiopulmonary process. Electronically Signed   By: Suzy Bouchard M.D.   On: 07/31/2021 08:22    Cardiac Studies   Echo EF less than 20%.  Patient Profile     61 y.o. male with severe cardiomyopathy, likely tachycardia induced atrial flutter 2:1 conduction, prior multiple cardioversions.  Assessment & Plan    -Continue with amiodarone.  Hopeful chemical conversion at some point.  -Continue with Eliquis 5 mg twice a day.   Continued delirium perhaps mildly improved -Blood pressure would not tolerate ARB/ARN I/spironolactone or other goal-directed medical therapy.  We will try Toprol-XL 25 mg which may not have quite a blood pressure response as carvedilol.  Note, he was cocaine positive on admission several days ago.  There is risk involved with potential for vasospasm if cocaine is utilized again. -On the whole atrial flutter is very challenging to rate control.  Unfortunately, he is at high risk for both morbidity and mortality   For questions or updates, please contact [place] Please consult [email or web address] for contact info under        Signed, Candee Furbish, MD  08/01/2021, 10:30 AM

## 2021-08-02 ENCOUNTER — Inpatient Hospital Stay (HOSPITAL_COMMUNITY): Payer: Medicare (Managed Care)

## 2021-08-02 DIAGNOSIS — Z7189 Other specified counseling: Secondary | ICD-10-CM | POA: Diagnosis not present

## 2021-08-02 DIAGNOSIS — Z515 Encounter for palliative care: Secondary | ICD-10-CM | POA: Diagnosis not present

## 2021-08-02 DIAGNOSIS — F10931 Alcohol use, unspecified with withdrawal delirium: Secondary | ICD-10-CM | POA: Diagnosis not present

## 2021-08-02 DIAGNOSIS — Z978 Presence of other specified devices: Secondary | ICD-10-CM | POA: Diagnosis not present

## 2021-08-02 DIAGNOSIS — J9601 Acute respiratory failure with hypoxia: Secondary | ICD-10-CM | POA: Diagnosis not present

## 2021-08-02 LAB — BASIC METABOLIC PANEL
Anion gap: 7 (ref 5–15)
BUN: 24 mg/dL — ABNORMAL HIGH (ref 8–23)
CO2: 24 mmol/L (ref 22–32)
Calcium: 7.9 mg/dL — ABNORMAL LOW (ref 8.9–10.3)
Chloride: 109 mmol/L (ref 98–111)
Creatinine, Ser: 0.96 mg/dL (ref 0.61–1.24)
GFR, Estimated: >60 mL/min (ref >60)
Glucose, Bld: 129 mg/dL — ABNORMAL HIGH (ref 70–99)
Potassium: 3.9 mmol/L (ref 3.5–5.1)
Sodium: 140 mmol/L (ref 135–145)

## 2021-08-02 LAB — CBC
HCT: 31.3 % — ABNORMAL LOW (ref 39.0–52.0)
Hemoglobin: 10.2 g/dL — ABNORMAL LOW (ref 13.0–17.0)
MCH: 34 pg (ref 26.0–34.0)
MCHC: 32.6 g/dL (ref 30.0–36.0)
MCV: 104.3 fL — ABNORMAL HIGH (ref 80.0–100.0)
Platelets: 165 10*3/uL (ref 150–400)
RBC: 3 MIL/uL — ABNORMAL LOW (ref 4.22–5.81)
RDW: 16.1 % — ABNORMAL HIGH (ref 11.5–15.5)
WBC: 8.3 10*3/uL (ref 4.0–10.5)
nRBC: 0 % (ref 0.0–0.2)

## 2021-08-02 LAB — GLUCOSE, CAPILLARY
Glucose-Capillary: 110 mg/dL — ABNORMAL HIGH (ref 70–99)
Glucose-Capillary: 111 mg/dL — ABNORMAL HIGH (ref 70–99)
Glucose-Capillary: 129 mg/dL — ABNORMAL HIGH (ref 70–99)
Glucose-Capillary: 50 mg/dL — ABNORMAL LOW (ref 70–99)
Glucose-Capillary: 70 mg/dL (ref 70–99)
Glucose-Capillary: 70 mg/dL (ref 70–99)
Glucose-Capillary: 70 mg/dL (ref 70–99)
Glucose-Capillary: 73 mg/dL (ref 70–99)

## 2021-08-02 LAB — CULTURE, RESPIRATORY W GRAM STAIN

## 2021-08-02 LAB — POCT I-STAT 7, (LYTES, BLD GAS, ICA,H+H)
Acid-Base Excess: 3 mmol/L — ABNORMAL HIGH (ref 0.0–2.0)
Bicarbonate: 28.5 mmol/L — ABNORMAL HIGH (ref 20.0–28.0)
Calcium, Ion: 1.16 mmol/L (ref 1.15–1.40)
HCT: 29 % — ABNORMAL LOW (ref 39.0–52.0)
Hemoglobin: 9.9 g/dL — ABNORMAL LOW (ref 13.0–17.0)
O2 Saturation: 99 %
Patient temperature: 36.7
Potassium: 3.9 mmol/L (ref 3.5–5.1)
Sodium: 141 mmol/L (ref 135–145)
TCO2: 30 mmol/L (ref 22–32)
pCO2 arterial: 47.5 mmHg (ref 32–48)
pH, Arterial: 7.384 (ref 7.35–7.45)
pO2, Arterial: 132 mmHg — ABNORMAL HIGH (ref 83–108)

## 2021-08-02 LAB — PROCALCITONIN: Procalcitonin: 0.16 ng/mL

## 2021-08-02 LAB — MAGNESIUM: Magnesium: 2.1 mg/dL (ref 1.7–2.4)

## 2021-08-02 LAB — TRIGLYCERIDES: Triglycerides: 51 mg/dL (ref <150)

## 2021-08-02 LAB — C-REACTIVE PROTEIN: CRP: 19.6 mg/dL — ABNORMAL HIGH (ref <1.0)

## 2021-08-02 MED ORDER — NIACIN 100 MG PO TABS
200.0000 mg | ORAL_TABLET | Freq: Two times a day (BID) | ORAL | Status: DC
  Administered 2021-08-02 – 2021-08-07 (×10): 200 mg
  Filled 2021-08-02 (×11): qty 2

## 2021-08-02 MED ORDER — ASCORBIC ACID 500 MG PO TABS
500.0000 mg | ORAL_TABLET | Freq: Two times a day (BID) | ORAL | Status: DC
  Administered 2021-08-02 – 2021-08-07 (×10): 500 mg
  Filled 2021-08-02 (×10): qty 1

## 2021-08-02 MED ORDER — DEXMEDETOMIDINE HCL IN NACL 400 MCG/100ML IV SOLN
0.4000 ug/kg/h | INTRAVENOUS | Status: DC
  Administered 2021-08-02: 0.4 ug/kg/h via INTRAVENOUS
  Administered 2021-08-02 – 2021-08-03 (×4): 0.9 ug/kg/h via INTRAVENOUS
  Administered 2021-08-04: 0.8 ug/kg/h via INTRAVENOUS
  Filled 2021-08-02 (×7): qty 100

## 2021-08-02 MED ORDER — LINEZOLID 600 MG PO TABS
600.0000 mg | ORAL_TABLET | Freq: Two times a day (BID) | ORAL | Status: DC
  Filled 2021-08-02: qty 1

## 2021-08-02 MED ORDER — PROSOURCE TF PO LIQD
45.0000 mL | Freq: Two times a day (BID) | ORAL | Status: DC
  Administered 2021-08-02 – 2021-08-07 (×9): 45 mL
  Filled 2021-08-02 (×10): qty 45

## 2021-08-02 MED ORDER — SODIUM CHLORIDE 0.9 % IV BOLUS
250.0000 mL | Freq: Once | INTRAVENOUS | Status: AC
  Administered 2021-08-03: 250 mL via INTRAVENOUS

## 2021-08-02 MED ORDER — LINEZOLID 600 MG PO TABS
600.0000 mg | ORAL_TABLET | Freq: Two times a day (BID) | ORAL | Status: AC
  Administered 2021-08-02 – 2021-08-06 (×10): 600 mg
  Filled 2021-08-02 (×10): qty 1

## 2021-08-02 MED ORDER — VITAL 1.5 CAL PO LIQD
1000.0000 mL | ORAL | Status: DC
Start: 2021-08-02 — End: 2021-08-07
  Administered 2021-08-02 – 2021-08-07 (×5): 1000 mL

## 2021-08-02 NOTE — Progress Notes (Signed)
   Patient reintubated yesterday after worsening obtundation with respirations in the 40 gurgling confusion increased.  Family had expressed wishes for full code now.  Continues to maintain 2-1 atrial flutter for the most part with heart rates in the 120s.  Continuing with amiodarone with hopes of potential chemical conversion.  Ejection fraction severely reduced echocardiogram personally reviewed. Creatinine 0.96 potassium 3.9 procalcitonin normal at 0.16 hemoglobin 10.2.  Chest x-ray personally reviewed demonstrates ET tube in place, no significant pulmonary vascular congestion.  61 year old with delirium in the setting of alcohol withdrawal, polysubstance abuse, positive cocaine on admission with history of paroxysmal atrial flutter requiring prior/multiple cardioversions in the past currently on IV amiodarone, Eliquis, low-dose metoprolol.  Continues to be at extremely high risk of future morbidity and mortality.  Discussed with Dr. Tamala Julian, critical care team.  CRITICAL CARE Performed by: Candee Furbish   Total critical care time: 31 minutes  Critical care time was exclusive of separately billable procedures and treating other patients.  Critical care was necessary to treat or prevent imminent or life-threatening deterioration.  Critical care was time spent personally by me on the following activities: development of treatment plan with patient and/or surrogate as well as nursing, discussions with consultants, evaluation of patient's response to treatment, examination of patient, obtaining history from patient or surrogate, ordering and performing treatments and interventions, ordering and review of laboratory studies, ordering and review of radiographic studies, pulse oximetry and re-evaluation of patient's condition.

## 2021-08-02 NOTE — TOC Initial Note (Signed)
Transition of Care [facility]) - Initial/Assessment Note    Patient Details  Name: Austin Cobb MRN: [id number] Date of Birth: 27-Mar-1960  [facility]) CM/SW Contact:    Milas Gain, [place] Phone Number: 08/02/2021, 4:21 PM  Clinical Narrative:                  CSW acknowledges consult . CSW unable to complete consult/assessment at this time. Patient intubated. TOC will continue to follow.       Patient Goals and CMS Choice        Expected Discharge Plan and Services   In-house Referral: Clinical Social Work Discharge Planning Services: CM Consult   Living arrangements for the past 2 months: Mobile Home                                      Prior Living Arrangements/Services Living arrangements for the past 2 months: Mobile Home Lives with:: Self                   Activities of Daily Living Home Assistive Devices/Equipment: None ADL Screening (condition at time of admission) Patient's cognitive ability adequate to safely complete daily activities?: Yes Is the patient deaf or have difficulty hearing?: No Does the patient have difficulty seeing, even when wearing glasses/contacts?: No Does the patient have difficulty concentrating, remembering, or making decisions?: No Patient able to express need for assistance with ADLs?: Yes Does the patient have difficulty dressing or bathing?: No Independently performs ADLs?: No Communication: Independent Dressing (OT): Independent Grooming: Independent Feeding: Independent Bathing: Independent Toileting: Independent In/Out Bed: Independent Walks in Home: Independent Does the patient have difficulty walking or climbing stairs?: No Weakness of Legs: None Weakness of Arms/Hands: Both  Permission Sought/Granted                  Emotional Assessment              Admission diagnosis:  Atypical chest pain [R07.89] AKI (acute kidney injury) ([place]) [N17.9] Alcohol withdrawal ([place])  [F10.939] Patient Active Problem List   Diagnosis Date Noted   Atypical chest pain    Other dysphagia    Hypokalemia 07/23/2021   Atrial flutter, unspecified type ([place]) 10/03/2020   Alcohol use disorder, severe, dependence ([employer]) 08/11/2020   Cocaine use disorder ([place]) 08/11/2020   Atrial flutter ([place]) 07/31/2020   Heparin induced thrombocytopenia ([place]) 05/25/2019   Protein-calorie malnutrition, severe 05/20/2019   Yeast UTI 05/15/2019   Alcohol withdrawal ([place]) 83/66/2947   Acute metabolic encephalopathy 65/46/5035   AKI (acute kidney injury) ([place]) 05/14/2019   Demand ischemia ([place]) 05/14/2019   Pleural effusion 05/14/2019   Polysubstance abuse ([place]) 05/14/2019   E. coli UTI 05/14/2019   Agitation 05/14/2019   Noncompliance 05/14/2019   Chronic combined systolic and diastolic heart failure ([place])    Atrial flutter with rapid ventricular response ([place]) 05/04/2019   Essential hypertension    Herpes zoster without complication 46/56/8127   History of fall 07/23/2017   Prostatitis 06/23/2014   Serrated adenoma of colon 05/13/2012   Anal fissure 05/13/2012   Tobacco abuse 05/13/2012   Chronic shoulder pain 01/03/2012   Neck pain, chronic 01/03/2012   Mood disorder ([place]) 01/03/2012   Depression with anxiety 01/03/2012   PCP:  Patient, No Pcp Per (Inactive) Pharmacy:   [facility], [address]  [place] [address] Phone: [phone] Fax: [phone]  [facility] [phone] [place], [place] - [address] [place] [address] Phone: [phone] Fax: [phone]  [facility] [phone] - [place], [address] [place] [address] Phone: [phone] Fax: [phone]  [facility] [address] [address] Phone: [phone] Fax:  [phone]     Social Determinants of Health (SDOH) Interventions    Readmission Risk Interventions    08/01/2021   11:21 AM 12/27/2019    4:31 PM  Readmission Risk Prevention Plan  Transportation Screening  Complete  PCP or Specialist Appt within 3-5 Days  Complete  HRI or [place] Complete Complete  Social Work Consult for [place] Planning/Counseling Complete Complete  Palliative Care Screening Complete Not Applicable  Medication Review [employer]) Complete Complete

## 2021-08-02 NOTE — Progress Notes (Signed)
[facility] Progress Note Patient Name: JAYSTEN ESSNER DOB: 03/03/1961 MRN: [id number]   Date of Service  08/02/2021  HPI/Events of Note  Hypotension  Fever - BP = 83/53 with MAP = 62. LVEF < 20%.  eICU Interventions  Plan: Cooling blanket PRN. Bolus with 0.9 NaCl 250 mL IV over 30 minutes now.  Phenylephrine IV infusion via PIV. Titrate to MAP > 65.     Intervention Category Major Interventions: Hypotension - evaluation and management;Other:  Lysle Dingwall 08/02/2021, 11:57 PM

## 2021-08-02 NOTE — Progress Notes (Signed)
NAME:  Austin Cobb, MRN:  [id number], DOB:  12-Apr-1960, LOS: 60 ADMISSION DATE:  08/05/2021, CONSULTATION DATE:  07/28/21 REFERRING MD:  Pasty Arch, CHIEF COMPLAINT:  Cardiogenic shock   History of Present Illness:  Austin Cobb is a 61 y/o gentleman with a history of ETOH & tobacco abuse, HFrEF, CKD 3b, bipolar disorder who presented on 5/13 with CP, lightheadedness, n/v. He also has odynophagia with pain in his chest after swallowing. He had an episode of hematemesis the night before presentation.  He was found to have Aflutter with RVR and has been managed with beta blockers. Not felt to be a good candidate previously for DCCV due to esophageal pain and questionable compliance after discharge with DOAC. He underwent multiple cardioversions in 2019. He has been taken off Entresto due to AKI. He developed hypotension on the floor & he was moved to the ICU due to concern for need for vasopressors for decompensated heart failure.    He drinks 2 x 40 oz beers per day usually, more the few days PTA. Last drink 24 hrs before admission.  He was initially treated with a  librium taper + CIWA but has not needed benzodiazepines recently.  Pertinent  Medical History  ETOH abuse HFrEF Bipolar disorder GERD Tobacco abuse GERD  Significant Hospital Events: Including procedures, antibiotic start and stop dates in addition to other pertinent events   5/13 admitted 5/20 moved to ICU 5/21 Babbling nonsensically, moist speech/gurgling, mitts in place, refusing NTS 5/24 Continues to remain encephalopathic with thick upper airway secretions auscultated   5/25 Remains intubated with abundant MRSA on sputum culture   Interim History / Subjective:  Sedated on vent   Objective   Blood pressure 91/72, pulse (!) 116, temperature 98.8 F (37.1 C), resp. rate (!) 22, height '5\' 9"'$  (1.753 m), weight 57.8 kg, SpO2 100 %.    Vent Mode: PRVC FiO2 (%):  [30 %-100 %] 30 % Set Rate:  [16 bmp-18 bmp] 16 bmp Vt  Set:  [580 mL] 580 mL PEEP:  [5 cmH20] 5 cmH20 Plateau Pressure:  [16 cmH20-17 cmH20] 16 cmH20   Intake/Output Summary (Last 24 hours) at 08/02/2021 0724 Last data filed at 08/02/2021 0300 Gross per 24 hour  Intake 1935.71 ml  Output 1105 ml  Net 830.71 ml    Filed Weights   07/31/21 0423 08/01/21 0500 08/02/21 0500  Weight: 50.7 kg 59.2 kg 57.8 kg    Examination: General: Acute on chronically ill appearing frail middle aged male lying in bed on mechanical ventilation, in NAD HEENT: ETT, MM pink/moist, PERRL,  Neuro: Sedated on vent, seen spontaneously moving upper extremities  CV: s1s2 regular rate and rhythm, no murmur, rubs, or gallops,  PULM:  Clear to ascultation, no increased work of breathing, vent setting low  GI: soft, bowel sounds active in all 4 quadrants, non-tender, non-distended, tolerating TF Extremities: warm/dry, no edema  Skin: no rashes or lesions  Resolved Hospital Problem list   AKI on CKD 3b Hypernatremia, Hypokalemia, Hypomagnesemia   Assessment & Plan:  Acute Hypoxic Respiratory Failure -MRSA seen on respiratory culture, ? Colonization but secretions were significant during intubation   P: Continue ventilator support with lung protective strategies  Wean PEEP and FiO2 for sats greater than 90%. Head of bed elevated 30 degrees. Plateau pressures less than 30 cm H20.  Follow intermittent chest x-ray and ABG.   SAT/SBT as tolerated, mentation preclude extubation  Ensure adequate pulmonary hygiene  Follow cultures  VAP bundle in  place  PAD protocol Continue IV Vanc   Acute on chronic HFrEF due to tachyarrhythmia A-flutter with RVR Elevated Troponin 2/2 Myocardial Injury due to AFlutter P: Cardiology evaluated and continues to recommend medical management due to fragility GDMT as able  Remains on IV amio with PO lopressor  Continue Eliquis and Farxiga  Continuous telemetry    ETOH withdrawal with Dts Acute metabolic  encephalopathy. P: Remains on Phenobarbital taper CIWA protocl  Supplement thiamine, folate, and MVI PRN Ativan  Seizure precautions   Moderate protein energy malnutrition P: Continue TF via Cortrack   CKD 3b P: Follow renal function  Monitor urine output Trend Bmet Avoid nephrotoxins Ensure adequate renal perfusion   Tobacco abuse Polysubstance / ETOH Abuse  P: Continue nicotine patch   GERD, possibly esophagitis P: PPI and Carafate   Best Practice (right click and "Reselect all SmartList Selections" daily)  Diet/type: tubefeeds DVT prophylaxis: DOAC GI prophylaxis: PPI Lines: N/A Foley:  N/A Code Status:  full code Last date of multidisciplinary goals of care discussion: See IPAL note dictated 5/24   Critical care time: 39 min  \Keary Waterson D. Kenton Kingfisher, NP-C [facility] Personal contact information can be found on Amion  08/02/2021, 7:24 AM

## 2021-08-02 NOTE — Progress Notes (Addendum)
Patient ID: Austin Cobb, male   DOB: [birth year], 61 y.o.   MRN: [id number]    Progress Note from the Palliative Medicine Team at [facility]   Patient Name: Austin Cobb        Date: 08/02/2021 DOB: 24-Oct-1960  Age: 61 y.o. MRN#: [id number] Attending Physician: Candee Furbish, MD Primary Care Physician: Patient, No Pcp Per (Inactive) Admit Date: 07/22/2021   Medical records reviewed   Per hospitalist note --> Austin Cobb is a 61 y/o gentleman with a history of ETOH & tobacco abuse, HFrEF, CKD 3b, bipolar disorder who presented on 5/13 with CP, lightheadedness, n/v. He also has odynophagia with pain in his chest after swallowing. He had an episode of hematemesis the night before presentation.  He was found to have Aflutter with RVR and has been managed with beta blockers. Not felt to be a good candidate previously for DCCV due to esophageal pain and questionable compliance after discharge with DOAC. He underwent multiple cardioversions in 2019. He has been taken off Entresto due to AKI. He developed hypotension on the floor & he was moved to the ICU due to concern for need for vasopressors for decompensated heart failure.    Palliative care has been asked to get involved to further discuss goals of care.  This NP visited patient at the bedside as a follow up for palliative medicine needs and emotional support.   Unfortunately patient had further decline and was intubated yesterday.  I met today as scheduled with patient's friend Austin Cobb hoping to uncover insight into patient's values and goals of care.  Austin Cobb actually is the patient's "representative payee", Austin Cobb is a Nurse, adult here in [place]   Many years ago patient was found to not have the ability to manage his [place] stepped in to help.  He has been assisting pateint for about 10 years.   Today he was able to give some insight into the patient's history.  Currently patient  lives with a woman  named Austin Cobb, living together works for them as they both help each other survive financially and Austin Cobb also helps with Austin Cobb's physical needs.    Austin Cobb was aware that [name] continued  alcohol/ cigarette misuse but was unaware of any cocaine use.  There have been years psych-social issues.  Austin Cobb has had poor primary health care.    Education offered regarding current medical situation and high risk for decompensation.  Stressed the importance of establishing goals of care and end-of-life wishes.  Austin Cobb communicates intermittently with patient's brother Austin Cobb and feels that Austin Cobb is the person to make medical decisions for this patient.   However, Austin Cobb is willing to  be of assistance in any way that he can, and is willing to be participate in  a team meeting if we are able to do secure a meeting with the patient's brother by phone over the next several days.  According to [facility] " for what it is worth I do not think Austin Cobb would want all of this" ,   referring to current aggressive medical interventions  I called hoping to get Austin Cobb on the phone this morning along with Austin Cobb but unfortunately I was only able to leave a message and await callback.   I also left message to sister/Austin Cobb and await call back.   Discussed situation with Whitney NP/Cobb , I let her know that I would be out of the hospital until Monday morning.  She tells  me the critical care will continue to manage patient's care as well as  ongoing goals of care discussion and I will pick up again on Monday  In an attempt to not over communicate with brother, PMT will not engage until Monday unless requested specifically by Cobb.  Discussed with Austin Cobb and bedside RN   Late entry:  Patient's brother Austin Cobb returned my call.  Continued conversation regarding current medical situation and the patient's high risk for further decompensation in spite of full medical support. Brother verbalizes a  clear understanding of the situation, he understands that his brother's untreated mental health and poor lifestyle choices have impacted his current situation.  He believes that his brother would not want prolonged aggressive measures,  however at this time he would like to give it a few days to see if patient responds to treatment.  He wishes to continue with current medical interventions through the weekend and then talk with me again on the phone to further clarify goals of care.  Austin Lessen NP  Palliative Medicine Team Team Phone # [phone] Pager [phone]

## 2021-08-02 NOTE — Progress Notes (Signed)
Nutrition Follow-up  DOCUMENTATION CODES:   Severe malnutrition in context of social or environmental circumstances  INTERVENTION:   Tube Feeding via Cortrak:  Change to Vital 1.5 at 60 ml/hr Pro-Source TF 45 mL daily This provides 2240 kcals, 119 g of protein and 1094 mL of free water  Pt at high risk for niacin deficiency; plan to start niacin tablet 200 mg BID  Continue MVI with Minerals, thiamine and folic acid  Add Vitamin C 500 mg BID  NUTRITION DIAGNOSIS:   Severe Malnutrition related to social / environmental circumstances (alcohol use disorder) as evidenced by severe fat depletion, severe muscle depletion.  Being addressed via TF   GOAL:   Patient will meet greater than or equal to 90% of their needs  Met via TF   MONITOR:   PO intake, Supplement acceptance, Diet advancement  REASON FOR ASSESSMENT:   Consult Assessment of nutrition requirement/status, Poor PO  ASSESSMENT:   Austin Cobb admitted with chest pain, bloody emesis. PMH includes bipolar disorder, A flutter, CKD stage IIIb, CHF, cirrhosis, HIT, alcohol use disorder.  5/14 Admitted 5/19 Cortrak placed, TF initiated 5/24 Intubated, Bronch: severe mucopurulent secretions throughout Underlying mucosa red and edematous  Pt remains intubated, sedated on fentanyl, weaning propofol and adding precdex  Cortrak in place. Pt had been NPO and receiving TF since 5/19. Prior to this pt on CL and eating 0% of meals (from 5/14 until 5/19)   Osmolite 1.5 at 55 ml/hr, Pro-Source TF 45 mL daily via Cortrak  Current wt 57.8 kg; Admit weight 60 kg  Lots of liquid stools, rectal tube inserted  CBGs 70-129, no insulin. Pt with extremely pale nail beds, no capillary refill  Pt at high risk for niacin deficiency (pellagra-dermatitis, diarrhea, dementia) give medical hx of cirrhosis, EtOH/substance abuse in setting of severe malnutrition. Pt with dermatologic changes  (scaly patches) to skin in areas exposed to  neck (neck-casal's necklace, feet and lower legs, hands and arms). Unable to assess tongue. Pt is having loose stools, admitted with encephalopathy-MD suspecting pt near dementia.   Pt with poor dentition, bleeding around gums, unable to assess tongue  Labs: Creatinine wdl Meds: colace, folic acid thiamine, MVI with Minerals  Diet Order:   Diet Order             Diet NPO time specified  Diet effective now                   EDUCATION NEEDS:   Not appropriate for education at this time  Skin:  Skin Assessment: Skin Integrity Issues: Skin Integrity Issues:: Other (Comment) Other: possible pellagra: skin changes in sun exposed areas (neck , hads/arms, feet and LE below the knee), darkened, flaky, tough skin  Last BM:  5/12  Height:   Ht Readings from Last 1 Encounters:  07/09/2021 5' 9"  (1.753 m)    Weight:   Wt Readings from Last 1 Encounters:  08/02/21 57.8 kg    BMI:  Body mass index is 18.82 kg/m.  Estimated Nutritional Needs:   Kcal:  2000-2200 kcals  Protein:  110-130g  Fluid:  1.9-2.1 L   Kerman Passey MS, RDN, LDN, CNSC Registered Dietitian III Clinical Nutrition RD Pager and On-Call Pager Number Located in [place]

## 2021-08-03 ENCOUNTER — Inpatient Hospital Stay (HOSPITAL_COMMUNITY): Payer: Medicare (Managed Care)

## 2021-08-03 DIAGNOSIS — F10931 Alcohol use, unspecified with withdrawal delirium: Secondary | ICD-10-CM | POA: Diagnosis not present

## 2021-08-03 LAB — CULTURE, RESPIRATORY W GRAM STAIN

## 2021-08-03 LAB — GLUCOSE, CAPILLARY
Glucose-Capillary: 71 mg/dL (ref 70–99)
Glucose-Capillary: 71 mg/dL (ref 70–99)
Glucose-Capillary: 92 mg/dL (ref 70–99)
Glucose-Capillary: 103 mg/dL — ABNORMAL HIGH (ref 70–99)
Glucose-Capillary: 70 mg/dL (ref 70–99)

## 2021-08-03 LAB — BASIC METABOLIC PANEL
Anion gap: 10 (ref 5–15)
BUN: 21 mg/dL (ref 8–23)
CO2: 22 mmol/L (ref 22–32)
Calcium: 7.8 mg/dL — ABNORMAL LOW (ref 8.9–10.3)
Chloride: 106 mmol/L (ref 98–111)
Creatinine, Ser: 0.95 mg/dL (ref 0.61–1.24)
GFR, Estimated: >60 mL/min (ref >60)
Glucose, Bld: 112 mg/dL — ABNORMAL HIGH (ref 70–99)
Potassium: 4.9 mmol/L (ref 3.5–5.1)
Sodium: 138 mmol/L (ref 135–145)

## 2021-08-03 MED ORDER — ETOMIDATE 2 MG/ML IV SOLN
INTRAVENOUS | Status: AC
  Administered 2021-08-03: 10 mg
  Filled 2021-08-03: qty 20

## 2021-08-03 MED ORDER — MIDAZOLAM HCL 2 MG/2ML IJ SOLN
INTRAMUSCULAR | Status: AC
  Filled 2021-08-03: qty 2

## 2021-08-03 MED ORDER — SODIUM CHLORIDE 0.9 % IV SOLN: 250.0000 mL | INTRAVENOUS | Status: DC

## 2021-08-03 MED ORDER — CHLORHEXIDINE GLUCONATE CLOTH 2 % EX PADS
6.0000 | MEDICATED_PAD | Freq: Every day | CUTANEOUS | Status: DC
  Administered 2021-08-03 – 2021-08-07 (×4): 6 via TOPICAL

## 2021-08-03 MED ORDER — KETAMINE HCL 50 MG/5ML IJ SOSY
PREFILLED_SYRINGE | INTRAMUSCULAR | Status: AC
  Filled 2021-08-03: qty 5

## 2021-08-03 MED ORDER — SUCCINYLCHOLINE CHLORIDE 200 MG/10ML IV SOSY
PREFILLED_SYRINGE | INTRAVENOUS | Status: AC
  Filled 2021-08-03: qty 10

## 2021-08-03 MED ORDER — PHENYLEPHRINE HCL-NACL 20-0.9 MG/250ML-% IV SOLN: 25.0000 ug/min | INTRAVENOUS | Status: DC

## 2021-08-03 MED ORDER — ROCURONIUM BROMIDE 10 MG/ML (PF) SYRINGE
PREFILLED_SYRINGE | INTRAVENOUS | Status: AC
  Administered 2021-08-03: 50 mg
  Filled 2021-08-03: qty 10

## 2021-08-03 MED ORDER — FENTANYL CITRATE PF 50 MCG/ML IJ SOSY
PREFILLED_SYRINGE | INTRAMUSCULAR | Status: AC
  Filled 2021-08-03: qty 2

## 2021-08-03 NOTE — Plan of Care (Signed)
  Problem: Clinical Measurements: Goal: Will remain free from infection Outcome: Progressing   Problem: Clinical Measurements: Goal: Diagnostic test results will improve Outcome: Progressing   Problem: Clinical Measurements: Goal: Cardiovascular complication will be avoided Outcome: Progressing   Problem: Pain Managment: Goal: General experience of comfort will improve Outcome: Progressing

## 2021-08-03 NOTE — Progress Notes (Incomplete)
Progress Note  Patient Name: Austin Cobb Date of Encounter: 08/04/2021  [facility] Cardiologist: Werner Lean, MD   Subjective   Remains intubated.   HR 70-100 in Aflutter  Inpatient Medications    Scheduled Meds:  apixaban  5 mg Per Tube BID   vitamin C  500 mg Per Tube BID   chlorhexidine gluconate (MEDLINE KIT)  15 mL Mouth Rinse BID   Chlorhexidine Gluconate Cloth  6 each Topical Daily   docusate  100 mg Per Tube BID   feeding supplement (PROSource TF)  45 mL Per Tube BID   folic acid  1 mg Intravenous Daily   free water  100 mL Per Tube Q6H   linezolid  600 mg Per Tube Q12H   mouth rinse  15 mL Mouth Rinse 10 times per day   metoprolol tartrate  12.5 mg Per Tube BID   multivitamin with minerals  1 tablet Per Tube Daily   mupirocin ointment  1 application. Nasal BID   niacin  200 mg Per Tube BID   nicotine  14 mg Transdermal Daily   pantoprazole sodium  40 mg Per Tube BID   polyethylene glycol  17 g Per Tube Daily   sodium chloride HYPERTONIC  4 mL Nebulization BID   sucralfate  1 g Per Tube TID WC & HS   Continuous Infusions:  sodium chloride 10 mL/hr at 08/04/21 0300   sodium chloride     amiodarone 30 mg/hr (08/04/21 0404)   dexmedetomidine (PRECEDEX) IV infusion 0.9 mcg/kg/hr (08/04/21 0300)   feeding supplement (VITAL 1.5 CAL) 1,000 mL (08/02/21 1204)   fentaNYL infusion INTRAVENOUS 150 mcg/hr (08/04/21 0300)   phenylephrine (NEO-SYNEPHRINE) Adult infusion     propofol (DIPRIVAN) infusion 30 mcg/kg/min (08/04/21 0410)   PRN Meds: acetaminophen (TYLENOL) oral liquid 160 mg/5 mL **OR** acetaminophen, fentaNYL, LORazepam, ondansetron **OR** ondansetron (ZOFRAN) IV   Vital Signs    Vitals:   08/04/21 0028 08/04/21 0100 08/04/21 0200 08/04/21 0311  BP: (!) 134/94 (!) 144/97 123/86 (!) 127/96  Pulse: 87 83 (!) 102 100  Resp: _0 Temp:      TempSrc:      SpO2: 100% 100% 100% 100%  Weight:      Height:         Intake/Output Summary (Last 24 hours) at 08/04/2021 0653 Last data filed at 08/04/2021 0300 Gross per 24 hour  Intake 3148.48 ml  Output 1075 ml  Net 2073.48 ml      08/03/2021    5:00 AM 08/02/2021    5:00 AM 08/01/2021    5:00 AM  Last 3 Weights  Weight (lbs) 135 lb 5.8 oz 127 lb 6.8 oz 130 lb 8.2 oz  Weight (kg) 61.4 kg 57.8 kg 59.2 kg      Telemetry    Aflutter with HR 70-100s - Personally Reviewed  ECG    5/24: Aflutter - Personally Reviewed  Physical Exam   GEN: Intubated, chronically ill appearing Neck: No JVD Cardiac: Regular, no murmurs  Respiratory: Coarse vent sounds GI: Soft, nontender, non-distended  MS: No edema; No deformity. Neuro:  Sedated Psych: Unable to assess  Labs    High Sensitivity Troponin:   Recent Labs  Lab 07/30/2021 0928 07/15/2021 1131  TROPONINIHS 45* 38*     Chemistry Recent Labs  Lab 07/31/21 0140 08/01/21 0202 08/01/21 1814 08/02/21 0340 08/03/21 0738 08/04/21 0122  NA 144 141   < > 140 138 131*  K 3.8 4.7   < > 3.9 4.9 4.5  CL 111 109  --  109 106 101  CO2 27 28  --  24 22 20*  GLUCOSE 113* 134*  --  129* 112* 110*  BUN 20 21  --  24* 21 18  CREATININE 1.20 1.13  --  0.96 0.95 0.85  CALCIUM 8.1* 8.1*  --  7.9* 7.8* 7.6*  MG 1.9 2.3  --  2.1  --   --   GFRNONAA >60 >60  --  >60 >60 >60  ANIONGAP 6 4*  --  7 10 10   < > = values in this interval not displayed.    Lipids  Recent Labs  Lab 08/02/21 0340  TRIG 51    Hematology Recent Labs  Lab 08/01/21 0202 08/01/21 1814 08/02/21 0336 08/02/21 0428 08/04/21 0122  WBC 13.1*  --   --  8.3 12.0*  RBC 3.26*  --   --  3.00* 3.44*  HGB 10.3*   < > 9.9* 10.2* 11.5*  HCT 31.3*   < > 29.0* 31.3* 32.7*  MCV 96.0  --   --  104.3* 95.1  MCH 31.6  --   --  34.0 33.4  MCHC 32.9  --   --  32.6 35.2  RDW 14.7  --   --  16.1* 14.3  PLT 216  --   --  165 142*   < > = values in this interval not displayed.   Thyroid No results for input(s): TSH, FREET4 in the last 168  hours.  BNP Recent Labs  Lab 07/28/21 1551  BNP 1,220.8*    DDimer No results for input(s): DDIMER in the last 168 hours.   Radiology    DG Chest Port 1 View  Result Date: 08/03/2021 CLINICAL DATA:  NG tube placement EXAM: PORTABLE CHEST 1 VIEW COMPARISON:  Previous studies including the examination of 08/02/2021 FINDINGS: Transverse diameter of heart is increased. There are no signs of alveolar pulmonary edema. There is crowding of markings in the lower lung fields which may be due to poor inspiration. There is increased density in the medial aspects of both lower lung fields. There is no significant pleural effusion or pneumothorax. Tip of endotracheal tube is proximally 6.7 cm above the carina. There are 2 enteric tubes traversing the esophagus with the distal portions in the stomach. IMPRESSION: Cardiomegaly. There are no signs of pulmonary edema. Increased density in the medial aspect of lower lung fields may suggest atelectasis. Other findings as described in the body of the report. Electronically Signed   By: Palani  Rathinasamy M.D.   On: 08/03/2021 17:21   DG Abd Portable 1V  Result Date: 08/03/2021 CLINICAL DATA:  Enteric tube placement EXAM: PORTABLE ABDOMEN - 1 VIEW COMPARISON:  07/27/2021 FINDINGS: There is a feeding tube and nasogastric tube with the tips in the region of second portion of duodenum. There is mild dilation of small-bowel loops. Lower abdomen and pelvis are not included in the radiograph. IMPRESSION: Tips of feeding tube and NG tube are noted in the area of proximal duodenum. Electronically Signed   By: Palani  Rathinasamy M.D.   On: 08/03/2021 17:23    Cardiac Studies   TTE 07/2020: IMPRESSIONS     1. Left ventricular ejection fraction, by estimation, is <20%. The left  ventricle has severely decreased function. The left ventricle demonstrates  global hypokinesis. There is moderate left ventricular hypertrophy of the  posterior segment.   2. Right    ventricular systolic function is normal. The right ventricular  size is mildly enlarged.   3. Left atrial size was moderately dilated.   4. The pericardial effusion is posterior to the left ventricle.   5. The mitral valve is normal in structure. Mild to moderate mitral valve  regurgitation. No evidence of mitral stenosis.   6. The aortic valve is normal in structure. Aortic valve regurgitation is  not visualized. No aortic stenosis is present.   7. The inferior vena cava is dilated in size with <50% respiratory  variability, suggesting right atrial pressure of 15 mmHg.   Comparison(s): Prior EF 25-30%.   Patient Profile     61 y.o. male with history of severe cardiomyopathy thought to be secondary to tachycardia-induced CM and alcohol abuse, CKD III, bipolar disorder, cirrhosis, and polysubstance abuse who presented with chest pain, lightheadedness and nausea and vomiting with course complicated by hypotension, acute hypoxic respiratory failure, ETOH withdrawal and Aflutter for which Cardiology was consulted.   Assessment & Plan    #Acute on Chronic Systolic HF: LVEF<20%. Thought to be due to tachycardia induced CM and alcohol abuse. Has been noncompliant with medications and follow-up with recurrent admission for acute exacerbations and aflutter with RVR. Continues to have ongoing substance abuse. Will continue with GDMT as tolerated as patient is not a candidate for advanced HF due to noncompliance.   -Continue metop 12.5mg BID -Will add GDMT as able -Agree with ongoing GOC discussions  #Aflutter: Remains with Aflutter with HR 110 which is okay given his underlying CM with LVEF<20%. Will continue amiodarone  and metoprolol. BP limiting up-titration of BB. -Continue amiodarone 200mg BID -Continue apixaban 5mg BID -Continue metop 12.5mg BID  #Acute Hypoxic Respiratory Failure: -Vent management per ICU team -On IV linezolid  #ETOH withdrawal: -On phenobarbital taper -CIWA  #CKD  IIIB: -Trend  #Polysubstance Abuse:  Cardiology will sign-off. Please call with questions or concerns.     For questions or updates, please contact [facility] Please consult [email or web address] for contact info under        Signed,  E , MD  08/04/2021, 6:53 AM    

## 2021-08-03 NOTE — Progress Notes (Signed)
CCM called to bedside to assess pt's airway and exchange airway if needed. Cuff leak was persistent even with repositioning and adding air into the cuff. CCM exchanged the airway with a bougie and placed a 8.0 ETT at 25 lip. Quickly after insertion, pt's saturations began to decrease, very low volumes were found on ventilator, and belly became hard. RT removed airway from pt and started to bag pt up to appropriate saturations. CCM then intubated with the gladescope without complication. Pt has 8.0 ETT at 25 at the lip. Pt it stable at this time. RT will monitor.

## 2021-08-03 NOTE — Progress Notes (Signed)
Progress Note  Patient Name: Austin Cobb Date of Encounter: 08/03/2021  [facility] Cardiologist: Werner Lean, MD   Subjective   Sedate in bed ventilated ill-appearing  Inpatient Medications    Scheduled Meds:  apixaban  5 mg Per Tube BID   vitamin C  500 mg Per Tube BID   chlorhexidine gluconate (MEDLINE KIT)  15 mL Mouth Rinse BID   docusate  100 mg Per Tube BID   feeding supplement (PROSource TF)  45 mL Per Tube BID   folic acid  1 mg Intravenous Daily   free water  100 mL Per Tube Q6H   linezolid  600 mg Per Tube Q12H   mouth rinse  15 mL Mouth Rinse 10 times per day   metoprolol tartrate  12.5 mg Per Tube BID   multivitamin with minerals  1 tablet Per Tube Daily   mupirocin ointment  1 application. Nasal BID   niacin  200 mg Per Tube BID   nicotine  14 mg Transdermal Daily   pantoprazole sodium  40 mg Per Tube BID   PHENObarbital  32.5 mg Intravenous Q8H   polyethylene glycol  17 g Per Tube Daily   sodium chloride HYPERTONIC  4 mL Nebulization BID   sucralfate  1 g Per Tube TID WC & HS   Continuous Infusions:  sodium chloride 10 mL/hr at 08/03/21 0600   sodium chloride     amiodarone 30 mg/hr (08/03/21 0600)   dexmedetomidine (PRECEDEX) IV infusion 0.9 mcg/kg/hr (08/03/21 0727)   feeding supplement (VITAL 1.5 CAL) 1,000 mL (08/02/21 1204)   fentaNYL infusion INTRAVENOUS 130 mcg/hr (08/03/21 0600)   phenylephrine (NEO-SYNEPHRINE) Adult infusion     propofol (DIPRIVAN) infusion 20 mcg/kg/min (08/03/21 0600)   thiamine injection Stopped (08/02/21 1124)   PRN Meds: acetaminophen (TYLENOL) oral liquid 160 mg/5 mL **OR** acetaminophen, fentaNYL, LORazepam, ondansetron **OR** ondansetron (ZOFRAN) IV   Vital Signs    Vitals:   08/03/21 0400 08/03/21 0500 08/03/21 0600 08/03/21 0755  BP: 121/87 131/89 121/84 112/79  Pulse: 100 (!) 106 88   Resp: _0 Temp: 100.2 F (37.9 C) 100 F (37.8 C) 99.9 F (37.7 C)   TempSrc: Bladder      SpO2: 99% 99% 99%   Weight:  61.4 kg    Height:        Intake/Output Summary (Last 24 hours) at 08/03/2021 0836 Last data filed at 08/03/2021 0600 Gross per 24 hour  Intake 2658.91 ml  Output 600 ml  Net 2058.91 ml      08/03/2021    5:00 AM 08/02/2021    5:00 AM 08/01/2021    5:00 AM  Last 3 Weights  Weight (lbs) 135 lb 5.8 oz 127 lb 6.8 oz 130 lb 8.2 oz  Weight (kg) 61.4 kg 57.8 kg 59.2 kg      Telemetry    2-1 atrial flutter heart rate in the 110's- Personally Reviewed  ECG    No new- Personally Reviewed  Physical Exam   Obtunded ill-appearing ventilated  Labs    High Sensitivity Troponin:   Recent Labs  Lab 07/12/2021 0928 07/24/2021 1131  TROPONINIHS 45* 38*     Chemistry Recent Labs  Lab 07/28/21 0620 07/29/21 0145 07/31/21 0140 08/01/21 0202 08/01/21 1814 08/02/21 0336 08/02/21 0340  NA 145   < > 144 141 141 141 140  K 3.8   < > 3.8 4.7 4.6 3.9 3.9  CL 115*   < >  111 109  --   --  109  CO2 21*   < > 27 28  --   --  24  GLUCOSE 116*   < > 113* 134*  --   --  129*  BUN 20   < > 20 21  --   --  24*  CREATININE 1.07   < > 1.20 1.13  --   --  0.96  CALCIUM 8.3*   < > 8.1* 8.1*  --   --  7.9*  MG 1.7   < > 1.9 2.3  --   --  2.1  PROT 6.5  --   --   --   --   --   --   ALBUMIN 2.7*  --   --   --   --   --   --   AST 17  --   --   --   --   --   --   ALT 11  --   --   --   --   --   --   ALKPHOS 46  --   --   --   --   --   --   BILITOT 0.4  --   --   --   --   --   --   GFRNONAA >60   < > >60 >60  --   --  >60  ANIONGAP 9   < > 6 4*  --   --  7   < > = values in this interval not displayed.    Lipids  Recent Labs  Lab 08/02/21 0340  TRIG 51    Hematology Recent Labs  Lab 07/31/21 0140 08/01/21 0202 08/01/21 1814 08/02/21 0336 08/02/21 0428  WBC 15.3* 13.1*  --   --  8.3  RBC 3.38* 3.26*  --   --  3.00*  HGB 10.7* 10.3* 10.5* 9.9* 10.2*  HCT 32.0* 31.3* 31.0* 29.0* 31.3*  MCV 94.7 96.0  --   --  104.3*  MCH 31.7 31.6  --   --  34.0   MCHC 33.4 32.9  --   --  32.6  RDW 14.8 14.7  --   --  16.1*  PLT 232 216  --   --  165   Thyroid No results for input(s): TSH, FREET4 in the last 168 hours.  BNP Recent Labs  Lab 07/28/21 1551  BNP 1,220.8*    DDimer No results for input(s): DDIMER in the last 168 hours.   Radiology    DG CHEST PORT 1 VIEW  Result Date: 08/02/2021 CLINICAL DATA:  Intubation EXAM: PORTABLE CHEST 1 VIEW COMPARISON:  Chest x-ray dated Aug 01, 2021 FINDINGS: Interval intubation with ETT positioned approximately 5.0 cm from the carina. Feeding tube partially seen coursing below the diaphragm. No focal consolidation. No large pleural effusion or pneumothorax. IMPRESSION: Interval intubation with ETT positioned approximately 5.0 cm from the carina. Electronically Signed   By: Yetta Glassman M.D.   On: 08/02/2021 08:17    Cardiac Studies   EF less than 20%  Patient Profile     61 y.o. male with severe cardiomyopathy possibly combination of tachycardia mediated as well as alcohol toxicity with atrial flutter, respiratory failure alcohol withdrawal polysubstance abuse. Assessment & Plan    -Continue with supportive care.  Currently not requiring pressor agent.  Has been on IV amiodarone as well as low-dose p.o. metoprolol.  Blood pressure has been  limiting.  Overall heart rate in the 110 range which is reasonable to help maintain stable cardiac output in his hyperadrenergic setting.  With his severely reduced ejection fraction, he is at high risk for further morbidity and mortality.  So far maintaining adequate renal perfusion based upon creatinine.  Ventilator management per critical care.  Mildly elevated troponin, flat is secondary to myocardial injury in the setting of his metabolic illness.  CRITICAL CARE Performed by: Candee Furbish   Total critical care time: 31 minutes  Critical care time was exclusive of separately billable procedures and treating other patients.  Critical care was necessary to  treat or prevent imminent or life-threatening deterioration.  Critical care was time spent personally by me on the following activities: development of treatment plan with patient and/or surrogate as well as nursing, discussions with consultants, evaluation of patient's response to treatment, examination of patient, obtaining history from patient or surrogate, ordering and performing treatments and interventions, ordering and review of laboratory studies, ordering and review of radiographic studies, pulse oximetry and re-evaluation of patient's condition.    For questions or updates, please contact [place] Please consult [email or web address] for contact info under        Signed, Candee Furbish, MD  08/03/2021, 8:36 AM

## 2021-08-03 NOTE — Procedures (Signed)
Intubation Procedure Note  Austin Cobb  [id number]  07/19/1960  Date:08/03/21  Time:3:44 PM   Provider Performing:Elbridge Magowan C Tamala Julian    Procedure: Intubation (91916)  Indication(s) Respiratory Failure, cuff leak  Consent Unable to obtain consent due to emergent nature of procedure.   Anesthesia Etomidate and Rocuronium Prop/precedex/fent ongoing  Time Out Verified patient identification, verified procedure, site/side was marked, verified correct patient position, special equipment/implants available, medications/allergies/relevant history reviewed, required imaging and test results available.   Sterile Technique Usual hand hygeine, masks, and gloves were used   Procedure Description Patient positioned in bed supine.  Sedation given.  Changed tube over bougee with resistance and new tube ended up in esophagus.  ETT taken out and patient bagged back up.  Sats low for maybe 1 minute.  Glidescope then used to reintubate.  Secured at 25cm at teeth.  Complications/Tolerance See above Chest X-ray is ordered to verify placement.   EBL Minimal   Specimen(s) None

## 2021-08-03 NOTE — Progress Notes (Signed)
NAME:  Austin Cobb, MRN:  [id number], DOB:  12/27/1960, LOS: 12 ADMISSION DATE:  07/19/2021, CONSULTATION DATE:  07/28/21 REFERRING MD:  Pasty Arch, CHIEF COMPLAINT:  Cardiogenic shock   History of Present Illness:  Austin Cobb is a 61 y/o gentleman with a history of ETOH & tobacco abuse, HFrEF, CKD 3b, bipolar disorder who presented on 5/13 with CP, lightheadedness, n/v. He also has odynophagia with pain in his chest after swallowing. He had an episode of hematemesis the night before presentation.  He was found to have Aflutter with RVR and has been managed with beta blockers. Not felt to be a good candidate previously for DCCV due to esophageal pain and questionable compliance after discharge with DOAC. He underwent multiple cardioversions in 2019. He has been taken off Entresto due to AKI. He developed hypotension on the floor & he was moved to the ICU due to concern for need for vasopressors for decompensated heart failure.    He drinks 2 x 40 oz beers per day usually, more the few days PTA. Last drink 24 hrs before admission.  He was initially treated with a  librium taper + CIWA but has not needed benzodiazepines recently.  Pertinent  Medical History  ETOH abuse HFrEF Bipolar disorder GERD Tobacco abuse GERD  Significant Hospital Events: Including procedures, antibiotic start and stop dates in addition to other pertinent events   5/13 admitted 5/20 moved to ICU 5/21 Babbling nonsensically, moist speech/gurgling, mitts in place, refusing NTS 5/24 Continues to remain encephalopathic with thick upper airway secretions auscultated   5/25 Remains intubated with abundant MRSA on sputum culture  5/26 No major issues overnight  Interim History / Subjective:  Sedated on vent   Objective   Blood pressure 121/84, pulse 88, temperature 99.9 F (37.7 C), resp. rate 16, height '5\' 9"'$  (1.753 m), weight 61.4 kg, SpO2 99 %.    Vent Mode: PRVC FiO2 (%):  [30 %] 30 % Set Rate:  [16 bmp]  16 bmp Vt Set:  [580 mL] 580 mL PEEP:  [5 cmH20] 5 cmH20 Plateau Pressure:  [19 cmH20-20 cmH20] 20 cmH20   Intake/Output Summary (Last 24 hours) at 08/03/2021 0762 Last data filed at 08/03/2021 0600 Gross per 24 hour  Intake 2809.46 ml  Output 600 ml  Net 2209.46 ml    Filed Weights   08/01/21 0500 08/02/21 0500 08/03/21 0500  Weight: 59.2 kg 57.8 kg 61.4 kg    Examination: General: Acute on chronically ill appearing middle elderly male lying in bed on mechanical ventilation, in NAD HEENT: ETT, MM pink/moist, PERRL,  Neuro: Sedated on vemt CV: s1s2 regular rate and rhythm, no murmur, rubs, or gallops,  PULM:  Clear to ascultation, no increased work of breathing, no added breath sounds  GI: soft, bowel sounds active in all 4 quadrants, non-tender, non-distended, tolerating TF Extremities: warm/dry, no edema  Skin: no rashes or lesions  Resolved Hospital Problem list   AKI on CKD 3b Hypernatremia, Hypokalemia, Hypomagnesemia   Assessment & Plan:  Acute Hypoxic Respiratory Failure -MRSA seen on respiratory culture, ? Colonization but secretions were significant during intubation   P: Continue ventilator support with lung protective strategies  Wean PEEP and FiO2 for sats greater than 90%. Head of bed elevated 30 degrees. Plateau pressures less than 30 cm H20.  Follow intermittent chest x-ray and ABG.   SAT/SBT as tolerated, mentation preclude extubation  Ensure adequate pulmonary hygiene  Follow cultures  VAP bundle in place  PAD protocol Continue  IV Linezolid  Acute on chronic HFrEF due to tachyarrhythmia A-flutter with RVR Elevated Troponin 2/2 Myocardial Injury due to AFlutter -Cardiology evaluated and continues to recommend medical management due to fragility P: Continuous telemetry  GDMT as tolerated  Strict intake and output  Closely monitor renal function and electrolytes  Vent support above Continue IV Amio and PO Lopressor  Remains on Eliquis and  Farxiga  ETOH withdrawal with Dts Acute metabolic encephalopathy. P: Remains on Phenobarbital taper  CIWA protocol  PRN Ativan  Seizure precautions Supplement thiamine, folate and MVI    Moderate protein energy malnutrition P: Continue TF via Cortrack  CKD 3b P: Follow renal function  Monitor urine output Trend Bmet Avoid nephrotoxins Ensure adequate renal perfusion   Tobacco abuse Polysubstance / ETOH Abuse  P: Continue nicotine patch   GERD, possibly esophagitis P: PPI and Carafate   Best Practice (right click and "Reselect all SmartList Selections" daily)  Diet/type: tubefeeds DVT prophylaxis: DOAC GI prophylaxis: PPI Lines: N/A Foley:  N/A Code Status:  full code Last date of multidisciplinary goals of care discussion: Palliative assisting in [place] discussion with family, continue current care over the weekend with hopes of improvement. Plan to reengage with family Monday per Palliative   Critical care time: 37 min  \Austin Cobb D. Kenton Kingfisher, NP-C [facility] Personal contact information can be found on Amion  08/03/2021, 7:17 AM

## 2021-08-03 NOTE — Progress Notes (Signed)
Holding off on CPT for right now due to pt being agitated and soft BP.

## 2021-08-03 NOTE — Progress Notes (Signed)
Patient w/ 3 usn pivs @ this time.Secure chat w/ unit RN advise a central access for multiple IV drips including vasopressors.

## 2021-08-03 NOTE — Progress Notes (Signed)
Pharmacy Antibiotic Note  Austin Cobb is a 61 y.o. male admitted on 08/02/2021 with CP/ light-headedness and possible alcohol withdrawal. Now with concern for tracheobronchitis. Of note, patient has very poor dentition and has been unable to clear secretions requiring intubation 5/25. Pharmacy has been consulted for linezolid dosing.   5/26: Scr<1, Tm 101.5 (has remained slightly elevated), WBC WNL (came down after abx were started, CXR has remained clear, TA growing abundant MRSA   Plan: CONTINUE Linezolid 600 mg PO BID (total 7d MRSA coverage- stop date entered for 5/29)  Monitor fever curve, clinical status, F/U blood cultures   Height: '5\' 9"'$  (175.3 cm) Weight: 61.4 kg (135 lb 5.8 oz) IBW/kg (Calculated) : 70.7  Temp (24hrs), Avg:99.1 F (37.3 C), Min:96.6 F (35.9 C), Max:101.5 F (38.6 C)  Recent Labs  Lab 07/28/21 1551 07/28/21 1838 07/29/21 0145 07/30/21 0114 07/31/21 0140 08/01/21 0202 08/02/21 0340 08/02/21 0428 08/03/21 0738  WBC  --   --  9.0 13.3* 15.3* 13.1*  --  8.3  --   CREATININE  --   --  1.25* 1.14 1.20 1.13 0.96  --  0.95  LATICACIDVEN 1.2 1.0  --   --   --   --   --   --   --      Estimated Creatinine Clearance: 70 mL/min (by C-G formula based on SCr of 0.95 mg/dL).    Allergies  Allergen Reactions   Adhesive [Tape] Other (See Comments)    Heat and EKG pads flare pre-existing eczema    Antimicrobials this admission: Vanc 5/23>>5/25 Cefepime 5/23>>5/25 Linezolid 5/26>> (5/29)  Dose adjustments this admission: N/A  Microbiology results: 5/14 Ucx: insignificant growth  5/22 MRSA PCR: detected 5/22 TA: abundant MRSA, few GNRs 5/24 TA: Few MRSA, rare GPCs in chains, no PsA isolated  5/23 Bcx: NGTD    Adria Dill, PharmD PGY-1 Acute Care Resident  08/03/2021 9:38 AM

## 2021-08-04 DIAGNOSIS — I5022 Chronic systolic (congestive) heart failure: Secondary | ICD-10-CM

## 2021-08-04 DIAGNOSIS — F10931 Alcohol use, unspecified with withdrawal delirium: Secondary | ICD-10-CM | POA: Diagnosis not present

## 2021-08-04 DIAGNOSIS — I4892 Unspecified atrial flutter: Secondary | ICD-10-CM | POA: Diagnosis not present

## 2021-08-04 LAB — GLUCOSE, CAPILLARY
Glucose-Capillary: 106 mg/dL — ABNORMAL HIGH (ref 70–99)
Glucose-Capillary: 119 mg/dL — ABNORMAL HIGH (ref 70–99)
Glucose-Capillary: 124 mg/dL — ABNORMAL HIGH (ref 70–99)
Glucose-Capillary: 72 mg/dL (ref 70–99)
Glucose-Capillary: 78 mg/dL (ref 70–99)
Glucose-Capillary: 98 mg/dL (ref 70–99)

## 2021-08-04 LAB — BASIC METABOLIC PANEL
Anion gap: 10 (ref 5–15)
BUN: 18 mg/dL (ref 8–23)
CO2: 20 mmol/L — ABNORMAL LOW (ref 22–32)
Calcium: 7.6 mg/dL — ABNORMAL LOW (ref 8.9–10.3)
Chloride: 101 mmol/L (ref 98–111)
Creatinine, Ser: 0.85 mg/dL (ref 0.61–1.24)
GFR, Estimated: >60 mL/min (ref >60)
Glucose, Bld: 110 mg/dL — ABNORMAL HIGH (ref 70–99)
Potassium: 4.5 mmol/L (ref 3.5–5.1)
Sodium: 131 mmol/L — ABNORMAL LOW (ref 135–145)

## 2021-08-04 LAB — CBC
HCT: 32.7 % — ABNORMAL LOW (ref 39.0–52.0)
Hemoglobin: 11.5 g/dL — ABNORMAL LOW (ref 13.0–17.0)
MCH: 33.4 pg (ref 26.0–34.0)
MCHC: 35.2 g/dL (ref 30.0–36.0)
MCV: 95.1 fL (ref 80.0–100.0)
Platelets: 142 10*3/uL — ABNORMAL LOW (ref 150–400)
RBC: 3.44 MIL/uL — ABNORMAL LOW (ref 4.22–5.81)
RDW: 14.3 % (ref 11.5–15.5)
WBC: 12 10*3/uL — ABNORMAL HIGH (ref 4.0–10.5)
nRBC: 0 % (ref 0.0–0.2)

## 2021-08-04 MED ORDER — DEXTROSE IN LACTATED RINGERS 5 % IV SOLN: INTRAVENOUS | Status: DC

## 2021-08-04 MED ORDER — METOPROLOL TARTRATE 25 MG/10 ML ORAL SUSPENSION
25.0000 mg | Freq: Two times a day (BID) | ORAL | Status: DC
  Administered 2021-08-04 – 2021-08-06 (×4): 25 mg
  Filled 2021-08-04 (×3): qty 10

## 2021-08-04 MED ORDER — AMIODARONE HCL 200 MG PO TABS: 200.0000 mg | ORAL_TABLET | Freq: Every day | ORAL | Status: DC

## 2021-08-04 MED ORDER — METOPROLOL TARTRATE 25 MG/10 ML ORAL SUSPENSION
12.5000 mg | Freq: Once | ORAL | Status: AC
  Administered 2021-08-04: 12.5 mg
  Filled 2021-08-04: qty 10

## 2021-08-04 MED ORDER — AMIODARONE HCL 200 MG PO TABS
200.0000 mg | ORAL_TABLET | Freq: Two times a day (BID) | ORAL | Status: DC
  Administered 2021-08-04 – 2021-08-07 (×7): 200 mg
  Filled 2021-08-04 (×7): qty 1

## 2021-08-04 NOTE — Progress Notes (Signed)
Bite block inserted no skin break down noted on lips prior to insertion.

## 2021-08-04 NOTE — Progress Notes (Signed)
NAME:  Austin Cobb, MRN:  [id number], DOB:  Sep 19, 1960, LOS: 23 ADMISSION DATE:  08/03/2021, CONSULTATION DATE:  07/28/21 REFERRING MD:  Pasty Arch, CHIEF COMPLAINT:  Cardiogenic shock   History of Present Illness:  Austin Cobb is a 61 y/o gentleman with a history of ETOH & tobacco abuse, HFrEF, CKD 3b, bipolar disorder who presented on 5/13 with CP, lightheadedness, n/v. He also has odynophagia with pain in his chest after swallowing. He had an episode of hematemesis the night before presentation.  He was found to have Aflutter with RVR and has been managed with beta blockers. Not felt to be a good candidate previously for DCCV due to esophageal pain and questionable compliance after discharge with DOAC. He underwent multiple cardioversions in 2019. He has been taken off Entresto due to AKI. He developed hypotension on the floor & he was moved to the ICU due to concern for need for vasopressors for decompensated heart failure.    He drinks 2 x 40 oz beers per day usually, more the few days PTA. Last drink 24 hrs before admission.  He was initially treated with a  librium taper + CIWA but has not needed benzodiazepines recently.  Pertinent  Medical History  ETOH abuse HFrEF Bipolar disorder GERD Tobacco abuse GERD  Significant Hospital Events: Including procedures, antibiotic start and stop dates in addition to other pertinent events   5/13 admitted 5/20 moved to ICU 5/21 Babbling nonsensically, moist speech/gurgling, mitts in place, refusing NTS 5/24 Continues to remain encephalopathic with thick upper airway secretions auscultated   5/25 Remains intubated with abundant MRSA on sputum culture  5/26 ETT exchange for cuff leak   Interim History / Subjective:  No events. Remains intubated/sedated.  Objective   Blood pressure 100/78, pulse 76, temperature 97.8 F (36.6 C), temperature source Axillary, resp. rate 16, height '5\' 9"'$  (1.753 m), weight 61.4 kg, SpO2 100 %.    Vent  Mode: PRVC FiO2 (%):  [30 %-50 %] 40 % Set Rate:  [16 bmp] 16 bmp Vt Set:  [580 mL] 580 mL PEEP:  [5 cmH20] 5 cmH20 Plateau Pressure:  [13 cmH20-23 cmH20] 13 cmH20   Intake/Output Summary (Last 24 hours) at 08/04/2021 9417 Last data filed at 08/04/2021 0300 Gross per 24 hour  Intake 2204.48 ml  Output 1025 ml  Net 1179.48 ml    Filed Weights   08/01/21 0500 08/02/21 0500 08/03/21 0500  Weight: 59.2 kg 57.8 kg 61.4 kg    Examination: Biting on tube Having cuff leak again Ext warm Lungs with rhonci stable No edema Abdomen firm but +BS  BMP ok WBC up slightly  Resolved Hospital Problem list   AKI on CKD 3b Hypernatremia, Hypokalemia, Hypomagnesemia   Assessment & Plan:  Acute Hypoxic Respiratory Failure secondary to MRSA tracheobronchitis and poor secretion clearance P: IV linezolid to complete 8 day course Vent/VAP prevention bundle Due to trauma from ETT exchange yesterday would not try to extubate for at least 48h; in addition, would like clarification on GOC, do not think he would benefit from repeat intubation and tracheostomy  Acute on chronic HFrEF due to tachyarrhythmia A-flutter with RVR Elevated Troponin 2/2 Myocardial Injury due to AFlutter -Cardiology evaluated and continues to recommend medical management due to fragility P: Amio to PO Continue BB Continue eliquis  ETOH withdrawal with Dts Acute metabolic encephalopathy. Question developing EtOH dementia P: Completed phenobarbital taper  Seizure precautions Supplement thiamine, folate and MVI    Severe protein energy malnutrition P: Continue  TF via Cortrack  CKD 3b P: Follow renal function  Monitor urine output Trend Bmet Avoid nephrotoxins Ensure adequate renal perfusion   Tobacco abuse Polysubstance / ETOH Abuse  P: Continue nicotine patch   GERD, possibly esophagitis P: PPI and Carafate   GOC- I think give time for glottic swelling to ease up today and tomorrow.  Monday may do  extubation trial to see if he can answer [place] questions for himself.  If he continues to lack capacity we will need to continue to ask his estranged family to consider allowing him to go peacefully.  Best Practice (right click and "Reselect all SmartList Selections" daily)  Diet/type: tubefeeds DVT prophylaxis: DOAC GI prophylaxis: PPI Lines: N/A Foley:  N/A Code Status:  full code Last date of multidisciplinary goals of care discussion: Palliative assisting in [place] discussion with family, continue current care over the weekend with hopes of improvement. Plan to reengage with family Monday per Palliative   Critical care time: 58 min  Erskine Emery MD [facility] Personal contact information can be found on Amion  08/04/2021, 8:07 AM

## 2021-08-05 ENCOUNTER — Inpatient Hospital Stay (HOSPITAL_COMMUNITY): Payer: Medicare (Managed Care)

## 2021-08-05 DIAGNOSIS — R569 Unspecified convulsions: Secondary | ICD-10-CM

## 2021-08-05 DIAGNOSIS — F10939 Alcohol use, unspecified with withdrawal, unspecified: Secondary | ICD-10-CM | POA: Diagnosis not present

## 2021-08-05 DIAGNOSIS — F10931 Alcohol use, unspecified with withdrawal delirium: Secondary | ICD-10-CM | POA: Diagnosis not present

## 2021-08-05 LAB — CULTURE, BLOOD (ROUTINE X 2)
Culture: NO GROWTH
Culture: NO GROWTH

## 2021-08-05 LAB — BASIC METABOLIC PANEL
Anion gap: 9 (ref 5–15)
BUN: 13 mg/dL (ref 8–23)
CO2: 27 mmol/L (ref 22–32)
Calcium: 8 mg/dL — ABNORMAL LOW (ref 8.9–10.3)
Chloride: 99 mmol/L (ref 98–111)
Creatinine, Ser: 0.8 mg/dL (ref 0.61–1.24)
GFR, Estimated: >60 mL/min (ref >60)
Glucose, Bld: 132 mg/dL — ABNORMAL HIGH (ref 70–99)
Potassium: 5.1 mmol/L (ref 3.5–5.1)
Sodium: 135 mmol/L (ref 135–145)

## 2021-08-05 LAB — CBC
HCT: 34 % — ABNORMAL LOW (ref 39.0–52.0)
Hemoglobin: 11.3 g/dL — ABNORMAL LOW (ref 13.0–17.0)
MCH: 32.4 pg (ref 26.0–34.0)
MCHC: 33.2 g/dL (ref 30.0–36.0)
MCV: 97.4 fL (ref 80.0–100.0)
Platelets: 199 10*3/uL (ref 150–400)
RBC: 3.49 MIL/uL — ABNORMAL LOW (ref 4.22–5.81)
RDW: 14.5 % (ref 11.5–15.5)
WBC: 11 10*3/uL — ABNORMAL HIGH (ref 4.0–10.5)
nRBC: 0 % (ref 0.0–0.2)

## 2021-08-05 LAB — GLUCOSE, CAPILLARY
Glucose-Capillary: 110 mg/dL — ABNORMAL HIGH (ref 70–99)
Glucose-Capillary: 118 mg/dL — ABNORMAL HIGH (ref 70–99)
Glucose-Capillary: 119 mg/dL — ABNORMAL HIGH (ref 70–99)
Glucose-Capillary: 152 mg/dL — ABNORMAL HIGH (ref 70–99)
Glucose-Capillary: 156 mg/dL — ABNORMAL HIGH (ref 70–99)
Glucose-Capillary: 176 mg/dL — ABNORMAL HIGH (ref 70–99)

## 2021-08-05 LAB — TRIGLYCERIDES: Triglycerides: 63 mg/dL (ref <150)

## 2021-08-05 MED ORDER — DEXMEDETOMIDINE HCL IN NACL 400 MCG/100ML IV SOLN
0.4000 ug/kg/h | INTRAVENOUS | Status: DC
  Administered 2021-08-05 (×2): 1.2 ug/kg/h via INTRAVENOUS
  Administered 2021-08-05: 0.4 ug/kg/h via INTRAVENOUS
  Administered 2021-08-05 – 2021-08-06 (×6): 1.2 ug/kg/h via INTRAVENOUS
  Administered 2021-08-07 (×2): 1 ug/kg/h via INTRAVENOUS
  Filled 2021-08-05 (×12): qty 100

## 2021-08-05 NOTE — Progress Notes (Signed)
EEG complete - results pending 

## 2021-08-05 NOTE — Progress Notes (Signed)
RT NOTE: RT attempted SBT on patient this AM on CPAP/PSV of 15/5 at 0815, however patient noted to take several breaths with VT in the 100s then would take one large breath with VT in 800s.  Placed patient back on full support settings and is tolerating well at this time.  Sputum sample also obtained, per MD order, and sent down to main lab without complications.  RT will continue to monitor.

## 2021-08-05 NOTE — Procedures (Signed)
Routine EEG Report  Austin Cobb is a 61 y.o. male with a history of alcohol withdrawal who is undergoing an EEG to evaluate for seizures.  Report: This EEG was acquired with electrodes placed according to the International 10-20 electrode system (including Fp1, Fp2, F3, F4, C3, C4, P3, P4, O1, O2, T3, T4, T5, T6, A1, A2, Fz, Cz, Pz). The following electrodes were missing or displaced: none.  The background was diffusely slow at 5-6 Hz with frequent intervening periods of diffuse suppression lasting up to 1-2 seconds. This activity is reactive to stimulation. No sleep architecture was identified. There was no focal slowing. There were no interictal epileptiform discharges. There were no electrographic seizures identified. Photic stimulation and hyperventilation were not performed.   Impression and clinical correlation: This EEG was obtained while sedated on fentanyl and propofol and is abnormal due to moderate diffuse slowing and brief periods of intervening diffuse suppression, both indicative of global cerebral dysfunction, medication effect, or both. Epileptiform abnormalities were not seen during this recording.  [name], MD [facility] [phone]  If 7pm- 7am, please page neurology on call as listed in [place].

## 2021-08-05 NOTE — Progress Notes (Signed)
NAME:  Austin Cobb, MRN:  [id number], DOB:  28-Oct-1960, LOS: 67 ADMISSION DATE:  07/31/2021, CONSULTATION DATE:  07/28/21 REFERRING MD:  Pasty Arch, CHIEF COMPLAINT:  Cardiogenic shock   History of Present Illness:  Mr. Penkala is a 61 y/o gentleman with a history of ETOH & tobacco abuse, HFrEF, CKD 3b, bipolar disorder who presented on 5/13 with CP, lightheadedness, n/v. He also has odynophagia with pain in his chest after swallowing. He had an episode of hematemesis the night before presentation.  He was found to have Aflutter with RVR and has been managed with beta blockers. Not felt to be a good candidate previously for DCCV due to esophageal pain and questionable compliance after discharge with DOAC. He underwent multiple cardioversions in 2019. He has been taken off Entresto due to AKI. He developed hypotension on the floor & he was moved to the ICU due to concern for need for vasopressors for decompensated heart failure.    He drinks 2 x 40 oz beers per day usually, more the few days PTA. Last drink 24 hrs before admission.  He was initially treated with a  librium taper + CIWA but has not needed benzodiazepines recently.  Pertinent  Medical History  ETOH abuse HFrEF Bipolar disorder GERD Tobacco abuse GERD  Significant Hospital Events: Including procedures, antibiotic start and stop dates in addition to other pertinent events   5/13 admitted 5/20 moved to ICU 5/21 Babbling nonsensically, moist speech/gurgling, mitts in place, refusing NTS 5/24 Continues to remain encephalopathic with thick upper airway secretions auscultated   5/25 Remains intubated with abundant MRSA on sputum culture  5/26 ETT exchange for cuff leak   Interim History / Subjective:  No events, seems more restless without precedex.  Objective   Blood pressure 139/82, pulse (!) 113, temperature 98.4 F (36.9 C), resp. rate 15, height '5\' 9"'$  (1.753 m), weight 71.3 kg, SpO2 98 %.    Vent Mode: PRVC FiO2  (%):  [40 %] 40 % Set Rate:  [16 bmp] 16 bmp Vt Set:  [580 mL] 580 mL PEEP:  [5 cmH20] 5 cmH20 Plateau Pressure:  [13 cmH20-20 cmH20] 13 cmH20   Intake/Output Summary (Last 24 hours) at 08/05/2021 0932 Last data filed at 08/05/2021 0400 Gross per 24 hour  Intake 1439.18 ml  Output 995 ml  Net 444.18 ml    Filed Weights   08/02/21 0500 08/03/21 0500 08/05/21 0500  Weight: 57.8 kg 61.4 kg 71.3 kg    Examination: Malodorous, chronically ill appearing Poor dentition No distress ETT with ongoing copious secretions Still intermittent cuff leak Ext warm, +muscle wasting Abdomen soft  BMP and CBC stable  Resolved Hospital Problem list   AKI on CKD 3b Hypernatremia, Hypokalemia, Hypomagnesemia   Assessment & Plan:  Acute Hypoxic Respiratory Failure secondary to MRSA tracheobronchitis and poor secretion clearance P: IV linezolid to complete 8 day course, may need longer given secretion burden checking another tracheal aspirate today Vent/VAP prevention bundle See GOC discussion below  Acute on chronic HFrEF due to tachyarrhythmia A-flutter with RVR- resolved Elevated Troponin 2/2 Myocardial Injury due to AFlutter -Cardiology evaluated and continues to recommend medical management due to fragility P: PO amio as ordered Continue BB Continue eliquis  ETOH withdrawal with Dts Acute metabolic encephalopathy. Question developing EtOH dementia P: Completed phenobarbital taper  Seizure precautions Supplement thiamine, folate and MVI   Precedex/prop/fent titrated to RASS -1 and vent synchrony  Severe protein energy malnutrition P: Continue TF via [place]rack  CKD 3b  P: Follow renal function  Monitor urine output Trend Bmet Avoid nephrotoxins Ensure adequate renal perfusion   Tobacco abuse Polysubstance / ETOH Abuse  P: Continue nicotine patch   GERD, possibly esophagitis P: PPI and Carafate   GOC- Palliative to speak with estranged family tomorrow.  If they do  not think he would benefit from hospice care (which is most appropriate in his current clinical state), will try to wean to extubate and see if we can get his views on how to move forward.   If he remains encephalopathic and unable to make this decision we will be in a tough spot.  Best Practice (right click and "Reselect all SmartList Selections" daily)  Diet/type: tubefeeds DVT prophylaxis: DOAC GI prophylaxis: PPI Lines: N/A Foley:  N/A Code Status:  full code Last date of multidisciplinary goals of care discussion: See above  Critical care time: 33 min  Erskine Emery MD [facility] Personal contact information can be found on Amion  08/05/2021, 7:22 AM

## 2021-08-06 ENCOUNTER — Inpatient Hospital Stay (HOSPITAL_COMMUNITY): Payer: Medicare (Managed Care)

## 2021-08-06 DIAGNOSIS — Z515 Encounter for palliative care: Secondary | ICD-10-CM | POA: Diagnosis not present

## 2021-08-06 DIAGNOSIS — Z7189 Other specified counseling: Secondary | ICD-10-CM | POA: Diagnosis not present

## 2021-08-06 DIAGNOSIS — Z978 Presence of other specified devices: Secondary | ICD-10-CM | POA: Diagnosis not present

## 2021-08-06 DIAGNOSIS — I5022 Chronic systolic (congestive) heart failure: Secondary | ICD-10-CM | POA: Diagnosis not present

## 2021-08-06 DIAGNOSIS — J9601 Acute respiratory failure with hypoxia: Secondary | ICD-10-CM | POA: Diagnosis not present

## 2021-08-06 DIAGNOSIS — I483 Typical atrial flutter: Secondary | ICD-10-CM | POA: Diagnosis not present

## 2021-08-06 DIAGNOSIS — F10931 Alcohol use, unspecified with withdrawal delirium: Secondary | ICD-10-CM | POA: Diagnosis not present

## 2021-08-06 LAB — BASIC METABOLIC PANEL
Anion gap: 8 (ref 5–15)
BUN: 11 mg/dL (ref 8–23)
CO2: 26 mmol/L (ref 22–32)
Calcium: 8.1 mg/dL — ABNORMAL LOW (ref 8.9–10.3)
Chloride: 100 mmol/L (ref 98–111)
Creatinine, Ser: 0.78 mg/dL (ref 0.61–1.24)
GFR, Estimated: >60 mL/min (ref >60)
Glucose, Bld: 135 mg/dL — ABNORMAL HIGH (ref 70–99)
Potassium: 4.4 mmol/L (ref 3.5–5.1)
Sodium: 134 mmol/L — ABNORMAL LOW (ref 135–145)

## 2021-08-06 LAB — CBC
HCT: 30.2 % — ABNORMAL LOW (ref 39.0–52.0)
Hemoglobin: 10 g/dL — ABNORMAL LOW (ref 13.0–17.0)
MCH: 31.3 pg (ref 26.0–34.0)
MCHC: 33.1 g/dL (ref 30.0–36.0)
MCV: 94.7 fL (ref 80.0–100.0)
Platelets: 189 10*3/uL (ref 150–400)
RBC: 3.19 MIL/uL — ABNORMAL LOW (ref 4.22–5.81)
RDW: 14.2 % (ref 11.5–15.5)
WBC: 8.3 10*3/uL (ref 4.0–10.5)
nRBC: 0 % (ref 0.0–0.2)

## 2021-08-06 LAB — GLUCOSE, CAPILLARY
Glucose-Capillary: 155 mg/dL — ABNORMAL HIGH (ref 70–99)
Glucose-Capillary: 122 mg/dL — ABNORMAL HIGH (ref 70–99)
Glucose-Capillary: 150 mg/dL — ABNORMAL HIGH (ref 70–99)
Glucose-Capillary: 133 mg/dL — ABNORMAL HIGH (ref 70–99)
Glucose-Capillary: 154 mg/dL — ABNORMAL HIGH (ref 70–99)
Glucose-Capillary: 106 mg/dL — ABNORMAL HIGH (ref 70–99)

## 2021-08-06 LAB — ZINC: Zinc: 45 ug/dL (ref 44–115)

## 2021-08-06 LAB — COPPER, SERUM: Copper: 89 ug/dL (ref 69–132)

## 2021-08-06 MED ORDER — PROPOFOL 1000 MG/100ML IV EMUL
5.0000 ug/kg/min | INTRAVENOUS | Status: DC
  Administered 2021-08-06 (×2): 25 ug/kg/min via INTRAVENOUS
  Administered 2021-08-06: 35 ug/kg/min via INTRAVENOUS
  Administered 2021-08-07: 25 ug/kg/min via INTRAVENOUS
  Filled 2021-08-06 (×2): qty 100

## 2021-08-06 MED ORDER — QUETIAPINE FUMARATE 25 MG PO TABS
25.0000 mg | ORAL_TABLET | Freq: Every day | ORAL | Status: DC
Start: 2021-08-06 — End: 2021-08-07
  Administered 2021-08-06: 25 mg
  Filled 2021-08-06: qty 1

## 2021-08-06 MED ORDER — METOPROLOL TARTRATE 5 MG/5ML IV SOLN
2.5000 mg | Freq: Four times a day (QID) | INTRAVENOUS | Status: DC
  Administered 2021-08-06 – 2021-08-07 (×3): 5 mg via INTRAVENOUS
  Filled 2021-08-06 (×3): qty 5

## 2021-08-06 MED ORDER — METOPROLOL TARTRATE 25 MG/10 ML ORAL SUSPENSION
50.0000 mg | Freq: Two times a day (BID) | ORAL | Status: DC
Start: 2021-08-06 — End: 2021-08-07
  Administered 2021-08-06 – 2021-08-07 (×2): 50 mg
  Filled 2021-08-06 (×2): qty 20

## 2021-08-06 NOTE — Progress Notes (Signed)
Some increasing HTN this evening. Did not improve with incr sedation & RASS wouldn't suggest particularly uncomfortable  Incr sch metop to '50mg'$  BID (from 25 BID) Adding PRN metop    Eliseo Gum MSN, AGACNP-BC [facility] 08/06/2021, 6:57 PM

## 2021-08-06 NOTE — Progress Notes (Signed)
Patient ID: NICOLA QUESNELL, male   DOB: Aug 04, 1960, 61 y.o.   MRN: [id number]    Progress Note from the Palliative Medicine Team at [facility]   Patient Name: Austin Cobb        Date: 08/06/2021 DOB: 1960/03/19  Age: 61 y.o. MRN#: [id number] Attending Physician: Candee Furbish, MD Primary Care Physician: Patient, No Pcp Per (Inactive) Admit Date: 07/13/2021   Medical records reviewed   Per hospitalist note --> [name] is a 61 y/o gentleman with a history of ETOH & tobacco abuse, HFrEF, CKD 3b, bipolar disorder who presented on 5/13 with CP, lightheadedness, n/v. He also has odynophagia with pain in his chest after swallowing. He had an episode of hematemesis the night before presentation.  He was found to have Aflutter with RVR and has been managed with beta blockers. Not felt to be a good candidate previously for DCCV due to esophageal pain and questionable compliance after discharge with DOAC. He underwent multiple cardioversions in 2019. He has been taken off Entresto due to AKI. He developed hypotension on the floor & he was moved to the ICU due to concern for need for vasopressors for decompensated heart failure.    Palliative care working with family on discussion around goals of care.  This NP visited patient at the bedside as a follow up for palliative medicine needs and emotional support, and to speak with family as discussed regarding neck steps and plan of care.  Patient is currently on full vent support.  Continues with copious secretions.  Patient is unable to make medical decisions for himself at this time.  Patient does not have any advance care planning documents or designated H POA.  He has 2 living siblings, however at this time his brother [name] has been most reasonably available and receiving medical updates and anticipating medical decisions for this patient.  I attempted to call his sister/friend Daymon Hora several times last week and again today, I left  a message and await callback.  I have not been able to speak directly with her on these multiple attempts.  I spoke to his brother/ Lenon Ahmadi by telephone today and offered continued education regarding current medical situation.   Attempted to explore with patient's brother the values and goals of care important to [name].  Understanding that his brother would likely not appreciate any other current interventions he is leaning towards liberating the patient from the ventilator and allowing a natural death.  However at this time he would like to have his sister weigh in on that decision.  While you are on the phone we tried to call the sister again with no success  Education offered on the difference between aggressive medical intervention path and a palliative comfort path for this patient at this time in this situation.  I will attempt to call patient's sister one more time this evening, and we will follow-up with [name] in the morning.   Questions and concerns addressed   Discussed with Dr Valeta Harms  and Treatment care team   Wadie Lessen NP  Palliative Medicine Team Team Phone # [phone] Pager [phone]

## 2021-08-06 NOTE — Progress Notes (Signed)
Remains in atrial flutter, ventricular rate in 70s when deeply sedated, 105 bpm most of the time. Remains intubated and sedated and responds only to intense stimulation. Not on pressors. 9 liters +ve since admission. CXR suggests hypervolemia. Normal renal parameters. Probably needs diuresis. Please re-consult Cardiology if needed.

## 2021-08-06 NOTE — Progress Notes (Signed)
NAME:  Austin Cobb, MRN:  [id number], DOB:  1961-03-04, LOS: 44 ADMISSION DATE:  07/31/2021, CONSULTATION DATE:  07/28/21 REFERRING MD:  Pasty Arch, CHIEF COMPLAINT:  Cardiogenic shock   History of Present Illness:  [name] is a 61 y/o gentleman with a history of ETOH & tobacco abuse, HFrEF, CKD 3b, bipolar disorder who presented on 5/13 with CP, lightheadedness, n/v. He also has odynophagia with pain in his chest after swallowing. He had an episode of hematemesis the night before presentation.  He was found to have Aflutter with RVR and has been managed with beta blockers. Not felt to be a good candidate previously for DCCV due to esophageal pain and questionable compliance after discharge with DOAC. He underwent multiple cardioversions in 2019. He has been taken off Entresto due to AKI. He developed hypotension on the floor & he was moved to the ICU due to concern for need for vasopressors for decompensated heart failure.    He drinks 2 x 40 oz beers per day usually, more the few days PTA. Last drink 24 hrs before admission.  He was initially treated with a  librium taper + CIWA but has not needed benzodiazepines recently.  Pertinent  Medical History  ETOH abuse HFrEF Bipolar disorder GERD Tobacco abuse GERD  Significant Hospital Events: Including procedures, antibiotic start and stop dates in addition to other pertinent events   5/13 admitted 5/20 moved to ICU 5/21 Babbling nonsensically, moist speech/gurgling, mitts in place, refusing NTS 5/24 Continues to remain encephalopathic with thick upper airway secretions auscultated   5/25 Remains intubated with abundant MRSA on sputum culture  5/26 ETT exchange for cuff leak 5/29 SBT. Ongoing thick secretions    Interim History / Subjective:  NAEO   Plan for family mtg today with palliative   Objective   Blood pressure 126/87, pulse (!) 109, temperature 99.1 F (37.3 C), resp. rate (!) 23, height '5\' 9"'$  (1.753 m), weight  64.6 kg, SpO2 100 %.    Vent Mode: PRVC FiO2 (%):  [40 %] 40 % Set Rate:  [16 bmp] 16 bmp Vt Set:  [580 mL] 580 mL PEEP:  [5 cmH20] 5 cmH20 Plateau Pressure:  [13 cmH20-24 cmH20] 24 cmH20   Intake/Output Summary (Last 24 hours) at 08/06/2021 6433 Last data filed at 08/06/2021 0600 Gross per 24 hour  Intake 3787.21 ml  Output 1600 ml  Net 2187.21 ml   Filed Weights   08/03/21 0500 08/05/21 0500 08/06/21 0500  Weight: 61.4 kg 71.3 kg 64.6 kg    Examination: General: chronically and critically ill middle aged M sedated NAD HEENT: NCAT poor dentition. Halitosis. ETT secure  Neuro: Sedated . Moving purposefully. Does not follow commands. NAD. Pulm: Even unlabored on PSV. Thick tenacious secretions.  CV: tachycardic. S1s2 cap refill brisk  GI: soft slightly distended. + bowel sounds. + FMS  MSK: symmetrically decreased muscle mass. No acute joint deformity Skin: warm, dry   Resolved Hospital Problem list   AKI on CKD 3b Hypernatremia, Hypokalemia, Hypomagnesemia   Assessment & Plan:   Acute metabolic encephalopathy, question superimposed on underlying/developing etoh dementia Etoh withdrawal with Dts -s/p phenobarb taper P: -cont micronutrient supplementastion -wean sedation for RASS 0. Prop off. Wean fent gtt precedex gtt.  - will add enteral qhs seroquel -lights on, delirium precautions   Acute hypoxic respiratory failure 2/2 MRSA tracheobronchitis  Small bilateral pleural effusion P: -cont linezolid -- secretion burden not yet improving, may need longer than usual course of abx -follow  repeat rach asp sent 5/28 though for now looks like yeast   Acute on chronic HFrEF Aflutter  -hx HIT P: -amio, metop, eliquis   CKD3b P: -follow UOP, renal fxn  Tobacco abuse Polysubstance / ETOH Abuse  P: -nicotine patch -supportive care   GERD, possibly esophagitis P: PPI and Carafate   Severe protein calorie malnutrition  P: -En per RDN   GOC- Palliative mtg with  family (estranged) 5/29  -trying to wean sedation to assess mentation, has been difficult with agitation leading to resp compromise past few days. Does not require significant vent support, bigger question is of secretion management if airway not secure.  -depending on family's insight about what is most important to the pt QOL, recommend discussing palliative focus and transition to comfort care vs 1 way extubation + DNR, transition to comfort if declines. If ongoing aggressive care is pursued, would suggest time limited trial.   Best Practice (right click and "Reselect all SmartList Selections" daily)  Diet/type: tubefeeds DVT prophylaxis: DOAC GI prophylaxis: PPI Lines: N/A Foley:  Yes, and it is still needed Code Status:  full code Last date of multidisciplinary goals of care discussion: Pending 5/29   CRITICAL CARE Performed by: Cristal Generous  Total critical care time: 42 minutes  Critical care time was exclusive of separately billable procedures and treating other patients. Critical care was necessary to treat or prevent imminent or life-threatening deterioration.  Critical care was time spent personally by me on the following activities: development of treatment plan with patient and/or surrogate as well as nursing, discussions with consultants, evaluation of patient's response to treatment, examination of patient, obtaining history from patient or surrogate, ordering and performing treatments and interventions, ordering and review of laboratory studies, ordering and review of radiographic studies, pulse oximetry and re-evaluation of patient's condition.  Eliseo Gum MSN, AGACNP-BC [facility] for pager 08/06/2021, 9:27 AM

## 2021-08-07 DIAGNOSIS — Z978 Presence of other specified devices: Secondary | ICD-10-CM | POA: Diagnosis not present

## 2021-08-07 DIAGNOSIS — N179 Acute kidney failure, unspecified: Secondary | ICD-10-CM | POA: Diagnosis not present

## 2021-08-07 DIAGNOSIS — Z7189 Other specified counseling: Secondary | ICD-10-CM | POA: Diagnosis not present

## 2021-08-07 DIAGNOSIS — F10931 Alcohol use, unspecified with withdrawal delirium: Secondary | ICD-10-CM | POA: Diagnosis not present

## 2021-08-07 DIAGNOSIS — R0600 Dyspnea, unspecified: Secondary | ICD-10-CM

## 2021-08-07 DIAGNOSIS — E876 Hypokalemia: Secondary | ICD-10-CM | POA: Diagnosis not present

## 2021-08-07 DIAGNOSIS — J9600 Acute respiratory failure, unspecified whether with hypoxia or hypercapnia: Secondary | ICD-10-CM

## 2021-08-07 DIAGNOSIS — J9601 Acute respiratory failure with hypoxia: Secondary | ICD-10-CM | POA: Diagnosis not present

## 2021-08-07 DIAGNOSIS — R410 Disorientation, unspecified: Secondary | ICD-10-CM

## 2021-08-07 DIAGNOSIS — J15212 Pneumonia due to Methicillin resistant Staphylococcus aureus: Secondary | ICD-10-CM

## 2021-08-07 DIAGNOSIS — Z515 Encounter for palliative care: Secondary | ICD-10-CM | POA: Diagnosis not present

## 2021-08-07 LAB — BASIC METABOLIC PANEL
Anion gap: 9 (ref 5–15)
BUN: 12 mg/dL (ref 8–23)
CO2: 26 mmol/L (ref 22–32)
Calcium: 8 mg/dL — ABNORMAL LOW (ref 8.9–10.3)
Chloride: 98 mmol/L (ref 98–111)
Creatinine, Ser: 0.74 mg/dL (ref 0.61–1.24)
GFR, Estimated: >60 mL/min (ref >60)
Glucose, Bld: 122 mg/dL — ABNORMAL HIGH (ref 70–99)
Potassium: 4.9 mmol/L (ref 3.5–5.1)
Sodium: 133 mmol/L — ABNORMAL LOW (ref 135–145)

## 2021-08-07 LAB — CBC
HCT: 29.9 % — ABNORMAL LOW (ref 39.0–52.0)
Hemoglobin: 9.6 g/dL — ABNORMAL LOW (ref 13.0–17.0)
MCH: 31.9 pg (ref 26.0–34.0)
MCHC: 32.1 g/dL (ref 30.0–36.0)
MCV: 99.3 fL (ref 80.0–100.0)
Platelets: 159 10*3/uL (ref 150–400)
RBC: 3.01 MIL/uL — ABNORMAL LOW (ref 4.22–5.81)
RDW: 14.4 % (ref 11.5–15.5)
WBC: 7.8 10*3/uL (ref 4.0–10.5)
nRBC: 0 % (ref 0.0–0.2)

## 2021-08-07 LAB — GLUCOSE, CAPILLARY
Glucose-Capillary: 124 mg/dL — ABNORMAL HIGH (ref 70–99)
Glucose-Capillary: 148 mg/dL — ABNORMAL HIGH (ref 70–99)
Glucose-Capillary: 115 mg/dL — ABNORMAL HIGH (ref 70–99)

## 2021-08-07 MED ORDER — MORPHINE SULFATE (PF) 2 MG/ML IV SOLN: 2.0000 mg | INTRAVENOUS | Status: DC | PRN

## 2021-08-07 MED ORDER — GLYCOPYRROLATE 0.2 MG/ML IJ SOLN
0.2000 mg | INTRAMUSCULAR | Status: DC | PRN
  Administered 2021-08-07: 0.2 mg via INTRAVENOUS

## 2021-08-07 MED ORDER — GLYCOPYRROLATE 0.2 MG/ML IJ SOLN
0.2000 mg | INTRAMUSCULAR | Status: DC | PRN
  Filled 2021-08-07: qty 1

## 2021-08-07 MED ORDER — LORAZEPAM 2 MG/ML IJ SOLN
2.0000 mg | INTRAMUSCULAR | Status: DC | PRN
  Administered 2021-08-07: 4 mg via INTRAVENOUS
  Filled 2021-08-07: qty 2

## 2021-08-07 MED ORDER — ACETAMINOPHEN 650 MG RE SUPP: 650.0000 mg | Freq: Four times a day (QID) | RECTAL | Status: DC | PRN

## 2021-08-07 MED ORDER — POLYVINYL ALCOHOL 1.4 % OP SOLN: 1.0000 [drp] | Freq: Four times a day (QID) | OPHTHALMIC | Status: DC | PRN

## 2021-08-07 MED ORDER — ACETAMINOPHEN 325 MG PO TABS: 650.0000 mg | ORAL_TABLET | Freq: Four times a day (QID) | ORAL | Status: DC | PRN

## 2021-08-07 MED ORDER — GLYCOPYRROLATE 1 MG PO TABS: 1.0000 mg | ORAL_TABLET | ORAL | Status: DC | PRN

## 2021-08-07 MED ORDER — QUETIAPINE FUMARATE 50 MG PO TABS
50.0000 mg | ORAL_TABLET | Freq: Two times a day (BID) | ORAL | Status: DC
Start: 2021-08-07 — End: 2021-08-07

## 2021-08-07 MED ORDER — MORPHINE BOLUS VIA INFUSION
5.0000 mg | INTRAVENOUS | Status: DC | PRN
  Administered 2021-08-07 (×2): 5 mg via INTRAVENOUS

## 2021-08-07 MED ORDER — MORPHINE 100MG IN NS 100ML (1MG/ML) PREMIX INFUSION
0.0000 mg/h | INTRAVENOUS | Status: DC
  Administered 2021-08-07: 5 mg/h via INTRAVENOUS
  Filled 2021-08-07: qty 100

## 2021-08-07 MED ORDER — DIPHENHYDRAMINE HCL 50 MG/ML IJ SOLN: 25.0000 mg | INTRAMUSCULAR | Status: DC | PRN

## 2021-08-07 MED ORDER — DEXTROSE 5 % IV SOLN
INTRAVENOUS | Status: DC
Start: 2021-08-07 — End: 2021-08-07

## 2021-08-07 MED ORDER — QUETIAPINE FUMARATE 50 MG PO TABS
50.0000 mg | ORAL_TABLET | Freq: Two times a day (BID) | ORAL | Status: DC
  Administered 2021-08-07: 50 mg
  Filled 2021-08-07: qty 1

## 2021-08-08 LAB — CULTURE, RESPIRATORY W GRAM STAIN

## 2021-08-09 LAB — VITAMIN C: Vitamin C: 0.8 mg/dL (ref 0.4–2.0)

## 2021-08-09 NOTE — Progress Notes (Signed)
Patient ID: Austin Cobb, male   DOB: [birth year], 61 y.o.   MRN: [id number]    Progress Note from the Palliative Medicine Team at [facility]   Patient Name: Austin Cobb        Date: 2021-09-05 DOB: 22-May-1960  Age: 61 y.o. MRN#: [id number] Attending Physician: Jacky Kindle, MD Primary Care Physician: Patient, No Pcp Per (Inactive) Admit Date: 08/01/2021   Medical records reviewed   Per hospitalist note --> [name] is a 61 y/o gentleman with a history of ETOH & tobacco abuse, HFrEF, CKD 3b, bipolar disorder who presented on 5/13 with CP, lightheadedness, n/v. He also has odynophagia with pain in his chest after swallowing. He had an episode of hematemesis the night before presentation.  He was found to have Aflutter with RVR and has been managed with beta blockers. Not felt to be a good candidate previously for DCCV due to esophageal pain and questionable compliance after discharge with DOAC. He underwent multiple cardioversions in 2019. He has been taken off Entresto due to AKI. He developed hypotension on the floor & he was moved to the ICU due to concern for need for vasopressors for decompensated heart failure.   Significant Hospital Events: 5/13 admitted 5/20 moved to ICU 5/21 Babbling nonsensically, moist speech/gurgling, mitts in place, refusing NTS 5/24 Continues to remain encephalopathic with thick upper airway secretions auscultated   5/25 Remains intubated with abundant MRSA on sputum culture  5/26 ETT exchange for cuff leak 5/29 SBT. Ongoing thick secretions  Remains on vent, sedated, increased agitation with attempts to wean sedation    Palliative care working with family on discussion around goals of care.  This NP visited patient at the bedside as a follow up for palliative medicine needs and emotional support.  I have been working with patient's brother/Austin Cobb throughout the last week..  I have also spoken in detail with a support person Austin Cobb.    I  have attempted multiple times to contact patient's listed sister Austin Cobb; I never was able to reach her directly and never received call back after messages were left.  Today again I spoke with Austin Cobb.  Austin Cobb has been readily available; he is the next of kin who has been available for  conversations during this hospitalization,  and who is acting in good faith and can reliably convey the wishes of his brother.  Continued education regarding current medical situation.    Austin Cobb is able to verbalize his desire to shift focus of care to comfort and dignity at this time allowing for natural death.  Education offered on the difference between aggressive medical intervention path and a palliative comfort path for this patient at this time in this situation.  Education offered on the natural trajectory and expectations at end-of-life.  Prognosis for patient once shift is made to a comfort path is likely hours to days.  I also placed a phone call to Austin Cobb the patient's roommate and offered education regarding current medical situation.  She plans to visit patient today.  Questions and concerns addressed     Discussed with Austin Labrum PA-C CCM-he will place orders for one-way extubation and comfort.  Discussed recommendations.  Discussed with bedside RN.   Wadie Lessen NP  Palliative Medicine Team Team Phone # [phone] Pager [phone]

## 2021-08-09 NOTE — Progress Notes (Signed)
RT NOTE: RT extubated patient per order to comfort care.

## 2021-08-09 NOTE — Progress Notes (Signed)
30 cc morphine wasted in sharps. Berniece Salines, RN witness to waste.

## 2021-08-09 NOTE — Progress Notes (Signed)
Pt asystole. No heart sounds on ausculation. Pronounced by Griffin Dakin, RN and Berniece Salines RN. Expired at 1454.

## 2021-08-09 NOTE — Death Summary Note (Signed)
DEATH SUMMARY   Patient Details  Name: Austin Cobb MRN: [id number] DOB: 08-26-1960  Admission/Discharge Information   Admit Date:  07/16/2021  Date of Death: Date of Death: Aug 24, 2021  Time of Death: Time of Death: 07-May-1452  Length of Stay: May 13, 2022  Referring Physician: Patient, No Pcp Per (Inactive)   Reason(s) for Hospitalization  Acute hypoxic respiratory failure due to MRSA tracheobronchitis Bilateral small pleural effusion Alcohol withdrawal with DTs Acute septic/metabolic encephalopathy in the setting of alcohol-related dementia Acute on chronic HFrEF Chronic atrial flutter Polysubstance abuse Severe protein calorie malnutrition AKI on CKD 3b due to septic ATN Hypernatremia, Hypokalemia, Hypomagnesemia   Diagnoses  Preliminary cause of death:   Withdrawal of care due to persistent encephalopathy with a background of alcohol-related dementia and MRSA pneumonia Secondary Diagnoses (including complications and co-morbidities):  Principal Problem:   Alcohol withdrawal ([place]) Active Problems:   AKI (acute kidney injury) ([place])   Hypokalemia   Other dysphagia   Atypical chest pain   Acute respiratory failure ([place])   Delirium   Pneumonia due to methicillin resistant Staphylococcus aureus (MRSA) [facility])   Brief Hospital Course (including significant findings, care, treatment, and services provided and events leading to death)  Austin Cobb is a 61 y.o. year old male  with a history of ETOH & tobacco abuse, HFrEF, CKD 3b, bipolar disorder who presented on 08-08-2022 with CP, lightheadedness, n/v. He also has odynophagia with pain in his chest after swallowing. He had an episode of hematemesis the night before presentation.  He was found to have Aflutter with RVR and has been managed with beta blockers. Not felt to be a good candidate previously for DCCV due to esophageal pain and questionable compliance after discharge with DOAC. He underwent multiple cardioversions in 05/07/2017. He has been  taken off Entresto due to AKI. He developed hypotension on the floor & he was moved to the ICU due to concern for need for vasopressors for decompensated heart failure.    He drinks 2 x 40 oz beers per day usually, more the few days PTA. Last drink 24 hrs before admission.  He was initially treated with a  librium taper + CIWA but has not needed benzodiazepines recently  Patient continued to withdrawal from alcohol became altered and hypoxic, moved to ICU on 5/20, he remained encephalopathic, refusing suctioning he was continued on IV antibiotics for MRSA pneumonia.  Because of hypoxia he was intubated on 5/25, he failed multiple weaning trials remained encephalopathic without improvement.  Palliative care consult was requested, after goals of care discussion patient's family decided to keep him DNR/DNI and proceed with comfort care and palliative extubation.  Patient was palliatively extubated on 2022-08-25 he was made comfortable and passed on 2021/08/24 at 2:54 PM.  Patient family was informed  Pertinent Labs and Studies  Significant Diagnostic Studies CT HEAD WO CONTRAST (5MM)  Result Date: 07/26/2021 CLINICAL DATA:  Mental status change, unknown cause EXAM: CT HEAD WITHOUT CONTRAST TECHNIQUE: Contiguous axial images were obtained from the base of the skull through the vertex without intravenous contrast. RADIATION DOSE REDUCTION: This exam was performed according to the departmental dose-optimization program which includes automated exposure control, adjustment of the mA and/or kV according to patient size and/or use of iterative reconstruction technique. COMPARISON:  None Available. FINDINGS: Brain: No evidence of acute infarction, hemorrhage, hydrocephalus, extra-axial collection or mass lesion/mass effect. Patchy low-density changes within the periventricular and subcortical white matter compatible with chronic microvascular ischemic change. Mild diffuse cerebral volume loss.  Vascular: No hyperdense  vessel or unexpected calcification. Skull: Normal. Negative for fracture or focal lesion. Sinuses/Orbits: Paranasal sinus mucosal thickening, most pronounced within the right maxillary and right sphenoid sinuses. Mastoid air cells are clear. Other: None. IMPRESSION: 1. No acute intracranial abnormality. 2. Chronic microvascular ischemic change and cerebral volume loss. 3. Paranasal sinus disease. Electronically Signed   By: Davina Poke D.O.   On: 07/26/2021 16:15   US RENAL  Result Date: 07/30/2021 CLINICAL DATA:  Acute kidney insufficiency EXAM: RENAL / URINARY TRACT ULTRASOUND COMPLETE COMPARISON:  None Available. FINDINGS: Right Kidney: Renal measurements: 9.2 x 4.4 x 4.8 cm = volume: 107 mL. Echogenicity within normal limits. No mass or hydronephrosis visualized. Left Kidney: Renal measurements: 10.8 x 5 x 4.8 cm = volume: 136 mL. Echogenicity within normal limits. No mass or hydronephrosis visualized. Bladder: Appears normal for degree of bladder distention. Other: None. IMPRESSION: No abnormality identified. Electronically Signed   By: Ofilia Neas M.D.   On: 08/02/2021 14:54   DG Chest Port 1 View  Result Date: 08/06/2021 CLINICAL DATA:  ETT Cardioversion EXAM: PORTABLE CHEST - 1 VIEW COMPARISON:  08/03/2021 FINDINGS: Endotracheal tube tip located approximately 8 cm above the carina. Enteric tube extends below the left hemidiaphragm. Cardiomediastinal silhouette and pulmonary vasculature within normal limits. Mild bibasilar atelectasis. Minimal bilateral pleural effusions. Lungs otherwise clear. IMPRESSION: Minimal bibasilar atelectasis and minimal bilateral pleural effusions. Electronically Signed   By: Miachel Roux M.D.   On: 08/06/2021 07:25   DG Chest Port 1 View  Result Date: 08/03/2021 CLINICAL DATA:  NG tube placement EXAM: PORTABLE CHEST 1 VIEW COMPARISON:  Previous studies including the examination of 08/02/2021 FINDINGS: Transverse diameter of heart is increased. There are no  signs of alveolar pulmonary edema. There is crowding of markings in the lower lung fields which may be due to poor inspiration. There is increased density in the medial aspects of both lower lung fields. There is no significant pleural effusion or pneumothorax. Tip of endotracheal tube is proximally 6.7 cm above the carina. There are 2 enteric tubes traversing the esophagus with the distal portions in the stomach. IMPRESSION: Cardiomegaly. There are no signs of pulmonary edema. Increased density in the medial aspect of lower lung fields may suggest atelectasis. Other findings as described in the body of the report. Electronically Signed   By: Elmer Picker M.D.   On: 08/03/2021 17:21   DG CHEST PORT 1 VIEW  Result Date: 08/02/2021 CLINICAL DATA:  Intubation EXAM: PORTABLE CHEST 1 VIEW COMPARISON:  Chest x-ray dated Aug 01, 2021 FINDINGS: Interval intubation with ETT positioned approximately 5.0 cm from the carina. Feeding tube partially seen coursing below the diaphragm. No focal consolidation. No large pleural effusion or pneumothorax. IMPRESSION: Interval intubation with ETT positioned approximately 5.0 cm from the carina. Electronically Signed   By: Yetta Glassman M.D.   On: 08/02/2021 08:17   DG CHEST PORT 1 VIEW  Result Date: 08/01/2021 CLINICAL DATA:  Pneumonia. EXAM: PORTABLE CHEST 1 VIEW COMPARISON:  Jul 31, 2021. FINDINGS: Stable cardiomegaly. Feeding tube is seen entering stomach. Lungs are clear. Bony thorax is unremarkable. IMPRESSION: No active disease. Electronically Signed   By: Marijo Conception M.D.   On: 08/01/2021 08:06   DG CHEST PORT 1 VIEW  Result Date: 07/31/2021 CLINICAL DATA:  Alcohol withdrawal EXAM: PORTABLE CHEST 1 VIEW COMPARISON:  07/29/2021 FINDINGS: Feeding tube extends the stomach. Stable cardiac silhouette. Lungs are clear. No pneumothorax. IMPRESSION: No acute cardiopulmonary process. Electronically Signed  By: Suzy Bouchard M.D.   On: 07/31/2021 08:22   DG  Chest Port 1 View  Result Date: 07/29/2021 CLINICAL DATA:  Shortness of breath. EXAM: PORTABLE CHEST 1 VIEW COMPARISON:  07/13/2021 FINDINGS: The cardiomediastinal silhouette is unremarkable. Pulmonary vascular congestion is noted. There is no evidence of focal airspace disease, pulmonary edema, suspicious pulmonary nodule/mass, pleural effusion, or pneumothorax. No acute bony abnormalities are identified. A small bore feeding tube is noted entering the stomach with tip off the field of view. IMPRESSION: Pulmonary vascular congestion. Electronically Signed   By: Margarette Canada M.D.   On: 07/29/2021 19:03   DG Chest Port 1 View  Result Date: 08/02/2021 CLINICAL DATA:  Shortness of breath.  Chest pain. EXAM: PORTABLE CHEST 1 VIEW COMPARISON:  10/16/2020 FINDINGS: The heart size and mediastinal contours are within normal limits. Both lungs are clear. The visualized skeletal structures are unremarkable. IMPRESSION: No active disease. Electronically Signed   By: Kerby Moors M.D.   On: 07/15/2021 09:54   DG Abd Portable 1V  Result Date: 08/03/2021 CLINICAL DATA:  Enteric tube placement EXAM: PORTABLE ABDOMEN - 1 VIEW COMPARISON:  07/27/2021 FINDINGS: There is a feeding tube and nasogastric tube with the tips in the region of second portion of duodenum. There is mild dilation of small-bowel loops. Lower abdomen and pelvis are not included in the radiograph. IMPRESSION: Tips of feeding tube and NG tube are noted in the area of proximal duodenum. Electronically Signed   By: Elmer Picker M.D.   On: 08/03/2021 17:23   DG Abd Portable 1V  Result Date: 07/27/2021 CLINICAL DATA:  NG tube placement EXAM: PORTABLE ABDOMEN - 1 VIEW COMPARISON:  None Available. FINDINGS: Limited radiograph of the lower chest and upper abdomen was obtained for the purposes of enteric tube localization. Enteric tube is seen coursing below the diaphragm with distal tip terminating within the proximal duodenum. Enteric contrast is  seen throughout the small bowel. Bowel gas pattern is nonobstructive. IMPRESSION: Enteric tube terminates within the proximal duodenum. Electronically Signed   By: Davina Poke D.O.   On: 07/27/2021 16:43   DG Swallowing Func-Speech Pathology  Result Date: 07/27/2021 Table formatting from the original result was not included. Objective Swallowing Evaluation: Type of Study: MBS-Modified Barium Swallow Study  Patient Details Name: Austin Cobb MRN: [id number] Date of Birth: 31-Oct-1960 Today's Date: 07/27/2021 Time: SLP Start Time (ACUTE ONLY): 1350 -SLP Stop Time (ACUTE ONLY): 1404 SLP Time Calculation (min) (ACUTE ONLY): 14 min Past Medical History: Past Medical History: Diagnosis Date  Anxiety   Arthritis   Bipolar 1 disorder ([place])   Depression   Bipolar, not on meds/pt. stopped meds on own over 12 yrs ago  GERD (gastroesophageal reflux disease)   History of colon polyps   Noncompliance 05/14/2019  Substance abuse ([place])   52yr ago smoke crack and marjuana  Tobacco abuse 05/13/2012 Past Surgical History: Past Surgical History: Procedure Laterality Date  CARDIOVERSION N/A 05/06/2019  Procedure: CARDIOVERSION;  Surgeon: Nahser, PWonda Cheng MD;  [facility]  Service: Cardiovascular;  Laterality: N/A;  CARDIOVERSION N/A 05/20/2019  Procedure: CARDIOVERSION;  Surgeon: MLarey Dresser MD;  [facility]  Service: Cardiovascular;  Laterality: N/A;  EXAMINATION UNDER ANESTHESIA N/A 11/12/2012  Procedure: EJasmine DecemberUNDER ANESTHESIA;  Surgeon: SAdin Hector MD;  Location: [facility];  Service: General;  Laterality: N/A;  EYE SURGERY    bilateral eye sx as a child [id number].  RIGHT/LEFT HEART CATH AND CORONARY ANGIOGRAPHY N/A 05/18/2019  Procedure: RIGHT/LEFT HEART CATH AND CORONARY ANGIOGRAPHY;  Surgeon: Larey Dresser, MD;  Location: [facility];  Service: Cardiovascular;  Laterality: N/A;  SPHINCTEROTOMY N/A 11/12/2012  Procedure: SPHINCTEROTOMY;  Surgeon: Adin Hector, MD;  Location: [facility];   Service: General;  Laterality: N/A;  TEE WITHOUT CARDIOVERSION N/A 05/06/2019  Procedure: TRANSESOPHAGEAL ECHOCARDIOGRAM (TEE);  Surgeon: Acie Fredrickson Wonda Cheng, MD;  Location: [facility];  Service: Cardiovascular;  Laterality: N/A;  TEE WITHOUT CARDIOVERSION N/A 05/20/2019  Procedure: TRANSESOPHAGEAL ECHOCARDIOGRAM (TEE);  Surgeon: Larey Dresser, MD;  Location: [facility];  Service: Cardiovascular;  Laterality: N/A;  TONSILLECTOMY   HPI: Austin Cobb is a 61 y.o. male with past medical history of GERD, substance abuse, combined systolic and diastolic heart failure,  HTN, atrial flutter, CKD stage IIIb, bipolar disorder, and heparin-induced thrombocytopenia who presented to the ED for evaluation for 5 days of chest pain, nausea, vomiting, lightheadedness, and fatigue. Stated to MD has pain in his esophagus with swallowing that he feels is related to his chest pain. Currentl alcohol abuse per H & P.  No data recorded  Recommendations for follow up therapy are one component of a multi-disciplinary discharge planning process, led by the attending physician.  Recommendations may be updated based on patient status, additional functional criteria and insurance authorization. Assessment / Plan / Recommendation   07/27/2021   2:58 PM Clinical Impressions Clinical Impression Pt is deconditioned with periods of mild confusion and pharyngeal congestion and vocal quality wetness at baseline. He has what appears to be cervical osteophytes and cervical kyphosis which contribute to his dysphagia. He exhibited mild-moderate oropharyngeal dysphagia with impairments in oral cohesion, timeliness of transit and lingual residue. Swallow onset is intermittently delayed to his valleculae from 5-8 seconds before swallow triggered. Weakness of pharyngeal contraction led to pharyngeal residue in valleculae and pyriform sinues unable to clear with cues for dry swallows. Timing of laryngeal closure was delayed resulting in penetration with  nectar thick. Barium seen starting to spill over interarytenoid space and as pt was at rest a large amount of residue in pyriform sinuses fell and was aspirated into airway. He had an immediate cough response and unable to determine how much was expelled. Various consistencies trialed to reduce residue however opportunity for aspiration is increased. Given amount of residue in pyriforms and forward positioning at rest, chin tuck not attempted. Pt's clinical presentation, current deconditioning and weakness lead to recommendation of NPO status at this time with alternative means. Prognosis is good for return to po's hopefully during this hospital admission. ST will continue to work with pt on oropharyngeal swallow. SLP Visit Diagnosis Dysphagia, oropharyngeal phase (R13.12) Impact on safety and function Moderate aspiration risk     07/27/2021   2:58 PM Treatment Recommendations Treatment Recommendations Therapy as outlined in treatment plan below     07/27/2021   2:58 PM Prognosis Prognosis for Safe Diet Advancement Good Barriers to Reach Goals Cognitive deficits   07/27/2021   2:58 PM Diet Recommendations SLP Diet Recommendations NPO Medication Administration Via alternative means     07/27/2021   2:58 PM Other Recommendations Oral Care Recommendations Oral care QID Follow Up Recommendations -- Assistance recommended at discharge Frequent or constant Supervision/Assistance Functional Status Assessment Patient has had a recent decline in their functional status and demonstrates the ability to make significant improvements in function in a reasonable and predictable amount of time.   07/27/2021   2:58 PM Frequency and Duration  Speech Therapy Frequency (ACUTE ONLY) min  2x/week Treatment Duration 2 weeks     07/27/2021   2:58 PM Oral Phase Oral Phase Impaired Oral - Nectar Teaspoon Delayed oral transit Oral - Nectar Cup Lingual/palatal residue;Delayed oral transit;Weak lingual manipulation;Reduced posterior propulsion Oral -  Nectar Straw Lingual/palatal residue;Delayed oral transit;Weak lingual manipulation;Reduced posterior propulsion Oral - Thin Straw Delayed oral transit Oral - Puree Delayed oral transit Oral - Regular Weak lingual manipulation;Delayed oral transit    07/27/2021   2:58 PM Pharyngeal Phase Pharyngeal Phase Impaired Pharyngeal- Nectar Teaspoon Pharyngeal residue - valleculae;Reduced pharyngeal peristalsis;Reduced tongue base retraction Pharyngeal- Nectar Cup Delayed swallow initiation-vallecula;Pharyngeal residue - valleculae;Pharyngeal residue - pyriform;Inter-arytenoid space residue Pharyngeal- Nectar Straw Delayed swallow initiation-vallecula;Pharyngeal residue - pyriform;Pharyngeal residue - valleculae;Penetration/Apiration after swallow Pharyngeal Material enters airway, passes BELOW cords and not ejected out despite cough attempt by patient Pharyngeal- Thin Straw Pharyngeal residue - pyriform;Pharyngeal residue - valleculae Pharyngeal- Puree Pharyngeal residue - valleculae;Reduced pharyngeal peristalsis Pharyngeal- Regular Pharyngeal residue - valleculae    07/27/2021   2:58 PM Cervical Esophageal Phase  Cervical Esophageal Phase WFL [name] 07/27/2021, 3:37 PM                     EEG adult  Result Date: 08/05/2021 Derek Jack, MD     08/05/2021  8:39 PM Routine EEG Report Austin Cobb is a 61 y.o. male with a history of alcohol withdrawal who is undergoing an EEG to evaluate for seizures. Report: This EEG was acquired with electrodes placed according to the International 10-20 electrode system (including Fp1, Fp2, F3, F4, C3, C4, P3, P4, O1, O2, T3, T4, T5, T6, A1, A2, Fz, Cz, Pz). The following electrodes were missing or displaced: none. The background was diffusely slow at 5-6 Hz with frequent intervening periods of diffuse suppression lasting up to 1-2 seconds. This activity is reactive to stimulation. No sleep architecture was identified. There was no focal slowing. There were no  interictal epileptiform discharges. There were no electrographic seizures identified. Photic stimulation and hyperventilation were not performed. Impression and clinical correlation: This EEG was obtained while sedated on fentanyl and propofol and is abnormal due to moderate diffuse slowing and brief periods of intervening diffuse suppression, both indicative of global cerebral dysfunction, medication effect, or both. Epileptiform abnormalities were not seen during this recording. [name], MD [facility] [phone] If 7pm- 7am, please page neurology on call as listed in [place].   [place] EKG SITE RITE  Result Date: 07/28/2021 If Site Rite image not attached, placement could not be confirmed due to current cardiac rhythm.  US Abdomen Limited RUQ (LIVER/GB)  Result Date: 07/14/2021 CLINICAL DATA:  Cirrhosis EXAM: ULTRASOUND ABDOMEN LIMITED RIGHT UPPER QUADRANT COMPARISON:  12/25/2019 FINDINGS: Gallbladder: Gallbladder is decompressed, limiting its evaluation. No evidence of cholelithiasis or cholecystitis. Negative sonographic Murphy sign. Common bile duct: Diameter: 5 mm Liver: Coarsened liver echotexture with subtle nodularity of the liver capsule consistent with given history of cirrhosis. No change in appearance since prior study. No focal parenchymal abnormality. Portal vein is patent on color Doppler imaging with normal direction of blood flow towards the liver. Other: None. IMPRESSION: 1. Contracted gallbladder, limiting evaluation. No evidence of cholelithiasis or cholecystitis. 2. Stable subtle findings of cirrhosis.  No focal abnormalities. Electronically Signed   By: Randa Ngo M.D.   On: 07/30/2021 16:10    Microbiology Recent Results (from the past 240 hour(s))  MRSA Next Gen by PCR, Nasal     Status: Abnormal   Collection Time:  07/30/21 10:40 PM   Specimen: Nasal Mucosa; Nasal Swab  Result Value Ref Range Status   MRSA by PCR Next Gen DETECTED (A) NOT DETECTED Final     Comment: RESULT CALLED TO, READ BACK BY AND VERIFIED WITH: RN VIVIAN [id number] :07 BY TW (NOTE) The GeneXpert MRSA Assay (FDA approved for NASAL specimens only), is one component of a comprehensive MRSA colonization surveillance program. It is not intended to diagnose MRSA infection nor to guide or monitor treatment for MRSA infections. Test performance is not FDA approved in patients less than 57 years old. Performed at [facility], [place] [address] [place], [place] [address]   Culture, Respiratory w Gram Stain     Status: None   Collection Time: 07/30/21 10:40 PM   Specimen: Tracheal Aspirate  Result Value Ref Range Status   Specimen Description TRACHEAL ASPIRATE  Final   Special Requests NONE  Final   Gram Stain   Final    ABUNDANT SQUAMOUS EPITHELIAL CELLS PRESENT ABUNDANT GRAM POSITIVE COCCI FEW GRAM NEGATIVE RODS    Culture   Final    ABUNDANT METHICILLIN RESISTANT STAPHYLOCOCCUS AUREUS No Pseudomonas species isolated    Report Status 08/02/2021 FINAL  Final   Organism ID, Bacteria METHICILLIN RESISTANT STAPHYLOCOCCUS AUREUS  Final      Susceptibility   Methicillin resistant staphylococcus aureus - MIC*    CIPROFLOXACIN <=0.5 SENSITIVE Sensitive     ERYTHROMYCIN >=8 RESISTANT Resistant     GENTAMICIN <=0.5 SENSITIVE Sensitive     OXACILLIN >=4 RESISTANT Resistant     TETRACYCLINE <=1 SENSITIVE Sensitive     VANCOMYCIN <=0.5 SENSITIVE Sensitive     TRIMETH/SULFA <=10 SENSITIVE Sensitive     CLINDAMYCIN <=0.25 SENSITIVE Sensitive     RIFAMPIN <=0.5 SENSITIVE Sensitive     Inducible Clindamycin NEGATIVE Sensitive     * ABUNDANT METHICILLIN RESISTANT STAPHYLOCOCCUS AUREUS  Culture, blood (Routine X 2) w Reflex to ID Panel     Status: None   Collection Time: 07/31/21  9:43 AM   Specimen: BLOOD RIGHT FOREARM  Result Value Ref Range Status   Specimen Description BLOOD RIGHT FOREARM  Final   Special Requests   Final    BOTTLES DRAWN AEROBIC AND ANAEROBIC Blood  Culture results may not be optimal due to an inadequate volume of blood received in culture bottles   Culture   Final    NO GROWTH 5 DAYS Performed at [facility], [place] [address], [place], [place] [address]    Report Status 08/05/2021 FINAL  Final  Culture, blood (Routine X 2) w Reflex to ID Panel     Status: None   Collection Time: 07/31/21  9:44 AM   Specimen: BLOOD  Result Value Ref Range Status   Specimen Description BLOOD LEFT ANTECUBITAL  Final   Special Requests   Final    BOTTLES DRAWN AEROBIC AND ANAEROBIC Blood Culture results may not be optimal due to an inadequate volume of blood received in culture bottles   Culture   Final    NO GROWTH 5 DAYS Performed at [facility], [place] [address], [place], [place] [address]    Report Status 08/05/2021 FINAL  Final  Culture, Respiratory w Gram Stain     Status: None   Collection Time: 08/01/21  4:21 PM   Specimen: Bronchoalveolar Lavage; Respiratory  Result Value Ref Range Status   Specimen Description BRONCHIAL ALVEOLAR LAVAGE  Final   Special Requests NONE  Final   Gram  Stain   Final    FEW WBC PRESENT,BOTH PMN AND MONONUCLEAR RARE GRAM POSITIVE COCCI IN PAIRS RARE GRAM POSITIVE COCCI IN CHAINS    Culture   Final    FEW METHICILLIN RESISTANT STAPHYLOCOCCUS AUREUS No Pseudomonas species isolated Performed at [facility], [address], [place], [place] [address]    Report Status 08/03/2021 FINAL  Final   Organism ID, Bacteria METHICILLIN RESISTANT STAPHYLOCOCCUS AUREUS  Final      Susceptibility   Methicillin resistant staphylococcus aureus - MIC*    CIPROFLOXACIN <=0.5 SENSITIVE Sensitive     ERYTHROMYCIN >=8 RESISTANT Resistant     GENTAMICIN <=0.5 SENSITIVE Sensitive     OXACILLIN >=4 RESISTANT Resistant     TETRACYCLINE <=1 SENSITIVE Sensitive     VANCOMYCIN 1 SENSITIVE Sensitive     TRIMETH/SULFA <=10 SENSITIVE Sensitive     CLINDAMYCIN <=0.25 SENSITIVE Sensitive     RIFAMPIN <=0.5 SENSITIVE  Sensitive     Inducible Clindamycin NEGATIVE Sensitive     * FEW METHICILLIN RESISTANT STAPHYLOCOCCUS AUREUS  Culture, Respiratory w Gram Stain     Status: None   Collection Time: 08/05/21  7:25 AM   Specimen: Tracheal Aspirate; Respiratory  Result Value Ref Range Status   Specimen Description TRACHEAL ASPIRATE  Final   Special Requests NONE  Final   Gram Stain   Final    MODERATE WBC PRESENT, PREDOMINANTLY PMN FEW YEAST    Culture   Final    FEW CANDIDA ALBICANS RARE METHICILLIN RESISTANT STAPHYLOCOCCUS AUREUS No Pseudomonas species isolated Performed at [facility], [address] [place], [place] [address]    Report Status 08/08/2021 FINAL  Final   Organism ID, Bacteria METHICILLIN RESISTANT STAPHYLOCOCCUS AUREUS  Final      Susceptibility   Methicillin resistant staphylococcus aureus - MIC*    CIPROFLOXACIN <=0.5 SENSITIVE Sensitive     ERYTHROMYCIN >=8 RESISTANT Resistant     GENTAMICIN <=0.5 SENSITIVE Sensitive     OXACILLIN >=4 RESISTANT Resistant     TETRACYCLINE <=1 SENSITIVE Sensitive     VANCOMYCIN 1 SENSITIVE Sensitive     TRIMETH/SULFA <=10 SENSITIVE Sensitive     CLINDAMYCIN <=0.25 SENSITIVE Sensitive     RIFAMPIN <=0.5 SENSITIVE Sensitive     Inducible Clindamycin NEGATIVE Sensitive     * RARE METHICILLIN RESISTANT STAPHYLOCOCCUS AUREUS    Lab Basic Metabolic Panel: Recent Labs  Lab 08/02/21 0340 08/03/21 0738 08/04/21 0122 08/05/21 0634 08/06/21 0036 08/26/21 0230  NA 140 138 131* 135 134* 133*  K 3.9 4.9 4.5 5.1 4.4 4.9  CL 109 106 101 99 100 98  CO2 24 22 20* _0 GLUCOSE 129* 112* 110* 132* 135* 122*  BUN 24* _1 CREATININE 0.96 0.95 0.85 0.80 0.78 0.74  CALCIUM 7.9* 7.8* 7.6* 8.0* 8.1* 8.0*  MG 2.1  --   --   --   --   --    Liver Function Tests: No results for input(s): AST, ALT, ALKPHOS, BILITOT, PROT, ALBUMIN in the last 168 hours. No results for input(s): LIPASE, AMYLASE in the last 168 hours. No results for  input(s): AMMONIA in the last 168 hours. CBC: Recent Labs  Lab 08/02/21 0428 08/04/21 0122 08/05/21 0634 08/06/21 0036 08-26-2021 0230  WBC 8.3 12.0* 11.0* 8.3 7.8  HGB 10.2* 11.5* 11.3* 10.0* 9.6*  HCT 31.3* 32.7* 34.0* 30.2* 29.9*  MCV 104.3* 95.1 97.4 94.7 99.3  PLT 165 142* 199 189 159  Cardiac Enzymes: No results for input(s): CKTOTAL, CKMB, CKMBINDEX, TROPONINI in the last 168 hours. Sepsis Labs: Recent Labs  Lab 08/02/21 0340 08/02/21 0428 08/04/21 0122 08/05/21 0634 08/06/21 0036 08/15/21 0230  PROCALCITON 0.16  --   --   --   --   --   WBC  --    < > 12.0* 11.0* 8.3 7.8   < > = values in this interval not displayed.    Procedures/Operations    [employer] 08/08/2021, 1:25 PM

## 2021-08-09 NOTE — Progress Notes (Addendum)
NAME:  Austin Cobb, MRN:  [id number], DOB:  [birth year], LOS: 35 ADMISSION DATE:  08/04/2021, CONSULTATION DATE:  07/28/21 REFERRING MD:  Pasty Arch, CHIEF COMPLAINT:  Cardiogenic shock   History of Present Illness:  Austin Cobb is a 61 y/o gentleman with a history of ETOH & tobacco abuse, HFrEF, CKD 3b, bipolar disorder who presented on 5/13 with CP, lightheadedness, n/v. He also has odynophagia with pain in his chest after swallowing. He had an episode of hematemesis the night before presentation.  He was found to have Aflutter with RVR and has been managed with beta blockers. Not felt to be a good candidate previously for DCCV due to esophageal pain and questionable compliance after discharge with DOAC. He underwent multiple cardioversions in 2019. He has been taken off Entresto due to AKI. He developed hypotension on the floor & he was moved to the ICU due to concern for need for vasopressors for decompensated heart failure.    He drinks 2 x 40 oz beers per day usually, more the few days PTA. Last drink 24 hrs before admission.  He was initially treated with a  librium taper + CIWA but has not needed benzodiazepines recently.  Pertinent  Medical History  ETOH abuse HFrEF Bipolar disorder GERD Tobacco abuse GERD  Significant Hospital Events: Including procedures, antibiotic start and stop dates in addition to other pertinent events   5/13 admitted 5/20 moved to ICU 5/21 Babbling nonsensically, moist speech/gurgling, mitts in place, refusing NTS 5/24 Continues to remain encephalopathic with thick upper airway secretions auscultated   5/25 Remains intubated with abundant MRSA on sputum culture  5/26 ETT exchange for cuff leak 5/29 SBT. Ongoing thick secretions    Interim History / Subjective:  Remains on vent: PSV 10/5 40% doing well Sedated with propofol, precedex, and fentanyl According to nurse reaches for ETT and hard to wean off sedation  Objective   Blood pressure  110/65, pulse 79, temperature 99.3 F (37.4 C), resp. rate 16, height '5\' 9"'$  (1.753 m), weight 70 kg, SpO2 100 %.    Vent Mode: PSV;CPAP FiO2 (%):  [40 %] 40 % Set Rate:  [16 bmp] 16 bmp Vt Set:  [560 mL-580 mL] 560 mL PEEP:  [5 cmH20] 5 cmH20 Pressure Support:  [14 cmH20] 14 cmH20 Plateau Pressure:  [19 cmH20-26 cmH20] 21 cmH20   Intake/Output Summary (Last 24 hours) at 2021-08-29 9242 Last data filed at 08-29-2021 0700 Gross per 24 hour  Intake 5637.34 ml  Output 3620 ml  Net 2017.34 ml    Filed Weights   08/05/21 0500 08/06/21 0500 2021-08-29 0500  Weight: 71.3 kg 64.6 kg 70 kg    Examination: General:  critically ill appearing on mech vent HEENT: MM pink/moist; ETT in place Neuro: sedated; cough/gag present; MAE not on command CV: s1s2, no m/r/g PULM:  dim rhonchi BS bilaterally; on mech vent PRVC GI: soft, bsx4 active  Extremities: warm/dry, RUE warm to touch and 2+ edema compared to LUE Skin: no rashes or lesions appreciated  Resolved Hospital Problem list   AKI on CKD 3b Hypernatremia, Hypokalemia, Hypomagnesemia   Assessment & Plan:   Acute metabolic encephalopathy, question superimposed on underlying/developing etoh dementia Etoh withdrawal with Dts -s/p phenobarb taper P: -cont to wean sedation for RASS 0; goal today wean off prop and cont fent and precedex -consider increasing seroquel; check qtc -delirium precaution in place  Acute hypoxic respiratory failure 2/2 MRSA tracheobronchitis  Small bilateral pleural effusion P: -cont PSV weaning trials -  concern for extubation for thick secretions and airway protection -cpt -rest on PRVC LTVV strategy with tidal volumes of 6-8 cc/kg ideal body weight -Wean PEEP/FiO2 for SpO2 >92% -VAP bundle in place -Daily SAT and SBT -PAD protocol in place -wean sedation for RASS goal 0 -Follow intermittent CXR and ABG PRN -cont linezolid; follow repeat resp culture 5/28 -Ongoing GOC discussions regarding aggressive  care vs. comfort; palliative following  Acute on chronic HFrEF Aflutter  -hx HIT P: -telemetry monitoring -cont amio, metoprolol, and eliquis -prn metoprolol for htn  RUE edema P: -check RUE ultrasound -IV's switched to left  CKD3b P: -Trend BMP / urinary output -Replace electrolytes as indicated -Avoid nephrotoxic agents, ensure adequate renal perfusion  Tobacco abuse Polysubstance / ETOH Abuse  P: -cont nicotine patch -will need counseling when appropriate  GERD, possibly esophagitis P: - cont PPI and Carafate   Severe protein calorie malnutrition  P: -dietician following; cont TF  GOC- Palliative mtg with family (estranged) 5/29  -trying to wean sedation to assess mentation, has been difficult with agitation leading to resp compromise past few days. Does not require significant vent support, bigger question is of secretion management if airway not secure.  -depending on family's insight about what is most important to the pt QOL, recommend discussing palliative focus and transition to comfort care vs 1 way extubation + DNR, transition to comfort if declines. If ongoing aggressive care is pursued, would suggest time limited trial.  -5/29: palliative care spoke with brother Austin Cobb over phone; He states that he would like his sister to weigh in on decision regarding comfort care before he makes any final decisions.  Best Practice (right click and "Reselect all SmartList Selections" daily)  Diet/type: tubefeeds DVT prophylaxis: DOAC GI prophylaxis: PPI Lines: N/A Foley:  Yes, and it is still needed Code Status:  full code Last date of multidisciplinary goals of care discussion: 5/29: see above 5/30: pending   CRITICAL CARE Total critical care time: 40 minutes  [facility] 2021/09/05, 8:53 AM  Please see [email or web address] for pager details.  From 7A-7P if no response, please call [phone]. After hours, please call  ELink [phone].

## 2021-08-09 DEATH — deceased

## 2021-09-08 NOTE — Accreditation Note (Signed)
Restraints not reported to CMS Pursuant to regulation 482.13 (G) (3) use of soft wrist restraints was logged 08/17/2021.

## 2022-01-11 IMAGING — DX DG CHEST 1V PORT
1 series · 1 of 1 positions shown · non-contrast
Comparison: PA and lateral chest 06/16/2019.

CLINICAL DATA: Increasing shortness of breath over the past week.

EXAM:
PORTABLE CHEST 1 VIEW

[chest]
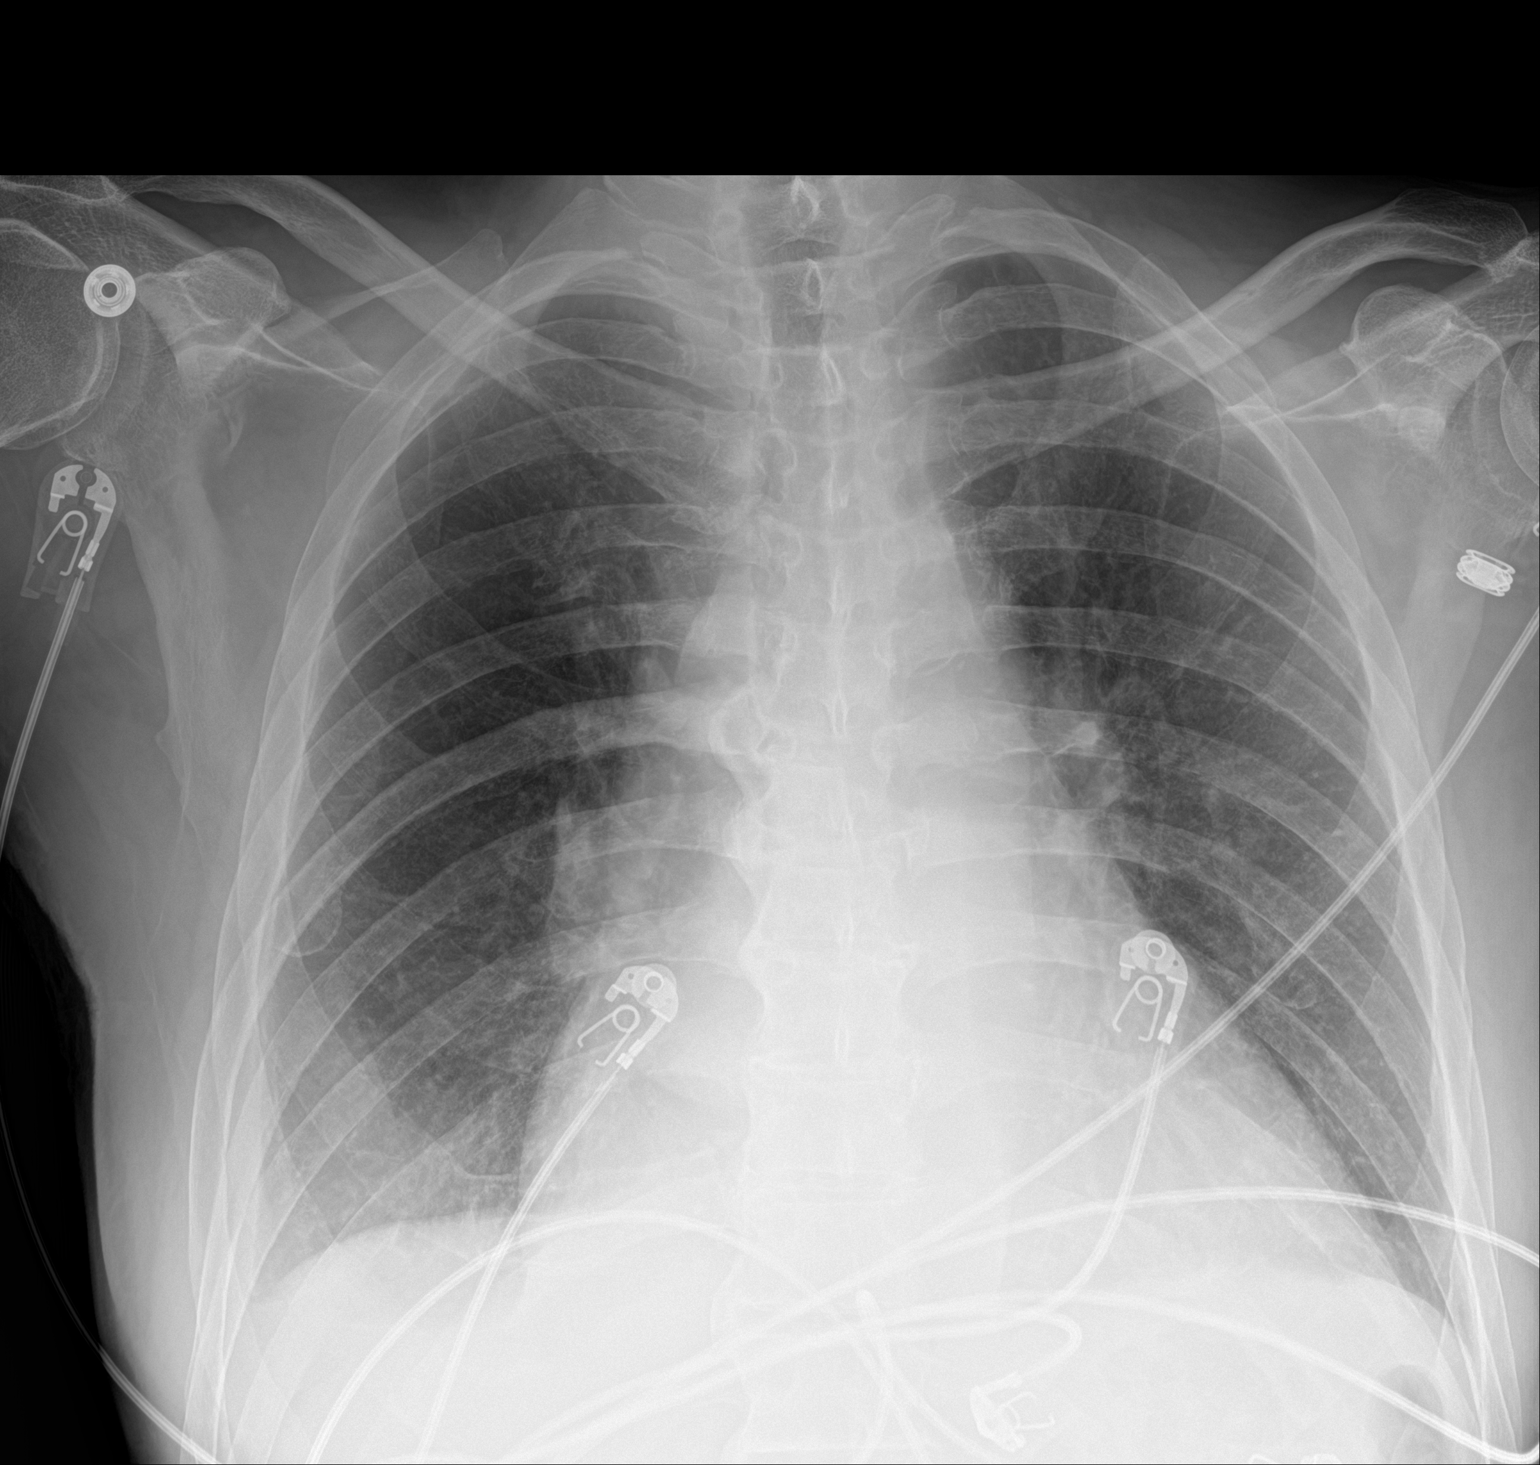

[1 of 1 positions shown; findings below may reference images not displayed]

FINDINGS: There is a small right pleural effusion and mild basilar
atelectasis. Left lung is clear. No pneumothorax. Heart size is
upper normal. No acute or focal bony abnormality.
IMPRESSION: Small right pleural effusion and basilar atelectasis are new since
the prior exam.

## 2023-08-22 IMAGING — DX DG ABD PORTABLE 1V
1 series · 1 of 1 positions shown · non-contrast
Comparison: 07/27/2021

CLINICAL DATA: Enteric tube placement

EXAM:
PORTABLE ABDOMEN - 1 VIEW

[abdomen]
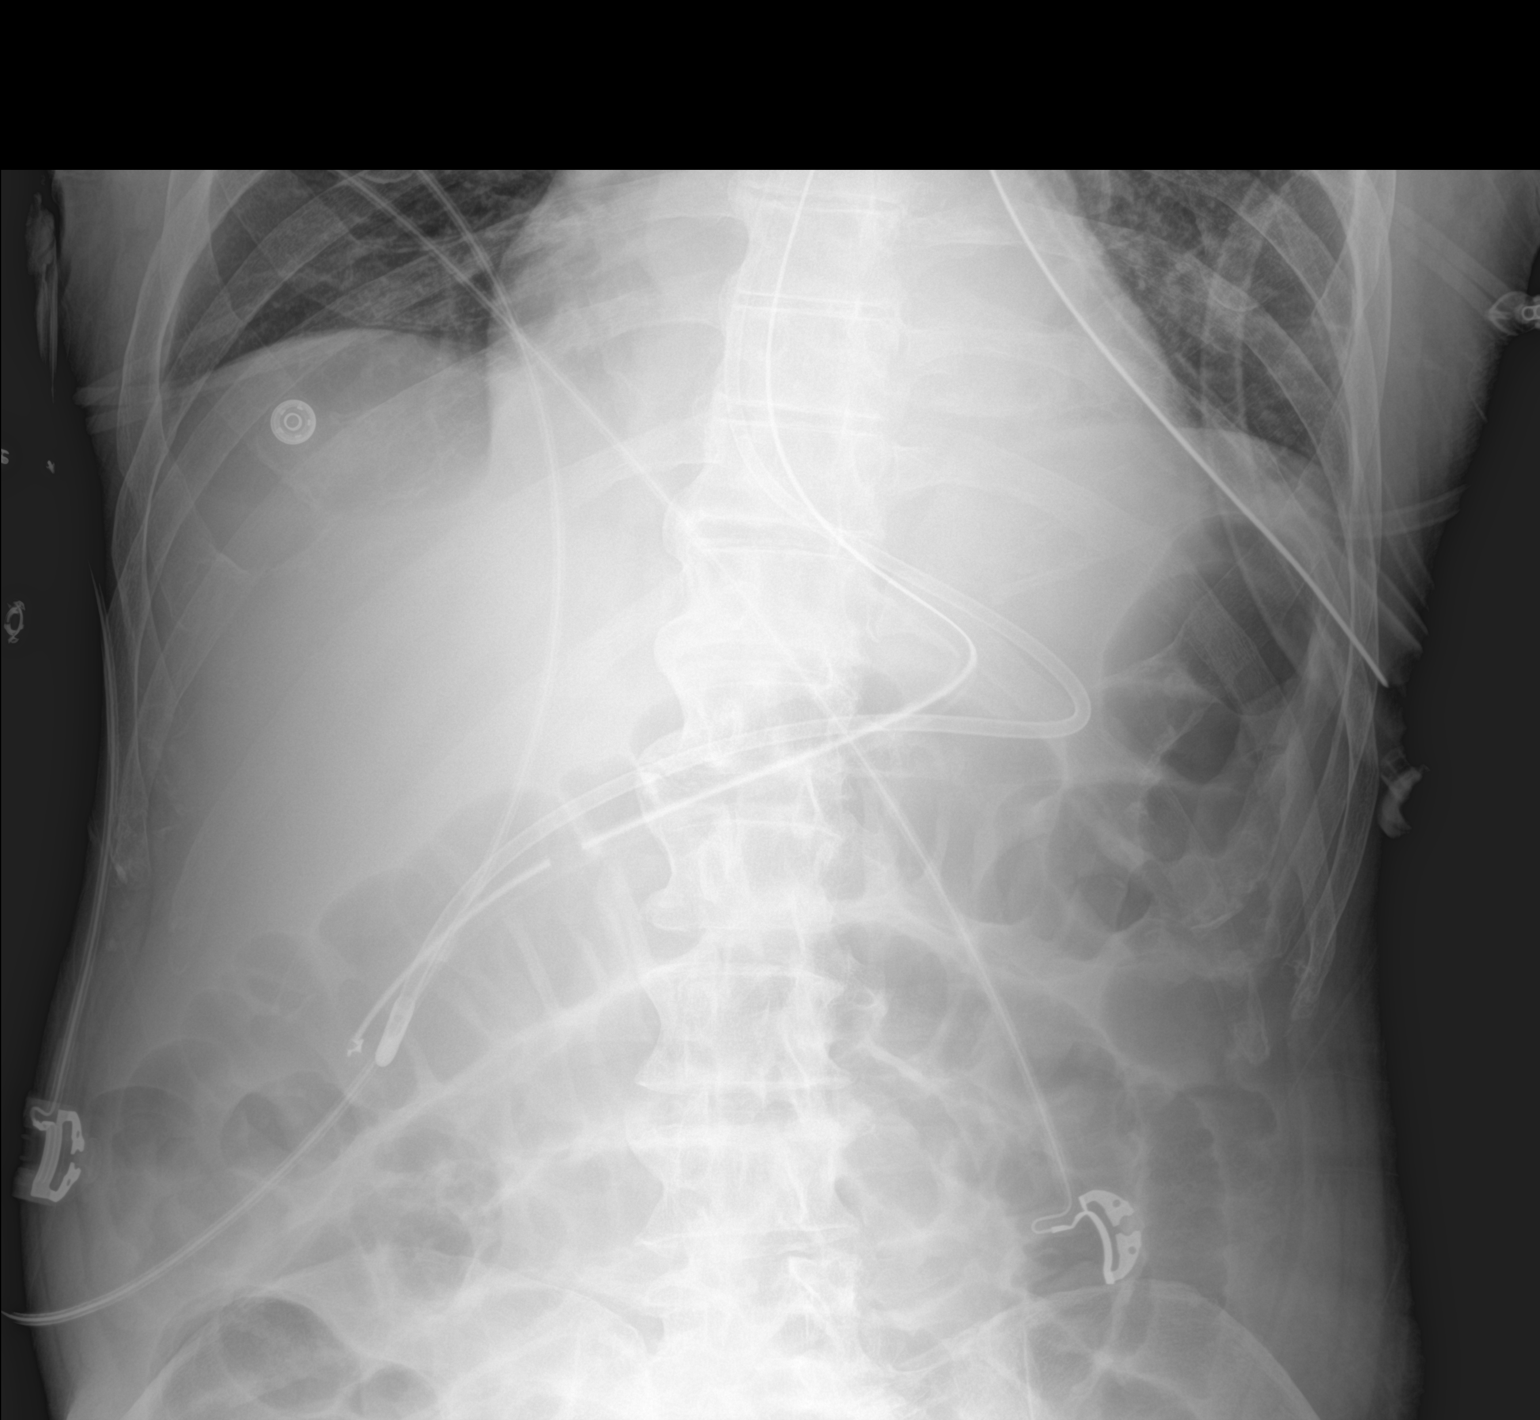

[1 of 1 positions shown; findings below may reference images not displayed]

FINDINGS: There is a feeding tube and nasogastric tube with the tips in the
region of second portion of duodenum. There is mild dilation of
small-bowel loops. Lower abdomen and pelvis are not included in the
radiograph.
IMPRESSION: Tips of feeding tube and NG tube are noted in the area of proximal
duodenum.
